# Patient Record
Sex: Male | Born: 1935
Health system: Southern US, Community
[De-identification: ages and names within clinical notes are randomized; demographics above are authoritative.]

## PROBLEM LIST (undated history)

## (undated) DIAGNOSIS — E079 Disorder of thyroid, unspecified: Secondary | ICD-10-CM

## (undated) DIAGNOSIS — K219 Gastro-esophageal reflux disease without esophagitis: Secondary | ICD-10-CM

## (undated) DIAGNOSIS — I4891 Unspecified atrial fibrillation: Secondary | ICD-10-CM

## (undated) DIAGNOSIS — M4800 Spinal stenosis, site unspecified: Secondary | ICD-10-CM

## (undated) DIAGNOSIS — M549 Dorsalgia, unspecified: Secondary | ICD-10-CM

## (undated) DIAGNOSIS — E785 Hyperlipidemia, unspecified: Secondary | ICD-10-CM

## (undated) DIAGNOSIS — I10 Essential (primary) hypertension: Secondary | ICD-10-CM

## (undated) HISTORY — PX: SALIVARY GLAND SURGERY: SHX768

## (undated) HISTORY — DX: Hyperlipidemia, unspecified: E78.5

## (undated) HISTORY — DX: Disorder of thyroid, unspecified: E07.9

## (undated) HISTORY — DX: Unspecified atrial fibrillation: I48.91

## (undated) HISTORY — PX: HERNIA REPAIR: SHX51

## (undated) HISTORY — PX: TONSILLECTOMY: SUR1361

## (undated) HISTORY — PX: COLONOSCOPY: SHX174

## (undated) HISTORY — DX: Dorsalgia, unspecified: M54.9

## (undated) HISTORY — PX: CARPAL TUNNEL RELEASE: SHX101

---

## 1978-10-18 HISTORY — PX: OTHER SURGICAL HISTORY: SHX169

## 2008-08-07 ENCOUNTER — Ambulatory Visit: Payer: Self-pay | Admitting: Family Medicine

## 2008-08-07 DIAGNOSIS — M5430 Sciatica, unspecified side: Secondary | ICD-10-CM | POA: Insufficient documentation

## 2008-08-07 DIAGNOSIS — H9190 Unspecified hearing loss, unspecified ear: Secondary | ICD-10-CM | POA: Insufficient documentation

## 2008-08-07 DIAGNOSIS — N400 Enlarged prostate without lower urinary tract symptoms: Secondary | ICD-10-CM | POA: Insufficient documentation

## 2008-08-07 DIAGNOSIS — I10 Essential (primary) hypertension: Secondary | ICD-10-CM | POA: Insufficient documentation

## 2008-08-08 ENCOUNTER — Encounter: Payer: Self-pay | Admitting: Family Medicine

## 2008-08-09 LAB — CONVERTED CEMR LAB
ALT: 16 units/L (ref 0–53)
Albumin: 4.5 g/dL (ref 3.5–5.2)
BUN: 21 mg/dL (ref 6–23)
Cholesterol, target level: 200 mg/dL
HDL: 58 mg/dL (ref >39)
PSA: 3.66 ng/mL (ref 0.10–4.00)
Potassium: 4.2 meq/L (ref 3.5–5.3)
Sodium: 141 meq/L (ref 135–145)
Total Protein: 7.3 g/dL (ref 6.0–8.3)
Triglycerides: 128 mg/dL (ref <150)
VLDL: 26 mg/dL (ref 0–40)

## 2008-08-26 ENCOUNTER — Encounter: Payer: Self-pay | Admitting: Family Medicine

## 2008-09-16 ENCOUNTER — Ambulatory Visit: Payer: Self-pay | Admitting: Family Medicine

## 2008-10-21 ENCOUNTER — Telehealth: Payer: Self-pay | Admitting: Family Medicine

## 2008-11-13 ENCOUNTER — Ambulatory Visit: Payer: Self-pay | Admitting: Family Medicine

## 2008-12-05 ENCOUNTER — Encounter: Payer: Self-pay | Admitting: Family Medicine

## 2008-12-09 LAB — CONVERTED CEMR LAB
Calcium: 9.3 mg/dL (ref 8.4–10.5)
Chloride: 105 meq/L (ref 96–112)
Creatinine, Ser: 1.36 mg/dL (ref 0.40–1.50)
Glucose, Bld: 81 mg/dL (ref 70–99)

## 2008-12-11 ENCOUNTER — Encounter: Payer: Self-pay | Admitting: Family Medicine

## 2009-02-06 ENCOUNTER — Telehealth (INDEPENDENT_AMBULATORY_CARE_PROVIDER_SITE_OTHER): Payer: Self-pay | Admitting: *Deleted

## 2009-02-20 ENCOUNTER — Telehealth: Payer: Self-pay | Admitting: Family Medicine

## 2009-08-26 ENCOUNTER — Encounter: Payer: Self-pay | Admitting: Family Medicine

## 2009-10-07 ENCOUNTER — Ambulatory Visit: Payer: Self-pay | Admitting: Family Medicine

## 2009-10-07 DIAGNOSIS — L821 Other seborrheic keratosis: Secondary | ICD-10-CM | POA: Insufficient documentation

## 2010-03-12 ENCOUNTER — Encounter: Payer: Self-pay | Admitting: Family Medicine

## 2010-03-17 ENCOUNTER — Telehealth (INDEPENDENT_AMBULATORY_CARE_PROVIDER_SITE_OTHER): Payer: Self-pay | Admitting: *Deleted

## 2010-03-26 ENCOUNTER — Encounter: Payer: Self-pay | Admitting: Family Medicine

## 2010-04-07 DIAGNOSIS — I2789 Other specified pulmonary heart diseases: Secondary | ICD-10-CM | POA: Insufficient documentation

## 2010-04-10 ENCOUNTER — Ambulatory Visit: Payer: Self-pay | Admitting: Family Medicine

## 2010-04-10 DIAGNOSIS — R55 Syncope and collapse: Secondary | ICD-10-CM | POA: Insufficient documentation

## 2010-04-10 DIAGNOSIS — M719 Bursopathy, unspecified: Secondary | ICD-10-CM

## 2010-04-10 DIAGNOSIS — K219 Gastro-esophageal reflux disease without esophagitis: Secondary | ICD-10-CM | POA: Insufficient documentation

## 2010-04-10 DIAGNOSIS — M67919 Unspecified disorder of synovium and tendon, unspecified shoulder: Secondary | ICD-10-CM | POA: Insufficient documentation

## 2010-04-30 ENCOUNTER — Encounter: Payer: Self-pay | Admitting: Family Medicine

## 2010-05-01 LAB — CONVERTED CEMR LAB
AST: 16 units/L (ref 0–37)
Albumin: 4.1 g/dL (ref 3.5–5.2)
CO2: 23 meq/L (ref 19–32)
Calcium: 8.9 mg/dL (ref 8.4–10.5)
Chloride: 106 meq/L (ref 96–112)
Cholesterol: 196 mg/dL (ref 0–200)
Glucose, Bld: 99 mg/dL (ref 70–99)
LDL Cholesterol: 122 mg/dL — ABNORMAL HIGH (ref 0–99)
Potassium: 3.9 meq/L (ref 3.5–5.3)
Total Bilirubin: 0.6 mg/dL (ref 0.3–1.2)
Total CHOL/HDL Ratio: 3.8

## 2010-08-24 ENCOUNTER — Ambulatory Visit: Payer: Self-pay | Admitting: Family Medicine

## 2010-09-24 ENCOUNTER — Telehealth: Payer: Self-pay | Admitting: Family Medicine

## 2010-09-29 ENCOUNTER — Encounter (INDEPENDENT_AMBULATORY_CARE_PROVIDER_SITE_OTHER): Payer: Self-pay | Admitting: *Deleted

## 2010-10-08 ENCOUNTER — Encounter
Admission: RE | Admit: 2010-10-08 | Discharge: 2010-10-08 | Payer: Self-pay | Source: Home / Self Care | Attending: Sports Medicine | Admitting: Sports Medicine

## 2010-10-21 ENCOUNTER — Encounter: Admit: 2010-10-21 | Payer: Self-pay | Admitting: Sports Medicine

## 2010-11-17 NOTE — Assessment & Plan Note (Signed)
Summary: Sciatica   Vital Signs:  Patient profile:   77 year old male Height:      67 inches Weight:      229 pounds Pulse rate:   60 / minute BP sitting:   106 / 69  (right arm) Cuff size:   large  Vitals Entered By: Avon Gully CMA, Duncan Dull) (August 24, 2010 1:06 PM) CC: back pain x 4 days   Primary Care Provider:  Nani Gasser, MD  CC:  back pain x 4 days.  History of Present Illness: Had been lifting boxes, moving and then notices pain  No sudden onset of pain.  Doesn't remember a specific injury but starting while moving boxes. ONly some mild discomfort in his low back.  Says that is not really bothering him.  Says it is the pain in both buttocks that is bothersome. Says it is almost more comfortable to bend forward. Tooks some naprosyn but didn't help. Feels it is getting wrose. Had similar pain years ago. No hx of back surgery or fever.   Current Medications (verified): 1)  Flomax 0.4 Mg Xr24h-Cap (Tamsulosin Hcl) .... Take 1 Tablet By Mouth Once A Day 2)  Lisinopril 2.5 Mg Tabs (Lisinopril) .... Take 1 Tab By Mouth Once Daily 3)  Naprosyn 500 Mg Tabs (Naproxen) .Marland Kitchen.. 1 Tab By Mouth Two Times A Day With Food X 2 Wks  Allergies (verified): No Known Drug Allergies  Comments:  Nurse/Medical Assistant: The patient's medications and allergies were reviewed with the patient and were updated in the Medication and Allergy Lists. Avon Gully CMA, Duncan Dull) (August 24, 2010 1:07 PM)  Past History:  Past Surgical History: Last updated: 08/07/2008 Right Thumb  trigger finger 1973.    Physical Exam  General:  Well-developed,well-nourished,in no acute distress; alert,appropriate and cooperative throughout examination Head:  Normocephalic and atraumatic without obvious abnormalities. No apparent alopecia or balding. Msk:  Normal lumbar flexion, extension, side bending , and rotation right and left.   Nontender lumbar spine. Neg straight leg raise bilat. TEnder  over both buttock bilaterally. hip, knee and ankle stregnth 5/5 bilat.  Extremities:  NO LE edema.  Skin:  no rashes.   Psych:  Cognition and judgment appear intact. Alert and cooperative with normal attention span and concentration. No apparent delusions, illusions, hallucinations   Impression & Recommendations:  Problem # 1:  SCIATICA (ICD-724.3) Assessment New  Likley from his low back, vs piriformis syndrome. Dsicussed exercises.  Given H.O. Offered course of oral steroids but he says he can't get his meds for 3 more days.  Given steroid IM injection for acute pain relief.   Then can restart the naprosyn.   REst as much as can and avoid heavy lifting. If not better in 2-3 weeks then follow up.  The following medications were removed from the medication list:    Naprosyn 500 Mg Tabs (Naproxen) .Marland Kitchen... 1 tab by mouth two times a day with food x 2 wks His updated medication list for this problem includes:    Naprosyn 500 Mg Tabs (Naproxen) .Marland Kitchen... Take 1 tablet by mouth two times a day as needed for back pain  Orders: Depo- Medrol 80mg  (J1040) Ketorolac-Toradol 15mg  (G9562) Admin of Therapeutic Inj  intramuscular or subcutaneous (13086)  Complete Medication List: 1)  Terazosin Hcl 1 Mg Caps (Terazosin hcl) .... Take 1 tablet by mouth once a day 2)  Lisinopril 2.5 Mg Tabs (Lisinopril) .... Take 1 tab by mouth once daily 3)  Naprosyn 500  Mg Tabs (Naproxen) .... Take 1 tablet by mouth two times a day as needed for back pain  Patient Instructions: 1)  Then can restart the naprosyn in about 3 days.   2)  REst as much as can and avoid heavy lifting. 3)   If not better in 2-3 weeks then follow up.  4)  Review the handout and start the stretching exercises only.  Prescriptions: NAPROSYN 500 MG TABS (NAPROXEN) Take 1 tablet by mouth two times a day as needed for back pain  #60 x 0   Entered and Authorized by:   Nani Gasser MD   Signed by:   Nani Gasser MD on 08/24/2010    Method used:   Electronically to        [facility]  [id number] (retail)       [address]       [place], [place]  [address]       Ph: [phone]       Fax: [phone]   RxID:   [id number] TERAZOSIN HCL 1 MG CAPS (TERAZOSIN HCL) Take 1 tablet by mouth once a day  #30 x 0   Entered and Authorized by:   Nani Gasser MD   Signed by:   Nani Gasser MD on 08/24/2010   Method used:   Electronically to        [facility]  [address] [phone]* (retail)       [address]       [place], [place]  [address]       Ph: [phone]       Fax: [phone]   RxID:   [id number]    Medication Administration  Injection # 1:    Medication: Depo- Medrol 80mg     Diagnosis: SCIATICA (ICD-724.3)    Route: IM    Site: RUOQ gluteus    Exp Date: 02/16/2011    Lot #: Dia [id number]    Mfr: Pharmacia    Patient tolerated injection without complications    Given by: Avon Gully CMA, Duncan Dull) (August 24, 2010 2:50 PM)  Injection # 2:    Medication: Ketorolac-Toradol 15mg     Diagnosis: SCIATICA (ICD-724.3)    Route: IM    Site: LUOQ gluteus    Exp Date: 12/17/2011    Lot #: [id number]    Mfr: hospira    Patient tolerated injection without complications    Given by: Avon Gully CMA, Duncan Dull) (August 24, 2010 2:51 PM)  Orders Added: 1)  Est. Patient Level IV [54270] 2)  Depo- Medrol 80mg  [J1040] 3)  Ketorolac-Toradol 15mg  [J1885] 4)  Admin of Therapeutic Inj  intramuscular or subcutaneous [96372]    Flex Sig Next Due:  Not Indicated Hemoccult Next Due:  Not Indicated

## 2010-11-17 NOTE — Letter (Signed)
Summary: [facility]  [facility]   Imported By: Lanelle Bal 03/23/2010 09:10:16  _____________________________________________________________________  External Attachment:    Type:   Image     Comment:   External Document

## 2010-11-17 NOTE — Progress Notes (Signed)
Summary: pt. has a question?  Phone Note Call from Patient   Caller: Patient Summary of Call: Please call patient back.... He has a question? Call [phone] Initial call taken by: Michaelle Copas,  Mar 17, 2010 10:22 AM  Follow-up for Phone Call        Pt wanted to make sure Echo results will be sent here. I advised Pt to give [facility] hosp Dr. Shelah Lewandowsky name and request any records to be sent here. Also advised Pt to scedule OV after stress testing or sooner if feeling worse. Pt agreed. Follow-up by: Payton Spark CMA,  Mar 17, 2010 2:59 PM

## 2010-11-17 NOTE — Progress Notes (Signed)
Summary: Back pain worse  Phone Note Call from Patient Call back at [phone]   Caller: Patient Call For: Nani Gasser MD Summary of Call: His back pain is worse- states he can't hardly walk today. Pain med given to him doesn't help and makes him sick. Please advise Initial call taken by: Kathlene November LPN,  September 24, 2010 2:15 PM  Follow-up for Phone Call        Can refer to ortho.  Follow-up by: Nani Gasser MD,  September 24, 2010 2:43 PM  Additional Follow-up for Phone Call Additional follow up Details #1::        Pt notified. KJ LPN Additional Follow-up by: Kathlene November LPN,  September 24, 2010 3:20 PM

## 2010-11-17 NOTE — Letter (Signed)
Summary: [facility]  [facility]   Imported By: Lanelle Bal 03/24/2010 11:51:59  _____________________________________________________________________  External Attachment:    Type:   Image     Comment:   External Document

## 2010-11-17 NOTE — Assessment & Plan Note (Signed)
Summary: HFU visit   Vital Signs:  Patient profile:   77 year old male Height:      67 inches Weight:      231 pounds BMI:     36.31 O2 Sat:      96 % on Room air Pulse rate:   63 / minute BP sitting:   127 / 63  (left arm) Cuff size:   large  Vitals Entered By: Payton Spark CMA (April 10, 2010 3:00 PM)  O2 Flow:  Room air CC: F/U.    Primary Care Provider:  Nani Gasser, MD  CC:  F/U. Marland Kitchen  History of Present Illness: 77 yo WM presents for HFU visit.    Admitted to [facility] 2 wks ago for a syncopal episode due to vasovagal cause.  He had hiccups and regurgitated which caused his syncope.  He ruled out for AMI and had a normal stress test.  He was changed from Toprol to Lisinopril due to bradycardia into the 40s.  Feeling fine now.  Has reflux but is not taking meds for it.  Has some heartburn but no abd pain, wt loss, nausea, melena or blood in the stool.  Denies use of NSAIDs or ETOH.  Also having L shoulder pain x 2 wks with full flexion, internal rotation, etc.  Denies trauma or hx of problems.    Due for RF of meds and labs.  Current Medications (verified): 1)  Flomax 0.4 Mg Xr24h-Cap (Tamsulosin Hcl) .... Take 1 Tablet By Mouth Once A Day 2)  Lisinopril 2.5 Mg Tabs (Lisinopril) .... Take 1 Tab By Mouth Once Daily  Allergies (verified): No Known Drug Allergies  Past History:  Past Medical History: Reviewed history from 04/07/2010 and no changes required. HTN 03-26-10 normal nuc stress test  Social History: Reviewed history from 08/07/2008 and no changes required. 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  Married to [place] with 2 adult kids.   Never Smoked Alcohol use-yes Drug use-no Regular exercise-no  Review of Systems      See HPI  Physical Exam  General:  obese WM in NAD Head:  normocephalic, atraumatic, and male-pattern balding.   Eyes:  pupils equal, pupils round, and pupils reactive to light.   Mouth:  pharynx pink and moist.     Neck:  thick neck circumf. Lungs:  normal respiratory effort, no intercostal retractions, no accessory muscle use, and normal breath sounds.   Heart:  RRR w/o M Abdomen:  soft and non-tender.   Msk:  slightly limited L shoulder flexion and int rotation.  Neg empty can and Hawkins test. Extremities:  no LE edema Neurologic:  grip + 5/5 bilat Skin:  color normal.   Cervical Nodes:  No lymphadenopathy noted Psych:  good eye contact, not anxious appearing, and not depressed appearing.     Impression & Recommendations:  Problem # 1:  HYPERTENSION, BENIGN (ICD-401.1) BP looks great.  RFd medication and lab order printed. His updated medication list for this problem includes:    Lisinopril 2.5 Mg Tabs (Lisinopril) .Marland Kitchen... Take 1 tab by mouth once daily  Orders: T-Comprehensive Metabolic Panel ([id number])  BP today: 127/63 Prior BP: 144/75 (10/07/2009)  Prior 10 Yr Risk Heart Disease: 22 % (08/09/2008)  Labs Reviewed: K+: 4.3 (12/05/2008) Creat: : 1.36 (12/05/2008)   Chol: 210 (08/08/2008)   HDL: 58 (08/08/2008)   LDL: 126 (08/08/2008)   TG: 128 (08/08/2008)  Problem # 2:  ESOPHAGEAL REFLUX (ICD-530.81) Start Dexilant.  He is  obese and his hx of hiccups while eating is c/w acid reflux.  No red flags (epigatric pain, melena, ETOH or NSAID use, wt loss).  Will see how he does on reflux precautions and Dexilant.  If not resolved, will get him in with GI for an EGD. His updated medication list for this problem includes:    Dexilant 60 Mg Cpdr (Dexlansoprazole) .Marland Kitchen... 1 capsule by mouth daily  Problem # 3:  BURSITIS, LEFT SHOULDER (ICD-726.10) Early frozen shoulder on the L from shoulder bursitis. Treat with Naprosyn with meals.  If not improved after 2 wks of meds, will get an Xray and may need a steroid injection.  Problem # 4:  VASOVAGAL SYNCOPE (ICD-780.2) Assessment: Improved Had a normal stress test.  Reviewed today as well as hosp discharge notes. Felt to be vasovagal from acid  reflux.    Complete Medication List: 1)  Flomax 0.4 Mg Xr24h-cap (Tamsulosin hcl) .... Take 1 tablet by mouth once a day 2)  Lisinopril 2.5 Mg Tabs (Lisinopril) .... Take 1 tab by mouth once daily 3)  Naprosyn 500 Mg Tabs (Naproxen) .Marland Kitchen.. 1 tab by mouth two times a day with food x 2 wks 4)  Dexilant 60 Mg Cpdr (Dexlansoprazole) .Marland Kitchen.. 1 capsule by mouth daily  Other Orders: T-Lipid Profile ([id number]) T-PSA Total (Medicare Screen Only) ([id number])  Patient Instructions: 1)  Stay on current meds. 2)  Add Naprosyn 1 tab with breakfast and dinner x 2 wks for inflammation in shoulder and lower back. 3)  Use samples of Dexilant once a day for acid reflux. 4)  Avoid alcohol and large meals, spicy foods, late night eating. 5)  If this is not improved in 2 wks, will get you in with GI. 6)  Update fasting labs. 7)  F/U L shoulder bursitis in 2 mos. Prescriptions: NAPROSYN 500 MG TABS (NAPROXEN) 1 tab by mouth two times a day with food x 2 wks  #28 x 0   Entered and Authorized by:   Seymour Bars DO   Signed by:   Seymour Bars DO on 04/10/2010   Method used:   Electronically to        [facility]  [id number] (retail)       [address]       [place], [place]  [address]       Ph: [phone]       Fax: [phone]   RxID:   [id number] LISINOPRIL 2.5 MG TABS (LISINOPRIL) Take 1 tab by mouth once daily  #30 x 5   Entered and Authorized by:   Seymour Bars DO   Signed by:   Seymour Bars DO on 04/10/2010   Method used:   Electronically to        [facility]  [address] [phone]* (retail)       [address]       [place], [place]  [address]       Ph: [phone]       Fax: [phone]   RxID:   [id number] FLOMAX 0.4 MG XR24H-CAP (TAMSULOSIN HCL) Take 1 tablet by mouth once a day  #30 x 5   Entered and Authorized by:   Seymour Bars DO   Signed by:   Seymour Bars DO on 04/10/2010   Method used:   Electronically to        [facility]  [id number] (retail)       [address]  [place], [place]  [address]       Ph: [phone]       Fax: [phone]   RxID:   [id number]   Appended Document: HFU visit     Immunization History:  Influenza Immunization History:    Influenza:  historical (07/11/2010) Given at [facility].  Faxed copy.

## 2010-11-19 NOTE — Letter (Signed)
Summary: Primary Care Consult Scheduled Letter  [facility] [place]  [address], [address]   [place], [place] [address]   Phone: [phone]  Fax: [phone]      09/29/2010 MRN: [id number]  ESTILL LLERENA [address] [place], [place]  [address]    Dear Mr. Graciela Husbands,  We have scheduled an appointment for you.  At the recommendation of Dr.Metheney, we have scheduled you a consult with Orthopedic- Dr. Margaretha Sheffield on 10/08/10 at 1:30.  Their located in the same building as [place], but they are in [address] downstairs. The office phone number is [phone].  If this appointment day and time is not convenient for you, please feel free to call the office of the doctor you are being referred to at the number listed above and reschedule the appointment.     It is important for you to keep your scheduled appointments. We are here to make sure you are given good patient care.    Thank you, Michaelle Copas [phone] Patient Care Coordinator [facility] Kathryne Sharper

## 2011-01-29 ENCOUNTER — Other Ambulatory Visit: Payer: Self-pay | Admitting: Family Medicine

## 2011-02-02 NOTE — Telephone Encounter (Signed)
OK to refill

## 2011-02-02 NOTE — Telephone Encounter (Signed)
Pt was changed to Hytrin at 08/2010 visit.  No follow up since.  Is it OK to fill?  Does pt need follow up appt/labs?

## 2011-02-03 ENCOUNTER — Other Ambulatory Visit: Payer: Self-pay | Admitting: Family Medicine

## 2011-02-03 MED ORDER — TERAZOSIN HCL 1 MG PO CAPS: 1.0000 mg | ORAL_CAPSULE | Freq: Every day | ORAL | Status: DC

## 2011-03-13 ENCOUNTER — Other Ambulatory Visit: Payer: Self-pay | Admitting: Family Medicine

## 2011-05-31 ENCOUNTER — Emergency Department (HOSPITAL_COMMUNITY)
Admission: EM | Admit: 2011-05-31 | Discharge: 2011-05-31 | Disposition: A | Discharge status: Home / Self Care | Payer: Medicare PPO | Attending: Emergency Medicine | Admitting: Emergency Medicine

## 2011-05-31 ENCOUNTER — Emergency Department (HOSPITAL_COMMUNITY): Payer: Medicare PPO

## 2011-05-31 DIAGNOSIS — I10 Essential (primary) hypertension: Secondary | ICD-10-CM | POA: Insufficient documentation

## 2011-05-31 DIAGNOSIS — R112 Nausea with vomiting, unspecified: Secondary | ICD-10-CM | POA: Insufficient documentation

## 2011-05-31 DIAGNOSIS — R55 Syncope and collapse: Secondary | ICD-10-CM | POA: Insufficient documentation

## 2011-05-31 DIAGNOSIS — I517 Cardiomegaly: Secondary | ICD-10-CM | POA: Insufficient documentation

## 2011-05-31 DIAGNOSIS — R066 Hiccough: Secondary | ICD-10-CM | POA: Insufficient documentation

## 2011-05-31 DIAGNOSIS — N289 Disorder of kidney and ureter, unspecified: Secondary | ICD-10-CM | POA: Insufficient documentation

## 2011-05-31 LAB — POCT I-STAT, CHEM 8
Calcium, Ion: 1.14 mmol/L (ref 1.12–1.32)
Chloride: 107 mEq/L (ref 96–112)
Glucose, Bld: 103 mg/dL — ABNORMAL HIGH (ref 70–99)
Potassium: 4.1 mEq/L (ref 3.5–5.1)
Sodium: 141 mEq/L (ref 135–145)
TCO2: 25 mmol/L (ref 0–100)

## 2011-05-31 LAB — CBC
HCT: 43.5 % (ref 39.0–52.0)
MCH: 30.3 pg (ref 26.0–34.0)
MCHC: 37 g/dL — ABNORMAL HIGH (ref 30.0–36.0)
MCV: 81.8 fL (ref 78.0–100.0)
Platelets: 180 10*3/uL (ref 150–400)
WBC: 8.3 10*3/uL (ref 4.0–10.5)

## 2011-05-31 LAB — DIFFERENTIAL
Basophils Absolute: 0 10*3/uL (ref 0.0–0.1)
Lymphs Abs: 1.5 10*3/uL (ref 0.7–4.0)
Monocytes Absolute: 0.7 10*3/uL (ref 0.1–1.0)

## 2011-10-19 HISTORY — PX: PROSTATE SURGERY: SHX751

## 2011-10-19 HISTORY — PX: HERNIA REPAIR: SHX51

## 2011-12-13 ENCOUNTER — Other Ambulatory Visit: Payer: Self-pay | Admitting: Urology

## 2011-12-23 ENCOUNTER — Encounter (HOSPITAL_COMMUNITY): Payer: Self-pay | Admitting: Pharmacy Technician

## 2011-12-27 ENCOUNTER — Encounter (HOSPITAL_COMMUNITY)
Admission: RE | Admit: 2011-12-27 | Discharge: 2011-12-27 | Disposition: A | Discharge status: Home / Self Care | Payer: Medicare Other | Source: Ambulatory Visit | Attending: Urology | Admitting: Urology

## 2011-12-27 ENCOUNTER — Encounter (HOSPITAL_COMMUNITY): Payer: Self-pay

## 2011-12-27 HISTORY — DX: Spinal stenosis, site unspecified: M48.00

## 2011-12-27 HISTORY — DX: Gastro-esophageal reflux disease without esophagitis: K21.9

## 2011-12-27 HISTORY — DX: Essential (primary) hypertension: I10

## 2011-12-27 LAB — BASIC METABOLIC PANEL
BUN: 20 mg/dL (ref 6–23)
CO2: 29 mEq/L (ref 19–32)
Glucose, Bld: 94 mg/dL (ref 70–99)
Potassium: 3.8 mEq/L (ref 3.5–5.1)
Sodium: 141 mEq/L (ref 135–145)

## 2011-12-27 LAB — CBC
HCT: 44.5 % (ref 39.0–52.0)
Hemoglobin: 16 g/dL (ref 13.0–17.0)
MCH: 30 pg (ref 26.0–34.0)
RBC: 5.34 MIL/uL (ref 4.22–5.81)

## 2011-12-27 NOTE — Patient Instructions (Addendum)
20 Glenn Ortega  12/27/2011      Your procedure is scheduled on:  12/30/11  Thursday   Surgery [id number]  Report to [facility] at    0600   AM.  Call this number if you have problems the morning of surgery: [phone]     Or PST   [id number]  [facility]   Remember:   Do not eat food:After Midnight. Wednesday night  May have clear liquids: until midnight Wednesday night  Clear liquids include soda, tea, black coffee, apple or grape juice, broth.  Take these medicines the morning of surgery with A SIP OF WATER:none   Do not wear jewelry, make-up or nail polish.  Do not wear lotions, powders, or perfumes. You may wear deodorant.  Do not shave 48 hours prior to surgery.  Do not bring valuables to the hospital.  Contacts, dentures or bridgework may not be worn into surgery.  Leave suitcase in the car. After surgery it may be brought to your room.  For patients admitted to the hospital, checkout time is 11:00 AM the day of discharge.   Patients discharged the day of surgery will not be allowed to drive home.  Name and phone number of your driver:   wife                                                                   Special Instructions: CHG Shower Use Special Wash: 1/2 bottle night before surgery and 1/2 bottle morning of surgery. REGULAR SOAP FACE AND PRIVATES                            MEN-MAY SHAVE FACE MORNING OF SURGERY  Please read over the following fact sheets that you were given: MRSA Information

## 2011-12-27 NOTE — Progress Notes (Signed)
12/27/11 0934  OBSTRUCTIVE SLEEP APNEA  Have you ever been diagnosed with sleep apnea through a sleep study? No  Do you snore loudly (loud enough to be heard through closed doors)?  0  Do you often feel tired, fatigued, or sleepy during the daytime? 0  Has anyone observed you stop breathing during your sleep? 0  Do you have, or are you being treated for high blood pressure? 1  BMI more than 35 kg/m2? 1  Age over 76 years old? 1  Gender: 1  Obstructive Sleep Apnea Score 4   Score 4 or greater  Updated health history;Results sent to PCP

## 2011-12-28 MED ORDER — ACETAMINOPHEN 10 MG/ML IV SOLN
INTRAVENOUS | Status: AC
  Filled 2011-12-28: qty 100

## 2011-12-28 MED ORDER — CEFAZOLIN SODIUM-DEXTROSE 2-3 GM-% IV SOLR
INTRAVENOUS | Status: AC
  Filled 2011-12-28: qty 50

## 2011-12-29 NOTE — Pre-Procedure Instructions (Signed)
Late entry for 12/27/11-  Left voice mail with Lossie Faes at [facility] for verification of pre op Ancef as is allergic to Penicillin.  12/29/11 Received task per [facility]/Pam  That Dr Sherron Monday ordered to cancel ANCEF and adm Natasha Bence only- placed on chart. Ancef cancelled in orders

## 2011-12-30 ENCOUNTER — Encounter (HOSPITAL_COMMUNITY): Payer: Self-pay | Admitting: Anesthesiology

## 2011-12-30 ENCOUNTER — Ambulatory Visit (HOSPITAL_COMMUNITY): Payer: Medicare Other | Admitting: Anesthesiology

## 2011-12-30 ENCOUNTER — Encounter (HOSPITAL_COMMUNITY)
Admission: RE | Disposition: A | Discharge status: Home / Self Care | Payer: Self-pay | Source: Ambulatory Visit | Attending: Urology

## 2011-12-30 ENCOUNTER — Encounter (HOSPITAL_COMMUNITY): Payer: Self-pay | Admitting: *Deleted

## 2011-12-30 ENCOUNTER — Ambulatory Visit (HOSPITAL_COMMUNITY)
Admission: RE | Admit: 2011-12-30 | Discharge: 2011-12-30 | Disposition: A | Discharge status: Home / Self Care | Payer: Medicare Other | Source: Ambulatory Visit | Attending: Urology | Admitting: Urology

## 2011-12-30 DIAGNOSIS — Z01812 Encounter for preprocedural laboratory examination: Secondary | ICD-10-CM | POA: Insufficient documentation

## 2011-12-30 DIAGNOSIS — Z79899 Other long term (current) drug therapy: Secondary | ICD-10-CM | POA: Insufficient documentation

## 2011-12-30 DIAGNOSIS — K219 Gastro-esophageal reflux disease without esophagitis: Secondary | ICD-10-CM | POA: Insufficient documentation

## 2011-12-30 DIAGNOSIS — N35919 Unspecified urethral stricture, male, unspecified site: Secondary | ICD-10-CM | POA: Insufficient documentation

## 2011-12-30 DIAGNOSIS — N401 Enlarged prostate with lower urinary tract symptoms: Secondary | ICD-10-CM | POA: Insufficient documentation

## 2011-12-30 DIAGNOSIS — N3941 Urge incontinence: Secondary | ICD-10-CM | POA: Insufficient documentation

## 2011-12-30 DIAGNOSIS — E7800 Pure hypercholesterolemia, unspecified: Secondary | ICD-10-CM | POA: Insufficient documentation

## 2011-12-30 DIAGNOSIS — N138 Other obstructive and reflux uropathy: Secondary | ICD-10-CM | POA: Insufficient documentation

## 2011-12-30 DIAGNOSIS — I10 Essential (primary) hypertension: Secondary | ICD-10-CM | POA: Insufficient documentation

## 2011-12-30 HISTORY — PX: CYSTOSCOPY: SHX5120

## 2011-12-30 SURGERY — CYSTOSCOPY
Anesthesia: General | Wound class: Clean Contaminated

## 2011-12-30 MED ORDER — BELLADONNA ALKALOIDS-OPIUM 16.2-60 MG RE SUPP
RECTAL | Status: DC | PRN
  Administered 2011-12-30: 1 via RECTAL

## 2011-12-30 MED ORDER — FENTANYL CITRATE 0.05 MG/ML IJ SOLN
INTRAMUSCULAR | Status: DC | PRN
  Administered 2011-12-30 (×2): 50 ug via INTRAVENOUS

## 2011-12-30 MED ORDER — KETOROLAC TROMETHAMINE 30 MG/ML IJ SOLN: 15.0000 mg | Freq: Once | INTRAMUSCULAR | Status: DC | PRN

## 2011-12-30 MED ORDER — PROMETHAZINE HCL 25 MG/ML IJ SOLN: 6.2500 mg | INTRAMUSCULAR | Status: DC | PRN

## 2011-12-30 MED ORDER — PROPOFOL 10 MG/ML IV BOLUS
INTRAVENOUS | Status: DC | PRN
  Administered 2011-12-30: 180 mg via INTRAVENOUS

## 2011-12-30 MED ORDER — ONDANSETRON HCL 4 MG/2ML IJ SOLN
INTRAMUSCULAR | Status: DC | PRN
  Administered 2011-12-30: 4 mg via INTRAVENOUS

## 2011-12-30 MED ORDER — ACETAMINOPHEN 10 MG/ML IV SOLN
INTRAVENOUS | Status: DC | PRN
  Administered 2011-12-30: 1000 mg via INTRAVENOUS

## 2011-12-30 MED ORDER — HYDROCODONE-ACETAMINOPHEN 5-325 MG PO TABS: 1.0000 | ORAL_TABLET | Freq: Four times a day (QID) | ORAL | Status: DC | PRN

## 2011-12-30 MED ORDER — HYDROMORPHONE HCL PF 1 MG/ML IJ SOLN: 0.2500 mg | INTRAMUSCULAR | Status: DC | PRN

## 2011-12-30 MED ORDER — LACTATED RINGERS IV SOLN
INTRAVENOUS | Status: DC | PRN
  Administered 2011-12-30: 08:00:00 via INTRAVENOUS

## 2011-12-30 MED ORDER — IOHEXOL 300 MG/ML  SOLN
INTRAMUSCULAR | Status: AC
  Filled 2011-12-30: qty 1

## 2011-12-30 MED ORDER — GENTAMICIN IN SALINE 1.6-0.9 MG/ML-% IV SOLN
INTRAVENOUS | Status: AC
  Filled 2011-12-30: qty 50

## 2011-12-30 MED ORDER — LIDOCAINE HCL (CARDIAC) 20 MG/ML IV SOLN
INTRAVENOUS | Status: DC | PRN
  Administered 2011-12-30: 100 mg via INTRAVENOUS

## 2011-12-30 MED ORDER — BELLADONNA ALKALOIDS-OPIUM 16.2-60 MG RE SUPP
RECTAL | Status: AC
  Filled 2011-12-30: qty 1

## 2011-12-30 MED ORDER — ACETAMINOPHEN 10 MG/ML IV SOLN
INTRAVENOUS | Status: AC
  Filled 2011-12-30: qty 100

## 2011-12-30 MED ORDER — SODIUM CHLORIDE 0.9 % IR SOLN
Status: DC | PRN
  Administered 2011-12-30: 3000 mL

## 2011-12-30 MED ORDER — LACTATED RINGERS IV SOLN
INTRAVENOUS | Status: DC
  Administered 2011-12-30: 11:00:00 via INTRAVENOUS

## 2011-12-30 MED ORDER — HYDROCODONE-ACETAMINOPHEN 5-500 MG PO TABS: 1.0000 | ORAL_TABLET | Freq: Four times a day (QID) | ORAL | Status: AC | PRN

## 2011-12-30 MED ORDER — CIPROFLOXACIN HCL 250 MG PO TABS: 250.0000 mg | ORAL_TABLET | Freq: Two times a day (BID) | ORAL | Status: AC

## 2011-12-30 MED ORDER — DIATRIZOATE MEGLUMINE 30 % UR SOLN
URETHRAL | Status: DC | PRN
  Administered 2011-12-30: 300 mL

## 2011-12-30 MED ORDER — LIDOCAINE HCL 2 % EX GEL
CUTANEOUS | Status: AC
  Filled 2011-12-30: qty 10

## 2011-12-30 MED ORDER — GENTAMICIN IN SALINE 1.6-0.9 MG/ML-% IV SOLN: 80.0000 mg | Freq: Once | INTRAVENOUS | Status: DC

## 2011-12-30 MED ORDER — EPHEDRINE SULFATE 50 MG/ML IJ SOLN
INTRAMUSCULAR | Status: DC | PRN
  Administered 2011-12-30: 5 mg via INTRAVENOUS

## 2011-12-30 MED ORDER — HYDROCODONE-ACETAMINOPHEN 5-325 MG PO TABS
ORAL_TABLET | ORAL | Status: AC
  Administered 2011-12-30: 1
  Filled 2011-12-30: qty 1

## 2011-12-30 MED ORDER — GENTAMICIN IN SALINE 1.6-0.9 MG/ML-% IV SOLN
INTRAVENOUS | Status: DC | PRN
  Administered 2011-12-30: 80 mg via INTRAVENOUS

## 2011-12-30 SURGICAL SUPPLY — 28 items
BAG URINE DRAINAGE (UROLOGICAL SUPPLIES) ×2 IMPLANT
BAG URO CATCHER STRL LF (DRAPE) ×2 IMPLANT
BLADE SURG 15 STRL LF DISP TIS (BLADE) IMPLANT
BLADE SURG 15 STRL SS (BLADE)
CATH FOLEY 3WAY 30CC 24FR (CATHETERS) ×1
CATH URO 16X24FR 3W FL PS (CATHETERS) ×1 IMPLANT
CLOTH BEACON ORANGE TIMEOUT ST (SAFETY) ×2 IMPLANT
DRAPE CAMERA CLOSED 9X96 (DRAPES) ×2 IMPLANT
ELECT REM PT RETURN 9FT ADLT (ELECTROSURGICAL) ×2
ELECTRODE KNIFE URO 27FR PED (UROLOGICAL SUPPLIES) ×2 IMPLANT
ELECTRODE REM PT RTRN 9FT ADLT (ELECTROSURGICAL) ×1 IMPLANT
GLOVE BIOGEL M STRL SZ7.5 (GLOVE) ×2 IMPLANT
GOWN PREVENTION PLUS XLARGE (GOWN DISPOSABLE) ×2 IMPLANT
GOWN STRL REIN XL XLG (GOWN DISPOSABLE) ×2 IMPLANT
HOLDER FOLEY CATH W/STRAP (MISCELLANEOUS) ×2 IMPLANT
JUMPSUIT BLUE BOOT COVER DISP (PROTECTIVE WEAR) ×2 IMPLANT
KIT ASPIRATION TUBING (SET/KITS/TRAYS/PACK) ×2 IMPLANT
KNIFE COLLINS 24FR (ELECTROSURGICAL) IMPLANT
LOOPS RESECTOSCOPE DISP (ELECTROSURGICAL) ×2 IMPLANT
MANIFOLD NEPTUNE II (INSTRUMENTS) ×2 IMPLANT
NEEDLE INJECT RIGID (NEEDLE) IMPLANT
NS IRRIG 1000ML POUR BTL (IV SOLUTION) ×2 IMPLANT
PACK CYSTO (CUSTOM PROCEDURE TRAY) ×2 IMPLANT
ROLLER BALL 3MM 27FR (ELECTROSURGICAL) IMPLANT
SUT ETHILON 3 0 PS 1 (SUTURE) IMPLANT
SYR 30ML LL (SYRINGE) ×2 IMPLANT
SYRINGE IRR TOOMEY STRL 70CC (SYRINGE) ×2 IMPLANT
TUBING CONNECTING 10 (TUBING) ×2 IMPLANT

## 2011-12-30 NOTE — Op Note (Signed)
Preoperative diagnosis: Lower urinary tract symptoms secondary to benign prostatic hyperplasia Postoperative diagnosis: Lower urinary tract symptoms secondary to benign prostatic hyperplasia; significant intra-vesicle component and meatal stenosis  Surgery: Cystoscopy; urethral dilation; cystogram Surgeon: Dr. Alfredo Martinez  The patient has symptomatic lower he tract symptoms secondary to the above diagnoses. He consented to the above procedure. He was given preoperative antibiotics.  Initially I used a 22 [place] cystoscope with 12 lens and cystoscoped the patient. He had meatal stenosis not allowing the scope to be inserted well-lubricated. The meatus visually was mildly narrow at approximately 14 [place]. It was dilated sequentially and carefully with male sounds to 64 [place] not inserting the male sounds more than approximately 2 inches into the distal urethra. There is minimal meatal bleeding  I then cystoscoped the patient and it penile and bulbar urethra were normal. The verumontanum was identified. His prostatic urethra was approxi-2-1/2 and 2 m in length and he had an intra-vesicle component or middle lobe from the outside it was difficult to fill the bladder and he once again had grade 3/4 bladder trabeculation. I could not see the trigone at this stage so I decided to insert the resectoscope  The resectoscope with the obturator in place was delivered to the bulbar urethra. I then tried to inserted under direct endoscopic view. The scope did not want to passed to the proximal bulbar urethra in spite of gentle pressure visually he did not have a significant narrowing in this area. I could not put the penis on stretch because the scope otherwise without a bit long enough. I tried to gently dilate him with male sounds with the usual technique at this point but this would not easily go into the bladder which did not surprise me because of the presumed smaller bladder capacity an intravesical  component of the middle lobe.  I then re\re cystoscoped the patient with a 30 lens. For the second time he was ashy difficulty into the bladder with the cystoscope. On both occasions I actually passed the catheter and into the bladder thinking that he may been overdistended but he didn't appear to be overdistended clinically or by palpation or by suprapubic pressure. After I went into the bladder I could scoped the patient but the scope could barely reach the bladder. I had to push the scope all the way in and accordion the penis to be safely in the bladder beyond the middle lobe. Once again the bladder would not distend very well so did not feel I had much bladder capacity to work with nor did I think it was safe to attempt to do so. When I pulled the scope back a few centimeters I would see the middle lobe and as I pulled back further the tissue would close with little to no safe visibility.  For this reason I did not try to reinsert the resectoscope. I felt that you've the scope would reach I would not be able to do this procedure safely  I decided to reinsert an 31 French catheter and perform a cystogram. Not surprisingly he had the intravesical component of the middle lobe as an indentation in a trabeculated bladder and a little bit of a Christmas tree shaped bladder and mild reflux on his right side. His capacity was 450 mL which was promising and I took x-rays during and at the end of filling and after inking the bladder. He had minimal hematuria draining from the catheter.  I did a digital rectal examination and once  again the prostate appeared to be approximately 50-60 g and felt benign  Unfortunately for technical reasons relative to his anatomy I could not safely insert a resectoscope and [place] the procedure. A lot of her another opinion since perhaps an open procedure or a minimal invasive procedure will be beneficial. Apparently Marlana Latus is working better.  The patient will be sent home  with a Foley catheter in call tomorrow and followed as per protocol

## 2011-12-30 NOTE — Progress Notes (Signed)
Dr. Okey Dupre aware of HR 45-48. BP stable, color pink, awake and alert. Answers questions appropriately. Resp easy and unlabored. No new orders at this time.

## 2011-12-30 NOTE — Interval H&P Note (Signed)
History and Physical Interval Note:  12/30/2011 6:17 AM  Glenn Ortega  has presented today for surgery, with the diagnosis of retention  The various methods of treatment have been discussed with the patient and family. After consideration of risks, benefits and other options for treatment, the patient has consented to  Procedure(s) (LRB): CYSTOSCOPY (N/A) TRANSURETHRAL RESECTION OF THE PROSTATE (TURP) (N/A) as a surgical intervention .  The patients' history has been reviewed, patient examined, no change in status, stable for surgery.  I have reviewed the patients' chart and labs.  Questions were answered to the patient's satisfaction.     [name] A

## 2011-12-30 NOTE — Progress Notes (Signed)
Dr. Luberta Robertson to come and see patient prior to D/C.

## 2011-12-30 NOTE — Progress Notes (Signed)
Instructed patient and spouse how to empty and change foley. Reviewed when to call doctor and d/c instructions.

## 2011-12-30 NOTE — H&P (Signed)
History of Present Illness   [name] has an urgent bladder and in the past has had urge incontinence. Flomax may have made him worse. A residual was 97 mL initially. On Myrbetriq he had a residual of 157 mL. His urge incontinence went away but he still had a lot of frequency every 2 hours and his flow continued to be slower in the morning. He had grade 3 out of 4 bladder trabeculation on cystoscopy. I want to check another residual off all medicine. He will need a PSA. He had some friable blood vessel in the prostatic urethra on cystoscopy.   Today he was here to discuss his urodynamics. Review of systems: No change in bowel or neurologic status.   Initially he voided 22 mL with a maximum flow of 3 mL per second with a long prolonged pattern and his residual was 350 mL. His maximum capacity was 450 mL. His bladder was unstable with very high pressure instability reaching pressures of 190 cm of water. He had moderate leakage associated with it. There may have been a little bit of artifact from the rectal line. He did not leak with a Valsalva pressure of 113 to 130 cm of water. During voluntary voiding he voided 47 mL with a maximum flow of 4 mL per second. Maximum voiding pressure was 122 to 143 cm of water and very prolonged. His residual was approximately 419 mL. The urodynamic catheter was removed to complete his void. He was doing a little bit of straining and this may have caused his EMG activity to go up intermittently during the voiding phase as noted, but otherwise, the EMG activity looked good. He had elevation of the bladder base fluoroscopically with bladder trabeculation. He was unaware of his low pressure instability during the filling phase. The details of the urodynamics are signed and dictated on the urodynamic sheet.   He has not had an upper tract X-ray.    Past Medical History Problems  1. History of  Esophageal Reflux 530.81 2. History of  Hypercholesterolemia 272.0 3. History of   Hypertension 401.9  Surgical History Problems  1. History of  Hand Surgery  Current Meds 1. Losartan Potassium 25 MG Oral Tablet; Therapy: (Recorded:08Jan2013) to 2. Omeprazole 20 MG Oral Capsule Delayed Release; Therapy: 15Oct2012 to  Allergies Medication  1. Penicillins  Family History Problems  1. Paternal history of  Death In The Family Father father passed @ age 89old age 31. Maternal history of  Death In The Family Mother mother passed @ age 75old age 318. Family history of  Family Health Status Number Of Children 1 son1 daughter  Social History Problems  1. Caffeine Use 2 drinks daily 2. Marital History - Currently Married 3. Never A Smoker 4. Occupation: Insurance risk surveyor  5. History of  Alcohol Use 6. History of  Tobacco Use  Assessment Assessed  1. Incomplete Emptying Of Bladder 788.21 2. Urge Incontinence Of Urine 788.31 3. Hydronephrosis 591 4. Benign Prostatic Hypertrophy 600.00  Plan Benign Prostatic Hypertrophy (600.00)  1. PSA  Done: 22Feb2013 04:29PM 2. VENIPUNCTURE  Done: 22Feb2013 3. Follow-up Schedule Surgery Office  Follow-up  Done: 22Feb2013 Hydronephrosis (591)  4. RENAL U/S COMPLETE  Requested for: 08Mar2013 5. Follow-up Weeks 1-2 Office  Follow-up  Requested for: 08Mar2013  Discussion/Summary   In my opinion Glenn Ortega has been chronically obstructed. He has high pressure instability and high pressure voiding. He has a tendency to have incomplete bladder emptying. In my opinion he should  have a PSA and a renal ultrasound. I think a TURP is a good treatment option for him to consider. We talked about the success and failure rates. We talked about the approximately 85% success rate with improving flow and that his overactive bladder symptoms can settle down in approximately two-thirds of cases. They persist in a one-third and worsen a few percent. He is at high risk of urge incontinence postoperatively because of his high pressure  instability and chronic bladder changes. His overactive bladder symptoms may take several months to resolve. Other risks including stricture, bleeding with sequelae, injury to other structures, pain, retrograde ejaculation and erectile dysfunction and others were discussed.   Glenn Ortega and his wife and I had a very good talk and he wants to proceed with surgery. He is still working and his symptoms are really bothering him. He really wants to proceed with something and I think it is a good choice for him. I think he has very good expectations.   I am going to get a PSA today and have him come back in 1-2 weeks with a renal ultrasound and schedule a TURP in a few weeks.   I told him to stop the Myrbetriq again because he says at night he almost has retention. I am going to send a copy of my note to Dr. Duane Lope to keep him updated on his treatment course.  After a thorough review of the management options for the patient's condition the patient  elected to proceed with surgical therapy as noted above. We have discussed the potential benefits and risks of the procedure, side effects of the proposed treatment, the likelihood of the patient achieving the goals of the procedure, and any potential problems that might occur during the procedure or recuperation. Informed consent has been obtained.

## 2011-12-30 NOTE — Transfer of Care (Signed)
Immediate Anesthesia Transfer of Care Note  Patient: Glenn Ortega  Procedure(s) Performed: Procedure(s) (LRB): CYSTOSCOPY (N/A)  Patient Location: PACU  Anesthesia Type: General  Level of Consciousness: awake, alert  and oriented  Airway & Oxygen Therapy: Patient Spontanous Breathing and Patient connected to face mask oxygen  Post-op Assessment: Report given to PACU RN and Post -op Vital signs reviewed and stable  Post vital signs: Reviewed and stable  Complications: No apparent anesthesia complications

## 2011-12-30 NOTE — Anesthesia Preprocedure Evaluation (Signed)
Anesthesia Evaluation  Patient identified by MRN, date of birth, ID band Patient awake    Reviewed: Allergy & Precautions, H&P , NPO status , Patient's Chart, lab work & pertinent test results  Airway Mallampati: II TM Distance: >3 FB Neck ROM: Full    Dental No notable dental hx.    Pulmonary neg pulmonary ROS, sleep apnea ,  breath sounds clear to auscultation  Pulmonary exam normal       Cardiovascular hypertension, negative cardio ROS  Rhythm:Regular Rate:Normal     Neuro/Psych negative neurological ROS  negative psych ROS   GI/Hepatic negative GI ROS, Neg liver ROS, GERD-  ,  Endo/Other  negative endocrine ROS  Renal/GU negative Renal ROS  negative genitourinary   Musculoskeletal negative musculoskeletal ROS (+)   Abdominal   Peds negative pediatric ROS (+)  Hematology negative hematology ROS (+)   Anesthesia Other Findings   Reproductive/Obstetrics negative OB ROS                           Anesthesia Physical Anesthesia Plan  ASA: III  Anesthesia Plan: General   Post-op Pain Management:    Induction: Intravenous  Airway Management Planned: LMA  Additional Equipment:   Intra-op Plan:   Post-operative Plan:   Informed Consent: I have reviewed the patients History and Physical, chart, labs and discussed the procedure including the risks, benefits and alternatives for the proposed anesthesia with the patient or authorized representative who has indicated his/her understanding and acceptance.   Dental advisory given  Plan Discussed with: CRNA  Anesthesia Plan Comments:         Anesthesia Quick Evaluation

## 2011-12-30 NOTE — Discharge Instructions (Signed)
I have reviewed discharge instructions in detail with the patient. They will follow-up with me or their physician as scheduled. My nurse will also be calling the patients as per protocol.  Start ASA next Wednesday; send home with leg bag, night bag and instructions

## 2011-12-30 NOTE — Anesthesia Postprocedure Evaluation (Signed)
  Anesthesia Post-op Note  Patient: Glenn Ortega  Procedure(s) Performed: Procedure(s) (LRB): CYSTOSCOPY (N/A)  Patient Location: PACU  Anesthesia Type: General  Level of Consciousness: awake and alert   Airway and Oxygen Therapy: Patient Spontanous Breathing  Post-op Pain: mild  Post-op Assessment: Post-op Vital signs reviewed, Patient's Cardiovascular Status Stable, Respiratory Function Stable, Patent Airway and No signs of Nausea or vomiting  Post-op Vital Signs: stable  Complications: No apparent anesthesia complications

## 2012-01-12 ENCOUNTER — Encounter (HOSPITAL_COMMUNITY): Payer: Self-pay | Admitting: Urology

## 2012-07-24 ENCOUNTER — Other Ambulatory Visit: Payer: Self-pay | Admitting: Neurosurgery

## 2012-07-24 DIAGNOSIS — M792 Neuralgia and neuritis, unspecified: Secondary | ICD-10-CM

## 2012-07-27 ENCOUNTER — Ambulatory Visit
Admission: RE | Admit: 2012-07-27 | Discharge: 2012-07-27 | Disposition: A | Discharge status: Home / Self Care | Payer: Medicare Other | Source: Ambulatory Visit | Attending: Neurosurgery | Admitting: Neurosurgery

## 2012-07-27 DIAGNOSIS — M792 Neuralgia and neuritis, unspecified: Secondary | ICD-10-CM

## 2013-09-05 ENCOUNTER — Emergency Department (HOSPITAL_BASED_OUTPATIENT_CLINIC_OR_DEPARTMENT_OTHER)
Admission: EM | Admit: 2013-09-05 | Discharge: 2013-09-05 | Disposition: A | Discharge status: Home / Self Care | Payer: Medicare Other | Attending: Emergency Medicine | Admitting: Emergency Medicine

## 2013-09-05 ENCOUNTER — Encounter (HOSPITAL_BASED_OUTPATIENT_CLINIC_OR_DEPARTMENT_OTHER): Payer: Self-pay | Admitting: Emergency Medicine

## 2013-09-05 ENCOUNTER — Emergency Department (HOSPITAL_BASED_OUTPATIENT_CLINIC_OR_DEPARTMENT_OTHER): Payer: Medicare Other

## 2013-09-05 DIAGNOSIS — Y9389 Activity, other specified: Secondary | ICD-10-CM | POA: Insufficient documentation

## 2013-09-05 DIAGNOSIS — Y990 Civilian activity done for income or pay: Secondary | ICD-10-CM | POA: Insufficient documentation

## 2013-09-05 DIAGNOSIS — K219 Gastro-esophageal reflux disease without esophagitis: Secondary | ICD-10-CM | POA: Insufficient documentation

## 2013-09-05 DIAGNOSIS — Z87891 Personal history of nicotine dependence: Secondary | ICD-10-CM | POA: Insufficient documentation

## 2013-09-05 DIAGNOSIS — Y929 Unspecified place or not applicable: Secondary | ICD-10-CM | POA: Insufficient documentation

## 2013-09-05 DIAGNOSIS — X500XXA Overexertion from strenuous movement or load, initial encounter: Secondary | ICD-10-CM | POA: Insufficient documentation

## 2013-09-05 DIAGNOSIS — Z79899 Other long term (current) drug therapy: Secondary | ICD-10-CM | POA: Insufficient documentation

## 2013-09-05 DIAGNOSIS — S32009A Unspecified fracture of unspecified lumbar vertebra, initial encounter for closed fracture: Secondary | ICD-10-CM | POA: Insufficient documentation

## 2013-09-05 DIAGNOSIS — Z880 Allergy status to penicillin: Secondary | ICD-10-CM | POA: Insufficient documentation

## 2013-09-05 DIAGNOSIS — S32000A Wedge compression fracture of unspecified lumbar vertebra, initial encounter for closed fracture: Secondary | ICD-10-CM

## 2013-09-05 DIAGNOSIS — Z8739 Personal history of other diseases of the musculoskeletal system and connective tissue: Secondary | ICD-10-CM | POA: Insufficient documentation

## 2013-09-05 DIAGNOSIS — I10 Essential (primary) hypertension: Secondary | ICD-10-CM | POA: Insufficient documentation

## 2013-09-05 MED ORDER — KETOROLAC TROMETHAMINE 60 MG/2ML IM SOLN
30.0000 mg | Freq: Once | INTRAMUSCULAR | Status: AC
  Administered 2013-09-05: 30 mg via INTRAMUSCULAR
  Filled 2013-09-05: qty 2

## 2013-09-05 MED ORDER — OXYCODONE-ACETAMINOPHEN 5-325 MG PO TABS: 1.0000 | ORAL_TABLET | Freq: Four times a day (QID) | ORAL | Status: DC | PRN

## 2013-09-05 NOTE — ED Notes (Signed)
Pt c/o back pain x 4 days ago while lofting heavy object

## 2013-09-05 NOTE — ED Provider Notes (Addendum)
CSN: [id number]     Arrival date & time 09/05/13  1751 History  This chart was scribed for Glenn Sprout, MD by Dorothey Baseman, ED Scribe. This patient was seen in room MH06/MH06 and the patient's care was started at 5:59 PM.    Chief Complaint  Patient presents with  . Back Pain   The history is provided by the patient. No language interpreter was used.   HPI Comments: Glenn Ortega is a 79 y.o. male who presents to the Emergency Department complaining of a constant pain to the lower back onset 4 days ago when he states that he was lifting a heavy object at work and heard a "snap." He states that the pain has been progressively worsening and is exacerbated with walking and bearing weight.  Patient reports applying hot and cold to the area and taking ibuprofen and Robaxin (twice a day) at home with mild, temporary relief. He denies any pain radiation down the legs, leg weakness, bowel or bladder problems. Patient reports a history of spinal stenosis that he receives injections for, but denies any surgeries to the area. Patient reports an allergy to prednisone. He denies history of DM or kidney problems.  Past Medical History  Diagnosis Date  . GERD (gastroesophageal reflux disease)   . Spinal stenosis     lumbar  . Hypertension     EKG and chest x ray 8/12 [facility]/states had stress test 2 yrs ago- doesnt remember where- not in [facility]  . Sleep apnea     STOP BANG 4   Past Surgical History  Procedure Laterality Date  . Tonsillectomy    . Colonoscopy    . Trigger thumb  1980    right  . Cystoscopy  12/30/2011    Procedure: CYSTOSCOPY;  Surgeon: Martina Sinner, MD;  Location: [facility];  Service: Urology;  Laterality: N/A;   History reviewed. No pertinent family history. History  Substance Use Topics  . Smoking status: Former Smoker -- 1.00 packs/day for .5 years    Quit date: 12/26/1953  . Smokeless tobacco: Never Used  . Alcohol Use: Yes     Comment: socially- occ beer    Review of  Systems  A complete 10 system review of systems was obtained and all systems are negative except as noted in the HPI and PMH.   Allergies  Penicillins  Home Medications   Current Outpatient Rx  Name  Route  Sig  Dispense  Refill  . losartan (COZAAR) 25 MG tablet   Oral   Take 25 mg by mouth daily.          . Mirabegron ER (MYRBETRIQ) 25 MG TB24   Oral   Take 25 mg by mouth daily.         Marland Kitchen omeprazole (PRILOSEC) 20 MG capsule   Oral   Take 20 mg by mouth daily with supper.           Triage Vitals: BP 174/77  Pulse 64  Temp(Src) 97.7 F (36.5 C) (Oral)  Resp 16  Ht 5\' 8"  (1.727 m)  Wt 228 lb (103.42 kg)  BMI 34.68 kg/m2  SpO2 98%  Physical Exam  Nursing note and vitals reviewed. Constitutional: He is oriented to person, place, and time. He appears well-developed and well-nourished. No distress.  HENT:  Head: Normocephalic and atraumatic.  Eyes: Conjunctivae are normal.  Neck: Normal range of motion. Neck supple.  Cardiovascular: Normal rate, normal heart sounds and intact distal pulses.   No  murmur heard. Pulmonary/Chest: Effort normal and breath sounds normal. No respiratory distress. He has no wheezes. He has no rales.  Abdominal: Soft. He exhibits no distension. There is no tenderness.  Musculoskeletal: Normal range of motion.  Lower lumbar point tenderness and paraspinal lumbar tenderness bilaterally. No edema in his feet.   Neurological: He is alert and oriented to person, place, and time. He has normal strength. No sensory deficit.  Normal strength and sensation throughout.   Skin: Skin is warm and dry.  Psychiatric: He has a normal mood and affect. His behavior is normal.    ED Course  Procedures (including critical care time)  DIAGNOSTIC STUDIES: Oxygen Saturation is 98% on room air, normal by my interpretation.    COORDINATION OF CARE: 6:03 PM- Will order an x-ray of the L spine. Will order an injection Toradol to manage symptoms. Discussed  treatment plan with patient at bedside and patient verbalized agreement.     Labs Review Labs Reviewed - No data to display  Imaging Review Dg Lumbar Spine Complete  09/05/2013   CLINICAL DATA:  Lifting injury 4 days ago, now with severe left-sided back pain, evaluate for compression fracture  EXAM: LUMBAR SPINE - COMPLETE 4+ VIEW  COMPARISON:  10/08/2010; lumbar spine MRI -07/27/2012  FINDINGS: There are 5 non rib-bearing lumbar type vertebral bodies.  There is a very mild scoliotic curvature of the thoracolumbar spine with inferior curvature convex to the left, possibly positional. No definite anterolisthesis or retrolisthesis. No definite pars defects.  Age-indeterminate mild (approximately 25%) compression deformity involving the superior endplate of the L5 vertebral body. Grossly unchanged mild (under 25%) compression deformities involving superior endplates of the T12 and L1 vertebral bodies. Remaining vertebral body heights appear preserved.  There is grossly unchanged multilevel mild to moderate DDD, likely worse at T12-L1 and L4-L5 with disc space height loss, endplate irregularity and sclerosis. Bilateral facet degenerative change within the lower lumbar spine is suspected.  Limited visualization of the bilateral SI joints is normal. Regional bowel gas pattern is normal. Regional soft tissues are normal.  IMPRESSION: 1. Mild (approximately 25%) compression deformity involving the superior endplate of the L5 vertebral body, age-indeterminate though new since the 07/2012 lumbar spine MRI. Correlation for point tenderness at this location is recommended. 2. Grossly unchanged mild (approximately 25%) compression deformities involving the superior endplates of the T12 and L1 vertebral bodies. 3. Grossly unchanged multilevel mild to moderate DDD.   Electronically Signed   By: Simonne Come M.D.   On: 09/05/2013 18:41    EKG Interpretation   None       MDM   1. Lumbar compression fracture,  closed, initial encounter     Patient with lower lumbar pain for the last 4 days after lifting a heavy date. He is attempted to use ibuprofen, heat, ice, muscle drugs without improvement. He denies any radicular symptoms and states he has a history of a slipped disc in this feels nothing like that. He is also attempted Robaxin for 2 days without improvement.  Patient is neurovascularly intact on exam with normal sensation and strength in the legs. He denies any incontinence or urinary retention. Will get plain lumbar films to ensure the patient did not sustain a compression fracture. Will treat pain with Toradol as patient is driving.  6:64 PM Films show new compression fx at L5 which is location of pt's pain.  Will give pain control and have f/u with PCP for possible kyphoplasty prn for pain control.  I personally performed the services described in this documentation, which was scribed in my presence.  The recorded information has been reviewed and considered.     Glenn Sprout, MD 09/05/13 1610  Glenn Sprout, MD 09/05/13 [id number]

## 2013-09-24 ENCOUNTER — Other Ambulatory Visit: Payer: Self-pay | Admitting: Neurosurgery

## 2013-09-24 DIAGNOSIS — M5126 Other intervertebral disc displacement, lumbar region: Secondary | ICD-10-CM

## 2013-09-24 DIAGNOSIS — G959 Disease of spinal cord, unspecified: Secondary | ICD-10-CM

## 2013-10-02 ENCOUNTER — Ambulatory Visit
Admission: RE | Admit: 2013-10-02 | Discharge: 2013-10-02 | Disposition: A | Discharge status: Home / Self Care | Payer: Medicare Other | Source: Ambulatory Visit | Attending: Neurosurgery | Admitting: Neurosurgery

## 2013-10-02 VITALS — BP 162/71 | HR 50

## 2013-10-02 DIAGNOSIS — G959 Disease of spinal cord, unspecified: Secondary | ICD-10-CM

## 2013-10-02 DIAGNOSIS — M5126 Other intervertebral disc displacement, lumbar region: Secondary | ICD-10-CM

## 2013-10-02 MED ORDER — IOHEXOL 300 MG/ML  SOLN
10.0000 mL | Freq: Once | INTRAMUSCULAR | Status: AC | PRN
  Administered 2013-10-02: 10 mL via INTRATHECAL

## 2013-10-02 MED ORDER — ONDANSETRON HCL 4 MG/2ML IJ SOLN
4.0000 mg | Freq: Once | INTRAMUSCULAR | Status: AC
  Administered 2013-10-02: 4 mg via INTRAMUSCULAR

## 2013-10-02 MED ORDER — DIAZEPAM 5 MG PO TABS
5.0000 mg | ORAL_TABLET | Freq: Once | ORAL | Status: AC
  Administered 2013-10-02: 5 mg via ORAL

## 2013-10-02 MED ORDER — MEPERIDINE HCL 100 MG/ML IJ SOLN
75.0000 mg | Freq: Once | INTRAMUSCULAR | Status: AC
  Administered 2013-10-02: 75 mg via INTRAMUSCULAR

## 2013-10-02 MED ORDER — DIAZEPAM 5 MG PO TABS: 5.0000 mg | ORAL_TABLET | Freq: Once | ORAL | Status: AC

## 2013-10-02 MED ORDER — HYDROCODONE-ACETAMINOPHEN 5-325 MG PO TABS
1.0000 | ORAL_TABLET | Freq: Once | ORAL | Status: AC
  Administered 2013-10-02: 1 via ORAL

## 2013-10-19 ENCOUNTER — Other Ambulatory Visit: Payer: Self-pay | Admitting: Neurosurgery

## 2013-10-25 ENCOUNTER — Encounter (HOSPITAL_COMMUNITY): Payer: Self-pay | Admitting: Pharmacy Technician

## 2013-11-02 ENCOUNTER — Encounter (HOSPITAL_COMMUNITY): Payer: Self-pay

## 2013-11-02 ENCOUNTER — Encounter (HOSPITAL_COMMUNITY)
Admission: RE | Admit: 2013-11-02 | Discharge: 2013-11-02 | Disposition: A | Discharge status: Home / Self Care | Payer: Medicare HMO | Source: Ambulatory Visit | Attending: Neurosurgery | Admitting: Neurosurgery

## 2013-11-02 ENCOUNTER — Encounter (HOSPITAL_COMMUNITY)
Admission: RE | Admit: 2013-11-02 | Discharge: 2013-11-02 | Disposition: A | Discharge status: Home / Self Care | Payer: Medicare HMO | Source: Ambulatory Visit | Attending: Anesthesiology | Admitting: Anesthesiology

## 2013-11-02 DIAGNOSIS — Z01810 Encounter for preprocedural cardiovascular examination: Secondary | ICD-10-CM | POA: Insufficient documentation

## 2013-11-02 DIAGNOSIS — Z01812 Encounter for preprocedural laboratory examination: Secondary | ICD-10-CM | POA: Insufficient documentation

## 2013-11-02 DIAGNOSIS — Z01818 Encounter for other preprocedural examination: Secondary | ICD-10-CM | POA: Insufficient documentation

## 2013-11-02 LAB — BASIC METABOLIC PANEL
BUN: 24 mg/dL — AB (ref 6–23)
CO2: 24 mEq/L (ref 19–32)
CREATININE: 1.13 mg/dL (ref 0.50–1.35)
Calcium: 9.1 mg/dL (ref 8.4–10.5)
Chloride: 105 mEq/L (ref 96–112)
GFR calc non Af Amer: 61 mL/min — ABNORMAL LOW (ref >90)
GFR, EST AFRICAN AMERICAN: 70 mL/min — AB (ref >90)
GLUCOSE: 103 mg/dL — AB (ref 70–99)
POTASSIUM: 4.4 meq/L (ref 3.7–5.3)
Sodium: 142 mEq/L (ref 137–147)

## 2013-11-02 LAB — CBC
HCT: 39 % (ref 39.0–52.0)
HEMOGLOBIN: 13.8 g/dL (ref 13.0–17.0)
MCH: 29.2 pg (ref 26.0–34.0)
MCHC: 35.4 g/dL (ref 30.0–36.0)
MCV: 82.6 fL (ref 78.0–100.0)
Platelets: 188 10*3/uL (ref 150–400)
RBC: 4.72 MIL/uL (ref 4.22–5.81)
RDW: 13.9 % (ref 11.5–15.5)
WBC: 6.4 10*3/uL (ref 4.0–10.5)

## 2013-11-02 LAB — SURGICAL PCR SCREEN
MRSA, PCR: NEGATIVE
Staphylococcus aureus: NEGATIVE

## 2013-11-02 NOTE — Pre-Procedure Instructions (Signed)
Glenn Ortega  11/02/2013   Your procedure is scheduled on:  Friday, November 09, 2012 @ 7:30 AM  Report to [facility] Short Stay (use Main Entrance "A'') at 5:30 AM.  Call this number if you have problems the morning of surgery: [phone]   Remember:   Do not eat food or drink liquids after midnight.   Take these medicines the morning of surgery with A SIP OF WATER: if needed:  oxyCODONE-acetaminophen (PERCOCET/ROXICET) 5-325 MG per tablet for severe pain Stop taking Aspirin, vitamins and herbal medications. Do not take any NSAIDs ie: Ibuprofen, Advil, Naproxen or any medication containing Aspirin.  Do not wear jewelry, make-up or nail polish.  Do not wear lotions, powders, or perfumes. You may wear deodorant.  Do not shave 48 hours prior to surgery. Men may shave face and neck.  Do not bring valuables to the hospital.  [facility] is not responsible for any belongings or valuables.               Contacts, dentures or bridgework may not be worn into surgery.  Leave suitcase in the car. After surgery it may be brought to your room.  For patients admitted to the hospital, discharge time is determined by your  treatment team.               Patients discharged the day of surgery will not be allowed to drive home.  Name and phone number of your driver:   Special Instructions: Shower using CHG 2 nights before surgery and the night before surgery.  If you shower the day of surgery use CHG.  Use special wash - you have one bottle of CHG for all showers.  You should use approximately 1/3 of the bottle for each shower.   Please read over the following fact sheets that you were given: Pain Booklet, Coughing and Deep Breathing, MRSA Information and Surgical Site Infection Prevention

## 2013-11-08 MED ORDER — VANCOMYCIN HCL IN DEXTROSE 1-5 GM/200ML-% IV SOLN
1000.0000 mg | INTRAVENOUS | Status: AC
  Administered 2013-11-09: 1000 mg via INTRAVENOUS
  Filled 2013-11-08: qty 200

## 2013-11-08 MED ORDER — DEXAMETHASONE SODIUM PHOSPHATE 10 MG/ML IJ SOLN
10.0000 mg | INTRAMUSCULAR | Status: AC
  Administered 2013-11-09: 10 mg via INTRAVENOUS
  Filled 2013-11-08: qty 1

## 2013-11-09 ENCOUNTER — Encounter (HOSPITAL_COMMUNITY): Payer: Self-pay | Admitting: *Deleted

## 2013-11-09 ENCOUNTER — Encounter (HOSPITAL_COMMUNITY): Payer: Medicare HMO | Admitting: Certified Registered Nurse Anesthetist

## 2013-11-09 ENCOUNTER — Ambulatory Visit (HOSPITAL_COMMUNITY): Payer: Medicare HMO | Admitting: Certified Registered Nurse Anesthetist

## 2013-11-09 ENCOUNTER — Encounter (HOSPITAL_COMMUNITY)
Admission: RE | Disposition: A | Discharge status: Home / Self Care | Payer: Self-pay | Source: Ambulatory Visit | Attending: Neurosurgery

## 2013-11-09 ENCOUNTER — Ambulatory Visit (HOSPITAL_COMMUNITY)
Admission: RE | Admit: 2013-11-09 | Discharge: 2013-11-10 | Disposition: A | Discharge status: Home / Self Care | Payer: Medicare HMO | Source: Ambulatory Visit | Attending: Neurosurgery | Admitting: Neurosurgery

## 2013-11-09 ENCOUNTER — Ambulatory Visit (HOSPITAL_COMMUNITY): Payer: Medicare HMO

## 2013-11-09 DIAGNOSIS — M5126 Other intervertebral disc displacement, lumbar region: Secondary | ICD-10-CM | POA: Insufficient documentation

## 2013-11-09 DIAGNOSIS — I10 Essential (primary) hypertension: Secondary | ICD-10-CM | POA: Insufficient documentation

## 2013-11-09 DIAGNOSIS — K219 Gastro-esophageal reflux disease without esophagitis: Secondary | ICD-10-CM | POA: Insufficient documentation

## 2013-11-09 DIAGNOSIS — M48061 Spinal stenosis, lumbar region without neurogenic claudication: Secondary | ICD-10-CM | POA: Diagnosis present

## 2013-11-09 DIAGNOSIS — Z87891 Personal history of nicotine dependence: Secondary | ICD-10-CM | POA: Insufficient documentation

## 2013-11-09 HISTORY — PX: LUMBAR LAMINECTOMY/DECOMPRESSION MICRODISCECTOMY: SHX5026

## 2013-11-09 SURGERY — LUMBAR LAMINECTOMY/DECOMPRESSION MICRODISCECTOMY 2 LEVELS
Anesthesia: General | Site: Back

## 2013-11-09 MED ORDER — MIDAZOLAM HCL 2 MG/2ML IJ SOLN
INTRAMUSCULAR | Status: AC
  Filled 2013-11-09: qty 2

## 2013-11-09 MED ORDER — LIDOCAINE HCL (CARDIAC) 20 MG/ML IV SOLN
INTRAVENOUS | Status: DC | PRN
  Administered 2013-11-09: 100 mg via INTRAVENOUS

## 2013-11-09 MED ORDER — GLYCOPYRROLATE 0.2 MG/ML IJ SOLN
INTRAMUSCULAR | Status: AC
  Filled 2013-11-09: qty 4

## 2013-11-09 MED ORDER — ONDANSETRON HCL 4 MG/2ML IJ SOLN
INTRAMUSCULAR | Status: AC
  Filled 2013-11-09: qty 2

## 2013-11-09 MED ORDER — THROMBIN 20000 UNITS EX SOLR
CUTANEOUS | Status: DC | PRN
  Administered 2013-11-09: 09:00:00 via TOPICAL

## 2013-11-09 MED ORDER — ARTIFICIAL TEARS OP OINT
TOPICAL_OINTMENT | OPHTHALMIC | Status: AC
  Filled 2013-11-09: qty 3.5

## 2013-11-09 MED ORDER — SODIUM CHLORIDE 0.9 % IV SOLN: 250.0000 mL | INTRAVENOUS | Status: DC

## 2013-11-09 MED ORDER — DEXAMETHASONE SODIUM PHOSPHATE 4 MG/ML IJ SOLN: 4.0000 mg | Freq: Four times a day (QID) | INTRAMUSCULAR | Status: AC

## 2013-11-09 MED ORDER — HYDROMORPHONE HCL PF 1 MG/ML IJ SOLN: 1.0000 mg | INTRAMUSCULAR | Status: DC | PRN

## 2013-11-09 MED ORDER — GLYCOPYRROLATE 0.2 MG/ML IJ SOLN
INTRAMUSCULAR | Status: AC
  Filled 2013-11-09: qty 1

## 2013-11-09 MED ORDER — KETOROLAC TROMETHAMINE 30 MG/ML IJ SOLN
30.0000 mg | Freq: Four times a day (QID) | INTRAMUSCULAR | Status: DC
  Administered 2013-11-09 – 2013-11-10 (×3): 30 mg via INTRAVENOUS
  Filled 2013-11-09 (×5): qty 1

## 2013-11-09 MED ORDER — OXYBUTYNIN CHLORIDE ER 10 MG PO TB24
10.0000 mg | ORAL_TABLET | Freq: Every day | ORAL | Status: DC
Start: 2013-11-09 — End: 2013-11-10
  Administered 2013-11-09: 10 mg via ORAL
  Filled 2013-11-09 (×2): qty 1

## 2013-11-09 MED ORDER — PROPOFOL 10 MG/ML IV BOLUS
INTRAVENOUS | Status: DC | PRN
  Administered 2013-11-09: 130 mg via INTRAVENOUS

## 2013-11-09 MED ORDER — HYDROMORPHONE HCL PF 1 MG/ML IJ SOLN
0.2500 mg | INTRAMUSCULAR | Status: DC | PRN
  Administered 2013-11-09: 0.5 mg via INTRAVENOUS

## 2013-11-09 MED ORDER — HEMOSTATIC AGENTS (NO CHARGE) OPTIME
TOPICAL | Status: DC | PRN
  Administered 2013-11-09: 1 via TOPICAL

## 2013-11-09 MED ORDER — SODIUM CHLORIDE 0.9 % IJ SOLN: 3.0000 mL | INTRAMUSCULAR | Status: DC | PRN

## 2013-11-09 MED ORDER — LOSARTAN POTASSIUM 25 MG PO TABS
25.0000 mg | ORAL_TABLET | Freq: Every day | ORAL | Status: DC
  Administered 2013-11-09: 25 mg via ORAL
  Filled 2013-11-09 (×2): qty 1

## 2013-11-09 MED ORDER — PROPOFOL 10 MG/ML IV BOLUS
INTRAVENOUS | Status: AC
  Filled 2013-11-09: qty 20

## 2013-11-09 MED ORDER — PHENYLEPHRINE 40 MCG/ML (10ML) SYRINGE FOR IV PUSH (FOR BLOOD PRESSURE SUPPORT)
PREFILLED_SYRINGE | INTRAVENOUS | Status: AC
  Filled 2013-11-09: qty 10

## 2013-11-09 MED ORDER — NEOSTIGMINE METHYLSULFATE 1 MG/ML IJ SOLN
INTRAMUSCULAR | Status: DC | PRN
  Administered 2013-11-09: 5 mg via INTRAVENOUS

## 2013-11-09 MED ORDER — NEOSTIGMINE METHYLSULFATE 1 MG/ML IJ SOLN
INTRAMUSCULAR | Status: AC
  Filled 2013-11-09: qty 10

## 2013-11-09 MED ORDER — EPHEDRINE SULFATE 50 MG/ML IJ SOLN
INTRAMUSCULAR | Status: AC
  Filled 2013-11-09: qty 1

## 2013-11-09 MED ORDER — SODIUM CHLORIDE 0.9 % IR SOLN
Status: DC | PRN
  Administered 2013-11-09: 08:00:00

## 2013-11-09 MED ORDER — GLYCOPYRROLATE 0.2 MG/ML IJ SOLN
INTRAMUSCULAR | Status: DC | PRN
  Administered 2013-11-09: .8 mg via INTRAVENOUS
  Administered 2013-11-09: 0.2 mg via INTRAVENOUS

## 2013-11-09 MED ORDER — SUCCINYLCHOLINE CHLORIDE 20 MG/ML IJ SOLN
INTRAMUSCULAR | Status: AC
  Filled 2013-11-09: qty 1

## 2013-11-09 MED ORDER — ONDANSETRON HCL 4 MG/2ML IJ SOLN: 4.0000 mg | Freq: Once | INTRAMUSCULAR | Status: DC | PRN

## 2013-11-09 MED ORDER — SODIUM CHLORIDE 0.9 % IJ SOLN
3.0000 mL | Freq: Two times a day (BID) | INTRAMUSCULAR | Status: DC
  Administered 2013-11-09: 3 mL via INTRAVENOUS

## 2013-11-09 MED ORDER — ACETAMINOPHEN 325 MG PO TABS: 650.0000 mg | ORAL_TABLET | ORAL | Status: DC | PRN

## 2013-11-09 MED ORDER — ONDANSETRON HCL 4 MG/2ML IJ SOLN: 4.0000 mg | INTRAMUSCULAR | Status: DC | PRN

## 2013-11-09 MED ORDER — LIDOCAINE HCL (CARDIAC) 20 MG/ML IV SOLN
INTRAVENOUS | Status: AC
  Filled 2013-11-09: qty 5

## 2013-11-09 MED ORDER — KCL IN DEXTROSE-NACL 20-5-0.45 MEQ/L-%-% IV SOLN
80.0000 mL/h | INTRAVENOUS | Status: DC
  Filled 2013-11-09 (×3): qty 1000

## 2013-11-09 MED ORDER — FENTANYL CITRATE 0.05 MG/ML IJ SOLN
INTRAMUSCULAR | Status: DC | PRN
  Administered 2013-11-09: 100 ug via INTRAVENOUS
  Administered 2013-11-09 (×2): 50 ug via INTRAVENOUS

## 2013-11-09 MED ORDER — EPHEDRINE SULFATE 50 MG/ML IJ SOLN
INTRAMUSCULAR | Status: DC | PRN
  Administered 2013-11-09 (×2): 5 mg via INTRAVENOUS
  Administered 2013-11-09: 10 mg via INTRAVENOUS
  Administered 2013-11-09 (×2): 5 mg via INTRAVENOUS

## 2013-11-09 MED ORDER — ACETAMINOPHEN 650 MG RE SUPP: 650.0000 mg | RECTAL | Status: DC | PRN

## 2013-11-09 MED ORDER — BUPIVACAINE HCL (PF) 0.5 % IJ SOLN
INTRAMUSCULAR | Status: DC | PRN
  Administered 2013-11-09: 20 mL

## 2013-11-09 MED ORDER — FENTANYL CITRATE 0.05 MG/ML IJ SOLN
INTRAMUSCULAR | Status: AC
  Filled 2013-11-09: qty 5

## 2013-11-09 MED ORDER — LACTATED RINGERS IV SOLN
INTRAVENOUS | Status: DC | PRN
  Administered 2013-11-09 (×2): via INTRAVENOUS

## 2013-11-09 MED ORDER — ONDANSETRON HCL 4 MG/2ML IJ SOLN
INTRAMUSCULAR | Status: DC | PRN
  Administered 2013-11-09: 4 mg via INTRAVENOUS

## 2013-11-09 MED ORDER — VANCOMYCIN HCL 10 G IV SOLR
1500.0000 mg | INTRAVENOUS | Status: DC
  Administered 2013-11-09: 1500 mg via INTRAVENOUS
  Filled 2013-11-09: qty 1500

## 2013-11-09 MED ORDER — STERILE WATER FOR INJECTION IJ SOLN
INTRAMUSCULAR | Status: AC
  Filled 2013-11-09: qty 10

## 2013-11-09 MED ORDER — DEXAMETHASONE 4 MG PO TABS
4.0000 mg | ORAL_TABLET | Freq: Four times a day (QID) | ORAL | Status: AC
  Administered 2013-11-09 (×2): 4 mg via ORAL
  Filled 2013-11-09 (×2): qty 1

## 2013-11-09 MED ORDER — PANTOPRAZOLE SODIUM 40 MG IV SOLR
40.0000 mg | Freq: Every day | INTRAVENOUS | Status: DC
  Administered 2013-11-09: 40 mg via INTRAVENOUS
  Filled 2013-11-09 (×2): qty 40

## 2013-11-09 MED ORDER — ROCURONIUM BROMIDE 100 MG/10ML IV SOLN
INTRAVENOUS | Status: DC | PRN
  Administered 2013-11-09: 40 mg via INTRAVENOUS
  Administered 2013-11-09: 10 mg via INTRAVENOUS

## 2013-11-09 MED ORDER — CYCLOBENZAPRINE HCL 10 MG PO TABS: 10.0000 mg | ORAL_TABLET | Freq: Three times a day (TID) | ORAL | Status: DC | PRN

## 2013-11-09 MED ORDER — ROCURONIUM BROMIDE 50 MG/5ML IV SOLN
INTRAVENOUS | Status: AC
  Filled 2013-11-09: qty 1

## 2013-11-09 MED ORDER — THROMBIN 5000 UNITS EX SOLR
CUTANEOUS | Status: DC | PRN
  Administered 2013-11-09 (×2): 5000 [IU] via TOPICAL

## 2013-11-09 MED ORDER — HYDROCODONE-ACETAMINOPHEN 5-325 MG PO TABS: 1.0000 | ORAL_TABLET | ORAL | Status: DC | PRN

## 2013-11-09 MED ORDER — PHENOL 1.4 % MT LIQD: 1.0000 | OROMUCOSAL | Status: DC | PRN

## 2013-11-09 MED ORDER — MENTHOL 3 MG MT LOZG: 1.0000 | LOZENGE | OROMUCOSAL | Status: DC | PRN

## 2013-11-09 MED ORDER — VANCOMYCIN HCL IN DEXTROSE 1-5 GM/200ML-% IV SOLN
INTRAVENOUS | Status: AC
  Filled 2013-11-09: qty 200

## 2013-11-09 MED ORDER — HYDROMORPHONE HCL PF 1 MG/ML IJ SOLN
INTRAMUSCULAR | Status: AC
  Filled 2013-11-09: qty 1

## 2013-11-09 MED ORDER — ARTIFICIAL TEARS OP OINT
TOPICAL_OINTMENT | OPHTHALMIC | Status: DC | PRN
  Administered 2013-11-09: 1 via OPHTHALMIC

## 2013-11-09 SURGICAL SUPPLY — 52 items
BAG DECANTER FOR FLEXI CONT (MISCELLANEOUS) ×2 IMPLANT
BENZOIN TINCTURE PRP APPL 2/3 (GAUZE/BANDAGES/DRESSINGS) ×2 IMPLANT
BLADE SURG ROTATE 9660 (MISCELLANEOUS) ×2 IMPLANT
BRUSH SCRUB EZ PLAIN DRY (MISCELLANEOUS) ×2 IMPLANT
BUR CUTTER 7.0 ROUND (BURR) ×2 IMPLANT
BUR MATCHSTICK NEURO 3.0 LAGG (BURR) ×2 IMPLANT
CANISTER SUCT 3000ML (MISCELLANEOUS) ×2 IMPLANT
CONT SPEC 4OZ CLIKSEAL STRL BL (MISCELLANEOUS) ×2 IMPLANT
DERMABOND ADVANCED (GAUZE/BANDAGES/DRESSINGS) ×1
DERMABOND ADVANCED .7 DNX12 (GAUZE/BANDAGES/DRESSINGS) ×1 IMPLANT
DEVICE COFLEX STABLIZATION 10M (Neuro Prosthesis/Implant) ×2 IMPLANT
DEVICE COFLEX STABLIZATION 8MM (Neuro Prosthesis/Implant) ×2 IMPLANT
DRAPE LAPAROTOMY 100X72X124 (DRAPES) ×2 IMPLANT
DRAPE MICROSCOPE LEICA (MISCELLANEOUS) ×2 IMPLANT
DRAPE MICROSCOPE ZEISS OPMI (DRAPES) IMPLANT
DRAPE SURG 17X23 STRL (DRAPES) ×4 IMPLANT
DRESSING TELFA 8X3 (GAUZE/BANDAGES/DRESSINGS) ×2 IMPLANT
DRSG OPSITE POSTOP 4X6 (GAUZE/BANDAGES/DRESSINGS) ×2 IMPLANT
ELECT REM PT RETURN 9FT ADLT (ELECTROSURGICAL) ×2
ELECTRODE REM PT RTRN 9FT ADLT (ELECTROSURGICAL) ×1 IMPLANT
EVACUATOR 1/8 PVC DRAIN (DRAIN) ×2 IMPLANT
GAUZE SPONGE 4X4 16PLY XRAY LF (GAUZE/BANDAGES/DRESSINGS) ×2 IMPLANT
GLOVE BIOGEL PI IND STRL 7.0 (GLOVE) ×1 IMPLANT
GLOVE BIOGEL PI INDICATOR 7.0 (GLOVE) ×1
GLOVE ECLIPSE 8.0 STRL XLNG CF (GLOVE) ×2 IMPLANT
GLOVE EXAM NITRILE PF MED BLUE (GLOVE) ×2 IMPLANT
GLOVE SS BIOGEL STRL SZ 6.5 (GLOVE) ×2 IMPLANT
GLOVE SUPERSENSE BIOGEL SZ 6.5 (GLOVE) ×2
GOWN BRE IMP SLV AUR LG STRL (GOWN DISPOSABLE) IMPLANT
GOWN BRE IMP SLV AUR XL STRL (GOWN DISPOSABLE) IMPLANT
GOWN STRL REIN 2XL LVL4 (GOWN DISPOSABLE) IMPLANT
GOWN STRL REUS W/ TWL LRG LVL3 (GOWN DISPOSABLE) ×3 IMPLANT
GOWN STRL REUS W/TWL LRG LVL3 (GOWN DISPOSABLE) ×3
KIT BASIN OR (CUSTOM PROCEDURE TRAY) ×2 IMPLANT
KIT ROOM TURNOVER OR (KITS) ×2 IMPLANT
NEEDLE HYPO 22GX1.5 SAFETY (NEEDLE) ×2 IMPLANT
NEEDLE SPNL 22GX3.5 QUINCKE BK (NEEDLE) ×4 IMPLANT
NS IRRIG 1000ML POUR BTL (IV SOLUTION) ×2 IMPLANT
PACK LAMINECTOMY NEURO (CUSTOM PROCEDURE TRAY) ×2 IMPLANT
PAD ARMBOARD 7.5X6 YLW CONV (MISCELLANEOUS) ×6 IMPLANT
PATTIES SURGICAL .75X.75 (GAUZE/BANDAGES/DRESSINGS) ×2 IMPLANT
RUBBERBAND STERILE (MISCELLANEOUS) ×4 IMPLANT
SPONGE GAUZE 4X4 12PLY (GAUZE/BANDAGES/DRESSINGS) ×2 IMPLANT
SPONGE SURGIFOAM ABS GEL 100 (HEMOSTASIS) ×2 IMPLANT
SPONGE SURGIFOAM ABS GEL SZ50 (HEMOSTASIS) ×2 IMPLANT
STRIP CLOSURE SKIN 1/2X4 (GAUZE/BANDAGES/DRESSINGS) ×2 IMPLANT
SUT VIC AB 2-0 OS6 18 (SUTURE) ×8 IMPLANT
SUT VIC AB 3-0 CP2 18 (SUTURE) ×2 IMPLANT
SYR 20ML ECCENTRIC (SYRINGE) ×2 IMPLANT
TOWEL OR 17X24 6PK STRL BLUE (TOWEL DISPOSABLE) ×2 IMPLANT
TOWEL OR 17X26 10 PK STRL BLUE (TOWEL DISPOSABLE) ×2 IMPLANT
WATER STERILE IRR 1000ML POUR (IV SOLUTION) ×2 IMPLANT

## 2013-11-09 NOTE — Op Note (Signed)
Preop diagnosis: Severe spinal stenosis L3-4 L4-5 with complete myelographic block and possible herniated disc Postop diagnosis: Same with herniated disc L3-4 Procedure: Bilateral L3-4 and L4-5 decompressive laminectomy   right L3-4 microdiscectomy  L3-4 L4-5 interlaminar instrumentation with complex Surgeon: Leroy Pettway Assistant: Luiz Ochoa  After being placed the prone position the patient's back was prepped and draped in the usual sterile fashion. Localizing x-ray was taken prior to incision to identify the appropriate level. Midline incision was made above the spinous processes of L3-L4 and L5. Using Bovie cutting current the incision was carried on the spinous processes. Subperiosteal dissection was then carried out bilaterally on the spinous processes lamina facet joint subcutaneous tract was placed for exposure. The showed approach the appropriate levels. Starting the patient's left side generous laminotomies were performed at L3-4 and L4-5. Thorough decompression was carried out with removal of hypertrophic ligamentum flavum. We then did similar decompression on the right. Which with spinal stenosis aggressively. We then explored the interspinous area and removed interspinous ligament and made Coflex device. We then evaluated the disc at both levels and some of the disc L3-4 with herniated. We we coagulated the annulus on the right side and thoroughly cleaned out with pituitary rongeurs and curettes. At this point we inspected once more for any evidence of residual compression at that level and none could be identified. Irrigation was carried out and any bleeding control proper coagulation Gelfoam. We then used the trials and placed a 10 mm complex device at L3-4 and an 8 mm Coflex device L4-5. Followed into good position in by fluoroscopy. We then irrigated once more at that the dural drain the epidural out through separate stab incision. Then closed the wound in multiple layers of Vicryl on the muscle  fascia subcutaneous and subcuticular tissues. Dermabond Steri-Strips were placed on the skin. Shortness was then applied and the patient was extubated and taken to recovery room in stable condition.

## 2013-11-09 NOTE — Preoperative (Signed)
Beta Blockers   Reason not to administer Beta Blockers:Not Applicable 

## 2013-11-09 NOTE — Anesthesia Preprocedure Evaluation (Addendum)
Anesthesia Evaluation  Patient identified by MRN, date of birth, ID band Patient awake    Reviewed: Allergy & Precautions, H&P , NPO status , Patient's Chart, lab work & pertinent test results  Airway Mallampati: II TM Distance: >3 FB Neck ROM: Full    Dental  (+) Dental Advisory Given, Edentulous Upper and Teeth Intact   Pulmonary former smoker,          Cardiovascular hypertension, Pt. on medications     Neuro/Psych    GI/Hepatic GERD-  Medicated,  Endo/Other  Morbid obesity  Renal/GU      Musculoskeletal   Abdominal   Peds  Hematology   Anesthesia Other Findings   Reproductive/Obstetrics                        Anesthesia Physical Anesthesia Plan  ASA: II  Anesthesia Plan: General   Post-op Pain Management:    Induction: Intravenous  Airway Management Planned: Oral ETT  Additional Equipment:   Intra-op Plan:   Post-operative Plan: Extubation in OR  Informed Consent: I have reviewed the patients History and Physical, chart, labs and discussed the procedure including the risks, benefits and alternatives for the proposed anesthesia with the patient or authorized representative who has indicated his/her understanding and acceptance.   Dental advisory given  Plan Discussed with: CRNA, Anesthesiologist and Surgeon  Anesthesia Plan Comments:         Anesthesia Quick Evaluation

## 2013-11-09 NOTE — H&P (Signed)
  Glenn Ortega is an 80 y.o. male.   Chief Complaint: Back and bilateral leg pain HPI: The patient is a 80 year old gentleman was evaluated in the office a [facility] year ago for back and bilateral leg pain. He was noted hasn't epidural shots but it came back in December 2014 with markedly increasing pain. He complains of pain which was done and was legs. For pain relief difficulty walking. He underwent Studies with a myelogram sonographic CT which showed a complete block at L3-4 and L4-5 and was felt that there was probably a disc herniation L3 for contributing to the problem at L4-5 with multifactorial. At this time the options were discussed. He requested surgery now comes for bilateral decompression L3-4 and L4-5 with placement of intralaminar Coflex devices. I had a long discussion with him regarding the risks and benefits of surgical intervention. The risks discussed include but are not limited to bleeding infection weakness numbness paralysis spinal fluid leak told instrumentation coma and death. We have discussed alternative methods of the risks and benefits of nonintervention. He's had the opportunity to rest numerous questions and appears to understand. With this information in hand he has requested we proceed with surgery.  Past Medical History  Diagnosis Date  . GERD (gastroesophageal reflux disease)   . Spinal stenosis     lumbar  . Hypertension     EKG and chest x ray 8/12 [facility]/states had stress test 2 yrs ago- doesnt remember where- not in [facility]    Past Surgical History  Procedure Laterality Date  . Tonsillectomy    . Colonoscopy    . Trigger thumb  1980    right  . Cystoscopy  12/30/2011    Procedure: CYSTOSCOPY;  Surgeon: Reece Packer, MD;  Location: [facility];  Service: Urology;  Laterality: N/A;  . Hernia repair    . Prostate surgery    . Hernia repair Right     age  88    History reviewed. No pertinent family history. Social History:  reports that he quit smoking about  59 years ago. He has never used smokeless tobacco. He reports that he drinks alcohol. He reports that he does not use illicit drugs.  Allergies:  Allergies  Allergen Reactions  . Penicillins Swelling    Medications Prior to Admission  Medication Sig Dispense Refill  . losartan (COZAAR) 25 MG tablet Take 25 mg by mouth daily.       Marland Kitchen omeprazole (PRILOSEC) 20 MG capsule Take 20 mg by mouth daily with supper.       Marland Kitchen oxybutynin (DITROPAN-XL) 10 MG 24 hr tablet Take 10 mg by mouth at bedtime.      Marland Kitchen oxyCODONE-acetaminophen (PERCOCET/ROXICET) 5-325 MG per tablet Take 1-2 tablets by mouth every 6 (six) hours as needed for severe pain.  15 tablet  0    No results found for this or any previous visit (from the past 48 hour(s)). No results found.  Review of systems not obtained due to patient factors.  Blood pressure 208/85, pulse 52, temperature 98.3 F (36.8 C), temperature source Oral, resp. rate 16, SpO2 97.00%.  The patient is awake alert and oriented. His gait is markedly antalgic and slow. His reflexes are decreased but equal. He is normal strength and sensation. Assessment/Plan Impression is that of severe multifactorial stenosis L3-4 and L4-5. The plan is for decompression with Coflex.  Faythe Ghee, MD 11/09/2013, 7:34 AM

## 2013-11-09 NOTE — Anesthesia Postprocedure Evaluation (Signed)
  Anesthesia Post-op Note  Patient: Glenn Ortega  Procedure(s) Performed: Procedure(s) with comments: Lumbar Laminectomy/Decompression, Microdiscectomy Lumbar Three-Four, Four-Five, with Coflex (N/A) - Lumbar Laminectomy/Decompression, Microdiscectomy Lumbar Three-Four, Four-Five, with Coflex  Patient Location: PACU  Anesthesia Type:General  Level of Consciousness: awake, alert , oriented, sedated and patient cooperative  Airway and Oxygen Therapy: Patient Spontanous Breathing  Post-op Pain: mild  Post-op Assessment: Post-op Vital signs reviewed, Patient's Cardiovascular Status Stable, Respiratory Function Stable, Patent Airway, No signs of Nausea or vomiting and Pain level controlled  Post-op Vital Signs: stable  Complications: No apparent anesthesia complications

## 2013-11-09 NOTE — Anesthesia Procedure Notes (Signed)
Procedure Name: Intubation Date/Time: 11/09/2013 7:48 AM Performed by: Jacob Moores Pre-anesthesia Checklist: Patient identified, Emergency Drugs available, Suction available and Patient being monitored Patient Re-evaluated:Patient Re-evaluated prior to inductionOxygen Delivery Method: Circle system utilized Preoxygenation: Pre-oxygenation with 100% oxygen Intubation Type: IV induction Ventilation: Mask ventilation without difficulty Laryngoscope Size: Miller and 2 Grade View: Grade I Tube type: Oral Tube size: 7.5 mm Number of attempts: 1 Airway Equipment and Method: Stylet Placement Confirmation: ETT inserted through vocal cords under direct vision,  positive ETCO2 and breath sounds checked- equal and bilateral Secured at: 22 cm Tube secured with: Tape Dental Injury: Teeth and Oropharynx as per pre-operative assessment

## 2013-11-09 NOTE — Transfer of Care (Signed)
Immediate Anesthesia Transfer of Care Note  Patient: Glenn Ortega  Procedure(s) Performed: Procedure(s) with comments: Lumbar Laminectomy/Decompression, Microdiscectomy Lumbar Three-Four, Four-Five, with Coflex (N/A) - Lumbar Laminectomy/Decompression, Microdiscectomy Lumbar Three-Four, Four-Five, with Coflex  Patient Location: PACU  Anesthesia Type:General  Level of Consciousness: awake and alert   Airway & Oxygen Therapy: Patient Spontanous Breathing and Patient connected to nasal cannula oxygen  Post-op Assessment: Report given to PACU RN, Post -op Vital signs reviewed and stable and Patient moving all extremities X 4  Post vital signs: Reviewed and stable  Complications: No apparent anesthesia complications

## 2013-11-09 NOTE — Progress Notes (Signed)
Notified Dr. Al Corpus of BP readings

## 2013-11-09 NOTE — Progress Notes (Signed)
ANTIBIOTIC CONSULT NOTE - INITIAL  Pharmacy Consult for vancomycin  Indication: surgical prophylaxis  Allergies  Allergen Reactions  . Penicillins Swelling    Patient Measurements:   Adjusted Body Weight:   Vital Signs: Temp: 96.8 F (36 C) (01/23 1237) Temp src: Oral (01/23 0558) BP: 151/69 mmHg (01/23 1237) Pulse Rate: 59 (01/23 1237) Intake/Output from previous day:   Intake/Output from this shift: Total I/O In: 1800 [I.V.:1800] Out: 250 [Drains:50; Blood:200]  Labs: No results found for this basename: WBC, HGB, PLT, LABCREA, CREATININE,  in the last 72 hours The CrCl is unknown because both a height and weight (above a minimum accepted value) are required for this calculation. No results found for this basename: VANCOTROUGH, [name], VANCORANDOM, GENTTROUGH, GENTPEAK, GENTRANDOM, TOBRATROUGH, TOBRAPEAK, TOBRARND, AMIKACINPEAK, AMIKACINTROU, AMIKACIN,  in the last 72 hours   Microbiology: Recent Results (from the past 720 hour(s))  SURGICAL PCR SCREEN     Status: None   Collection Time    11/02/13  9:49 AM      Result Value Range Status   MRSA, PCR NEGATIVE  NEGATIVE Final   Staphylococcus aureus NEGATIVE  NEGATIVE Final   Comment:            The Xpert SA Assay (FDA     approved for NASAL specimens     in patients over 110 years of age),     is one component of     a comprehensive surveillance     program.  Test performance has     been validated by [name] for patients greater     than or equal to 35 year old.     It is not intended     to diagnose infection nor to     guide or monitor treatment.    Medical History: Past Medical History  Diagnosis Date  . GERD (gastroesophageal reflux disease)   . Spinal stenosis     lumbar  . Hypertension     EKG and chest x ray 8/12 EPIC/states had stress test 2 yrs ago- doesnt remember where- not in EPIC    Medications:  Scheduled:  . dexamethasone  4 mg Intravenous Q6H   Or  . dexamethasone  4 mg  Oral Q6H  . HYDROmorphone      . ketorolac  30 mg Intravenous Q6H  . losartan  25 mg Oral Daily  . oxybutynin  10 mg Oral QHS  . pantoprazole (PROTONIX) IV  40 mg Intravenous QHS  . sodium chloride  3 mL Intravenous Q12H  . vancomycin       Infusions:  . sodium chloride    . dextrose 5 % and 0.45 % NaCl with KCl 20 mEq/L     Assessment: 80 yo male s/p neuro surgery will be put on vancomycin for surgical prophylaxis. Patient had one dose of vancomycin 1g iv x1 at 0745 on 11/09/13.  Wt 97.5 kg and HT 173 cm. SCr on 01/16 was 1.13 (CrCl ~53). Per RN, patient does have a drain  Goal of Therapy:  Vancomycin trough level 15-20 mcg/ml  Plan:  1) Vancomycin 1500mg  iv q24h, 1st dose at midnight 2) Follow up on plan of antibiotic before checking vancomycin trough.  Glenn Ortega, Glenn Ortega 11/09/2013,1:36 PM

## 2013-11-10 MED ORDER — CYCLOBENZAPRINE HCL 10 MG PO TABS: 10.0000 mg | ORAL_TABLET | Freq: Three times a day (TID) | ORAL | Status: DC | PRN

## 2013-11-10 MED ORDER — OXYCODONE-ACETAMINOPHEN 5-325 MG/5ML PO SOLN: 5.0000 mL | ORAL | Status: DC | PRN

## 2013-11-10 NOTE — Discharge Summary (Signed)
  Physician Discharge Summary  Patient ID: MAVERIK FOOT MRN: [id number] DOB/AGE: 78/11/1933 78 y.o.  Admit date: 11/09/2013 Discharge date: 11/10/2013  Admission Diagnoses: Spinal stenosis L3-4, L4-5  Discharge Diagnoses: Same Active Problems:   Lumbar spinal stenosis   Discharged Condition: Stable  Hospital Course:  Mrs. Glenn Ortega is a 80 y.o. male electively admitted after undergoing uncomplicated X7-9 and T9-0 laminectomy. Postoperatively, he was at his neurologic baseline with improvement in his preoperative pain symptoms. He is ambulating without difficulty, with pain under control, tolerating diet, voiding without difficulty.  Treatments: Surgery - L3-4, L4-5 laminectomy, L3-4 microdiscectomy  Discharge Exam: Blood pressure 125/67, pulse 61, temperature 98.2 F (36.8 C), temperature source Oral, resp. rate 18, SpO2 93.00%. Awake, alert, oriented Speech fluent, appropriate CN grossly intact 5/5 BUE/BLE Wound c/d/i  Follow-up: Follow-up in Dr. Sande Rives office [facility] [phone]) in 2-3 weeks  Disposition: 01-Home or Self Care  Discharge Orders   Future Orders Complete By Expires   Discharge patient  As directed        Medication List    STOP taking these medications       oxyCODONE-acetaminophen 5-325 MG per tablet  Commonly known as:  PERCOCET/ROXICET  Replaced by:  oxyCODONE-acetaminophen 5-325 MG/5ML solution      TAKE these medications       cyclobenzaprine 10 MG tablet  Commonly known as:  FLEXERIL  Take 1 tablet (10 mg total) by mouth 3 (three) times daily as needed for muscle spasms.     losartan 25 MG tablet  Commonly known as:  COZAAR  Take 25 mg by mouth daily.     omeprazole 20 MG capsule  Commonly known as:  PRILOSEC  Take 20 mg by mouth daily with supper.     oxybutynin 10 MG 24 hr tablet  Commonly known as:  DITROPAN-XL  Take 10 mg by mouth at bedtime.     oxyCODONE-acetaminophen 5-325 MG/5ML solution   Commonly known as:  ROXICET  Take 5 mLs by mouth every 4 (four) hours as needed for severe pain.         SignedConsuella Lose, C 11/10/2013, 11:24 AM

## 2013-11-10 NOTE — Progress Notes (Signed)
Pt and wife given D/C instructions with Rx's, verbal understanding was given. Pt D/C'd home via wheelchair @ 1305 per MD order. Pt was stable @ D/C and had no other needs. Holli Humbles, RN

## 2013-11-13 ENCOUNTER — Encounter (HOSPITAL_COMMUNITY): Payer: Self-pay | Admitting: Neurosurgery

## 2014-03-30 ENCOUNTER — Emergency Department (HOSPITAL_COMMUNITY)
Admission: EM | Admit: 2014-03-30 | Discharge: 2014-03-30 | Disposition: A | Discharge status: Home / Self Care | Payer: Medicare HMO | Attending: Emergency Medicine | Admitting: Emergency Medicine

## 2014-03-30 ENCOUNTER — Emergency Department (HOSPITAL_COMMUNITY): Payer: Medicare HMO

## 2014-03-30 ENCOUNTER — Encounter (HOSPITAL_COMMUNITY): Payer: Self-pay | Admitting: Emergency Medicine

## 2014-03-30 DIAGNOSIS — Z8739 Personal history of other diseases of the musculoskeletal system and connective tissue: Secondary | ICD-10-CM | POA: Insufficient documentation

## 2014-03-30 DIAGNOSIS — T25219A Burn of second degree of unspecified ankle, initial encounter: Secondary | ICD-10-CM | POA: Insufficient documentation

## 2014-03-30 DIAGNOSIS — T25211A Burn of second degree of right ankle, initial encounter: Secondary | ICD-10-CM

## 2014-03-30 DIAGNOSIS — X19XXXA Contact with other heat and hot substances, initial encounter: Secondary | ICD-10-CM | POA: Insufficient documentation

## 2014-03-30 DIAGNOSIS — Z880 Allergy status to penicillin: Secondary | ICD-10-CM | POA: Insufficient documentation

## 2014-03-30 DIAGNOSIS — Y939 Activity, unspecified: Secondary | ICD-10-CM | POA: Insufficient documentation

## 2014-03-30 DIAGNOSIS — Z792 Long term (current) use of antibiotics: Secondary | ICD-10-CM | POA: Insufficient documentation

## 2014-03-30 DIAGNOSIS — K219 Gastro-esophageal reflux disease without esophagitis: Secondary | ICD-10-CM | POA: Insufficient documentation

## 2014-03-30 DIAGNOSIS — Z87891 Personal history of nicotine dependence: Secondary | ICD-10-CM | POA: Insufficient documentation

## 2014-03-30 DIAGNOSIS — Z79899 Other long term (current) drug therapy: Secondary | ICD-10-CM | POA: Insufficient documentation

## 2014-03-30 DIAGNOSIS — Y929 Unspecified place or not applicable: Secondary | ICD-10-CM | POA: Insufficient documentation

## 2014-03-30 DIAGNOSIS — I10 Essential (primary) hypertension: Secondary | ICD-10-CM | POA: Insufficient documentation

## 2014-03-30 MED ORDER — DOXYCYCLINE HYCLATE 100 MG PO CAPS: 100.0000 mg | ORAL_CAPSULE | Freq: Two times a day (BID) | ORAL | Status: DC

## 2014-03-30 MED ORDER — SILVER SULFADIAZINE 1 % EX CREA: 1.0000 "application " | TOPICAL_CREAM | Freq: Every day | CUTANEOUS | Status: DC
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
## 2014-03-30 NOTE — Discharge Instructions (Signed)
Take doxycycline as prescribed. Apply silvadene daily to prevent infection. Change your dressing daily. Follow up with your primary doctor on Monday.  Burn Care Your skin is a natural barrier to infection. It is the largest organ of your body. Burns damage this natural protection. To help prevent infection, it is very important to follow your caregiver's instructions in the care of your burn. Burns are classified as:  First degree. There is only redness of the skin (erythema). No scarring is expected.  Second degree. There is blistering of the skin. Scarring may occur with deeper burns.  Third degree. All layers of the skin are injured, and scarring is expected. HOME CARE INSTRUCTIONS   Wash your hands well before changing your bandage.  Change your bandage as often as directed by your caregiver.  Remove the old bandage. If the bandage sticks, you may soak it off with cool, clean water.  Cleanse the burn thoroughly but gently with mild soap and water.  Pat the area dry with a clean, dry cloth.  Apply a thin layer of antibacterial cream to the burn.  Apply a clean bandage as instructed by your caregiver.  Keep the bandage as clean and dry as possible.  Elevate the affected area for the first 24 hours, then as instructed by your caregiver.  Only take over-the-counter or prescription medicines for pain, discomfort, or fever as directed by your caregiver. SEEK IMMEDIATE MEDICAL CARE IF:   You develop excessive pain.  You develop redness, tenderness, swelling, or red streaks near the burn.  The burned area develops yellowish-white fluid (pus) or a bad smell.  You have a fever. MAKE SURE YOU:   Understand these instructions.  Will watch your condition.  Will get help right away if you are not doing well or get worse. Document Released: 10/04/2005 Document Revised: 12/27/2011 Document Reviewed: 02/24/2011 [facility] Patient Information 2014 [place], [place].
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
## 2014-04-08 ENCOUNTER — Ambulatory Visit (INDEPENDENT_AMBULATORY_CARE_PROVIDER_SITE_OTHER): Payer: Medicare HMO | Admitting: Family Medicine

## 2014-04-08 ENCOUNTER — Encounter: Payer: Self-pay | Admitting: Family Medicine

## 2014-04-08 VITALS — BP 144/67 | HR 61 | Ht 68.0 in | Wt 228.0 lb

## 2014-04-08 DIAGNOSIS — K219 Gastro-esophageal reflux disease without esophagitis: Secondary | ICD-10-CM

## 2014-04-08 DIAGNOSIS — T25219A Burn of second degree of unspecified ankle, initial encounter: Secondary | ICD-10-CM

## 2014-04-08 DIAGNOSIS — T25211D Burn of second degree of right ankle, subsequent encounter: Secondary | ICD-10-CM

## 2014-04-08 DIAGNOSIS — Z23 Encounter for immunization: Secondary | ICD-10-CM

## 2014-04-08 DIAGNOSIS — I10 Essential (primary) hypertension: Secondary | ICD-10-CM

## 2014-04-08 DIAGNOSIS — G609 Hereditary and idiopathic neuropathy, unspecified: Secondary | ICD-10-CM

## 2014-04-08 MED ORDER — GABAPENTIN 100 MG PO CAPS: 100.0000 mg | ORAL_CAPSULE | Freq: Every day | ORAL | Status: DC

## 2014-04-08 MED ORDER — LOSARTAN POTASSIUM 25 MG PO TABS: 25.0000 mg | ORAL_TABLET | Freq: Every day | ORAL | Status: DC

## 2014-04-08 MED ORDER — OMEPRAZOLE 20 MG PO CPDR: 20.0000 mg | DELAYED_RELEASE_CAPSULE | Freq: Every day | ORAL | Status: DC | PRN
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
## 2014-05-29 LAB — COMPLETE METABOLIC PANEL WITH GFR
ALT: 13 U/L (ref 0–53)
AST: 16 U/L (ref 0–37)
Albumin: 4.1 g/dL (ref 3.5–5.2)
Alkaline Phosphatase: 43 U/L (ref 39–117)
BILIRUBIN TOTAL: 0.5 mg/dL (ref 0.2–1.2)
BUN: 21 mg/dL (ref 6–23)
CALCIUM: 9.2 mg/dL (ref 8.4–10.5)
CHLORIDE: 105 meq/L (ref 96–112)
CO2: 26 mEq/L (ref 19–32)
CREATININE: 1.01 mg/dL (ref 0.50–1.35)
GFR, Est African American: 83 mL/min
GFR, Est Non African American: 71 mL/min
Glucose, Bld: 93 mg/dL (ref 70–99)
Potassium: 4.1 mEq/L (ref 3.5–5.3)
Sodium: 139 mEq/L (ref 135–145)
Total Protein: 6.9 g/dL (ref 6.0–8.3)

## 2014-05-29 LAB — LIPID PANEL
CHOLESTEROL: 173 mg/dL (ref 0–200)
HDL: 58 mg/dL (ref >39)
LDL CALC: 90 mg/dL (ref 0–99)
TRIGLYCERIDES: 123 mg/dL (ref <150)
Total CHOL/HDL Ratio: 3 Ratio
VLDL: 25 mg/dL (ref 0–40)

## 2014-05-29 NOTE — Progress Notes (Signed)
Quick Note:  All labs are normal. ______ 

## 2014-06-10 ENCOUNTER — Ambulatory Visit: Payer: Medicare Other | Admitting: Family Medicine

## 2014-06-18 ENCOUNTER — Ambulatory Visit (INDEPENDENT_AMBULATORY_CARE_PROVIDER_SITE_OTHER): Payer: Medicare HMO | Admitting: Family Medicine

## 2014-06-18 ENCOUNTER — Encounter: Payer: Self-pay | Admitting: Family Medicine

## 2014-06-18 VITALS — BP 110/64 | HR 76 | Wt 220.0 lb

## 2014-06-18 DIAGNOSIS — G609 Hereditary and idiopathic neuropathy, unspecified: Secondary | ICD-10-CM

## 2014-06-18 DIAGNOSIS — Z23 Encounter for immunization: Secondary | ICD-10-CM

## 2014-06-18 DIAGNOSIS — L570 Actinic keratosis: Secondary | ICD-10-CM

## 2014-06-18 MED ORDER — PREGABALIN 75 MG PO CAPS: 75.0000 mg | ORAL_CAPSULE | Freq: Two times a day (BID) | ORAL | Status: DC

## 2014-06-18 NOTE — Progress Notes (Signed)
   Subjective:    Patient ID: Glenn Ortega, male    DOB: 01/14/1934, 80 y.o.   MRN: [id number]  HPI Neuropathy - says the gabapentin didn't work very well.  Says it wakes him up at night and then last several minutes.  Says often triggered by position change.   He bumped his head 3 months ago, Says got better but didn't heal completely. Still has a scab there.  No pain or fever or drainage from the wound    Review of Systems     Objective:   Physical Exam  Constitutional: He appears well-developed and well-nourished.  HENT:  Head: Normocephalic and atraumatic.  Eyes: Conjunctivae are normal. Pupils are equal, round, and reactive to light.  Skin: Skin is warm and dry.  On his scalp his head he has a crusty actinic keratosis.  Psychiatric: He has a normal mood and affect. His behavior is normal.          Assessment & Plan:  Peripheral neuropathy - we discussed different options. He actually took a fairly low dose of gabapentin, 200 mg. We discussed this actually very wide range of dosing on this particular medication and he not respond to a higher dose. We also discussed maybe switching him to Lyrica. And that is more costly than gabapentin but certainly we could try if he would like. He says he would like to try the Lyrica instead. Prescription given today so he can take it to the [place].  Actinic keratosis-cryotherapy performed today. Patient tolerated well.  Flu shot given today.    Cryotherapy Procedure Note  Pre-operative Diagnosis: Actinic keratosis  Post-operative Diagnosis: Actinic keratosis  Locations: crown of head   Indications: "not healing"   Anesthesia: not required   and a Procedure Details   Patient informed of risks (permanent scarring, infection, light or dark discoloration, bleeding, infection, weakness, numbness and recurrence of the lesion) and benefits of the procedure and verbal informed consent obtained.  The areas are treated with liquid nitrogen  therapy, frozen until ice ball extended in 2 mm beyond lesion, allowed to thaw, and treated again. The patient tolerated procedure well.  The patient was instructed on post-op care, warned that there may be blister formation, redness and pain. Recommend OTC analgesia as needed for pain.  Condition: Stable  Complications: none.  Plan: 1. Instructed to keep the area dry and covered for 24-48h and clean thereafter. 2. Warning signs of infection were reviewed.   3. Recommended that the patient use OTC acetaminophen as needed for pain.  4. Return PRN.Marland Kitchen

## 2014-06-26 ENCOUNTER — Other Ambulatory Visit: Payer: Self-pay

## 2014-06-26 DIAGNOSIS — K219 Gastro-esophageal reflux disease without esophagitis: Secondary | ICD-10-CM

## 2014-06-26 MED ORDER — OMEPRAZOLE 20 MG PO CPDR: 20.0000 mg | DELAYED_RELEASE_CAPSULE | Freq: Every day | ORAL | Status: DC | PRN

## 2014-07-30 ENCOUNTER — Telehealth: Payer: Self-pay

## 2014-07-30 NOTE — Telephone Encounter (Signed)
An e-mail was sent to [facility] for office visit 04/08/2014 regarding wrong NDC number for the T-dap. The number has been corrected.    Glenn, Ortega 1933/11/04 MRN [id number] has a bill for a T-dap injection. The T-dap was summited with a NDC of [phone] and the correct NDC is [phone]. [employer] refused to pay due to the wrong [place] number. I have up dated the chart to reflect the correct [place] number. Hopefully all that is needed is to refile the claim. Please let me know if you need anymore information from me.   Thank you,   [name]. [name], [name]

## 2014-08-01 NOTE — Telephone Encounter (Signed)
Thank you. I will leave the letter Dr Suzi Roots received from [place] in your office. He did have a head injury that was documented on the office note for the same day visit.

## 2014-08-01 NOTE — Telephone Encounter (Signed)
Levada Dy,  I responded to your email...  Tdap is normally not covered under Medicare or Medicare HMO plan unless its related into injury. This is why I think the charge did not get reimbursed. Do you have an explanation of benefit from [facility] stating charge was not paid because of invalid NDC?   Rodman Key

## 2014-11-01 ENCOUNTER — Telehealth: Payer: Self-pay | Admitting: Family Medicine

## 2014-11-01 DIAGNOSIS — K219 Gastro-esophageal reflux disease without esophagitis: Secondary | ICD-10-CM

## 2014-11-01 MED ORDER — LOSARTAN POTASSIUM 25 MG PO TABS: 25.0000 mg | ORAL_TABLET | Freq: Every day | ORAL | Status: DC

## 2014-11-01 MED ORDER — OMEPRAZOLE 20 MG PO CPDR: 20.0000 mg | DELAYED_RELEASE_CAPSULE | Freq: Every day | ORAL | Status: DC | PRN

## 2014-11-01 NOTE — Telephone Encounter (Signed)
Glenn Ortega called. He needs a refill on Losartan and Omeprazole(he has been getting refills through the [facility]) New Pharmacy information: [facility]/Ph # [id number] #[phone]/Account # [phone]. He has a Dr's appt on 1/20.  Thank you.

## 2014-11-01 NOTE — Telephone Encounter (Signed)
Refills sent.Medea Deines Lynetta  

## 2014-11-06 ENCOUNTER — Encounter: Payer: Self-pay | Admitting: Family Medicine

## 2014-11-06 ENCOUNTER — Ambulatory Visit (INDEPENDENT_AMBULATORY_CARE_PROVIDER_SITE_OTHER): Payer: Medicare Other | Admitting: Family Medicine

## 2014-11-06 VITALS — BP 131/62 | HR 62 | Wt 228.0 lb

## 2014-11-06 DIAGNOSIS — G629 Polyneuropathy, unspecified: Secondary | ICD-10-CM

## 2014-11-06 MED ORDER — GABAPENTIN 100 MG PO CAPS: 100.0000 mg | ORAL_CAPSULE | Freq: Every day | ORAL | Status: DC

## 2014-11-06 MED ORDER — AMBULATORY NON FORMULARY MEDICATION: Status: DC

## 2014-11-06 NOTE — Progress Notes (Signed)
   Subjective:    Patient ID: Glenn Ortega, male    DOB: 08-20-1934, 81 y.o.   MRN: [id number]  HPI Burning in both feet.  About 2 yr ago had prostate surgery at [facility] and feels like started around that time. He then had back surgery on L3-L4. He has a plate in place. And he feels like it got worse after that. Says it seems positional. Says can lay in a certain positional esp at night. Says can turn a certain way and it will last about 10-15 miutes. Occ will happen in the recliner and then can happen. Doesn't happen when he is exercising.     Review of Systems     Objective:   Physical Exam  Constitutional: He is oriented to person, place, and time. He appears well-developed and well-nourished.  Musculoskeletal:  Foot with very dry scaly skin especially over the toes. He has very dystrophic toenails. No sign of wound or ulceration. Dorsal pedal pulses and posterior tibial pulses 2+ bilaterally. Ankles with normal range of motion. No excess laxity of the ankles. Gen. sensation is intact. Nontender over the shins. Some trace ankle edema bilaterally.  Neurological: He is alert and oriented to person, place, and time.  Skin: Skin is warm and dry.  Psychiatric: He has a normal mood and affect. His behavior is normal.          Assessment & Plan:  Neuropathy, bilateral feet-in the distribution of S1. Suspect secondary to herniated disc at S1. He's not having significant back pain or discomfort at this time and is not interested in any interventions or surgery. Since he is not experiencing any weakness of the lower extremities then recommend treatment of the neuropathy with gabapentin. We'll start with 100 mg about 2 hours before bedtime and it can increase slowly up to 4 tabs as needed. We had tried previously prescribing Lyrica but it was not covered at the [place].

## 2014-11-14 ENCOUNTER — Encounter: Payer: Medicare Other | Admitting: Sports Medicine

## 2014-11-25 ENCOUNTER — Ambulatory Visit (INDEPENDENT_AMBULATORY_CARE_PROVIDER_SITE_OTHER): Payer: Medicare Other | Admitting: Sports Medicine

## 2014-11-25 ENCOUNTER — Encounter: Payer: Self-pay | Admitting: Sports Medicine

## 2014-11-25 VITALS — BP 129/71 | HR 72 | Ht 68.0 in | Wt 225.0 lb

## 2014-11-25 DIAGNOSIS — M4806 Spinal stenosis, lumbar region: Secondary | ICD-10-CM | POA: Diagnosis not present

## 2014-11-25 DIAGNOSIS — M48061 Spinal stenosis, lumbar region without neurogenic claudication: Secondary | ICD-10-CM

## 2014-11-25 NOTE — Assessment & Plan Note (Signed)
Status post L3-L4 and L4-L5 discectomies with fusion for lumbar spinal stenosis, continues to have residual bilateral L5 radiculopathy, is currently doing the gabapentin up taper appropriately. We will increase 100 mg every week until symptom relief. Custom orthotics as above. I would like to see him back in about a month to see how things are going.

## 2014-11-25 NOTE — Progress Notes (Signed)

## 2014-12-23 ENCOUNTER — Ambulatory Visit (INDEPENDENT_AMBULATORY_CARE_PROVIDER_SITE_OTHER): Payer: Medicare Other | Admitting: Sports Medicine

## 2014-12-23 ENCOUNTER — Encounter: Payer: Self-pay | Admitting: Sports Medicine

## 2014-12-23 VITALS — BP 154/76 | HR 80 | Wt 228.0 lb

## 2014-12-23 DIAGNOSIS — M4806 Spinal stenosis, lumbar region: Secondary | ICD-10-CM | POA: Diagnosis not present

## 2014-12-23 DIAGNOSIS — M48061 Spinal stenosis, lumbar region without neurogenic claudication: Secondary | ICD-10-CM

## 2014-12-23 NOTE — Progress Notes (Signed)
  Subjective:    CC: Follow-up  HPI: This is a pleasant 81 year old male with severe lumbar spinal stenosis post L3-L4 right L4-L5 fusion, he has been well managed by spine surgeon, he did come in desiring orthotics, and is doing very well, we made the orthotics last month. He currently does about 3-400 mg of gabapentin at bedtime, and does not have daytime symptoms.  Past medical history, Surgical history, Family history not pertinant except as noted below, Social history, Allergies, and medications have been entered into the medical record, reviewed, and no changes needed.   Review of Systems: No fevers, chills, night sweats, weight loss, chest pain, or shortness of breath.   Objective:    General: Well Developed, well nourished, and in no acute distress.  Neuro: Alert and oriented x3, extra-ocular muscles intact, sensation grossly intact.  HEENT: Normocephalic, atraumatic, pupils equal round reactive to light, neck supple, no masses, no lymphadenopathy, thyroid nonpalpable.  Skin: Warm and dry, no rashes. Cardiac: Regular rate and rhythm, no murmurs rubs or gallops, no lower extremity edema.  Respiratory: Clear to auscultation bilaterally. Not using accessory muscles, speaking in full sentences.  Impression and Recommendations:

## 2014-12-23 NOTE — Assessment & Plan Note (Signed)
Doing very well with orthotics, doesn't have pain during the day, uses 3-400 mg of gabapentin at night, and continues to do well. Return as needed. Continue close follow-up with spine surgery.

## 2015-01-24 ENCOUNTER — Other Ambulatory Visit: Payer: Self-pay | Admitting: Family Medicine

## 2015-01-28 ENCOUNTER — Ambulatory Visit (INDEPENDENT_AMBULATORY_CARE_PROVIDER_SITE_OTHER): Payer: Medicare Other | Admitting: Family Medicine

## 2015-01-28 ENCOUNTER — Encounter: Payer: Self-pay | Admitting: Family Medicine

## 2015-01-28 VITALS — BP 101/57 | HR 54 | Ht 68.0 in | Wt 230.0 lb

## 2015-01-28 DIAGNOSIS — G5601 Carpal tunnel syndrome, right upper limb: Secondary | ICD-10-CM

## 2015-01-28 DIAGNOSIS — G5602 Carpal tunnel syndrome, left upper limb: Secondary | ICD-10-CM

## 2015-01-28 DIAGNOSIS — L570 Actinic keratosis: Secondary | ICD-10-CM

## 2015-01-28 DIAGNOSIS — G5603 Carpal tunnel syndrome, bilateral upper limbs: Secondary | ICD-10-CM

## 2015-01-28 NOTE — Progress Notes (Signed)
   Subjective:    Patient ID: Glenn Ortega, male    DOB: Nov 29, 1933, 81 y.o.   MRN: [id number]  HPI Tingling in the right hand and thigh. Right hand tingling in in the thumb, first and middle finger.  Says feels numb like has burned himself.  Says sometimes the hand feels like it falls asleep.  No wrist pain.  History of lumbar spinal stenosis status post L3-4 and L4-5 fusion. He does take gabapentin at bedtime. He denies any neck pain or recent injury. He doesn't have numbness into healing at night.  It bothersome most well he's driving and has his hands on the steering well. He does not take any medication for this.  He also has a lesion on his scalp he would like me to look at today. He has significant hair loss. We did cryotherapy to an actinic keratosis previously. He feels like it's starting to come back. No pain, redness.    Review of Systems     Objective:   Physical Exam  Constitutional: He appears well-nourished.  HENT:  Head: Normocephalic and atraumatic.  Neck: Neck supple. No thyromegaly present.  Musculoskeletal:  Neck with normal flexion, dec extension and dec rotation right and left but symmetric.  Neg spurlings bilat.  Shoulders with NROM.  Non-tender hands, wrists and fingers. Pos tinnels on the right, neg on the left.  Neg phalens bilaterally.    Skin:  3 small AKS on the scalp.            Assessment & Plan:  Bilat carpal tunnel most likely - will tx conservatively with bilat nightime wrist splints and hand out with additional information. Oc for oc NSAID.  Follow-up in one month. If not improving consider cervical pathology.  AK - crytherapy performed.   Cryotherapy Procedure Note  Pre-operative Diagnosis: Actinic keratosis x 3   Post-operative Diagnosis: Actinic keratosis  Locations: scalp on right      Indications: precancerous  Anesthesia: not required    Procedure Details  Patient informed of risks (permanent scarring, infection, light or dark  discoloration, bleeding, infection, weakness, numbness and recurrence of the lesion) and benefits of the procedure and verbal informed consent obtained.  The areas are treated with liquid nitrogen therapy, frozen until ice ball extended 2 mm beyond lesion, allowed to thaw, and treated again. The patient tolerated procedure well.  The patient was instructed on post-op care, warned that there may be blister formation, redness and pain. Recommend OTC analgesia as needed for pain.  Condition: Stable  Complications: none.  Plan: 1. Instructed to keep the area dry and covered for 24-48h and clean thereafter. 2. Warning signs of infection were reviewed.   3. Recommended that the patient use OTC acetaminophen as needed for pain.  4. Return in PRN.

## 2015-02-25 ENCOUNTER — Ambulatory Visit (INDEPENDENT_AMBULATORY_CARE_PROVIDER_SITE_OTHER): Payer: Medicare Other | Admitting: Family Medicine

## 2015-02-25 ENCOUNTER — Encounter: Payer: Self-pay | Admitting: Family Medicine

## 2015-02-25 VITALS — BP 136/74 | HR 60 | Wt 228.0 lb

## 2015-02-25 DIAGNOSIS — G5603 Carpal tunnel syndrome, bilateral upper limbs: Secondary | ICD-10-CM

## 2015-02-25 DIAGNOSIS — G5601 Carpal tunnel syndrome, right upper limb: Secondary | ICD-10-CM

## 2015-02-25 DIAGNOSIS — G5602 Carpal tunnel syndrome, left upper limb: Secondary | ICD-10-CM

## 2015-02-25 DIAGNOSIS — R252 Cramp and spasm: Secondary | ICD-10-CM

## 2015-02-25 LAB — COMPLETE METABOLIC PANEL WITH GFR
ALT: 17 U/L (ref 0–53)
AST: 17 U/L (ref 0–37)
Albumin: 3.9 g/dL (ref 3.5–5.2)
Alkaline Phosphatase: 43 U/L (ref 39–117)
BILIRUBIN TOTAL: 0.6 mg/dL (ref 0.2–1.2)
BUN: 21 mg/dL (ref 6–23)
CALCIUM: 9 mg/dL (ref 8.4–10.5)
CO2: 26 mEq/L (ref 19–32)
Chloride: 104 mEq/L (ref 96–112)
Creat: 0.96 mg/dL (ref 0.50–1.35)
GFR, EST AFRICAN AMERICAN: 87 mL/min
GFR, Est Non African American: 75 mL/min
Glucose, Bld: 91 mg/dL (ref 70–99)
POTASSIUM: 4.5 meq/L (ref 3.5–5.3)
SODIUM: 138 meq/L (ref 135–145)
TOTAL PROTEIN: 6.9 g/dL (ref 6.0–8.3)

## 2015-02-25 LAB — TSH: TSH: 3.165 u[IU]/mL (ref 0.350–4.500)

## 2015-02-25 LAB — MAGNESIUM: Magnesium: 1.9 mg/dL (ref 1.5–2.5)

## 2015-02-25 NOTE — Progress Notes (Signed)
   Subjective:    Patient ID: Glenn Ortega, male    DOB: 1934/07/18, 81 y.o.   MRN: [id number]  HPI Bilat carpal tunnel - has been wearing the ngiht splints. Reports about 30-40% improvement. Worse with repetitive movements.  Has been doing his exercises as well.   Leg cramp have been happening on night. Says worse when takes his gabapentin so really hasn't been taking it.  Says started cramping a month ago.  Not in upper extremities. He denies any new exercise or activities.  Review of Systems     Objective:   Physical Exam  Constitutional: He is oriented to person, place, and time. He appears well-developed and well-nourished.  HENT:  Head: Normocephalic and atraumatic.  Musculoskeletal: He exhibits no edema.  Wrists with normal range of motion. Slight decreased sensation of the thumb first and second fingers at the tips.  Neurological: He is alert and oriented to person, place, and time.  Skin: Skin is warm and dry.  Psychiatric: He has a normal mood and affect. His behavior is normal.          Assessment & Plan:  Bilat carpal tunnel.  I'm happy that he's had a 30-40% improvement in his symptoms. We'll continue with the wrist splint since it does seem to be helping, as well as the exercises. Encouraged him to continue therapy for at least 12 weeks. If at that point he still having persistent numbness in the tips of the thumb first and second fingers then please let me know and I will schedule him with Dr. Aundria Mems for carpal tunnel injection.  Leg cramps-check for abnormality of potassium, magnesium, thyroid level. He denies any new change in activity level or hydration status.

## 2015-02-26 NOTE — Progress Notes (Signed)
Quick Note:  All labs are normal. ______ 

## 2015-05-30 ENCOUNTER — Other Ambulatory Visit: Payer: Self-pay | Admitting: Family Medicine

## 2015-08-13 ENCOUNTER — Ambulatory Visit: Payer: Medicare Other | Admitting: Family Medicine

## 2015-08-16 ENCOUNTER — Other Ambulatory Visit: Payer: Self-pay | Admitting: Family Medicine

## 2015-08-18 ENCOUNTER — Ambulatory Visit (INDEPENDENT_AMBULATORY_CARE_PROVIDER_SITE_OTHER): Payer: Medicare Other | Admitting: Family Medicine

## 2015-08-18 ENCOUNTER — Encounter: Payer: Self-pay | Admitting: Family Medicine

## 2015-08-18 VITALS — BP 119/68 | HR 59 | Ht 68.0 in | Wt 230.0 lb

## 2015-08-18 VITALS — BP 119/68 | HR 59 | Temp 97.4°F | Ht 68.0 in | Wt 230.0 lb

## 2015-08-18 DIAGNOSIS — G5601 Carpal tunnel syndrome, right upper limb: Secondary | ICD-10-CM | POA: Diagnosis not present

## 2015-08-18 DIAGNOSIS — G5603 Carpal tunnel syndrome, bilateral upper limbs: Secondary | ICD-10-CM | POA: Diagnosis not present

## 2015-08-18 DIAGNOSIS — Z23 Encounter for immunization: Secondary | ICD-10-CM

## 2015-08-18 DIAGNOSIS — I10 Essential (primary) hypertension: Secondary | ICD-10-CM | POA: Diagnosis not present

## 2015-08-18 NOTE — Progress Notes (Signed)
   Subjective:    Patient ID: Glenn Ortega, male    DOB: October 06, 1934, 81 y.o.   MRN: [id number]  HPI  patient comes in today to follow back up on bilateral carpal tunnel which is worse on his right wrist. He feels like his fingers are now staying numb most of the time and it's getting a little bit worse. The he denies any weakness in his hands. I saw him about 6 months ago and we have put him in splints and gave him some exercises to work on. At that point in time he actually had about a 30-40% improvement but says since then it has gradually gotten worse.  Hypertension- Pt denies chest pain, SOB, dizziness, or heart palpitations.  Taking meds as directed w/o problems.  Denies medication side effects.      Review of Systems     Objective:   Physical Exam  Constitutional: He is oriented to person, place, and time. He appears well-developed and well-nourished.  HENT:  Head: Normocephalic and atraumatic.  Cardiovascular: Normal rate, regular rhythm and normal heart sounds.   Pulmonary/Chest: Effort normal and breath sounds normal.  Musculoskeletal: He exhibits no edema.  Right wrist with decreased flexion and extension compared to his left. Has a lot of stiffness. Radial pulses 2+. I do feel like he has a little bit of atrophy of the theme are eminence on the right compared to the left. Strength in the thumb, first, second and third fingers is 5 out of 5. Negative controls. Negative Phalen's.  Neurological: He is alert and oriented to person, place, and time.  Skin: Skin is warm and dry.  Psychiatric: He has a normal mood and affect. His behavior is normal.          Assessment & Plan:  Bilateral carpal tunnel syndrome, worse on the right-we discussed treatment options including injection versus moving forward with EMG study.. He opts for injection.  HTN -  Well-controlled. Continue current regimen. Labs do. Lab slip provided today. Follow up in 6 months.

## 2015-08-18 NOTE — Progress Notes (Signed)
   Subjective:    I'm seeing this patient as a consultation for:  Dr Madilyn Fireman  CC: Carpal tunnel syndrome  HPI: Patient has ongoing symptoms thought to be related to carpal tunnel syndrome. He notes numbness in the first 3 digits of his right hand associated with weakness. This is been present for months and has persisted despite conservative treatment including bracing. He denies any injury or neck pain fevers chills nausea vomiting or diarrhea.  Past medical history, Surgical history, Family history not pertinant except as noted below, Social history, Allergies, and medications have been entered into the medical record, reviewed, and no changes needed.   Review of Systems: No headache, visual changes, nausea, vomiting, diarrhea, constipation, dizziness, abdominal pain, skin rash, fevers, chills, night sweats, weight loss, swollen lymph nodes, body aches, joint swelling, muscle aches, chest pain, shortness of breath, mood changes, visual or auditory hallucinations.   Objective:    Filed Vitals:   08/18/15 1116  BP: 119/68  Pulse: 59   General: Well Developed, well nourished, and in no acute distress.  Neuro/Psych: Alert and oriented x3, extra-ocular muscles intact, able to move all 4 extremities, sensation grossly intact. Skin: Warm and dry, no rashes noted.  Respiratory: Not using accessory muscles, speaking in full sentences, trachea midline.  Cardiovascular: Pulses palpable, no extremity edema. Abdomen: Does not appear distended. MSK: Right hand is well-appearing. Normal motion and pulses. Capillary refill is intact.  Procedure: Real-time Ultrasound Guided hydrodissection of right median nerve at the carpal tunnel  Device: GE Logiq E  Images permanently stored and available for review in the ultrasound unit. Verbal informed consent obtained. Discussed risks and benefits of procedure. Warned about infection bleeding damage to structures skin hypopigmentation and fat atrophy among  others. Patient expresses understanding and agreement Time-out conducted.  Noted no overlying erythema, induration, or other signs of local infection.  Ultrasound probe placed overlying the carpal tunnel. The median nerve is well visualized with a cross-sectional area of 20 mm. Skin prepped in a sterile fashion.  Local anesthesia: Topical Ethyl chloride.  With sterile technique and under real time ultrasound guidance: 40 mg of Kenalog and 3 mL of lidocaine injected surrounding the median nerve using hydrodissection technique easily.   Completed without difficulty  Pain immediately resolved suggesting accurate placement of the medication.  Advised to call if fevers/chills, erythema, induration, drainage, or persistent bleeding.  Images permanently stored and available for review in the ultrasound unit.  Impression: Technically successful ultrasound guided injection.     No results found for this or any previous visit (from the past 24 hour(s)). No results found.  Impression and Recommendations:   This case required medical decision making of moderate complexity.

## 2015-08-18 NOTE — Patient Instructions (Signed)
Thank you for coming in today. Return in 4 weeks for recheck.  Call or go to the ER if you develop a large red swollen joint with extreme pain or oozing puss.   Use the wrist brace daily for a few days.

## 2015-08-18 NOTE — Assessment & Plan Note (Signed)
Symptoms and ultrasound appearance are consistent with carpal tunnel syndrome. Median nerve cross-sectional area greater than 15 mm is highly correlated with moderate to severe disease on nerve conduction study. Injection successfully placed today. Patient was placed into a wrist brace to reduce risk of tendon rupture. Recheck in a few weeks.

## 2015-09-15 ENCOUNTER — Encounter: Payer: Self-pay | Admitting: Family Medicine

## 2015-09-15 ENCOUNTER — Ambulatory Visit (INDEPENDENT_AMBULATORY_CARE_PROVIDER_SITE_OTHER): Payer: Medicare Other | Admitting: Family Medicine

## 2015-09-15 VITALS — BP 148/64 | HR 62 | Wt 230.0 lb

## 2015-09-15 DIAGNOSIS — G5601 Carpal tunnel syndrome, right upper limb: Secondary | ICD-10-CM

## 2015-09-15 NOTE — Progress Notes (Signed)
Glenn Ortega is a 81 y.o. male who presents to [facility]  today for follow-up right carpal tunnel syndrome injection. Patient was seen on October 31 for right-sided carpal tunnel median nerve hydrodissection. He feels quite well with minimal symptoms. He denies any weakness or pain. He is pleased with the outcome of his injection.   Past Medical History  Diagnosis Date  . GERD (gastroesophageal reflux disease)   . Spinal stenosis     lumbar  . Hypertension     EKG and chest x ray 8/12 [facility]/states had stress test 2 yrs ago- doesnt remember where- not in [facility]   Past Surgical History  Procedure Laterality Date  . Tonsillectomy    . Colonoscopy    . Trigger thumb  1980    right  . Cystoscopy  12/30/2011    Procedure: CYSTOSCOPY;  Surgeon: Reece Packer, MD;  Location: [facility];  Service: Urology;  Laterality: N/A;  . Hernia repair    . Prostate surgery    . Hernia repair Right     age  46  . Lumbar laminectomy/decompression microdiscectomy N/A 11/09/2013    Procedure: Lumbar Laminectomy/Decompression, Microdiscectomy Lumbar Three-Four, Four-Five, with Coflex;  Surgeon: Faythe Ghee, MD;  Location: [facility];  Service: Neurosurgery;  Laterality: N/A;  Lumbar Laminectomy/Decompression, Microdiscectomy Lumbar Three-Four, Four-Five, with Coflex   Social History  Substance Use Topics  . Smoking status: Former Smoker -- 1.00 packs/day for .5 years    Quit date: 12/26/1953  . Smokeless tobacco: Never Used     Comment: per patient he never smoke. 11/25/2014 /rc  . Alcohol Use: 1.2 oz/week    2 Standard drinks or equivalent per week     Comment: socially- occ beer   family history includes Hypertension in an other family member.  ROS as above Medications: Current Outpatient Prescriptions  Medication Sig Dispense Refill  . losartan (COZAAR) 25 MG tablet Take 1 tablet by mouth  daily 90 tablet 0  . omeprazole (PRILOSEC) 20 MG capsule Take 1  capsule (20 mg total) by mouth daily as needed (for acid reflux). 90 capsule 3  . [DISCONTINUED] terazosin (HYTRIN) 1 MG capsule take 1 capsule by mouth once daily 30 capsule 0   No current facility-administered medications for this visit.   Allergies  Allergen Reactions  . Penicillins Swelling     Exam:  BP 148/64 mmHg  Pulse 62  Wt 230 lb (104.327 kg) Gen: Well NAD Right hand normal-appearing nontender no thenar atrophy normal grip strength pulses capillary refill and sensation.  No results found for this or any previous visit (from the past 24 hour(s)). No results found.   Please see individual assessment and plan sections.

## 2015-09-15 NOTE — Patient Instructions (Signed)
Thank you for coming in today. Return as needed.  We will repeat the injection if symptoms return in months or years.  If the symptoms come right back I would recommend surgery.

## 2015-09-15 NOTE — Assessment & Plan Note (Signed)
Much improved status post injection. Return as needed. If symptoms returned quickly would recommend surgery however if symptoms return in months to years would recommend retrial of injection.

## 2015-10-04 ENCOUNTER — Other Ambulatory Visit: Payer: Self-pay | Admitting: Family Medicine

## 2015-10-06 ENCOUNTER — Other Ambulatory Visit: Payer: Self-pay | Admitting: Family Medicine

## 2015-10-27 ENCOUNTER — Ambulatory Visit: Payer: Medicare Other | Admitting: Family Medicine

## 2015-10-28 ENCOUNTER — Encounter: Payer: Self-pay | Admitting: Family Medicine

## 2015-10-28 ENCOUNTER — Ambulatory Visit (INDEPENDENT_AMBULATORY_CARE_PROVIDER_SITE_OTHER): Payer: PPO | Admitting: Family Medicine

## 2015-10-28 ENCOUNTER — Ambulatory Visit (INDEPENDENT_AMBULATORY_CARE_PROVIDER_SITE_OTHER): Payer: PPO

## 2015-10-28 VITALS — BP 141/73 | HR 82 | Temp 97.4°F | Wt 229.0 lb

## 2015-10-28 DIAGNOSIS — R221 Localized swelling, mass and lump, neck: Secondary | ICD-10-CM | POA: Diagnosis not present

## 2015-10-28 DIAGNOSIS — R220 Localized swelling, mass and lump, head: Secondary | ICD-10-CM | POA: Diagnosis not present

## 2015-10-28 DIAGNOSIS — Z23 Encounter for immunization: Secondary | ICD-10-CM

## 2015-10-28 DIAGNOSIS — E041 Nontoxic single thyroid nodule: Secondary | ICD-10-CM | POA: Diagnosis not present

## 2015-10-28 MED ORDER — LEVOFLOXACIN 500 MG PO TABS: 500.0000 mg | ORAL_TABLET | Freq: Every day | ORAL | Status: DC

## 2015-10-28 NOTE — Progress Notes (Signed)
   Subjective:    Patient ID: Glenn Ortega, male    DOB: 07-25-1934, 82 y.o.   MRN: [id number]  HPI Has a lump on his left jaw.  Says it gets bigger adn smaller depending on the day.  Noticed 3 weeks ago when shaving. No trauma. No fever or chills. No ear pain.  He denies any other palpable knots or lymph nodes elsewhere on the body. Visit sore if he touches it while he shaving but otherwise it doesn't really bother him. It is not affecting his chewing or causing any jaw pain.   Review of Systems     Objective:   Physical Exam  Constitutional: He is oriented to person, place, and time. He appears well-developed and well-nourished.  HENT:  Head: Normocephalic and atraumatic.  Right Ear: External ear normal.  Left Ear: External ear normal.  Nose: Nose normal.  Mouth/Throat: Oropharynx is clear and moist.  TMs and canals are clear. On the left side of his jaw just at the base of the earlobe he has a 3 x 5 cm oval-shaped smooth but somewhat mobile lesion. No surrounding erythema or rash over the skin.  Eyes: Conjunctivae and EOM are normal. Pupils are equal, round, and reactive to light.  Neck: Neck supple. No thyromegaly present.  Cardiovascular: Normal rate and normal heart sounds.   Pulmonary/Chest: Effort normal and breath sounds normal.  Lymphadenopathy:    He has no cervical adenopathy.  Neurological: He is alert and oriented to person, place, and time.  Skin: Skin is warm and dry.  Psychiatric: He has a normal mood and affect.      Assessment & Plan:  Lump/mass of left neck-will refer with ultrasound to see if it seems like it consistent with a swollen lymph node or possible cyst or abscess. He has not had any fevers chills or upper respiratory infections recently. He is a former smoker. Previously smoked 1 pack per day but not currently. Will contact with results once available.  Sound confirmed possible infected lymph node versus a mass. I did go ahead and place him on  antibiotics for 10 days and will move forward with CT scan for further evaluation. Will need an updated BUN/creatinine ordered.

## 2015-10-29 ENCOUNTER — Other Ambulatory Visit: Payer: Self-pay | Admitting: Family Medicine

## 2015-10-29 ENCOUNTER — Telehealth: Payer: Self-pay

## 2015-10-29 ENCOUNTER — Encounter: Payer: Self-pay | Admitting: Family Medicine

## 2015-10-29 DIAGNOSIS — R9389 Abnormal findings on diagnostic imaging of other specified body structures: Secondary | ICD-10-CM

## 2015-10-29 DIAGNOSIS — R938 Abnormal findings on diagnostic imaging of other specified body structures: Secondary | ICD-10-CM | POA: Diagnosis not present

## 2015-10-29 LAB — CBC WITH DIFFERENTIAL/PLATELET
BASOS ABS: 0 10*3/uL (ref 0.0–0.1)
Basophils Relative: 0 % (ref 0–1)
Eosinophils Absolute: 0.2 10*3/uL (ref 0.0–0.7)
Eosinophils Relative: 2 % (ref 0–5)
HCT: 43.2 % (ref 39.0–52.0)
HEMOGLOBIN: 15.1 g/dL (ref 13.0–17.0)
LYMPHS ABS: 2 10*3/uL (ref 0.7–4.0)
Lymphocytes Relative: 21 % (ref 12–46)
MCH: 29.5 pg (ref 26.0–34.0)
MCHC: 35 g/dL (ref 30.0–36.0)
MCV: 84.5 fL (ref 78.0–100.0)
MONOS PCT: 8 % (ref 3–12)
MPV: 9.5 fL (ref 8.6–12.4)
Monocytes Absolute: 0.8 10*3/uL (ref 0.1–1.0)
NEUTROS ABS: 6.7 10*3/uL (ref 1.7–7.7)
Neutrophils Relative %: 69 % (ref 43–77)
Platelets: 239 10*3/uL (ref 150–400)
RBC: 5.11 MIL/uL (ref 4.22–5.81)
RDW: 15 % (ref 11.5–15.5)
WBC: 9.7 10*3/uL (ref 4.0–10.5)

## 2015-10-29 LAB — BASIC METABOLIC PANEL WITH GFR
BUN: 23 mg/dL (ref 7–25)
CALCIUM: 9.2 mg/dL (ref 8.6–10.3)
CO2: 27 mmol/L (ref 20–31)
Chloride: 102 mmol/L (ref 98–110)
Creat: 1.12 mg/dL (ref 0.70–1.18)
GFR, EST NON AFRICAN AMERICAN: 62 mL/min (ref >=60)
GFR, Est African American: 72 mL/min (ref >=60)
GLUCOSE: 118 mg/dL — AB (ref 65–99)
Potassium: 4.3 mmol/L (ref 3.5–5.3)
Sodium: 139 mmol/L (ref 135–146)

## 2015-10-29 NOTE — Progress Notes (Signed)
Quick Note:  All labs are normal. ______ 

## 2015-11-03 ENCOUNTER — Other Ambulatory Visit: Payer: Self-pay | Admitting: Family Medicine

## 2015-11-03 ENCOUNTER — Ambulatory Visit (HOSPITAL_BASED_OUTPATIENT_CLINIC_OR_DEPARTMENT_OTHER)
Admission: RE | Admit: 2015-11-03 | Discharge: 2015-11-03 | Disposition: A | Discharge status: Home / Self Care | Payer: PPO | Source: Ambulatory Visit | Attending: Family Medicine | Admitting: Family Medicine

## 2015-11-03 ENCOUNTER — Encounter (HOSPITAL_BASED_OUTPATIENT_CLINIC_OR_DEPARTMENT_OTHER): Payer: Self-pay

## 2015-11-03 DIAGNOSIS — K118 Other diseases of salivary glands: Secondary | ICD-10-CM

## 2015-11-03 DIAGNOSIS — R221 Localized swelling, mass and lump, neck: Secondary | ICD-10-CM | POA: Diagnosis not present

## 2015-11-03 DIAGNOSIS — R220 Localized swelling, mass and lump, head: Secondary | ICD-10-CM | POA: Insufficient documentation

## 2015-11-03 DIAGNOSIS — Z87891 Personal history of nicotine dependence: Secondary | ICD-10-CM | POA: Diagnosis not present

## 2015-11-03 DIAGNOSIS — R9389 Abnormal findings on diagnostic imaging of other specified body structures: Secondary | ICD-10-CM

## 2015-11-03 MED ORDER — IOHEXOL 300 MG/ML  SOLN
75.0000 mL | Freq: Once | INTRAMUSCULAR | Status: AC | PRN
  Administered 2015-11-03: 75 mL via INTRAVENOUS

## 2015-11-07 ENCOUNTER — Other Ambulatory Visit: Payer: Self-pay | Admitting: Radiology

## 2015-11-07 DIAGNOSIS — R220 Localized swelling, mass and lump, head: Secondary | ICD-10-CM | POA: Diagnosis not present

## 2015-11-07 DIAGNOSIS — R1310 Dysphagia, unspecified: Secondary | ICD-10-CM | POA: Diagnosis not present

## 2015-11-10 ENCOUNTER — Ambulatory Visit (HOSPITAL_COMMUNITY): Payer: PPO

## 2015-11-13 ENCOUNTER — Encounter (HOSPITAL_BASED_OUTPATIENT_CLINIC_OR_DEPARTMENT_OTHER): Payer: Self-pay | Admitting: *Deleted

## 2015-11-13 ENCOUNTER — Encounter (HOSPITAL_BASED_OUTPATIENT_CLINIC_OR_DEPARTMENT_OTHER)
Admission: RE | Admit: 2015-11-13 | Discharge: 2015-11-13 | Disposition: A | Discharge status: Home / Self Care | Payer: PPO | Source: Ambulatory Visit | Attending: Otolaryngology | Admitting: Otolaryngology

## 2015-11-13 DIAGNOSIS — R220 Localized swelling, mass and lump, head: Secondary | ICD-10-CM | POA: Diagnosis not present

## 2015-11-13 DIAGNOSIS — D110 Benign neoplasm of parotid gland: Secondary | ICD-10-CM | POA: Diagnosis not present

## 2015-11-13 DIAGNOSIS — E669 Obesity, unspecified: Secondary | ICD-10-CM | POA: Diagnosis not present

## 2015-11-13 DIAGNOSIS — Z87891 Personal history of nicotine dependence: Secondary | ICD-10-CM | POA: Diagnosis not present

## 2015-11-13 DIAGNOSIS — Z6834 Body mass index (BMI) 34.0-34.9, adult: Secondary | ICD-10-CM | POA: Diagnosis not present

## 2015-11-13 DIAGNOSIS — I10 Essential (primary) hypertension: Secondary | ICD-10-CM | POA: Diagnosis not present

## 2015-11-13 DIAGNOSIS — K219 Gastro-esophageal reflux disease without esophagitis: Secondary | ICD-10-CM | POA: Diagnosis not present

## 2015-11-14 ENCOUNTER — Ambulatory Visit: Payer: Self-pay | Admitting: Otolaryngology

## 2015-11-14 NOTE — H&P (Signed)
Assessment  Parotid mass (784.2) (R22.0). Dysphagia (787.20) (R13.10). Discussed  This is a 82 year old male with left parotid mass. Personally reviewed his recent CT scan of the neck. We discussed the options of fine needle aspiration versus going ahead with parotidectomy. Patient would like the gland removed regardless and thus the needle aspiration is not necessary. We agreed to cancel this appointment. Risks and benefits of parotidectomy along with post-op recovery discussed. All questions and concerns addressed. Reassured him that endoscopic exam of the larynx is normal. We will schedule him accordingly for surgery. Patient agrees with the plan. Plan  I have interviewed and examined the patient and developed the proposed treatment plan.  Glenn Ortega H. Glenn Ortega, M.D. Reason For Visit  Glenn Ortega is here today at the kind request of Glenn Ortega for consultation and opinion for thickening in throat. HPI  Patient is a 82 year old male who presents for dysphagia and parotid mass. He states that about once per month, he becomes choked on food, typically bulky food like steak. He has not experienced aspiration pneumonia. No previous barium swallow study. He denies any chronic cough, hoarseness, odynophagia, mucous feeling in the throat, globus sensation, laryngospasm or ear pain. He has taken Omeprazole off and on for the past two years. Does not take it regularly.  Three weeks ago he noticed a lump on the left side of his jaw near the ear. It did not fluctuate with meals but has progressively gotten larger. Patient had a CT of neck with contrast on 11/03/15 which showed a homogeneously enhancing 2.1cm left parotid mass at the posterior inferior margin of the superficial lobe. No cervical lymphadenopathy. Non-specific foci of asymmetric mucosal thickening in the right oropharynx and the left pyriform sinus (per report).  Patient is scheduled to have an FNA of the left parotid mass early next week  radiology.  No PMH of heart  or lung disease. No diabetes or immunocompromised state.  Never a smoker. Drinks about 3-4 cups of coffee and soda daily. Allergies  Penicillins. Current Meds  Cozaar TABS (Losartan Potassium);; RPT Omeprazole CPDR;; RPT. Active Problems  Acid reflux   (530.81) (K21.9). PSH  Back Surgery Hernia Repair Oral Surgery Tooth Extraction Prostate Surgery Tonsillectomy. Family Hx  Family history of migraine headaches: Father (V17.2) (Z82.0) Family history of myasthenia gravis: Father (V17.89) (Z82.0). Personal Hx  Caffeine use (V49.89) (F15.90); 1 cup daily Never smoker Non-smoker (V49.89) (Z78.9) Social alcohol use (Z78.9). ROS  12 system ROS was obtained and reviewed on the Health Maintenance form dated today.  Positive responses are shown above.  If the symptom is not checked, the patient has denied it. Vital Signs   Recorded by Glenn Ortega on 07 Nov 2015 01:06 PM BP:120/70,  BSA Calculated: 2.16 ,  BMI Calculated: 34.82 ,  Weight: 229 lb , BMI: 34.8 kg/m2,  Height: 5 ft 8 in. Physical Exam  APPEARANCE: Well developed, obese, in no acute distress.  Normal affect, in a pleasant mood.  Oriented to time, place and person. COMMUNICATION: Normal voice   HEAD & FACE:  No scars, lesions or masses of head and face.  Sinuses nontender to palpation.  Left parotid gland is has a 2x3cm round, rubbery mass. Facial strength symmetric.  EYES: EOMI with normal primary gaze alignment. Visual acuity grossly intact.  PERRLA EXTERNAL EAR & NOSE: No scars, lesions or masses  EAC & TYMPANIC MEMBRANE:  EAC shows excessive cerumen in both ear canals, unable to fully visualize TMs. GROSS HEARING:  Normal   TMJ:  Nontender  INTRANASAL EXAM: No polyps or purulence. Rightward septal deviation. NASOPHARYNX: Normal, without lesions. LIPS, TEETH & GUMS: No lip lesions and normal gums. Dentures upper. Lower teeth in poor repair. ORAL CAVITY/OROPHARYNX:  Oral mucosa moist  without lesion or asymmetry of the palate, tongue or posterior pharynx. Tonsils absent. NECK:  Supple without adenopathy or mass. THYROID:  Normal with no masses palpable.  NEUROLOGIC:  No gross CN deficits. No nystagmus noted.   LYMPHATIC:  No enlarged nodes palpable. Procedure  Fiberoptic Laryngoscopy Name: Glenn Ortega     Age: 47 year     The risks and benefits of this procedure have been thoroughly discussed with the patient/parent.  The most commons risks outlined included but were not limited to: injury  to the nasal mucosa or throat irritation.  The patient/parent was further informed that there are other less common risks.  The patient/parent was given the opportunity to ask questions and all such questions were answered to the patient/parent's satisfaction.  Patient/parent acknowledged the risks and has agreed to proceed.   Performing Provider: Jolene Ortega. Dr. Constance Ortega present for exam.  The risks of the procedure are minimal and were discussed with the patient today. Pre-op Diagnosis: dysphagia  Post-op Diagnosis: same Allergy:  reviewed allergies as listed Nasal Prep: Lidocaine/Afrin   Procedure: With the patient seated in the exam chair, the L nasal cavity was intubated with the flexible laryngoscope.  The nasal cavity mucosa, nasopharynx, hypopharynx and larynx were all examined with findings as noted below.  The scope was then removed.  The patient tolerated the procedure well without complication or blood loss (unless indicated in findings).   FINDINGS: Nasal mucosa: normal Nasopharynx: normal Hypopharynx: normal. Lingual tonsils normal and symmetric in size. Larynx: vocal cords with good mobility; no mass, nodule or polyp. Pyriform sinuses normal. When secretions cleared, no abnormal finding without pyriform sinuses.  Research scientist (medical)  Electronically signed by : Glenn Provost  PA-C; 11/07/2015 3:04 PM EST. Electronically signed by : Glenn Ortega  M.D.; 11/07/2015 3:35  PM EST.

## 2015-11-17 ENCOUNTER — Ambulatory Visit (HOSPITAL_BASED_OUTPATIENT_CLINIC_OR_DEPARTMENT_OTHER): Payer: PPO | Admitting: Anesthesiology

## 2015-11-17 ENCOUNTER — Ambulatory Visit (HOSPITAL_BASED_OUTPATIENT_CLINIC_OR_DEPARTMENT_OTHER)
Admission: RE | Admit: 2015-11-17 | Discharge: 2015-11-18 | Disposition: A | Discharge status: Home / Self Care | Payer: PPO | Source: Ambulatory Visit | Attending: Otolaryngology | Admitting: Otolaryngology

## 2015-11-17 ENCOUNTER — Encounter (HOSPITAL_BASED_OUTPATIENT_CLINIC_OR_DEPARTMENT_OTHER)
Admission: RE | Disposition: A | Discharge status: Home / Self Care | Payer: Self-pay | Source: Ambulatory Visit | Attending: Otolaryngology

## 2015-11-17 ENCOUNTER — Encounter (HOSPITAL_BASED_OUTPATIENT_CLINIC_OR_DEPARTMENT_OTHER): Payer: Self-pay | Admitting: Anesthesiology

## 2015-11-17 DIAGNOSIS — K219 Gastro-esophageal reflux disease without esophagitis: Secondary | ICD-10-CM | POA: Diagnosis not present

## 2015-11-17 DIAGNOSIS — K118 Other diseases of salivary glands: Secondary | ICD-10-CM | POA: Diagnosis not present

## 2015-11-17 DIAGNOSIS — Z6834 Body mass index (BMI) 34.0-34.9, adult: Secondary | ICD-10-CM | POA: Insufficient documentation

## 2015-11-17 DIAGNOSIS — E669 Obesity, unspecified: Secondary | ICD-10-CM | POA: Insufficient documentation

## 2015-11-17 DIAGNOSIS — D110 Benign neoplasm of parotid gland: Secondary | ICD-10-CM | POA: Diagnosis not present

## 2015-11-17 DIAGNOSIS — R220 Localized swelling, mass and lump, head: Secondary | ICD-10-CM | POA: Diagnosis not present

## 2015-11-17 DIAGNOSIS — I10 Essential (primary) hypertension: Secondary | ICD-10-CM | POA: Insufficient documentation

## 2015-11-17 DIAGNOSIS — D119 Benign neoplasm of major salivary gland, unspecified: Secondary | ICD-10-CM | POA: Diagnosis not present

## 2015-11-17 DIAGNOSIS — Z87891 Personal history of nicotine dependence: Secondary | ICD-10-CM | POA: Insufficient documentation

## 2015-11-17 HISTORY — PX: PAROTIDECTOMY: SHX2163

## 2015-11-17 SURGERY — EXCISION, PAROTID GLAND
Anesthesia: General | Site: Face | Laterality: Left

## 2015-11-17 MED ORDER — OXYCODONE HCL 5 MG/5ML PO SOLN: 5.0000 mg | Freq: Once | ORAL | Status: DC | PRN

## 2015-11-17 MED ORDER — HYDROCODONE-ACETAMINOPHEN 7.5-325 MG PO TABS: 1.0000 | ORAL_TABLET | Freq: Four times a day (QID) | ORAL | Status: DC | PRN

## 2015-11-17 MED ORDER — PROMETHAZINE HCL 25 MG RE SUPP: 25.0000 mg | Freq: Four times a day (QID) | RECTAL | Status: DC | PRN

## 2015-11-17 MED ORDER — SUCCINYLCHOLINE CHLORIDE 20 MG/ML IJ SOLN
INTRAMUSCULAR | Status: DC | PRN
  Administered 2015-11-17: 100 mg via INTRAVENOUS

## 2015-11-17 MED ORDER — SODIUM CHLORIDE 0.9 % IV SOLN: INTRAVENOUS | Status: DC

## 2015-11-17 MED ORDER — DEXTROSE-NACL 5-0.9 % IV SOLN
INTRAVENOUS | Status: DC
  Administered 2015-11-17: 12:00:00 via INTRAVENOUS

## 2015-11-17 MED ORDER — DEXAMETHASONE SODIUM PHOSPHATE 10 MG/ML IJ SOLN
INTRAMUSCULAR | Status: AC
  Filled 2015-11-17: qty 1

## 2015-11-17 MED ORDER — CLINDAMYCIN PHOSPHATE 900 MG/50ML IV SOLN
INTRAVENOUS | Status: AC
  Filled 2015-11-17: qty 50

## 2015-11-17 MED ORDER — ONDANSETRON HCL 4 MG/2ML IJ SOLN
INTRAMUSCULAR | Status: DC | PRN
  Administered 2015-11-17: 4 mg via INTRAVENOUS

## 2015-11-17 MED ORDER — FENTANYL CITRATE (PF) 100 MCG/2ML IJ SOLN
50.0000 ug | INTRAMUSCULAR | Status: AC | PRN
  Administered 2015-11-17: 25 ug via INTRAVENOUS
  Administered 2015-11-17: 50 ug via INTRAVENOUS
  Administered 2015-11-17: 25 ug via INTRAVENOUS
  Administered 2015-11-17: 100 ug via INTRAVENOUS

## 2015-11-17 MED ORDER — LIDOCAINE HCL (CARDIAC) 20 MG/ML IV SOLN
INTRAVENOUS | Status: DC | PRN
  Administered 2015-11-17: 50 mg via INTRAVENOUS

## 2015-11-17 MED ORDER — HYDROMORPHONE HCL 1 MG/ML IJ SOLN: 0.2500 mg | INTRAMUSCULAR | Status: DC | PRN

## 2015-11-17 MED ORDER — LIDOCAINE HCL (CARDIAC) 20 MG/ML IV SOLN: INTRAVENOUS | Status: DC | PRN

## 2015-11-17 MED ORDER — EPHEDRINE SULFATE 50 MG/ML IJ SOLN
INTRAMUSCULAR | Status: DC | PRN
  Administered 2015-11-17: 10 mg via INTRAVENOUS
  Administered 2015-11-17: 5 mg via INTRAVENOUS
  Administered 2015-11-17: 10 mg via INTRAVENOUS
  Administered 2015-11-17: 5 mg via INTRAVENOUS
  Administered 2015-11-17: 10 mg via INTRAVENOUS

## 2015-11-17 MED ORDER — PANTOPRAZOLE SODIUM 40 MG PO TBEC: 40.0000 mg | DELAYED_RELEASE_TABLET | Freq: Every day | ORAL | Status: DC

## 2015-11-17 MED ORDER — LIDOCAINE HCL 4 % EX SOLN
CUTANEOUS | Status: DC | PRN
  Administered 2015-11-17: 3 mL via TOPICAL

## 2015-11-17 MED ORDER — FENTANYL CITRATE (PF) 100 MCG/2ML IJ SOLN
INTRAMUSCULAR | Status: AC
  Filled 2015-11-17: qty 2

## 2015-11-17 MED ORDER — PROPOFOL 10 MG/ML IV BOLUS
INTRAVENOUS | Status: AC
  Filled 2015-11-17: qty 20

## 2015-11-17 MED ORDER — CLINDAMYCIN PHOSPHATE 900 MG/50ML IV SOLN
900.0000 mg | INTRAVENOUS | Status: AC
  Administered 2015-11-17: 600 mg via INTRAVENOUS

## 2015-11-17 MED ORDER — HYDROCODONE-ACETAMINOPHEN 5-325 MG PO TABS
1.0000 | ORAL_TABLET | ORAL | Status: DC | PRN
  Administered 2015-11-17: 1 via ORAL
  Filled 2015-11-17: qty 1

## 2015-11-17 MED ORDER — MIDAZOLAM HCL 2 MG/2ML IJ SOLN: 1.0000 mg | INTRAMUSCULAR | Status: DC | PRN

## 2015-11-17 MED ORDER — PROMETHAZINE HCL 25 MG PO TABS: 25.0000 mg | ORAL_TABLET | Freq: Four times a day (QID) | ORAL | Status: DC | PRN

## 2015-11-17 MED ORDER — PROPOFOL 10 MG/ML IV BOLUS
INTRAVENOUS | Status: DC | PRN
  Administered 2015-11-17: 200 mg via INTRAVENOUS
  Administered 2015-11-17: 30 mg via INTRAVENOUS

## 2015-11-17 MED ORDER — LOSARTAN POTASSIUM 25 MG PO TABS: 25.0000 mg | ORAL_TABLET | Freq: Every day | ORAL | Status: DC

## 2015-11-17 MED ORDER — SCOPOLAMINE 1 MG/3DAYS TD PT72: 1.0000 | MEDICATED_PATCH | Freq: Once | TRANSDERMAL | Status: DC | PRN

## 2015-11-17 MED ORDER — DEXAMETHASONE SODIUM PHOSPHATE 4 MG/ML IJ SOLN
INTRAMUSCULAR | Status: DC | PRN
  Administered 2015-11-17: 10 mg via INTRAVENOUS

## 2015-11-17 MED ORDER — BACITRACIN ZINC 500 UNIT/GM EX OINT
TOPICAL_OINTMENT | CUTANEOUS | Status: AC
  Filled 2015-11-17: qty 28.35

## 2015-11-17 MED ORDER — LACTATED RINGERS IV SOLN
INTRAVENOUS | Status: DC
  Administered 2015-11-17: 10 mL/h via INTRAVENOUS
  Administered 2015-11-17: 10:00:00 via INTRAVENOUS

## 2015-11-17 MED ORDER — 0.9 % SODIUM CHLORIDE (POUR BTL) OPTIME
TOPICAL | Status: DC | PRN
  Administered 2015-11-17: 120 mL

## 2015-11-17 MED ORDER — OXYCODONE HCL 5 MG PO TABS: 5.0000 mg | ORAL_TABLET | Freq: Once | ORAL | Status: DC | PRN

## 2015-11-17 MED ORDER — IBUPROFEN 100 MG/5ML PO SUSP: 400.0000 mg | Freq: Four times a day (QID) | ORAL | Status: DC | PRN

## 2015-11-17 MED ORDER — ONDANSETRON HCL 4 MG/2ML IJ SOLN: 4.0000 mg | Freq: Four times a day (QID) | INTRAMUSCULAR | Status: DC | PRN

## 2015-11-17 MED ORDER — GLYCOPYRROLATE 0.2 MG/ML IJ SOLN
0.2000 mg | Freq: Once | INTRAMUSCULAR | Status: AC | PRN
  Administered 2015-11-17: 0.2 mg via INTRAVENOUS

## 2015-11-17 SURGICAL SUPPLY — 67 items
APPLICATOR COTTON TIP 6IN STRL (MISCELLANEOUS) ×2 IMPLANT
ATTRACTOMAT 16X20 MAGNETIC DRP (DRAPES) ×2 IMPLANT
BENZOIN TINCTURE PRP APPL 2/3 (GAUZE/BANDAGES/DRESSINGS) IMPLANT
BLADE SURG 15 STRL LF DISP TIS (BLADE) ×1 IMPLANT
BLADE SURG 15 STRL SS (BLADE) ×1
CANISTER SUCT 1200ML W/VALVE (MISCELLANEOUS) ×2 IMPLANT
CLEANER CAUTERY TIP 5X5 PAD (MISCELLANEOUS) ×1 IMPLANT
CORDS BIPOLAR (ELECTRODE) ×2 IMPLANT
COVER BACK TABLE 60X90IN (DRAPES) ×2 IMPLANT
COVER MAYO STAND STRL (DRAPES) ×2 IMPLANT
DERMABOND ADVANCED (GAUZE/BANDAGES/DRESSINGS) ×1
DERMABOND ADVANCED .7 DNX12 (GAUZE/BANDAGES/DRESSINGS) ×1 IMPLANT
DRAIN JACKSON RD 7FR 3/32 (WOUND CARE) ×2 IMPLANT
DRAIN TLS ROUND 10FR (DRAIN) IMPLANT
DRAPE INCISE 23X17 IOBAN STRL (DRAPES) ×1
DRAPE INCISE IOBAN 23X17 STRL (DRAPES) ×1 IMPLANT
DRAPE U-SHAPE 76X120 STRL (DRAPES) ×2 IMPLANT
ELECT COATED BLADE 2.86 ST (ELECTRODE) ×2 IMPLANT
ELECT REM PT RETURN 9FT ADLT (ELECTROSURGICAL) ×2
ELECTRODE REM PT RTRN 9FT ADLT (ELECTROSURGICAL) ×1 IMPLANT
EVACUATOR SILICONE 100CC (DRAIN) ×2 IMPLANT
FORCEPS BIPOLAR SPETZLER 8 1.0 (NEUROSURGERY SUPPLIES) ×2 IMPLANT
GAUZE SPONGE 4X4 16PLY XRAY LF (GAUZE/BANDAGES/DRESSINGS) ×2 IMPLANT
GLOVE BIO SURGEON STRL SZ 6.5 (GLOVE) ×2 IMPLANT
GLOVE BIO SURGEON STRL SZ7 (GLOVE) IMPLANT
GLOVE BIO SURGEON STRL SZ7.5 (GLOVE) IMPLANT
GLOVE BIOGEL M STRL SZ7.5 (GLOVE) ×2 IMPLANT
GLOVE BIOGEL PI IND STRL 7.0 (GLOVE) ×1 IMPLANT
GLOVE BIOGEL PI IND STRL 8 (GLOVE) ×1 IMPLANT
GLOVE BIOGEL PI INDICATOR 7.0 (GLOVE) ×1
GLOVE BIOGEL PI INDICATOR 8 (GLOVE) ×1
GLOVE ECLIPSE 7.5 STRL STRAW (GLOVE) ×2 IMPLANT
GLOVE ECLIPSE 8.0 STRL XLNG CF (GLOVE) IMPLANT
GLOVE SS BIOGEL STRL SZ 7.5 (GLOVE) IMPLANT
GLOVE SUPERSENSE BIOGEL SZ 7.5 (GLOVE)
GOWN STRL REUS W/ TWL LRG LVL3 (GOWN DISPOSABLE) ×1 IMPLANT
GOWN STRL REUS W/ TWL XL LVL3 (GOWN DISPOSABLE) ×2 IMPLANT
GOWN STRL REUS W/TWL LRG LVL3 (GOWN DISPOSABLE) ×1
GOWN STRL REUS W/TWL XL LVL3 (GOWN DISPOSABLE) ×2
LOCATOR NERVE 3 VOLT (DISPOSABLE) IMPLANT
NEEDLE HYPO 25X1 1.5 SAFETY (NEEDLE) IMPLANT
NEEDLE PRECISIONGLIDE 27X1.5 (NEEDLE) IMPLANT
NS IRRIG 1000ML POUR BTL (IV SOLUTION) ×2 IMPLANT
PACK BASIN DAY SURGERY FS (CUSTOM PROCEDURE TRAY) ×2 IMPLANT
PAD CLEANER CAUTERY TIP 5X5 (MISCELLANEOUS) ×1
PENCIL FOOT CONTROL (ELECTRODE) ×2 IMPLANT
PIN SAFETY STERILE (MISCELLANEOUS) ×2 IMPLANT
SHEARS HARMONIC 9CM CVD (BLADE) ×2 IMPLANT
SHEET MEDIUM DRAPE 40X70 STRL (DRAPES) ×2 IMPLANT
SLEEVE SCD COMPRESS KNEE MED (MISCELLANEOUS) ×2 IMPLANT
SPONGE GAUZE 4X4 12PLY STER LF (GAUZE/BANDAGES/DRESSINGS) IMPLANT
STAPLER VISISTAT 35W (STAPLE) IMPLANT
STRIP CLOSURE SKIN 1/2X4 (GAUZE/BANDAGES/DRESSINGS) IMPLANT
SUCTION FRAZIER HANDLE 10FR (MISCELLANEOUS)
SUCTION TUBE FRAZIER 10FR DISP (MISCELLANEOUS) IMPLANT
SUT CHROMIC 3 0 PS 2 (SUTURE) ×2 IMPLANT
SUT ETHILON 3 0 PS 1 (SUTURE) ×2 IMPLANT
SUT ETHILON 5 0 P 3 18 (SUTURE)
SUT NYLON ETHILON 5-0 P-3 1X18 (SUTURE) IMPLANT
SUT PLAIN 5 0 P 3 18 (SUTURE) IMPLANT
SUT SILK 3 0 PS 1 (SUTURE) ×2 IMPLANT
SUT SILK 3 0 SH CR/8 (SUTURE) ×2 IMPLANT
SUT SILK 4 0 TIES 17X18 (SUTURE) ×2 IMPLANT
SYR BULB 3OZ (MISCELLANEOUS) ×2 IMPLANT
SYR CONTROL 10ML LL (SYRINGE) IMPLANT
TOWEL OR 17X24 6PK STRL BLUE (TOWEL DISPOSABLE) ×4 IMPLANT
TUBE CONNECTING 20X1/4 (TUBING) ×2 IMPLANT

## 2015-11-17 NOTE — Op Note (Signed)
OPERATIVE REPORT  DATE OF SURGERY: 11/17/2015  PATIENT:  Glenn Ortega,  82 y.o. male  PRE-OPERATIVE DIAGNOSIS:  LEFT PAROTID MASS  POST-OPERATIVE DIAGNOSIS:  LEFT PAROTID MASS  PROCEDURE:  Procedure(s): LEFT PAROTIDECTOMY  SURGEON:  Beckie Salts, MD  ASSISTANTS: Jolene Provost PA  ANESTHESIA:   General   EBL:  30 ml  DRAINS: 7 French round JP  LOCAL MEDICATIONS USED:  None  SPECIMEN:  Left  Parotid with deep lobe tumor  COUNTS:  Correct  PROCEDURE DETAILS: The patient was taken to the operating room and placed on the operating table in the supine position. Following induction of general endotracheal anesthesia, the left side of the face was prepped and draped in a standard fashion. A preauricular incision was outlined with a marking pen with extension down around the mastoid process and 2 fingerbreadths below the angle of the mandible.  Electrocautery was used to incise the skin and subcutaneous tissue. The lateral branch of the greater auricular nerve was not preserved. The parotid gland was dissected forward off of the upper sternocleidomastoid muscle and the digastric muscle was exposed posteriorly. The gland was also dissected off of the external auditory canal. Careful dissection superior to the digastric muscle and medial to the tympanomastoid suture line revealed the main trunk of the facial nerve. Using a McCabe dissector the main trunk was dissected out towards the pes anserinus. The upper and lower divisions were then dissected identifying all branches.  The harmonic scalpel was used to divide the parotid tissue. Bipolar cautery was used as needed for completion of hemostasis. The tumor was identified in the deep lobe part of the gland, adjacent to the distal section of the pes anserinus. The glandular tissue with the accompanying tumor was dissected free and removed, sent for pathologic evaluation. One small branch of the facial nerve that may have been a  communicating branch went directly towards the tumor and was not preservable. The wound was irrigated with saline and hemostasis was completed as needed.  The drain was exited through a separate stab incision and secured in place with nylon suture. A running subcuticular chromic closure was accomplished and Dermabond was used on the skin. The drain was charged. She was awakened, extubated and transferred to recovery in stable condition.    PATIENT DISPOSITION:  To PACU, stable

## 2015-11-17 NOTE — Anesthesia Postprocedure Evaluation (Signed)
Anesthesia Post Note  Patient: Glenn Ortega  Procedure(s) Performed: Procedure(s) (LRB): LEFT PAROTIDECTOMY (Left)  Patient location during evaluation: PACU Anesthesia Type: General Level of consciousness: awake and alert and patient cooperative Pain management: pain level controlled Vital Signs Assessment: post-procedure vital signs reviewed and stable Respiratory status: spontaneous breathing and respiratory function stable Cardiovascular status: stable Anesthetic complications: no    Last Vitals:  Filed Vitals:   11/17/15 1045 11/17/15 1100  BP: 161/82 156/79  Pulse: 78 71  Temp:    Resp: 19 19    Last Pain:  Filed Vitals:   11/17/15 1111  PainSc: [name]

## 2015-11-17 NOTE — Discharge Instructions (Signed)
You may shower and use soap and water. Do not use any creams, oils or ointment. ° °

## 2015-11-17 NOTE — Progress Notes (Signed)
Patient ID: Glenn Ortega, male   DOB: 1934-07-08, 82 y.o.   MRN: [id number]  Doing well, no complaints.  Awake and alert. Facial nerve fully functioning except mild weakness of the lower lip. Incision intact and dry, JP functioning.  Stable post op.   Over night obs.

## 2015-11-17 NOTE — Anesthesia Procedure Notes (Signed)
Procedure Name: Intubation Date/Time: 11/17/2015 8:45 AM Performed by: Maryella Shivers Pre-anesthesia Checklist: Patient identified, Emergency Drugs available, Suction available and Patient being monitored Patient Re-evaluated:Patient Re-evaluated prior to inductionOxygen Delivery Method: Circle System Utilized Preoxygenation: Pre-oxygenation with 100% oxygen Intubation Type: IV induction Ventilation: Mask ventilation without difficulty Laryngoscope Size: Mac and 3 Grade View: Grade I Tube type: Oral Tube size: 8.0 mm Number of attempts: 1 Airway Equipment and Method: Stylet,  Oral airway and LTA kit utilized Placement Confirmation: ETT inserted through vocal cords under direct vision,  positive ETCO2 and breath sounds checked- equal and bilateral Secured at: 21 cm Tube secured with: Tape Dental Injury: Teeth and Oropharynx as per pre-operative assessment

## 2015-11-17 NOTE — Transfer of Care (Signed)
Immediate Anesthesia Transfer of Care Note  Patient: Glenn Ortega  Procedure(s) Performed: Procedure(s): LEFT PAROTIDECTOMY (Left)  Patient Location: PACU  Anesthesia Type:General  Level of Consciousness: sedated  Airway & Oxygen Therapy: Patient Spontanous Breathing and Patient connected to face mask oxygen  Post-op Assessment: Report given to RN and Post -op Vital signs reviewed and stable  Post vital signs: Reviewed and stable  Last Vitals:  Filed Vitals:   11/17/15 0725  BP: 156/86  Pulse: 66  Temp: 36.4 C  Resp: 18    Complications: No apparent anesthesia complications

## 2015-11-17 NOTE — H&P (View-Only) (Signed)
Assessment  Parotid mass (784.2) (R22.0). Dysphagia (787.20) (R13.10). Discussed  This is a 82 year old male with left parotid mass. Personally reviewed his recent CT scan of the neck. We discussed the options of fine needle aspiration versus going ahead with parotidectomy. Patient would like the gland removed regardless and thus the needle aspiration is not necessary. We agreed to cancel this appointment. Risks and benefits of parotidectomy along with post-op recovery discussed. All questions and concerns addressed. Reassured him that endoscopic exam of the larynx is normal. We will schedule him accordingly for surgery. Patient agrees with the plan. Plan  I have interviewed and examined the patient and developed the proposed treatment plan.  Hema Lanza H. [name], M.D. Reason For Visit  Glenn Ortega is here today at the kind request of [name] for consultation and opinion for thickening in throat. HPI  Patient is a 82 year old male who presents for dysphagia and parotid mass. He states that about once per month, he becomes choked on food, typically bulky food like steak. He has not experienced aspiration pneumonia. No previous barium swallow study. He denies any chronic cough, hoarseness, odynophagia, mucous feeling in the throat, globus sensation, laryngospasm or ear pain. He has taken Omeprazole off and on for the past two years. Does not take it regularly.  Three weeks ago he noticed a lump on the left side of his jaw near the ear. It did not fluctuate with meals but has progressively gotten larger. Patient had a CT of neck with contrast on 11/03/15 which showed a homogeneously enhancing 2.1cm left parotid mass at the posterior inferior margin of the superficial lobe. No cervical lymphadenopathy. Non-specific foci of asymmetric mucosal thickening in the right oropharynx and the left pyriform sinus (per report).  Patient is scheduled to have an FNA of the left parotid mass early next week  radiology.  No PMH of heart  or lung disease. No diabetes or immunocompromised state.  Never a smoker. Drinks about 3-4 cups of coffee and soda daily. Allergies  Penicillins. Current Meds  Cozaar TABS (Losartan Potassium);; RPT Omeprazole CPDR;; RPT. Active Problems  Acid reflux   (530.81) (K21.9). PSH  Back Surgery Hernia Repair Oral Surgery Tooth Extraction Prostate Surgery Tonsillectomy. Family Hx  Family history of migraine headaches: Father (V17.2) (Z82.0) Family history of myasthenia gravis: Father (V17.89) (Z82.0). Personal Hx  Caffeine use (V49.89) (F15.90); 1 cup daily Never smoker Non-smoker (V49.89) (Z78.9) Social alcohol use (Z78.9). ROS  12 system ROS was obtained and reviewed on the Health Maintenance form dated today.  Positive responses are shown above.  If the symptom is not checked, the patient has denied it. Vital Signs   Recorded by Iran Sizer on 07 Nov 2015 01:06 PM BP:120/70,  BSA Calculated: 2.16 ,  BMI Calculated: 34.82 ,  Weight: 229 lb , BMI: 34.8 kg/m2,  Height: 5 ft 8 in. Physical Exam  APPEARANCE: Well developed, obese, in no acute distress.  Normal affect, in a pleasant mood.  Oriented to time, place and person. COMMUNICATION: Normal voice   HEAD & FACE:  No scars, lesions or masses of head and face.  Sinuses nontender to palpation.  Left parotid gland is has a 2x3cm round, rubbery mass. Facial strength symmetric.  EYES: EOMI with normal primary gaze alignment. Visual acuity grossly intact.  PERRLA EXTERNAL EAR & NOSE: No scars, lesions or masses  EAC & TYMPANIC MEMBRANE:  EAC shows excessive cerumen in both ear canals, unable to fully visualize TMs. GROSS HEARING:  Normal   TMJ:  Nontender  INTRANASAL EXAM: No polyps or purulence. Rightward septal deviation. NASOPHARYNX: Normal, without lesions. LIPS, TEETH & GUMS: No lip lesions and normal gums. Dentures upper. Lower teeth in poor repair. ORAL CAVITY/OROPHARYNX:  Oral mucosa moist  without lesion or asymmetry of the palate, tongue or posterior pharynx. Tonsils absent. NECK:  Supple without adenopathy or mass. THYROID:  Normal with no masses palpable.  NEUROLOGIC:  No gross CN deficits. No nystagmus noted.   LYMPHATIC:  No enlarged nodes palpable. Procedure  Fiberoptic Laryngoscopy Name: Maron Roshto     Age: 20 year     The risks and benefits of this procedure have been thoroughly discussed with the patient/parent.  The most commons risks outlined included but were not limited to: injury  to the nasal mucosa or throat irritation.  The patient/parent was further informed that there are other less common risks.  The patient/parent was given the opportunity to ask questions and all such questions were answered to the patient/parent's satisfaction.  Patient/parent acknowledged the risks and has agreed to proceed.   Performing Provider: Jolene Provost. Dr. [name] present for exam.  The risks of the procedure are minimal and were discussed with the patient today. Pre-op Diagnosis: dysphagia  Post-op Diagnosis: same Allergy:  reviewed allergies as listed Nasal Prep: Lidocaine/Afrin   Procedure: With the patient seated in the exam chair, the L nasal cavity was intubated with the flexible laryngoscope.  The nasal cavity mucosa, nasopharynx, hypopharynx and larynx were all examined with findings as noted below.  The scope was then removed.  The patient tolerated the procedure well without complication or blood loss (unless indicated in findings).   FINDINGS: Nasal mucosa: normal Nasopharynx: normal Hypopharynx: normal. Lingual tonsils normal and symmetric in size. Larynx: vocal cords with good mobility; no mass, nodule or polyp. Pyriform sinuses normal. When secretions cleared, no abnormal finding without pyriform sinuses.  Research scientist (medical)  Electronically signed by : Jolene Provost  PA-C; 11/07/2015 3:04 PM EST. Electronically signed by : Izora Gala  M.D.; 11/07/2015 3:35  PM EST.

## 2015-11-17 NOTE — Interval H&P Note (Signed)
History and Physical Interval Note:  11/17/2015 7:59 AM  Glenn Ortega  has presented today for surgery, with the diagnosis of LEFT PAROTID MASS  The various methods of treatment have been discussed with the patient and family. After consideration of risks, benefits and other options for treatment, the patient has consented to  Procedure(s): LEFT PAROTIDECTOMY (Left) as a surgical intervention .  The patient's history has been reviewed, patient examined, no change in status, stable for surgery.  I have reviewed the patient's chart and labs.  Questions were answered to the patient's satisfaction.     Jodye Scali

## 2015-11-17 NOTE — Anesthesia Preprocedure Evaluation (Signed)
Anesthesia Evaluation  Patient identified by MRN, date of birth, ID band Patient awake    Reviewed: Allergy & Precautions, H&P , NPO status , Patient's Chart, lab work & pertinent test results  Airway Mallampati: II   Neck ROM: full    Dental   Pulmonary former smoker,    breath sounds clear to auscultation       Cardiovascular hypertension,  Rhythm:regular Rate:Normal     Neuro/Psych  Neuromuscular disease    GI/Hepatic GERD  ,  Endo/Other  obese  Renal/GU      Musculoskeletal   Abdominal   Peds  Hematology   Anesthesia Other Findings   Reproductive/Obstetrics                             Anesthesia Physical Anesthesia Plan  ASA: II  Anesthesia Plan: General   Post-op Pain Management:    Induction: Intravenous  Airway Management Planned: Oral ETT  Additional Equipment:   Intra-op Plan:   Post-operative Plan: Extubation in OR  Informed Consent: I have reviewed the patients History and Physical, chart, labs and discussed the procedure including the risks, benefits and alternatives for the proposed anesthesia with the patient or authorized representative who has indicated his/her understanding and acceptance.     Plan Discussed with: CRNA, Anesthesiologist and Surgeon  Anesthesia Plan Comments:         Anesthesia Quick Evaluation

## 2015-11-18 ENCOUNTER — Encounter (HOSPITAL_BASED_OUTPATIENT_CLINIC_OR_DEPARTMENT_OTHER): Payer: Self-pay | Admitting: Otolaryngology

## 2015-11-18 NOTE — Discharge Summary (Signed)
  Physician Discharge Summary  Patient ID: Glenn Ortega MRN: [id number] DOB/AGE: [birth year] 82 y.o.  Admit date: 11/17/2015 Discharge date: 11/18/2015  Admission Diagnoses:parotid mass  Discharge Diagnoses:  Active Problems:   Parotid mass   Discharged Condition: good  Hospital Course: no complications  Consults: none  Significant Diagnostic Studies: none  Treatments: surgery: parotidectomy  Discharge Exam: Blood pressure 146/68, pulse 56, temperature 96.8 F (36 C), temperature source Oral, resp. rate 18, height 5\' 8"  (1.727 m), weight 103.239 kg (227 lb 9.6 oz), SpO2 95 %. PHYSICAL EXAM: Facial weakness more than yesterday. JP removed. Incision clean dry and intact.  Disposition: 01-Home or Self Care  Discharge Instructions    Diet - low sodium heart healthy    Complete by:  As directed      Increase activity slowly    Complete by:  As directed             Medication List    TAKE these medications        HYDROcodone-acetaminophen 7.5-325 MG tablet  Commonly known as:  NORCO  Take 1 tablet by mouth every 6 (six) hours as needed for moderate pain.     losartan 25 MG tablet  Commonly known as:  COZAAR  Take 1 tablet by mouth  daily     omeprazole 20 MG capsule  Commonly known as:  PRILOSEC  TAKE 1 CAPSULE BY MOUTH  DAILY AS NEEDED FOR ACID  REFLUX     promethazine 25 MG suppository  Commonly known as:  PHENERGAN  Place 1 suppository (25 mg total) rectally every 6 (six) hours as needed for nausea or vomiting.           Follow-up Information    Follow up with Izora Gala, MD. Schedule an appointment as soon as possible for a visit in 1 week.   Specialty:  Otolaryngology   Contact information:   [address] [place] [address] [phone]       Signed: Izora Gala 11/18/2015, 8:35 AM

## 2015-11-20 DIAGNOSIS — D119 Benign neoplasm of major salivary gland, unspecified: Secondary | ICD-10-CM | POA: Diagnosis not present

## 2016-01-12 ENCOUNTER — Other Ambulatory Visit: Payer: Self-pay | Admitting: *Deleted

## 2016-01-12 MED ORDER — LOSARTAN POTASSIUM 25 MG PO TABS: ORAL_TABLET | ORAL | Status: DC

## 2016-02-05 ENCOUNTER — Other Ambulatory Visit: Payer: Self-pay | Admitting: *Deleted

## 2016-02-05 MED ORDER — LOSARTAN POTASSIUM 25 MG PO TABS: ORAL_TABLET | ORAL | Status: DC

## 2016-03-30 ENCOUNTER — Telehealth: Payer: Self-pay | Admitting: Family Medicine

## 2016-03-30 DIAGNOSIS — S0452XA Injury of facial nerve, left side, initial encounter: Secondary | ICD-10-CM

## 2016-03-30 NOTE — Telephone Encounter (Signed)
Patient came in with his wife today for her office visit. He specifically requested a neurology referral. Ever since he had his left parotid gland removed in January, almost 5 months ago he has not been able to move the left side of his face. He drools on that side and has a hard time keeping food in his mouth.  Referral. [facility].

## 2016-05-03 ENCOUNTER — Ambulatory Visit (INDEPENDENT_AMBULATORY_CARE_PROVIDER_SITE_OTHER): Payer: PPO | Admitting: Family Medicine

## 2016-05-03 ENCOUNTER — Ambulatory Visit (INDEPENDENT_AMBULATORY_CARE_PROVIDER_SITE_OTHER): Payer: PPO

## 2016-05-03 ENCOUNTER — Encounter: Payer: Self-pay | Admitting: Family Medicine

## 2016-05-03 VITALS — BP 135/77 | HR 56 | Wt 230.0 lb

## 2016-05-03 DIAGNOSIS — M21242 Flexion deformity, left finger joints: Secondary | ICD-10-CM

## 2016-05-03 DIAGNOSIS — IMO0001 Reserved for inherently not codable concepts without codable children: Secondary | ICD-10-CM

## 2016-05-03 DIAGNOSIS — M25511 Pain in right shoulder: Secondary | ICD-10-CM | POA: Diagnosis not present

## 2016-05-03 DIAGNOSIS — S6992XA Unspecified injury of left wrist, hand and finger(s), initial encounter: Secondary | ICD-10-CM | POA: Diagnosis not present

## 2016-05-03 DIAGNOSIS — M20012 Mallet finger of left finger(s): Secondary | ICD-10-CM | POA: Diagnosis not present

## 2016-05-03 DIAGNOSIS — S4991XA Unspecified injury of right shoulder and upper arm, initial encounter: Secondary | ICD-10-CM | POA: Diagnosis not present

## 2016-05-03 DIAGNOSIS — M7989 Other specified soft tissue disorders: Secondary | ICD-10-CM | POA: Diagnosis not present

## 2016-05-03 NOTE — Patient Instructions (Signed)
Thank you for coming in today. Use the splint.  Do the exercises.  Return in 2-4 weeks.   Shoulder Range of Motion Exercises Shoulder range of motion (ROM) exercises are designed to keep the shoulder moving freely. They are often recommended for people who have shoulder pain. MOVEMENT EXERCISE When you are able, do this exercise 5-6 days per week, or as told by your health care provider. Work toward doing 2 sets of 10 swings. Pendulum Exercise How To Do This Exercise Lying Down  Lie face-down on a bed with your abdomen close to the side of the bed.  Let your arm hang over the side of the bed.  Relax your shoulder, arm, and hand.  Slowly and gently swing your arm forward and back. Do not use your neck muscles to swing your arm. They should be relaxed. If you are struggling to swing your arm, have someone gently swing it for you. When you do this exercise for the first time, swing your arm at a 15 degree angle for 15 seconds, or swing your arm 10 times. As pain lessens over time, increase the angle of the swing to 30-45 degrees.  Repeat steps 1-4 with the other arm. How To Do This Exercise While Standing  Stand next to a sturdy chair or table and hold on to it with your hand.  Bend forward at the waist.  Bend your knees slightly.  Relax your other arm and let it hang limp.  Relax the shoulder blade of the arm that is hanging and let it drop.  While keeping your shoulder relaxed, use body motion to swing your arm in small circles. The first time you do this exercise, swing your arm for about 30 seconds or 10 times. When you do it next time, swing your arm for a little longer.  Stand up tall and relax.  Repeat steps 1-7, this time changing the direction of the circles.  Repeat steps 1-8 with the other arm. STRETCHING EXERCISES Do these exercises 3-4 times per day on 5-6 days per week or as told by your health care provider. Work toward holding the stretch for 20  seconds. Stretching Exercise 1  Lift your arm straight out in front of you.  Bend your arm 90 degrees at the elbow (right angle) so your forearm goes across your body and looks like the letter "L."  Use your other arm to gently pull the elbow forward and across your body.  Repeat steps 1-3 with the other arm. Stretching Exercise 2 You will need a towel or rope for this exercise.  Bend one arm behind your back with the palm facing outward.  Hold a towel with your other hand.  Reach the arm that holds the towel above your head, and bend that arm at the elbow. Your wrist should be behind your neck.  Use your free hand to grab the free end of the towel.  With the higher hand, gently pull the towel up behind you.  With the lower hand, pull the towel down behind you.  Repeat steps 1-6 with the other arm. STRENGTHENING EXERCISES Do each of these exercises at four different times of day (sessions) every day or as told by your health care provider. To begin with, repeat each exercise 5 times (repetitions). Work toward doing 3 sets of 12 repetitions or as told by your health care provider. Strengthening Exercise 1 You will need a light weight for this activity. As you grow stronger, you may  use a heavier weight.  Standing with a weight in your hand, lift your arm straight out to the side until it is at the same height as your shoulder.  Bend your arm at 90 degrees so that your fingers are pointing to the ceiling.  Slowly raise your hand until your arm is straight up in the air.  Repeat steps 1-3 with the other arm. Strengthening Exercise 2 You will need a light weight for this activity. As you grow stronger, you may use a heavier weight. 1. Standing with a weight in your hand, gradually move your straight arm in an arc, starting at your side, then out in front of you, then straight up over your head. 2. Gradually move your other arm in an arc, starting at your side, then out in front  of you, then straight up over your head. 3. Repeat steps 1-2 with the other arm. Strengthening Exercise 3 You will need an elastic band for this activity. As you grow stronger, gradually increase the size of the bands or increase the number of bands that you use at one time. 1. While standing, hold an elastic band in one hand and raise that arm up in the air. 2. With your other hand, pull down the band until that hand is by your side. 3. Repeat steps 1-2 with the other arm.   This information is not intended to replace advice given to you by your health care provider. Make sure you discuss any questions you have with your health care provider.   Document Released: 07/03/2003 Document Revised: 02/18/2015 Document Reviewed: 09/30/2014 Elsevier Interactive Patient Education 2016 [place] Finger Mallet finger is an injury that occurs from a blow to the tip of your straightened finger or thumb. It is also known as baseball finger. The blow to your fingertip causes it to bend farther than normal, which tears the cord that attaches to the tip of your finger (extensor tendon). Your extensor tendon is what straightens the end of your finger. If this tendon is damaged, you will not be able to straighten your fingertip. Sometimes, a piece of bone may be pulled away with the tendon (avulsion injury), or the tendon may tear completely. In some cases, surgery may be required to repair the damage. CAUSES Mallet finger is caused by a hard, direct hit to the tip of your finger or thumb. This injury often happens from getting hit in the finger with a hard ball, such as a baseball. RISK FACTORS This injury is more likely to happen if you play sports that use a hard ball. SYMPTOMS  The main symptom of this injury is not being able to straighten the tip of your finger. You can manually straighten your fingertip with your other hand, but the finger cannot straighten on its own. Other symptoms may  include:  Pain.  Swelling.  Bruising.  Blood under the fingernail. DIAGNOSIS  Your health care provider may suspect mallet finger if you are not able to extend your fingertip, especially if you recently injured your hand. Your health care provider will do a physical exam. This may include X-rays to see if a piece of bone has been pulled away or if the finger joint has separated (dislocated). TREATMENT  Mallet finger may be treated with:  Wearing a splint on your fingertip to keep it straight (extended) while the tendon heals.  Surgery to repair the tendon, in severe cases. This may involve:  The use of a  pin or screw to keep your finger extended and your tendon attached.  Taking a piece of tendon from another part of your body (graft) to replace a torn tendon. HOME CARE INSTRUCTIONS   Take medicines only as directed by your health care provider.  Wear the splint as directed by your health care provider. Remove it only as directed by your health care provider.  If you take your splint off to dry it or change it, gently press your finger on a flat surface to keep it straight.  If directed, apply ice to the injured area:   Put ice in a plastic bag.   Place a towel between your skin and the bag.   Leave the ice on for 20 minutes, 2-3 times a day.  Raise the injured area above the level of your heart while you are sitting or lying down. SEEK MEDICAL CARE IF:   You have pain or swelling that is getting worse.   Your finger feels cold.   You cannot extend your finger after treatment. SEEK IMMEDIATE MEDICAL CARE IF:   Even after loosening your splint, your finger is:  Very red and swollen.  White or blue.  Numb or tingling.   This information is not intended to replace advice given to you by your health care provider. Make sure you discuss any questions you have with your health care provider.   Document Released: 10/01/2000 Document Revised: 02/18/2015 Document  Reviewed: 08/07/2014 Elsevier Interactive Patient Education [employer].

## 2016-05-03 NOTE — Progress Notes (Signed)
Glenn Ortega is a 82 y.o. male who presents to [facility] today for fall with right shoulder pain and left finger injury.  Patient tripped and fell 5 days ago landing on his right shoulder and injuring his left hand. He did not hit his head and denies any neck pain confusion or lightheadedness or dizziness. Overall is feeling pretty well. He's tried some over-the-counter medicines for pain which have helped a bit.  Right shoulder: Patient has right lateral shoulder pain. Pain is present with arm motion. He has difficulty abducting the arm and doing normal right arm activities such as reaching over his shoulder or getting to his hair. He denies any radiating pain in his right arm.  Left hand: Patient notes that he injured his left third digit. He notes he has a persistent deformity at the DIP where the fingers flexed downwards. He is unable to extend the finger. He notes moderate pain. He denies any other injury to his hand. No radiating pain weakness or numbness fevers or chills.   Past Medical History  Diagnosis Date  . GERD (gastroesophageal reflux disease)   . Spinal stenosis     lumbar  . Hypertension     EKG and chest x ray 8/12 [facility]/states had stress test 2 yrs ago- doesnt remember where- not in [facility]   Past Surgical History  Procedure Laterality Date  . Tonsillectomy    . Colonoscopy    . Trigger thumb  1980    right  . Cystoscopy  12/30/2011    Procedure: CYSTOSCOPY;  Surgeon: Reece Packer, MD;  Location: [facility];  Service: Urology;  Laterality: N/A;  . Hernia repair    . Prostate surgery    . Hernia repair Right     age  89  . Lumbar laminectomy/decompression microdiscectomy N/A 11/09/2013    Procedure: Lumbar Laminectomy/Decompression, Microdiscectomy Lumbar Three-Four, Four-Five, with Coflex;  Surgeon: Faythe Ghee, MD;  Location: [facility];   Service: Neurosurgery;  Laterality: N/A;  Lumbar Laminectomy/Decompression, Microdiscectomy Lumbar Three-Four, Four-Five, with Coflex  . Parotidectomy Left 11/17/2015    Procedure: LEFT PAROTIDECTOMY;  Surgeon: Izora Gala, MD;  Location: [facility];  Service: ENT;  Laterality: Left;   Social History  Substance Use Topics  . Smoking status: Former Smoker -- 1.00 packs/day for .5 years    Quit date: 12/26/1953  . Smokeless tobacco: Never Used     Comment: per patient he never smoke. 11/25/2014 /rc  . Alcohol Use: 1.2 oz/week    2 Standard drinks or equivalent per week     Comment: socially- occ beer   family history is not on file.  ROS as above:  Medications: Current Outpatient Prescriptions  Medication Sig Dispense Refill  . HYDROcodone-acetaminophen (NORCO) 7.5-325 MG tablet Take 1 tablet by mouth every 6 (six) hours as needed for moderate pain. 30 tablet 0  . losartan (COZAAR) 25 MG tablet Take 1 tablet by mouth  daily 90 tablet 1  . omeprazole (PRILOSEC) 20 MG capsule TAKE 1 CAPSULE BY MOUTH  DAILY AS NEEDED FOR ACID  REFLUX 90 capsule 3  . promethazine (PHENERGAN) 25 MG suppository Place 1 suppository (25 mg total) rectally every 6 (six) hours as needed for nausea or vomiting. 12 suppository 1  . [DISCONTINUED] terazosin (HYTRIN) 1 MG capsule take 1 capsule by mouth once daily 30 capsule 0   No current facility-administered medications for this visit.  Allergies  Allergen Reactions  . Penicillins Swelling     Exam:  BP 135/77 mmHg  Pulse 56  Wt 230 lb (104.327 kg) Gen: Well NAD HEENT: EOMI,  MMM Lungs: Normal work of breathing. CTABL Heart: RRR no MRG Abd: NABS, Soft. Nondistended, Nontender Exts: Brisk capillary refill, warm and well perfused.  Right shoulder: Normal-appearing. Tender palpation at the bicipital groove. Motion significantly limited in abduction actively to about 70. External rotation to about 30 beyond neutral position and internal  rotation to the L-spine. Strength: Diminished external and internal rotation 4+/5 right. Impaired abduction at 4/5 right. Left strength is normal. Pulses capillary refill and sensation are intact distally Left hand persistent mallet finger deformity at the third digit. Erythematous and tender at the dorsal DIP of the third digit. Intact flexion strength. Diminished extension strength and motion at the DIP.   A stack splint was fitted for the left third digit DIP.  No results found for this or any previous visit (from the past 24 hour(s)). Dg Shoulder Right  05/03/2016  CLINICAL DATA:  Pain following fall 5 days prior EXAM: RIGHT SHOULDER - 2+ VIEW COMPARISON:  None. FINDINGS: Frontal, Y scapular, and axillary images were obtained. There is no acute fracture or dislocation. There is mild generalized osteoarthritic change. No erosive change or intra-articular calcification. IMPRESSION: Mild generalized osteoarthritic change.  No fracture or dislocation. Electronically Signed   By: Lowella Grip III M.D.   On: 05/03/2016 09:37   Dg Finger Middle Left  05/03/2016  CLINICAL DATA:  Fall 5 days prior EXAM: LEFT THIRD FINGER 2+V COMPARISON:  None. FINDINGS: Frontal, oblique, and lateral views were obtained. There is persistent flexion at the third DIP joint. There is no acute fracture or dislocation. There is mild narrowing of the third DIP joint. There is generalized soft tissue swelling of the third digit. IMPRESSION: Persistent flexion at the third DIP joint. No acute fracture or dislocation. Mild narrowing of the third DIP joint is noted. There is generalized soft tissue swelling of the third digit. Electronically Signed   By: Lowella Grip III M.D.   On: 05/03/2016 09:37      Assessment and Plan: 82 y.o. male with  Right shoulder injury. Suspect rotator cuff tear of the supraspinatus tendon. Discussed options. Patient does not wish to have surgery for this issue. Plan for home range of  motion exercises. Recheck and 2-4 weeks.   Left mallet finger deformity: Discussed options. Plan for stack splint. Warned against flexion. Recheck in a few weeks.  Discussed warning signs or symptoms. Please see discharge instructions. Patient expresses understanding.  CC: METHENEY,CATHERINE, MD

## 2016-05-04 NOTE — Progress Notes (Signed)
Quick Note:  Shoulder xray shows arthritis ______

## 2016-05-10 ENCOUNTER — Ambulatory Visit (INDEPENDENT_AMBULATORY_CARE_PROVIDER_SITE_OTHER): Payer: PPO | Admitting: Neurology

## 2016-05-10 ENCOUNTER — Encounter: Payer: Self-pay | Admitting: Neurology

## 2016-05-10 VITALS — BP 120/78 | HR 62 | Ht 68.0 in | Wt 232.5 lb

## 2016-05-10 DIAGNOSIS — S0452XA Injury of facial nerve, left side, initial encounter: Secondary | ICD-10-CM | POA: Diagnosis not present

## 2016-05-10 DIAGNOSIS — R4781 Slurred speech: Secondary | ICD-10-CM | POA: Diagnosis not present

## 2016-05-10 DIAGNOSIS — R200 Anesthesia of skin: Secondary | ICD-10-CM

## 2016-05-10 DIAGNOSIS — R208 Other disturbances of skin sensation: Secondary | ICD-10-CM | POA: Diagnosis not present

## 2016-05-10 NOTE — Progress Notes (Signed)
[facility] Clinic Note - Initial Visit   Date: 05/10/16  Glenn Ortega MRN: [id number] DOB: [birth year]   Dear Dr. Madilyn Fireman:  Thank you for your kind referral of Glenn Ortega for consultation of left facial droop. Although his history is well known to you, please allow [place] to reiterate it for the purpose of our medical record. The patient was accompanied to the clinic by wife who also provides collateral information.     History of Present Illness: Glenn Ortega is a 82 y.o. right-handed Caucasian male with GERD, hypertension, and lumbar spinal stenosis s/p back surgery presenting for evaluation of left facial weakness.    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  His operative note was reviewed and did mention that a communicating branch of the facial nerve was not preserved due to deep location.  Additionally, the lateral greater auricular nerve was not preserved.  Post-op, he developed droopiness of the left lip, which he mostly noticed when he would drink things and it would drool down his face.  Over the following few months, he noticed improvement of drooling and is able to drink out of a cup now.  His primary complaint is slurred speech, especially after prolonged talking and in the evening. He also reports constant numbness over the left jaw and ear.  He denies any double vision, changes in taste, or droopiness of the eye.  No issues with increased salivation or sweating, such as when he sees food or thinks of food.    Out-side paper records, electronic medical record, and images have been reviewed where available and summarized as:  Lab Results  Component Value Date   TSH 3.165 02/25/2015   CT soft tissue neck w contrast 11/03/2015: 1. Homogeneously enhancing 2.1 cm left parotid mass at the posterior inferior margin of the superficial lobe of the left parotid gland. No  cervical lymphadenopathy. Favor a primary left parotid neoplasm, less likely metastatic disease. Ultrasound-guided tissue sampling is advised. 2. Nonspecific foci of asymmetric mucosal thickening in the right oropharynx and left piriform sinus, for which correlation with direct visualization is advised to exclude a mucosal neoplasm.   Past Medical History:  Diagnosis Date  . GERD (gastroesophageal reflux disease)   . Hypertension    EKG and chest x ray 8/12 [facility]/states had stress test 2 yrs ago- doesnt remember where- not in [facility]  . Spinal stenosis    lumbar    Past Surgical History:  Procedure Laterality Date  . COLONOSCOPY    . CYSTOSCOPY  12/30/2011   Procedure: CYSTOSCOPY;  Surgeon: Reece Packer, MD;  Location: [facility];  Service: Urology;  Laterality: N/A;  . HERNIA REPAIR    . HERNIA REPAIR Right    age  82  . LUMBAR LAMINECTOMY/DECOMPRESSION MICRODISCECTOMY N/A 11/09/2013   Procedure: Lumbar Laminectomy/Decompression, Microdiscectomy Lumbar Three-Four, Four-Five, with Coflex;  Surgeon: Faythe Ghee, MD;  Location: [facility];  Service: Neurosurgery;  Laterality: N/A;  Lumbar Laminectomy/Decompression, Microdiscectomy Lumbar Three-Four, Four-Five, with Coflex  . PAROTIDECTOMY Left 11/17/2015   Procedure: LEFT PAROTIDECTOMY;  Surgeon: Izora Gala, MD;  Location: [facility];  Service: ENT;  Laterality: Left;  . PROSTATE SURGERY    . TONSILLECTOMY    . trigger thumb  1980   right     Medications:  Outpatient Encounter Prescriptions as of 80/24/2017  Medication Sig  . losartan (COZAAR) 25 MG tablet Take 1  tablet by mouth  daily  . Multiple Vitamins-Minerals (PRESERVISION AREDS 2 PO) Take by mouth.  Marland Kitchen omeprazole (PRILOSEC) 20 MG capsule TAKE 1 CAPSULE BY MOUTH  DAILY AS NEEDED FOR ACID  REFLUX  . [DISCONTINUED] HYDROcodone-acetaminophen (NORCO) 7.5-325 MG tablet Take 1 tablet by mouth every 6 (six) hours as needed for moderate pain.  . [DISCONTINUED]  promethazine (PHENERGAN) 25 MG suppository Place 1 suppository (25 mg total) rectally every 6 (six) hours as needed for nausea or vomiting.   No facility-administered encounter medications on file as of 05/10/2016.      Allergies:  Allergies  Allergen Reactions  . Penicillins Swelling    Family History: Family History  Problem Relation Age of Onset  . Hypertension    . Cancer Mother   . Myasthenia gravis Father   . Heart disease Brother     Social History: Social History  Substance Use Topics  . Smoking status: Never Smoker  . Smokeless tobacco: Never Used     Comment: per patient he never smoke. 11/25/2014 /rc  . Alcohol use 1.2 oz/week    2 Standard drinks or equivalent per week     Comment: socially- occ beer   Social History   Social History Narrative   Works for [employer].   Married to [place]. 2 caffeinated drinks per day. Does not exercise he says secondary to back problems.  Lives in a one story home.  Has 2 children.  Education: some college.    Review of Systems:  CONSTITUTIONAL: No fevers, chills, night sweats, or weight loss.   EYES: No visual changes or eye pain ENT: No hearing changes.  No history of nose bleeds.  +slurred speech RESPIRATORY: No cough, wheezing and shortness of breath.   CARDIOVASCULAR: Negative for chest pain, and palpitations.   GI: Negative for abdominal discomfort, blood in stools or black stools.  No recent change in bowel habits.   GU:  No history of incontinence.   MUSCLOSKELETAL: No history of joint pain or swelling.  No myalgias.   SKIN: Negative for lesions, rash, and itching.   HEMATOLOGY/ONCOLOGY: Negative for prolonged bleeding, bruising easily, and swollen nodes.  No history of cancer.   ENDOCRINE: Negative for cold or heat intolerance, polydipsia or goiter.   PSYCH:  No depression or anxiety symptoms.   NEURO: As Above.   Vital Signs:  BP 120/78   Pulse 62   Ht 5\' 8"  (1.727 m)   Wt 232 lb 8 oz (105.5 kg)   SpO2 94%   BMI  35.35 kg/m  Pain Scale: 0 on a scale of 0-10   General Medical Exam:   General:  Well appearing, comfortable.   Eyes/ENT: see cranial nerve examination.  S/p left parotid mass resection with residual mild deformity inferior and anterior to ear Neck: No masses appreciated.  Full range of motion without tenderness.  No carotid bruits. Respiratory:  Clear to auscultation, good air entry bilaterally.   Cardiac:  Regular rate and rhythm, no murmur.   Extremities:  No deformities, edema, or skin discoloration.  Skin:  No rashes or lesions.  Neurological Exam: MENTAL STATUS including orientation to time, place, person, recent and remote memory, attention span and concentration, language, and fund of knowledge is normal.  Speech shows mild dysarthria with labial sounds ("p", "m"), lingual and gutteral sounds are normal.  CRANIAL NERVES: II:  No visual field defects.  Unremarkable fundi.   III-IV-VI: Pupils equal round and reactive to light.  Normal conjugate, extra-ocular eye  movements in all directions of gaze.  No nystagmus.  No ptosis.   V:  Reduced sensation to temperature and pin prick over the left greater auricle nerve distribution. Sensation intact bilaterally V1-V3.   VII:  Frontalis, orbicularis oculi is 5/5.  There is mild weakness of the left buccinator and orbicularis oris 4/5. Smile looks symmetric.   VIII:  Reduced finger rub on the left.    IX-X:  Normal palatal movement.   XI:  Normal shoulder shrug and head rotation.   XII:  Normal tongue strength and range of motion, no deviation or fasciculation.  MOTOR:  Motor strength is 5/5 throughout.  No atrophy, fasciculations or abnormal movements.  No pronator drift.  Tone is normal.    MSRs:  Reflexes are brisk and symmetric 2+/4 throughout.  Plantars are downgoing.  SENSORY:  Normal and symmetric perception of light touch, pinprick, and vibration.  COORDINATION/GAIT: Normal finger-to- nose-finger.  Intact rapid alternating  movements bilaterally.  Gait narrow based, slow, and stable. Tandem gait intact.   IMPRESSION: Mr. Knetter is a 82 year-old gentleman referred for left facial weakness and paresthesias.  He underwent left parotid mass resection in January 2017 and due to its deep location, a small communication branch of the facial nerve and greater auricular nerve was sacrificed.  Facial nerve injury is more common complication of deep tumors, which patient was aware of.  Clinically, his neurological deficits correlate well with his nerve injury.  No evidence of Frey's syndrome.  He should continue to see neurological motor recovery for the next few months, however, I explained that sensory loss may be more of a lasting deficit.  For his labial dysarthria, he will be referred to speech therapy.    Return to clinic as needed  The duration of this appointment visit was 40 minutes of face-to-face time with the patient.  Greater than 50% of this time was spent in counseling, explanation of diagnosis, planning of further management, and coordination of care.   Thank you for allowing me to participate in patient's care.  If I can answer any additional questions, I would be pleased to do so.    Sincerely,    Angeleena Dueitt K. Posey Pronto, DO

## 2016-05-10 NOTE — Patient Instructions (Signed)
1.  Referral to speech therapy will be made 2.  If your symptoms worsen, please come back and see me

## 2016-05-12 ENCOUNTER — Ambulatory Visit: Payer: PPO | Admitting: Neurology

## 2016-05-17 ENCOUNTER — Encounter: Payer: Self-pay | Admitting: Family Medicine

## 2016-05-17 ENCOUNTER — Ambulatory Visit (INDEPENDENT_AMBULATORY_CARE_PROVIDER_SITE_OTHER): Payer: PPO | Admitting: Family Medicine

## 2016-05-17 VITALS — BP 156/74 | HR 56 | Wt 231.0 lb

## 2016-05-17 DIAGNOSIS — S4991XA Unspecified injury of right shoulder and upper arm, initial encounter: Secondary | ICD-10-CM

## 2016-05-17 DIAGNOSIS — G5601 Carpal tunnel syndrome, right upper limb: Secondary | ICD-10-CM

## 2016-05-17 DIAGNOSIS — IMO0001 Reserved for inherently not codable concepts without codable children: Secondary | ICD-10-CM

## 2016-05-17 DIAGNOSIS — M20012 Mallet finger of left finger(s): Secondary | ICD-10-CM

## 2016-05-17 NOTE — Progress Notes (Signed)
Glenn Ortega is a 82 y.o. male who presents to [facility] today with multiple orthopedic complaints.   He  Injured his left long finger and right shoulder after a fall several weeks ago.  He presented several days later and was found to have a mallet finger deformity and possible supraspinatus tendon tear.  He was treated with a stack splint and physical therapy exercises.  Patient reports improvement in both injuries and says he only notices occasional pain in his lateral right shoulder at night.  He reports improved strength and range of motion in his shoulder with the physical therapy exercises.  Bilateral hand numbness in radial 3 digits, R>L: He has a history of carpal tunnel syndrome requiring a steroid injection in his right carpal tunnel 9 months ago.  His symptoms improved after the injection but recurred after 4 or 5 months.  He denies hand weakness, neck pain, and radicular symptoms.  Patient claims his hands feel numb throughout the day and states they get worse driving or holding his phone.   Past Medical History:  Diagnosis Date  . GERD (gastroesophageal reflux disease)   . Hypertension    EKG and chest x ray 8/12 [facility]/states had stress test 2 yrs ago- doesnt remember where- not in [facility]  . Spinal stenosis    lumbar   Past Surgical History:  Procedure Laterality Date  . COLONOSCOPY    . CYSTOSCOPY  12/30/2011   Procedure: CYSTOSCOPY;  Surgeon: Reece Packer, MD;  Location: [facility];  Service: Urology;  Laterality: N/A;  . HERNIA REPAIR    . HERNIA REPAIR Right    age  56  . LUMBAR LAMINECTOMY/DECOMPRESSION MICRODISCECTOMY N/A 11/09/2013   Procedure: Lumbar Laminectomy/Decompression, Microdiscectomy Lumbar Three-Four, Four-Five, with Coflex;  Surgeon: Faythe Ghee, MD;  Location: [facility];  Service: Neurosurgery;  Laterality: N/A;  Lumbar  Laminectomy/Decompression, Microdiscectomy Lumbar Three-Four, Four-Five, with Coflex  . PAROTIDECTOMY Left 11/17/2015   Procedure: LEFT PAROTIDECTOMY;  Surgeon: Izora Gala, MD;  Location: [facility];  Service: ENT;  Laterality: Left;  . PROSTATE SURGERY    . TONSILLECTOMY    . trigger thumb  1980   right   Social History  Substance Use Topics  . Smoking status: Never Smoker  . Smokeless tobacco: Never Used     Comment: per patient he never smoke. 11/25/2014 /rc  . Alcohol use 1.2 oz/week    2 Standard drinks or equivalent per week     Comment: socially- occ beer   family history includes Cancer in his mother; Heart disease in his brother; Myasthenia gravis in his father.  ROS as above   Medications: Current Outpatient Prescriptions  Medication Sig Dispense Refill  . losartan (COZAAR) 25 MG tablet Take 1 tablet by mouth  daily 90 tablet 1  . Multiple Vitamins-Minerals (PRESERVISION AREDS 2 PO) Take by mouth.    Marland Kitchen omeprazole (PRILOSEC) 20 MG capsule TAKE 1 CAPSULE BY MOUTH  DAILY AS NEEDED FOR ACID  REFLUX 90 capsule 3   No current facility-administered medications for this visit.    Allergies  Allergen Reactions  . Penicillins Swelling     Exam:  BP (!) 156/74   Pulse (!) 56   Wt 231 lb (104.8 kg)   BMI 35.12 kg/m  Gen: Well NAD Exts: Brisk capillary refill, warm and well perfused.  Neck: full range of motion  Right shoulder: no obvious deformities.  Posterior reach with right arm limited to lumbar spine.  Full range of motion otherwise Strength, sensation, and pulses intact Empty can test negative  Hands: normal appearing without muscular atrophy. Stack splint on left long finger Strength and sensation intact Negative Tinel and manual compression tests bilaterally  Procedure: Real-time Ultrasound Guided Injection of Right wrist median nerve hydrodissection and carpal tunnel  Device: GE Logiq E  Images permanently stored and available for  review in the ultrasound unit. Verbal informed consent obtained. Discussed risks and benefits of procedure. Warned about infection bleeding damage to structures skin hypopigmentation and fat atrophy among others. Patient expresses understanding and agreement Time-out conducted.  Noted no overlying erythema, induration, or other signs of local infection.  Skin prepped in a sterile fashion.  Local anesthesia: Topical Ethyl chloride.  With sterile technique and under real time ultrasound guidance: A 25-gauge needle was advanced under ultrasound guidance to the area deep to the median nerve. A small amount of fluid was injected. The needle again was removed and placed superficially to the median nerve again's fluid was injected. Using this approach the median nerve was hydrodissected and the carpal tunnel. A total of 3 mL of lidocaine and 40 mg of Kenalog were injected.  Completed without difficulty  Vision tolerated the procedure well.  Lot number for lidocaine [id number] Lot number for Kenalog AAP [id number] Advised to call if fevers/chills, erythema, induration, drainage, or persistent bleeding.  Images permanently stored and available for review in the ultrasound unit.  Impression: Technically successful ultrasound guided injection.     No results found for this or any previous visit (from the past 24 hour(s)). No results found.    Assessment and Plan: 82 y.o. male with   1. Bilateral carpal tunnel syndrome, R>L   - Right carpal tunnel Kenalog median nerve hydrodissection Return in a few weeks  2. Right shoulder injury, improving: possible supraspinatus tear   - continue shoulder physical therapy exercises Patient is doing well  3. Left long finger mallet deformity   - continue wearing stack splint for 4 more weeks. Patient is doing well   No orders of the defined types were placed in this encounter.   Discussed warning signs or symptoms. Please see discharge  instructions. Patient expresses understanding.

## 2016-05-17 NOTE — Patient Instructions (Signed)
Thank you for coming in today. Return soon for the left side injection.  Take it easy for a few weeks.     Call or go to the ER if you develop a large red swollen joint with extreme pain or oozing puss.

## 2016-05-24 ENCOUNTER — Ambulatory Visit: Payer: PPO | Admitting: Family Medicine

## 2016-05-31 ENCOUNTER — Encounter: Payer: Self-pay | Admitting: Family Medicine

## 2016-05-31 ENCOUNTER — Ambulatory Visit (INDEPENDENT_AMBULATORY_CARE_PROVIDER_SITE_OTHER): Payer: PPO | Admitting: Family Medicine

## 2016-05-31 DIAGNOSIS — G5602 Carpal tunnel syndrome, left upper limb: Secondary | ICD-10-CM

## 2016-05-31 NOTE — Progress Notes (Signed)
Glenn Ortega is a 82 y.o. male who presents to [name]: [facility] today for follow-up left carpal tunnel syndrome. Patient has bilateral carpal tunnel syndrome. She did very well with a right carpal tunnel injection a few weeks ago and would like a left injection today. He's had a trial of rest and splinting which have helped only a little. He notes numbness and tingling to the radial third digits of his left hand.   Past Medical History:  Diagnosis Date  . GERD (gastroesophageal reflux disease)   . Hypertension    EKG and chest x ray 8/12 [facility]/states had stress test 2 yrs ago- doesnt remember where- not in [facility]  . Spinal stenosis    lumbar   Past Surgical History:  Procedure Laterality Date  . COLONOSCOPY    . CYSTOSCOPY  12/30/2011   Procedure: CYSTOSCOPY;  Surgeon: Reece Packer, MD;  Location: [facility];  Service: Urology;  Laterality: N/A;  . HERNIA REPAIR    . HERNIA REPAIR Right    age  58  . LUMBAR LAMINECTOMY/DECOMPRESSION MICRODISCECTOMY N/A 11/09/2013   Procedure: Lumbar Laminectomy/Decompression, Microdiscectomy Lumbar Three-Four, Four-Five, with Coflex;  Surgeon: Faythe Ghee, MD;  Location: [facility];  Service: Neurosurgery;  Laterality: N/A;  Lumbar Laminectomy/Decompression, Microdiscectomy Lumbar Three-Four, Four-Five, with Coflex  . PAROTIDECTOMY Left 11/17/2015   Procedure: LEFT PAROTIDECTOMY;  Surgeon: Izora Gala, MD;  Location: [facility];  Service: ENT;  Laterality: Left;  . PROSTATE SURGERY    . TONSILLECTOMY    . trigger thumb  1980   right   Social History  Substance Use Topics  . Smoking status: Never Smoker  . Smokeless tobacco: Never Used     Comment: per patient he never smoke. 11/25/2014 /rc  . Alcohol use 1.2 oz/week    2 Standard drinks or equivalent per week     Comment: socially- occ beer   family history  includes Cancer in his mother; Heart disease in his brother; Myasthenia gravis in his father.  ROS as above:  Medications: Current Outpatient Prescriptions  Medication Sig Dispense Refill  . losartan (COZAAR) 25 MG tablet Take 1 tablet by mouth  daily 90 tablet 1  . Multiple Vitamins-Minerals (PRESERVISION AREDS 2 PO) Take by mouth.    Marland Kitchen omeprazole (PRILOSEC) 20 MG capsule TAKE 1 CAPSULE BY MOUTH  DAILY AS NEEDED FOR ACID  REFLUX 90 capsule 3   No current facility-administered medications for this visit.    Allergies  Allergen Reactions  . Penicillins Swelling     Exam:  BP 138/74   Pulse 80   Wt 226 lb (102.5 kg)   BMI 34.36 kg/m  Gen: Well NAD Left wrist normal-appearing nontender positive Tinel's. Normal grip strength. Capillary refill and sensation intact distally. Pulses intact at the radial and ulnar arteries.  Procedure: Real-time Ultrasound Guided Injection of left wrist median nerve hydrodissection and carpal tunnel  Device: GE Logiq E  Images permanently stored and available for review in the ultrasound unit. Verbal informed consent obtained. Discussed risks and benefits of procedure. Warned about infection bleeding damage to structures skin hypopigmentation and fat atrophy among others. Patient expresses understanding and agreement Time-out conducted.  Noted no overlying erythema, induration, or other signs of local infection.  Skin prepped in a sterile fashion.  Local anesthesia: Topical Ethyl chloride.  With sterile technique and under real time ultrasound guidance: A 25-gauge needle was advanced under ultrasound  guidance to the area deep to the median nerve. A small amount of fluid was injected. The needle again was removed and placed superficially to the median nerve again's fluid was injected. Using this approach the median nerve was hydrodissected and the carpal tunnel. A total of 3 mL of lidocaine and 40 mg of Kenalog were injected.  Completed without  difficulty  Vision tolerated the procedure well.  Lot number for lidocaine [id number] Lot number for Kenalog AAP [id number] Advised to call if fevers/chills, erythema, induration, drainage, or persistent bleeding.  Images permanently stored and available for review in the ultrasound unit.  Impression: Technically successful ultrasound guided injection.   No results found for this or any previous visit (from the past 24 hour(s)). No results found.    Assessment and Plan: 82 y.o. male with left carpal tunnel syndrome. Injection today. Return in a few weeks for recheck and reevaluation.   No orders of the defined types were placed in this encounter.   Discussed warning signs or symptoms. Please see discharge instructions. Patient expresses understanding.

## 2016-05-31 NOTE — Patient Instructions (Signed)
Thank you for coming in today. Call or go to the ER if you develop a large red swollen joint with extreme pain or oozing puss.  Return in a month or so.  Take it easy.   Use a brace as needed.

## 2016-09-14 ENCOUNTER — Encounter: Payer: Self-pay | Admitting: Podiatry

## 2016-09-14 ENCOUNTER — Ambulatory Visit (INDEPENDENT_AMBULATORY_CARE_PROVIDER_SITE_OTHER): Payer: PPO | Admitting: Podiatry

## 2016-09-14 DIAGNOSIS — B351 Tinea unguium: Secondary | ICD-10-CM

## 2016-09-14 DIAGNOSIS — M79675 Pain in left toe(s): Secondary | ICD-10-CM | POA: Diagnosis not present

## 2016-09-14 DIAGNOSIS — L600 Ingrowing nail: Secondary | ICD-10-CM

## 2016-09-14 DIAGNOSIS — M79674 Pain in right toe(s): Secondary | ICD-10-CM

## 2016-09-14 NOTE — Patient Instructions (Signed)
Ingrown Toenail An ingrown toenail occurs when the corner or sides of your toenail grow into the surrounding skin. The big toe is most commonly affected, but it can happen to any of your toes. If your ingrown toenail is not treated, you will be at risk for infection. What are the causes? This condition may be caused by:  Wearing shoes that are too small or tight.  Injury or trauma, such as stubbing your toe or having your toe stepped on.  Improper cutting or care of your toenails.  Being born with (congenital) nail or foot abnormalities, such as having a nail that is too big for your toe. What increases the risk? Risk factors for an ingrown toenail include:  Age. Your nails tend to thicken as you get older, so ingrown nails are more common in older people.  Diabetes.  Cutting your toenails incorrectly.  Blood circulation problems. What are the signs or symptoms? Symptoms may include:  Pain, soreness, or tenderness.  Redness.  Swelling.  Hardening of the skin surrounding the toe. Your ingrown toenail may be infected if there is fluid, pus, or drainage. How is this diagnosed? An ingrown toenail may be diagnosed by medical history and physical exam. If your toenail is infected, your health care provider may test a sample of the drainage. How is this treated? Treatment depends on the severity of your ingrown toenail. Some ingrown toenails may be treated at home. More severe or infected ingrown toenails may require surgery to remove all or part of the nail. Infected ingrown toenails may also be treated with antibiotic medicines. Follow these instructions at home:  If you were prescribed an antibiotic medicine, finish all of it even if you start to feel better.  Soak your foot in warm soapy water for 20 minutes, 3 times per day or as directed by your health care provider.  Carefully lift the edge of the nail away from the sore skin by wedging a small piece of cotton under the  corner of the nail. This may help with the pain. Be careful not to cause more injury to the area.  Wear shoes that fit well. If your ingrown toenail is causing you pain, try wearing sandals, if possible.  Trim your toenails regularly and carefully. Do not cut them in a curved shape. Cut your toenails straight across. This prevents injury to the skin at the corners of the toenail.  Keep your feet clean and dry.  If you are having trouble walking and are given crutches by your health care provider, use them as directed.  Do not pick at your toenail or try to remove it yourself.  Take medicines only as directed by your health care provider.  Keep all follow-up visits as directed by your health care provider. This is important. Contact a health care provider if:  Your symptoms do not improve with treatment. Get help right away if:  You have red streaks that start at your foot and go up your leg.  You have a fever.  You have increased redness, swelling, or pain.  You have fluid, blood, or pus coming from your toenail. This information is not intended to replace advice given to you by your health care provider. Make sure you discuss any questions you have with your health care provider. Document Released: 10/01/2000 Document Revised: 03/05/2016 Document Reviewed: 08/28/2014 Elsevier Interactive Patient Education  2017 [employer] Inc[employer]  

## 2016-09-14 NOTE — Progress Notes (Signed)
   Subjective:    Patient ID: Glenn Ortega, male    DOB: 09/02/1934, 82 y.o.   MRN: [id number]  HPI  82 year old male presents the office today for concerns of ingrown tenderness with his big toes as well as for thick, painful, elongated to that he cannot trim himself. They're painful to pressure in shoe gear. Denies any redness or drainage or any swelling. No recent treatment. No other complaints.  Review of Systems  All other systems reviewed and are negative.      Objective:   Physical Exam General: AAO x3, NAD  Dermatological: Nails are hypertrophic, dystrophic, brittle, discolored, elongated 10. There is incurvation of both the medial and lateral nail borders of the bilateral hallux. No surrounding redness or drainage. Tenderness nails 1-5 bilaterally. No open lesions or pre-ulcerative lesions are identified today.  Vascular: Dorsalis Pedis artery and Posterior Tibial artery pedal pulses are 2/4 bilateral with immedate capillary fill time. There is no pain with calf compression, swelling, warmth, erythema.   Neruologic: Grossly intact via light touch bilateral. Vibratory intact via tuning fork bilateral. Protective threshold with Semmes Wienstein monofilament intact to all pedal sites bilateral.   Musculoskeletal: No gross boney pedal deformities bilateral. No pain, crepitus, or limitation noted with foot and ankle range of motion bilateral. Muscular strength 5/5 in all groups tested bilateral.  Gait: Unassisted, Nonantalgic.         Assessment & Plan:  -Treatment options discussed including all alternatives, risks, and complications -Etiology of symptoms were discussed -Nails debrided 10 without complications or bleeding. Slantback procedure performed of bilateral hallux. Discussed with him that if symptoms continue partial nail avulsion and wishes to hold off on the procedure today.  -Daily foot inspection -Follow-up in 3 months or sooner if any problems arise. In the  meantime, encouraged to call the office with any questions, concerns, change in symptoms.   Celesta Gentile, DPM

## 2016-12-06 ENCOUNTER — Telehealth: Payer: Self-pay | Admitting: Family Medicine

## 2016-12-14 DIAGNOSIS — R291 Meningismus: Secondary | ICD-10-CM | POA: Diagnosis not present

## 2016-12-14 DIAGNOSIS — Z87438 Personal history of other diseases of male genital organs: Secondary | ICD-10-CM | POA: Diagnosis not present

## 2016-12-14 DIAGNOSIS — B369 Superficial mycosis, unspecified: Secondary | ICD-10-CM | POA: Diagnosis not present

## 2016-12-15 NOTE — Telephone Encounter (Signed)
none

## 2016-12-28 ENCOUNTER — Encounter: Payer: Self-pay | Admitting: Podiatry

## 2016-12-28 ENCOUNTER — Ambulatory Visit (INDEPENDENT_AMBULATORY_CARE_PROVIDER_SITE_OTHER): Payer: PPO | Admitting: Podiatry

## 2016-12-28 DIAGNOSIS — M79674 Pain in right toe(s): Secondary | ICD-10-CM | POA: Diagnosis not present

## 2016-12-28 DIAGNOSIS — B351 Tinea unguium: Secondary | ICD-10-CM | POA: Diagnosis not present

## 2016-12-28 DIAGNOSIS — M79675 Pain in left toe(s): Secondary | ICD-10-CM | POA: Diagnosis not present

## 2016-12-28 NOTE — Progress Notes (Signed)
Subjective: 82 y.o. returns the office today for painful, elongated, thickened toenails which he cannot trim himself. Denies any redness or drainage around the nails. Denies any acute changes since last appointment and no new complaints today. Denies any systemic complaints such as fevers, chills, nausea, vomiting.   Objective: AAO 3, NAD DP/PT pulses palpable, CRT less than 3 seconds Nails hypertrophic, dystrophic, elongated, brittle, discolored 10. There is tenderness overlying the nails 1-5 bilaterally. There is no surrounding erythema or drainage along the nail sites. No open lesions or pre-ulcerative lesions are identified. No other areas of tenderness bilateral lower extremities. No overlying edema, erythema, increased warmth. No pain with calf compression, swelling, warmth, erythema.  Assessment: Patient presents with symptomatic onychomycosis  Plan: -Treatment options including alternatives, risks, complications were discussed -Nails sharply debrided 10 without complication/bleeding. -Discussed daily foot inspection. If there are any changes, to call the office immediately.  -Follow-up in 3 months or sooner if any problems are to arise. In the meantime, encouraged to call the office with any questions, concerns, changes symptoms.  Athalene Kolle, DPM  

## 2017-01-07 ENCOUNTER — Encounter: Payer: Self-pay | Admitting: Family Medicine

## 2017-01-07 ENCOUNTER — Telehealth: Payer: Self-pay

## 2017-01-07 ENCOUNTER — Other Ambulatory Visit: Payer: Self-pay

## 2017-01-07 ENCOUNTER — Ambulatory Visit (INDEPENDENT_AMBULATORY_CARE_PROVIDER_SITE_OTHER): Payer: PPO | Admitting: Family Medicine

## 2017-01-07 VITALS — BP 119/71 | HR 48 | Ht 68.0 in | Wt 233.0 lb

## 2017-01-07 DIAGNOSIS — G5602 Carpal tunnel syndrome, left upper limb: Secondary | ICD-10-CM

## 2017-01-07 DIAGNOSIS — I270 Primary pulmonary hypertension: Secondary | ICD-10-CM

## 2017-01-07 MED ORDER — LOSARTAN POTASSIUM 25 MG PO TABS: ORAL_TABLET | ORAL | 1 refills | Status: DC

## 2017-01-07 MED ORDER — LOSARTAN POTASSIUM 25 MG PO TABS: ORAL_TABLET | ORAL | 0 refills | Status: DC

## 2017-01-07 NOTE — Telephone Encounter (Signed)
He is here now seeing Dr. Georgina Snell, can we use the BP today and send him down for labs?

## 2017-01-07 NOTE — Progress Notes (Signed)
Glenn Ortega is a 83 y.o. male who presents to [facility] today for follow up bilateral carpel tunnel syndrome.  Patient was seen previously for bilateral carpal tunnel syndrome and last had an injection in his left carpal tunnel August 2017. He notes it's worked well until recently. He notes returning of bilateral carpal tunnel syndrome left worse than right over the last few weeks. He notes numbness and tingling into the palmar radial hand. Symptoms are consistent with previous episodes of carpal tunnel syndrome. He has resumed night splinting which has helped only a little. He denies significant weakness.   Past Medical History:  Diagnosis Date  . GERD (gastroesophageal reflux disease)   . Hypertension    EKG and chest x ray 8/12 [facility]/states had stress test 2 yrs ago- doesnt remember where- not in EPIC  . Spinal stenosis    lumbar   Past Surgical History:  Procedure Laterality Date  . COLONOSCOPY    . CYSTOSCOPY  12/30/2011   Procedure: CYSTOSCOPY;  Surgeon: Reece Packer, MD;  Location: [facility];  Service: Urology;  Laterality: N/A;  . HERNIA REPAIR    . HERNIA REPAIR Right    age  73  . LUMBAR LAMINECTOMY/DECOMPRESSION MICRODISCECTOMY N/A 11/09/2013   Procedure: Lumbar Laminectomy/Decompression, Microdiscectomy Lumbar Three-Four, Four-Five, with Coflex;  Surgeon: Faythe Ghee, MD;  Location: [facility];  Service: Neurosurgery;  Laterality: N/A;  Lumbar Laminectomy/Decompression, Microdiscectomy Lumbar Three-Four, Four-Five, with Coflex  . PAROTIDECTOMY Left 11/17/2015   Procedure: LEFT PAROTIDECTOMY;  Surgeon: Izora Gala, MD;  Location: [facility];  Service: ENT;  Laterality: Left;  . PROSTATE SURGERY    . TONSILLECTOMY    . trigger thumb  1980   right   Social History  Substance Use Topics  . Smoking status: Never Smoker  . Smokeless tobacco: Never Used     Comment: per patient he never smoke. 11/25/2014  /rc  . Alcohol use 1.2 oz/week    2 Standard drinks or equivalent per week     Comment: socially- occ beer     ROS:  As above   Medications: Current Outpatient Prescriptions  Medication Sig Dispense Refill  . Multiple Vitamins-Minerals (PRESERVISION AREDS 2 PO) Take by mouth.    Marland Kitchen omeprazole (PRILOSEC) 20 MG capsule TAKE 1 CAPSULE BY MOUTH  DAILY AS NEEDED FOR ACID  REFLUX 90 capsule 3  . losartan (COZAAR) 25 MG tablet Take 1 tablet by mouth  daily 14 tablet 0   No current facility-administered medications for this visit.    Allergies  Allergen Reactions  . Penicillins Swelling     Exam:  BP 119/71   Pulse (!) 48   Ht 5\' 8"  (1.727 m)   Wt 233 lb (105.7 kg)   BMI 35.43 kg/m  General: Well Developed, well nourished, and in no acute distress.  Neuro/Psych: Alert and oriented x3, extra-ocular muscles intact, able to move all 4 extremities, sensation grossly intact. Skin: Warm and dry, no rashes noted.  Respiratory: Not using accessory muscles, speaking in full sentences, trachea midline.  Cardiovascular: Pulses palpable, no extremity edema. Abdomen: Does not appear distended. MSK: Left hand: No thenar atrophy. Decreased sensation radial palmar hand. Intact grip strength.   Procedure: Real-time Ultrasound Guided Injection of left wrist median nerve hydrodissection and carpal tunnel Device: GE Logiq E  Images permanently stored and available for review in the ultrasound unit. Verbal informed consent obtained. Discussed risks and benefits of procedure. Warned  about infection bleeding damage to structures skin hypopigmentation and fat atrophy among others. Patient expresses understanding and agreement Time-out conducted.  Noted no overlying erythema, induration, or other signs of local infection.  Skin prepped in a sterile fashion.  Local anesthesia: Topical Ethyl chloride.  With sterile technique and under real time ultrasound guidance: A 25-gauge needle was  advanced under ultrasound guidance to the area superficial to the median nerve. . A total of 3 mL of lidocaine and 40 mg of Kenalog were injected around the median nerve. Completed without difficulty  Patient tolerated the procedure well.  Advised to call if fevers/chills, erythema, induration, drainage, or persistent bleeding.  Images permanently stored and available for review in the ultrasound unit.  Impression: Technically successful ultrasound guided injection.   No results found for this or any previous visit (from the past 48 hour(s)). No results found.    Assessment and Plan: 83 y.o. male with bilateral left worse than right carpal tunnel syndrome. Injected today. Return as needed in the near future for right-sided injection. Recommend night splints. Return as needed    No orders of the defined types were placed in this encounter.   Discussed warning signs or symptoms. Please see discharge instructions. Patient expresses understanding.

## 2017-01-07 NOTE — Telephone Encounter (Signed)
Sent script

## 2017-01-07 NOTE — Telephone Encounter (Signed)
Pt is requesting a refill of losartan sent to [facility] in chart.  Please advise.

## 2017-01-07 NOTE — Telephone Encounter (Signed)
Ok for 2 week supply and the call him and tell hm needs to schedule appt for his BP and labs.

## 2017-01-07 NOTE — Telephone Encounter (Signed)
Ok to go to lab for CMP and lipid but still needs an appt within a month.  [name], MD

## 2017-01-07 NOTE — Patient Instructions (Signed)
Thank you for coming in today. Return in at least 1 week for right injection as needed.  You can schedule for orthotics any time.  Call or go to the ER if you develop a large red swollen joint with extreme pain or oozing puss.  Use the wrist brace for a week or so.

## 2017-01-07 NOTE — Telephone Encounter (Signed)
Labs ordered, notified patient

## 2017-03-29 ENCOUNTER — Ambulatory Visit: Payer: PPO | Admitting: Podiatry

## 2017-04-06 IMAGING — US US SOFT TISSUE HEAD/NECK
1 series · 13 of 25 positions shown · non-contrast
Comparison: None

CLINICAL DATA: 81-year-old male with palpable knot at the left of
the jaw which is slowly enlarging and tender to the touch.

EXAM:
ULTRASOUND OF HEAD/NECK SOFT TISSUES
TECHNIQUE: Ultrasound examination of the head and neck soft tissues was
performed in the area of clinical concern.

[Series 1: us soft tissue head/neck · 0.07mm/px · 13 of 36 slices shown]
[im 1/36]
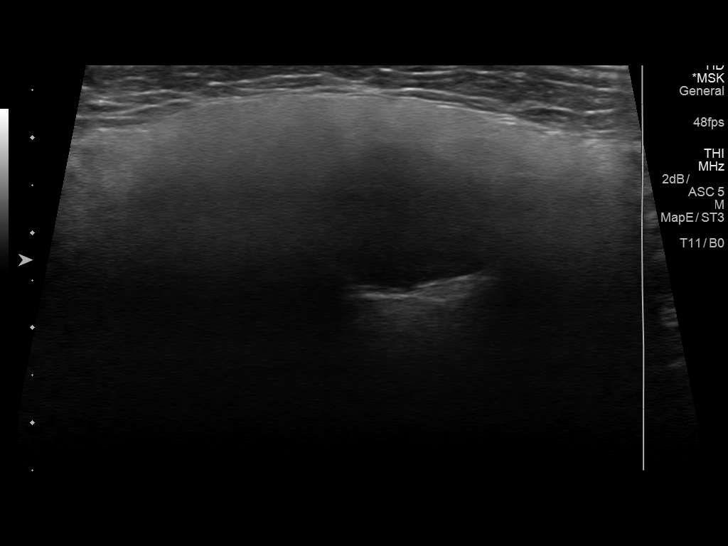
[im 3/36]
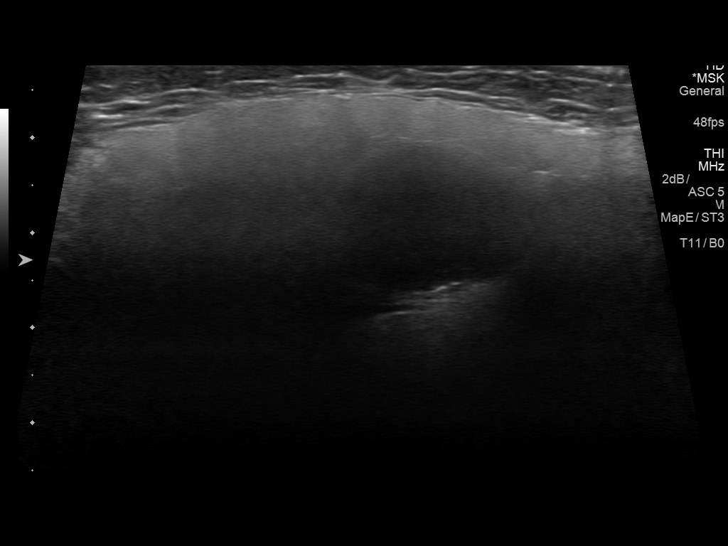
[im 6/36]
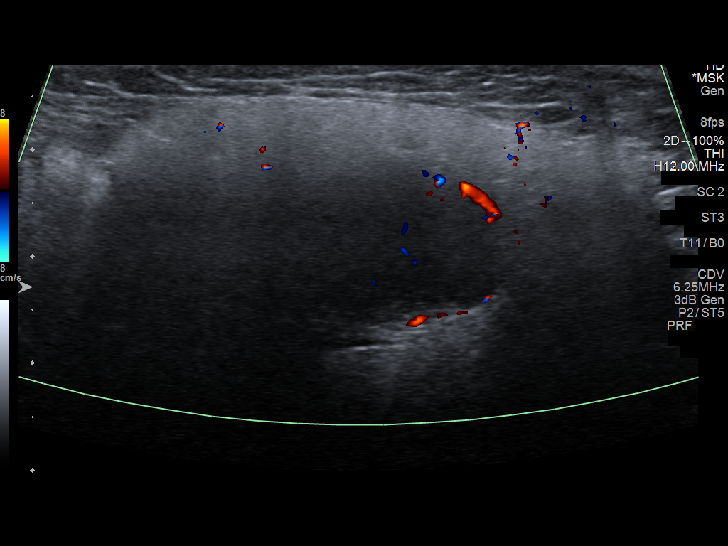
[im 9/36]
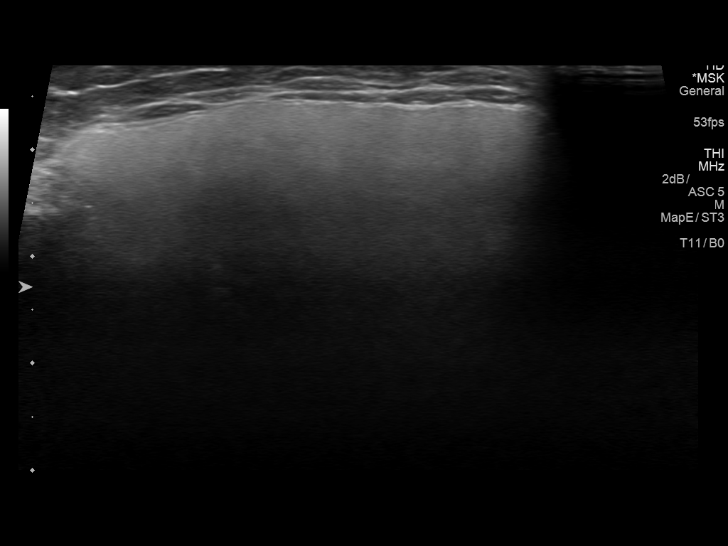
[im 12/36]
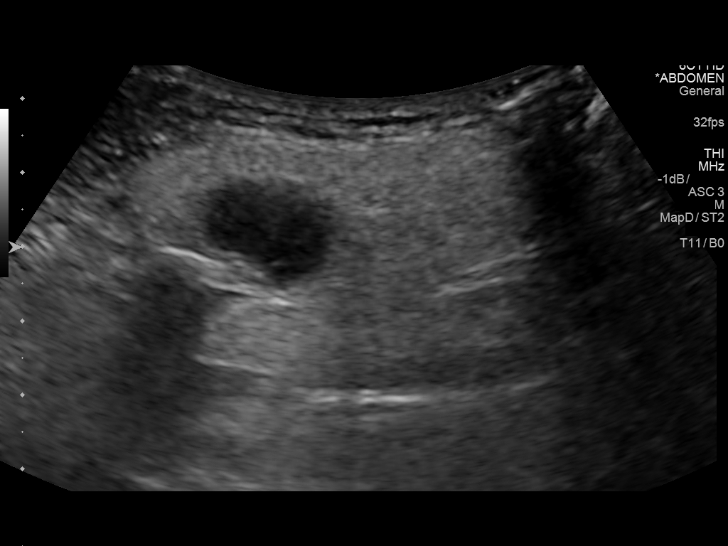
[im 15/36]
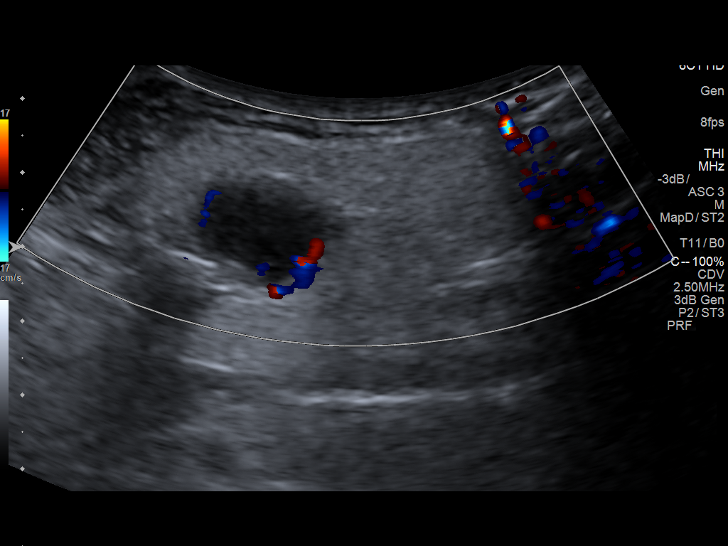
[im 18/36]
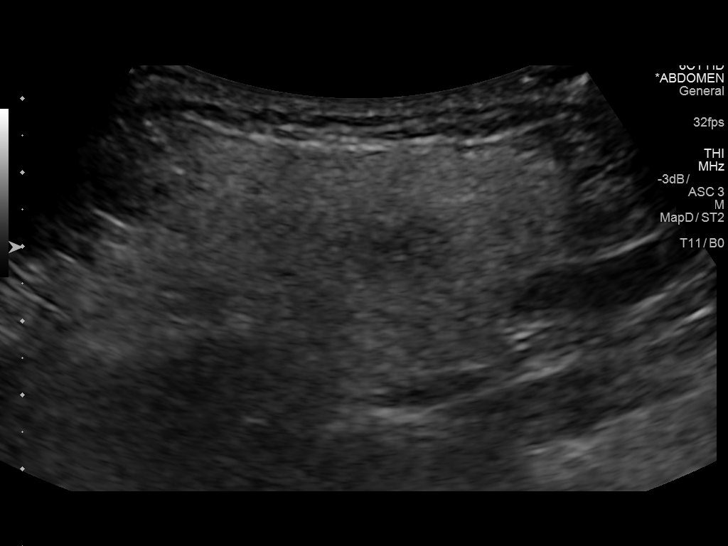
[im 21/36]
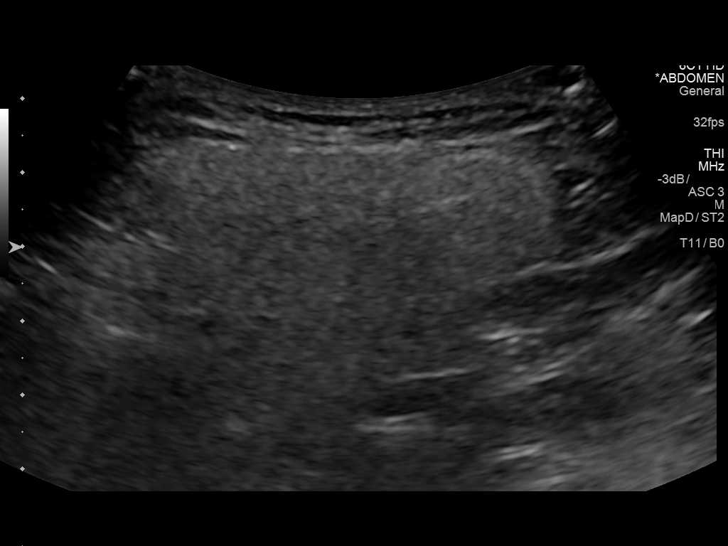
[im 24/36]
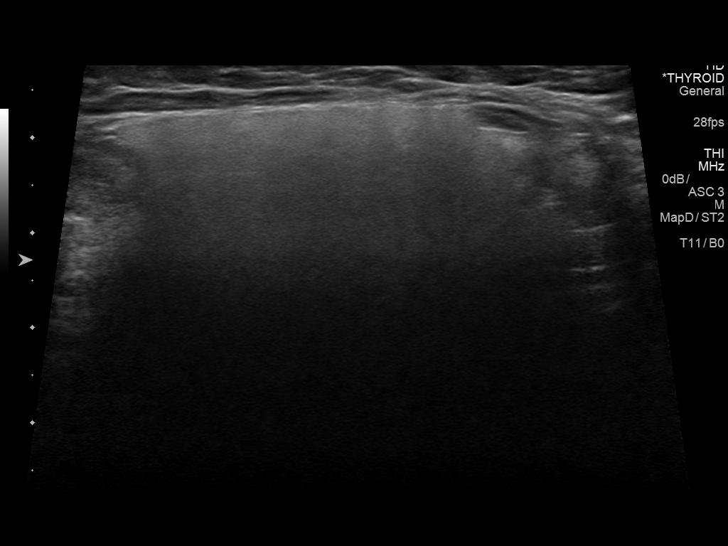
[im 27/36]
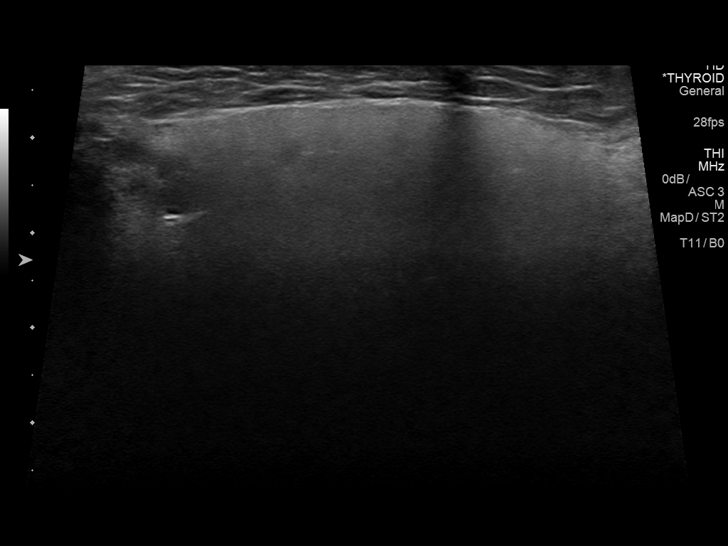
[im 30/36]
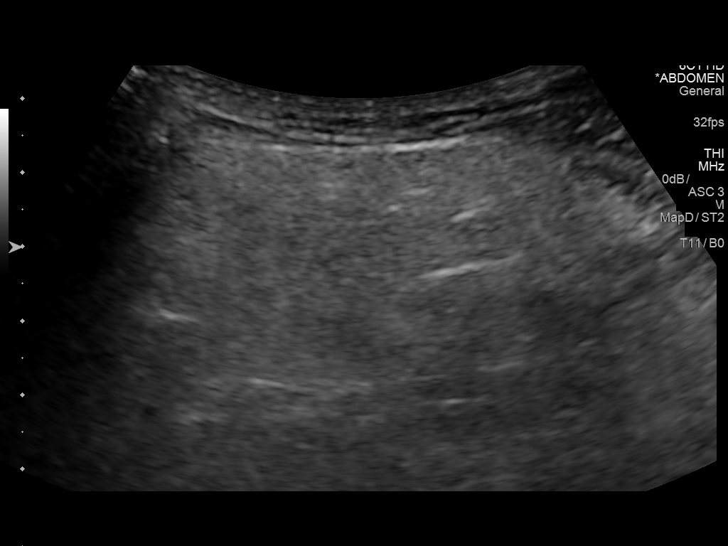
[im 33/36]
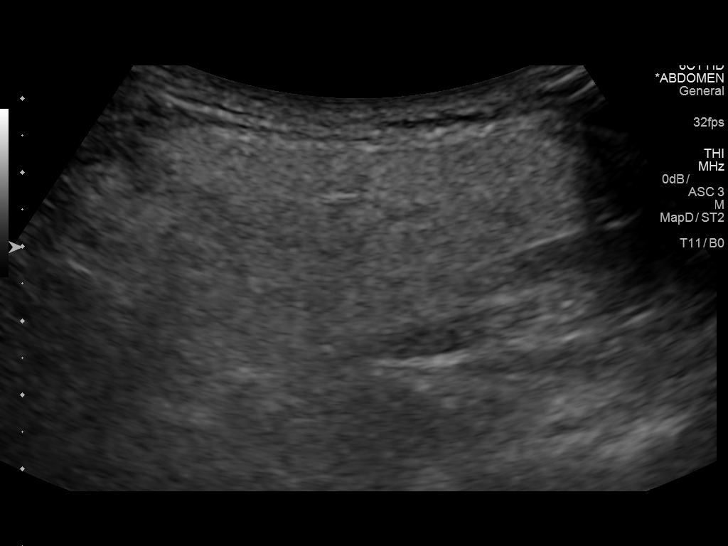
[im 36/36]
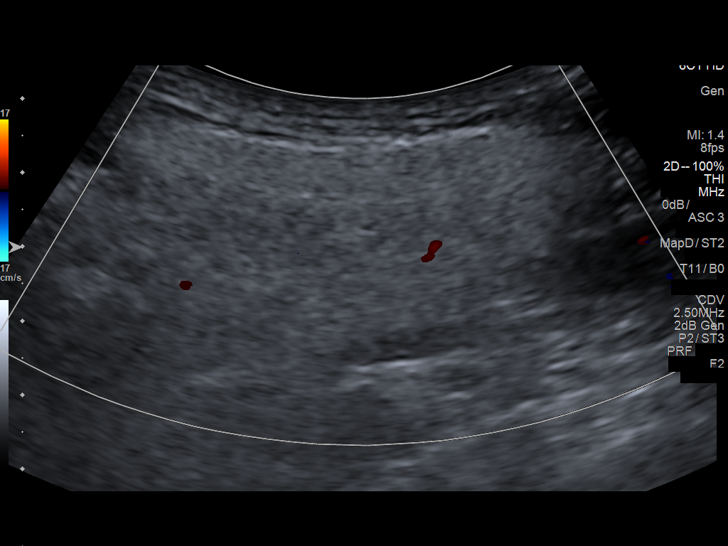

[13 of 25 positions shown; findings below may reference images not displayed]

FINDINGS: Approximately 17 x 16 x 19 mm hypoechoic nodule within the parotid
gland. Color Doppler imaging demonstrates a vascular pedicle and
internal color flow. There is some slight posterior acoustic
enhancement. This region corresponds with the palpable abnormality.
IMPRESSION: The palpable abnormality corresponds with a 1.9 cm complex cystic
nodule which appears to be located within the parotid gland tissue.
There is evidence of vascularity within the structure. Differential
considerations include suppurative lymphadenitis, primary salivary
gland malignancy, metastatic adenopathy, and lymphoma (given the
homogeneous low level internal echoes).

Recommend initial further evaluation with CT scan of the neck with
intravenous contrast material to fully identify the anatomy,
evaluate for additional lymphadenopathy, and for a primary head and
neck lesion. Subsequently, ultrasound-guided biopsy may then be
warranted for tissue diagnosis.

These results will be called to the ordering clinician or
representative by the Radiologist Assistant, and communication
documented in the PACS or zVision Dashboard.

## 2017-06-14 ENCOUNTER — Ambulatory Visit (HOSPITAL_BASED_OUTPATIENT_CLINIC_OR_DEPARTMENT_OTHER)
Admission: RE | Admit: 2017-06-14 | Discharge: 2017-06-14 | Disposition: A | Discharge status: Home / Self Care | Payer: PPO | Source: Ambulatory Visit | Attending: Family | Admitting: Family

## 2017-06-14 ENCOUNTER — Ambulatory Visit (INDEPENDENT_AMBULATORY_CARE_PROVIDER_SITE_OTHER): Payer: PPO | Admitting: Family

## 2017-06-14 ENCOUNTER — Encounter: Payer: Self-pay | Admitting: Family

## 2017-06-14 VITALS — BP 140/84 | HR 67 | Temp 97.8°F | Resp 18 | Ht 66.5 in | Wt 234.6 lb

## 2017-06-14 DIAGNOSIS — R531 Weakness: Secondary | ICD-10-CM

## 2017-06-14 DIAGNOSIS — R2689 Other abnormalities of gait and mobility: Secondary | ICD-10-CM | POA: Diagnosis not present

## 2017-06-14 DIAGNOSIS — I6521 Occlusion and stenosis of right carotid artery: Secondary | ICD-10-CM | POA: Diagnosis not present

## 2017-06-14 DIAGNOSIS — I10 Essential (primary) hypertension: Secondary | ICD-10-CM

## 2017-06-14 DIAGNOSIS — L989 Disorder of the skin and subcutaneous tissue, unspecified: Secondary | ICD-10-CM

## 2017-06-14 DIAGNOSIS — I6523 Occlusion and stenosis of bilateral carotid arteries: Secondary | ICD-10-CM | POA: Diagnosis not present

## 2017-06-14 LAB — COMPREHENSIVE METABOLIC PANEL
ALT: 14 U/L (ref 0–53)
AST: 15 U/L (ref 0–37)
Albumin: 4.1 g/dL (ref 3.5–5.2)
Alkaline Phosphatase: 38 U/L — ABNORMAL LOW (ref 39–117)
BILIRUBIN TOTAL: 0.4 mg/dL (ref 0.2–1.2)
BUN: 22 mg/dL (ref 6–23)
CALCIUM: 9.4 mg/dL (ref 8.4–10.5)
CO2: 32 mEq/L (ref 19–32)
CREATININE: 1.28 mg/dL (ref 0.40–1.50)
Chloride: 104 mEq/L (ref 96–112)
GFR: 57.33 mL/min — AB (ref >60.00)
Glucose, Bld: 91 mg/dL (ref 70–99)
Potassium: 4.4 mEq/L (ref 3.5–5.1)
Sodium: 140 mEq/L (ref 135–145)
TOTAL PROTEIN: 7.3 g/dL (ref 6.0–8.3)

## 2017-06-14 LAB — LIPID PANEL
CHOLESTEROL: 201 mg/dL — AB (ref 0–200)
HDL: 54.3 mg/dL (ref >39.00)
LDL Cholesterol: 126 mg/dL — ABNORMAL HIGH (ref 0–99)
NonHDL: 146.24
TRIGLYCERIDES: 103 mg/dL (ref 0.0–149.0)
Total CHOL/HDL Ratio: 4
VLDL: 20.6 mg/dL (ref 0.0–40.0)

## 2017-06-14 NOTE — Addendum Note (Signed)
Addended by: Debbrah Alar on: 06/14/2017 09:53 AM   Modules accepted: Orders

## 2017-06-14 NOTE — Progress Notes (Addendum)
Subjective:    Patient ID: Glenn Ortega, male    DOB: [birth year], 83 y.o.   MRN: [id number]  HPI  Glenn Ortega Is an 83 year old male who presents today to establish care. Reports that he had parotid gland removal 3 years ago. Had some numbness.  Reports that numbness came back.  Several weeks ago he had an unsteady gait.  Still does not feel that his balance is 100 percent.  But better than it was.  Reports that numbness is returning to baseline on his face.   HTN- He is maintained on losartan,  Ran out 1 week ago.  BP Readings from Last 3 Encounters:  06/14/17 140/84  01/07/17 119/71  05/31/16 138/74   GERD- uses omeprazole prn, reports that this is well controlled.    Chronic low back pain- has hx of spinal stenosis and has had spinal surgery.  Reports that this limits his ability to bend.    Has lesion on right temple that he is concerned about.    Review of Systems  Constitutional: Negative for unexpected weight change.  HENT: Positive for hearing loss. Negative for rhinorrhea.        Has a hearing aid  Eyes: Negative for visual disturbance.  Respiratory: Negative for cough.   Cardiovascular: Negative for leg swelling.  Gastrointestinal: Negative for constipation and diarrhea.  Genitourinary: Negative for dysuria, frequency and hematuria.  Musculoskeletal: Positive for back pain.  Skin: Negative for rash.  Neurological: Negative for headaches.  Hematological: Negative for adenopathy.  Psychiatric/Behavioral:       Denies depression/anxiety   Past Medical History:  Diagnosis Date  . GERD (gastroesophageal reflux disease)   . Hypertension    EKG and chest x ray 8/12 [facility]/states had stress test 2 yrs ago- doesnt remember where- not in [facility]  . Spinal stenosis    lumbar     Social History   Social History  . Marital status: Married    Spouse name: Glenn Ortega  . Number of children: N/A  . Years of education: N/A   Occupational History  . SALESMAN  [employer]    Social History Main Topics  . Smoking status: Never Smoker  . Smokeless tobacco: Never Used     Comment: per patient he never smoke. 11/25/2014 /rc  . Alcohol use 1.2 oz/week    2 Standard drinks or equivalent per week     Comment: socially- occ beer  . Drug use: No  . Sexual activity: Not Currently   Other Topics Concern  . Not on file   Social History Narrative   Works 3 nights a week at [employer].    Previously worked as a Freight forwarder for [employer].   Married to [place]. 2 caffeinated drinks per day. Does not exercise he says secondary to back problems.  Lives in a one story home.     Has 2 children.  Education: some college. He is an is in [facility] from [place]       Past Surgical History:  Procedure Laterality Date  . COLONOSCOPY    . CYSTOSCOPY  12/30/2011   Procedure: CYSTOSCOPY;  Surgeon: Reece Packer, MD;  Location: [facility];  Service: Urology;  Laterality: N/A;  . HERNIA REPAIR  2013  . HERNIA REPAIR Right    age  24  . LUMBAR LAMINECTOMY/DECOMPRESSION MICRODISCECTOMY N/A 11/09/2013   Procedure: Lumbar Laminectomy/Decompression, Microdiscectomy Lumbar Three-Four, Four-Five, with Coflex;  Surgeon: Faythe Ghee, MD;  Location: [facility]  NEURO ORS;  Service: Neurosurgery;  Laterality: N/A;  Lumbar Laminectomy/Decompression, Microdiscectomy Lumbar Three-Four, Four-Five, with Coflex  . PAROTIDECTOMY Left 11/17/2015   Procedure: LEFT PAROTIDECTOMY;  Surgeon: Izora Gala, MD;  Location: [facility];  Service: ENT;  Laterality: Left;  . PROSTATE SURGERY  2013  . TONSILLECTOMY    . trigger thumb  1980   right    Family History  Problem Relation Age of Onset  . Hypertension Unknown   . Cancer Mother        bone?  . Myasthenia gravis Father   . Heart disease Brother     Allergies  Allergen Reactions  . Penicillins Swelling    Current Outpatient Prescriptions on File Prior to Visit  Medication Sig Dispense Refill  . losartan (COZAAR) 25  MG tablet Take 1 tablet by mouth  daily 14 tablet 0  . Multiple Vitamins-Minerals (PRESERVISION AREDS 2 PO) Take by mouth.    Marland Kitchen omeprazole (PRILOSEC) 20 MG capsule TAKE 1 CAPSULE BY MOUTH  DAILY AS NEEDED FOR ACID  REFLUX 90 capsule 3  . [DISCONTINUED] terazosin (HYTRIN) 1 MG capsule take 1 capsule by mouth once daily 30 capsule 0   No current facility-administered medications on file prior to visit.     BP 140/84 (BP Location: Right Arm, Cuff Size: Large)   Pulse 67   Temp 97.8 F (36.6 C) (Oral)   Resp 18   Ht 5' 6.5" (1.689 m)   Wt 234 lb 9.6 oz (106.4 kg)   SpO2 97%   BMI 37.30 kg/m        Objective:   Physical Exam  Constitutional: He is oriented to person, place, and time. He appears well-developed and well-nourished. No distress.  HENT:  Head: Normocephalic and atraumatic.  Neck: Neck supple. No thyromegaly present.  Cardiovascular: Normal rate and regular rhythm.   No murmur heard. Pulmonary/Chest: Effort normal and breath sounds normal. No respiratory distress. He has no wheezes. He has no rales.  Musculoskeletal: He exhibits no edema.  Lymphadenopathy:    He has no cervical adenopathy.  Neurological: He is alert and oriented to person, place, and time. No cranial nerve deficit. He exhibits normal muscle tone. Coordination and gait normal.  Reflex Scores:      Patellar reflexes are 2+ on the right side and 2+ on the left side. Decreased sensation left cheek and left lateral jaw area  Skin: Skin is warm and dry.  Lesion right temple, raised  Psychiatric: He has a normal mood and affect. His behavior is normal. Thought content normal.          Assessment & Plan:  Facial Numbness/balance problem- This is concerning for possible TIA/CVA. 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 This may explain his left-sided facial numbness however it does not explain his recent gait and balance issues and complaints of weakness. Will  obtain CT of the head, carotid duplex, and 2-D echo. Obtain lipid panel to assess lipid status. EKG is performed today.  Notes sinus brady with first degree AV block.  This is unchanged compared to prior EKG.   Hypertension-BP is acceptable today, he admits to using some of his wife's losartan. As he ran out one week ago of his own. Refill sent today. Obtain complete metabolic panel.

## 2017-06-14 NOTE — Patient Instructions (Addendum)
Please complete lab work prior to leaving. Please go to the ER if you develop recurrent numbness/weakness/balance problems.

## 2017-06-15 ENCOUNTER — Telehealth: Payer: Self-pay

## 2017-06-15 ENCOUNTER — Encounter: Payer: Self-pay | Admitting: Family

## 2017-06-15 ENCOUNTER — Other Ambulatory Visit: Payer: Self-pay | Admitting: Family

## 2017-06-15 DIAGNOSIS — G459 Transient cerebral ischemic attack, unspecified: Secondary | ICD-10-CM | POA: Insufficient documentation

## 2017-06-15 DIAGNOSIS — I10 Essential (primary) hypertension: Secondary | ICD-10-CM

## 2017-06-15 MED ORDER — ATORVASTATIN CALCIUM 10 MG PO TABS: 10.0000 mg | ORAL_TABLET | Freq: Every day | ORAL | 1 refills | Status: DC

## 2017-06-15 MED ORDER — ASPIRIN 81 MG PO TABS: 81.0000 mg | ORAL_TABLET | Freq: Every day | ORAL | Status: DC

## 2017-06-15 NOTE — Telephone Encounter (Signed)
Please contact patient and let him know that his CT scan is negative for stroke. This is kidney use. I suspect that his left-sided facial numbness is related to his previous facial nerve injury. Kidney function, liver function, look good. Cholesterol is very mildly elevated. Please work on low-fat low-cholesterol diet.

## 2017-06-15 NOTE — Telephone Encounter (Signed)
Left message for pt to return my call.

## 2017-06-15 NOTE — Telephone Encounter (Signed)
Called Pt to relay the following information   CT scan is negative for stroke. This is kidney use. I suspect that his left-sided facial numbness is related to his previous facial nerve injury. Kidney function, liver function, look good. Cholesterol is very mildly elevated. Please work on low-fat low-cholesterol diet  Pt stated he'd comply and fully understood instructions

## 2017-06-15 NOTE — Telephone Encounter (Signed)
Also, I reviewed his carotid ultrasound. It does not show a blockage but it does show some plaque in his carotid arteries. I would recommend that we add a statin for his cholesterol as well as a low-dose aspirin 81 mg once daily.  It is possible that his balance problems could have been due to a TIA (rather that a larger stroke).  The addition of cholesterol medication and a baby aspirin can help to reduce his risk of future stroke.

## 2017-06-16 ENCOUNTER — Ambulatory Visit (HOSPITAL_BASED_OUTPATIENT_CLINIC_OR_DEPARTMENT_OTHER): Payer: PPO

## 2017-06-18 HISTORY — PX: EYE SURGERY: SHX253

## 2017-06-21 MED ORDER — ATORVASTATIN CALCIUM 10 MG PO TABS: 10.0000 mg | ORAL_TABLET | Freq: Every day | ORAL | 1 refills | Status: DC

## 2017-06-21 NOTE — Telephone Encounter (Addendum)
Notified pt and he is agreeable to medication. States he no longer uses mail order. Only using [facility]. Rx cancelled per Manson Passey. At [facility] and resent to [facility].

## 2017-06-21 NOTE — Addendum Note (Signed)
Addended by: Kelle Darting A on: 06/21/2017 12:09 PM   Modules accepted: Orders

## 2017-06-23 ENCOUNTER — Telehealth: Payer: Self-pay | Admitting: Family

## 2017-06-23 ENCOUNTER — Other Ambulatory Visit (HOSPITAL_BASED_OUTPATIENT_CLINIC_OR_DEPARTMENT_OTHER): Payer: PPO

## 2017-06-23 NOTE — Telephone Encounter (Signed)
Pt called states he is having cateract sx at [facility] 07/11/17. Pt request we fax EKG, ultrasound results and any additional testing he had done here to the [place] prior to sx. Fax [phone] Attn: Shaune Spittle PAC. Pt ph# [phone].

## 2017-06-24 NOTE — Telephone Encounter (Signed)
Recent labs, EKG, u/s and CT faxed to below #. Notified pt.

## 2017-08-03 ENCOUNTER — Telehealth: Payer: Self-pay | Admitting: Family

## 2017-08-03 DIAGNOSIS — L989 Disorder of the skin and subcutaneous tissue, unspecified: Secondary | ICD-10-CM

## 2017-08-03 NOTE — Telephone Encounter (Signed)
Went to [facility] appt today but he did not have $20.00 he is waiting for his social security check to come. He wants referred ASAP to new dermatologist. He does not want to wait another month for an ppt. Pt states his wife had seen that same dermatologist before and did not like them either. please call pt [phone].

## 2017-08-03 NOTE — Telephone Encounter (Signed)
Spoke with pt and advised him that PCP may give referral to different group but we cannot guarantee any earlier of an appt. Pt voices understanding. States he asked current dermatologist if they would see him today and bill the copay but states they would not work with him.  Please advise?

## 2017-08-03 NOTE — Telephone Encounter (Signed)
Referral made to [facility] dermatology.

## 2017-08-04 ENCOUNTER — Encounter: Payer: Self-pay | Admitting: Family

## 2017-08-05 NOTE — Telephone Encounter (Signed)
Notified pt and he voices understanding. 

## 2017-08-22 ENCOUNTER — Telehealth: Payer: Self-pay | Admitting: *Deleted

## 2017-08-22 NOTE — Telephone Encounter (Signed)
Received request for last labs, printed and forwarded to Sender/SLS 11/05

## 2017-09-05 ENCOUNTER — Ambulatory Visit (INDEPENDENT_AMBULATORY_CARE_PROVIDER_SITE_OTHER): Payer: PPO | Admitting: Family Medicine

## 2017-09-05 ENCOUNTER — Encounter: Payer: Self-pay | Admitting: Family Medicine

## 2017-09-05 VITALS — BP 121/65 | HR 68 | Temp 97.8°F | Resp 16 | Ht 66.5 in | Wt 233.1 lb

## 2017-09-05 DIAGNOSIS — G5601 Carpal tunnel syndrome, right upper limb: Secondary | ICD-10-CM | POA: Diagnosis not present

## 2017-09-05 NOTE — Patient Instructions (Signed)
Thank you for coming in today. Call or go to the ER if you develop a large red swollen joint with extreme pain or oozing puss.  Return sooner if needed.  Take it easy for a few days.

## 2017-09-05 NOTE — Progress Notes (Signed)
Glenn Ortega is a 83 y.o. male who presents to [facility] today for carpal tunnel syndrome.  Glenn Ortega has a long history of carpal tunnel syndrome primarily in his right hand.  I saw him last for this in March 2018 where he had a ultrasound-guided median nerve hydrodissection at the carpal tunnel.  He was asymptomatic until recently.  He notes tingling and pain and sometimes numbness into his thumb index and middle finger of his right hand.  Symptoms are consistent with prior episodes of carpal tunnel.  He has started trying to use his brace again which has not helped.  Denies any new injury.   Past Medical History:  Diagnosis Date  . GERD (gastroesophageal reflux disease)   . Hypertension    EKG and chest x ray 8/12 [facility]/states had stress test 2 yrs ago- doesnt remember where- not in [facility]  . Spinal stenosis    lumbar   Past Surgical History:  Procedure Laterality Date  . COLONOSCOPY    . CYSTOSCOPY N/A 12/30/2011   Performed by Reece Packer, MD at [facility] ORS  . HERNIA REPAIR  2013  . HERNIA REPAIR Right    age  24  . LEFT PAROTIDECTOMY Left 11/17/2015   Performed by Izora Gala, MD at [facility]  . Lumbar Laminectomy/Decompression, Microdiscectomy Lumbar Three-Four, Four-Five, with Coflex N/A 11/09/2013   Performed by Hal Neer Olga Coaster, MD at [facility] NEURO ORS  . PROSTATE SURGERY  2013  . TONSILLECTOMY    . trigger thumb  1980   right   Social History   Tobacco Use  . Smoking status: Never Smoker  . Smokeless tobacco: Never Used  . Tobacco comment: per patient he never smoke. 11/25/2014 /rc  Substance Use Topics  . Alcohol use: Yes    Alcohol/week: 1.2 oz    Types: 2 Standard drinks or equivalent per week    Comment: socially- occ beer     ROS:  As above   Medications: Current Outpatient Medications  Medication Sig Dispense Refill  . aspirin 81 MG tablet Take 1 tablet (81 mg total) by mouth daily. 30 tablet     . atorvastatin (LIPITOR) 10 MG tablet Take 1 tablet (10 mg total) by mouth daily. 90 tablet 1  . losartan (COZAAR) 25 MG tablet Take 1 tablet by mouth  daily 14 tablet 0  . Multiple Vitamins-Minerals (PRESERVISION AREDS 2 PO) Take by mouth.     No current facility-administered medications for this visit.    Allergies  Allergen Reactions  . Penicillins Swelling     Exam:  BP 121/65   Pulse 68   Temp 97.8 F (36.6 C) (Oral)   Resp 16   Ht 5' 6.5" (1.689 m)   Wt 233 lb 1.9 oz (105.7 kg)   SpO2 95%   BMI 37.06 kg/m  General: Well Developed, well nourished, and in no acute distress.  Neuro/Psych: Alert and oriented x3, extra-ocular muscles intact, able to move all 4 extremities, sensation grossly intact. Skin: Warm and dry, no rashes noted.  Respiratory: Not using accessory muscles, speaking in full sentences, trachea midline.  Cardiovascular: Pulses palpable, no extremity edema. Abdomen: Does not appear distended. MSK:  Right hand normal-appearing with no thenar atrophy.  Positive Tinel's. Right shoulder normal motion. C-spine normal motion negative Spurling's test no radicular symptoms with neck range of motion   Procedure: Real-time Ultrasound Guided Injection of right wrist median nerve hydrodissection and carpal tunnel Device:  GE Logiq E  Images permanently stored and available for review in the ultrasound unit. Verbal informed consent obtained. Discussed risks and benefits of procedure. Warned about infection bleeding damage to structures skin hypopigmentation and fat atrophy among others. Patient expresses understanding and agreement Time-out conducted.  Noted no overlying erythema, induration, or other signs of local infection.  Skin prepped in a sterile fashion.  Local anesthesia: Topical Ethyl chloride.  With sterile technique and under real time ultrasound guidance: A 25-gauge needle was advanced under ultrasound guidance to the area superficial to the  median nerve. . A total of 2 mL of lidocaine and 40 mg of Kenalog were injected around the median nerve. Completed without difficulty  Patient tolerated the procedure well.  Advised to call if fevers/chills, erythema, induration, drainage, or persistent bleeding.  Images permanently stored and available for review in the ultrasound unit.  Impression: Technically successful ultrasound guided injection.    No results found for this or any previous visit (from the past 48 hour(s)). No results found.    Assessment and Plan: 83 y.o. male with right carpal tunnel syndrome status post injection/Hydro dissection.  Recommend relative rest and recheck as needed.  Resume use of night splint.    No orders of the defined types were placed in this encounter.  No orders of the defined types were placed in this encounter.   Discussed warning signs or symptoms. Please see discharge instructions. Patient expresses understanding.

## 2017-09-07 ENCOUNTER — Emergency Department (HOSPITAL_COMMUNITY)
Admission: EM | Admit: 2017-09-07 | Discharge: 2017-09-07 | Disposition: A | Discharge status: Home / Self Care | Payer: PPO | Attending: Emergency Medicine | Admitting: Emergency Medicine

## 2017-09-07 ENCOUNTER — Encounter (HOSPITAL_COMMUNITY): Payer: Self-pay | Admitting: Emergency Medicine

## 2017-09-07 DIAGNOSIS — Z8673 Personal history of transient ischemic attack (TIA), and cerebral infarction without residual deficits: Secondary | ICD-10-CM | POA: Insufficient documentation

## 2017-09-07 DIAGNOSIS — M25511 Pain in right shoulder: Secondary | ICD-10-CM | POA: Diagnosis present

## 2017-09-07 DIAGNOSIS — Z79899 Other long term (current) drug therapy: Secondary | ICD-10-CM | POA: Insufficient documentation

## 2017-09-07 DIAGNOSIS — I10 Essential (primary) hypertension: Secondary | ICD-10-CM | POA: Diagnosis not present

## 2017-09-07 DIAGNOSIS — M7551 Bursitis of right shoulder: Secondary | ICD-10-CM | POA: Insufficient documentation

## 2017-09-07 DIAGNOSIS — Z7982 Long term (current) use of aspirin: Secondary | ICD-10-CM | POA: Diagnosis not present

## 2017-09-07 MED ORDER — TRAMADOL HCL 50 MG PO TABS: 50.0000 mg | ORAL_TABLET | Freq: Four times a day (QID) | ORAL | 0 refills | Status: DC | PRN

## 2017-09-07 MED ORDER — NAPROXEN 375 MG PO TABS: 375.0000 mg | ORAL_TABLET | Freq: Two times a day (BID) | ORAL | 0 refills | Status: DC

## 2017-09-07 NOTE — ED Notes (Signed)
ED Provider at bedside. 

## 2017-09-07 NOTE — ED Triage Notes (Addendum)
Patient c/o intermittent right shoulder pain that shoots down right arm for several days esp at night when patient lying down trying to sleep. Patient takes tramadol at night to help with sleep and pain is relieved.  Patient reports lifting things but "not over my head".

## 2017-09-07 NOTE — ED Provider Notes (Signed)
[facility] DEPT Provider Note   CSN: [id number] Arrival date & time: 09/07/17  1040     History   Chief Complaint Chief Complaint  Patient presents with  . Shoulder Pain    HPI Glenn Ortega is a 83 y.o. male.  HPI Patient reports he has had pain on the top of his right shoulder for over a week.  He was seen by his sports medicine doctor 3 days ago and had injection for carpal tunnel.  He reports the carpal tunnel is been a chronic intermittent problem.  He reports that the shoulder pain is different.  It preceded his visit to the sports medicine doctor.  Worst at night is aching and throbbing on the top of the shoulder and radiates a little bit down his arm sometimes. Past Medical History:  Diagnosis Date  . GERD (gastroesophageal reflux disease)   . Hypertension    EKG and chest x ray 8/12 [facility]/states had stress test 2 yrs ago- doesnt remember where- not in [facility]  . Spinal stenosis    lumbar    Patient Active Problem List   Diagnosis Date Noted  . TIA (transient ischemic attack)   . Carpal tunnel syndrome on left 05/31/2016  . Right shoulder injury 05/03/2016  . 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 Parotid mass 11/17/2015  . Carpal tunnel syndrome, right 08/18/2015  . Unspecified hereditary and idiopathic peripheral neuropathy 06/18/2014  . Lumbar spinal stenosis 11/09/2013  . ESOPHAGEAL REFLUX 04/10/2010  . BURSITIS, LEFT SHOULDER 04/10/2010  . VASOVAGAL SYNCOPE 04/10/2010  . HYPERTENSION, PULMONARY 04/07/2010  . SEBORRHEIC KERATOSIS 10/07/2009  . UNSPECIFIED HEARING LOSS 08/07/2008  . HYPERTENSION, BENIGN 08/07/2008  . HYPERTROPHY PROSTATE W/O UR OBST & OTH LUTS 08/07/2008  . SCIATICA 08/07/2008    Past Surgical History:  Procedure Laterality Date  . COLONOSCOPY    . CYSTOSCOPY  12/30/2011   Procedure: CYSTOSCOPY;  Surgeon: Reece Packer, MD;  Location: [facility];  Service:  Urology;  Laterality: N/A;  . HERNIA REPAIR  2013  . HERNIA REPAIR Right    age  6  . LUMBAR LAMINECTOMY/DECOMPRESSION MICRODISCECTOMY N/A 11/09/2013   Procedure: Lumbar Laminectomy/Decompression, Microdiscectomy Lumbar Three-Four, Four-Five, with Coflex;  Surgeon: Faythe Ghee, MD;  Location: [facility] NEURO ORS;  Service: Neurosurgery;  Laterality: N/A;  Lumbar Laminectomy/Decompression, Microdiscectomy Lumbar Three-Four, Four-Five, with Coflex  . PAROTIDECTOMY Left 11/17/2015   Procedure: LEFT PAROTIDECTOMY;  Surgeon: Izora Gala, MD;  Location: [facility];  Service: ENT;  Laterality: Left;  . PROSTATE SURGERY  2013  . TONSILLECTOMY    . trigger thumb  1980   right       Home Medications    Prior to Admission medications   Medication Sig Start Date End Date Taking? Authorizing Provider  aspirin 325 MG tablet Take 650 mg by mouth daily as needed.   Yes [provider]  aspirin 81 MG tablet Take 1 tablet (81 mg total) by mouth daily. 06/15/17  Yes Debbrah Alar, NP  atorvastatin (LIPITOR) 10 MG tablet Take 1 tablet (10 mg total) by mouth daily. 06/21/17  Yes Debbrah Alar, NP  losartan (COZAAR) 25 MG tablet Take 1 tablet by mouth  daily 01/07/17  Yes Hali Marry, MD  Multiple Vitamins-Minerals (PRESERVISION AREDS 2 PO) Take by mouth.   Yes [provider]  naproxen (NAPROSYN) 375 MG tablet Take 1 tablet (375 mg total) by mouth 2 (  two) times daily. 09/07/17   Charlesetta Shanks, MD  traMADol (ULTRAM) 50 MG tablet Take 1 tablet (50 mg total) by mouth every 6 (six) hours as needed. 09/07/17   Charlesetta Shanks, MD    Family History Family History  Problem Relation Age of Onset  . Hypertension Unknown   . Cancer Mother        bone?  . Myasthenia gravis Father   . Heart disease Brother     Social History Social History   Tobacco Use  . Smoking status: Never Smoker  . Smokeless tobacco: Never Used  . Tobacco comment: per patient he  never smoke. 11/25/2014 /rc  Substance Use Topics  . Alcohol use: Yes    Alcohol/week: 1.2 oz    Types: 2 Standard drinks or equivalent per week    Comment: socially- occ beer  . Drug use: No     Allergies   Penicillins   Review of Systems Review of Systems 10 Systems reviewed and are negative for acute change except as noted in the HPI.   Physical Exam Updated Vital Signs BP (!) 148/81 (BP Location: Right Arm)   Pulse 68   Temp (!) 97.5 F (36.4 C) (Oral)   Resp 16   Ht 5\' 8"  (1.727 m)   Wt 105.7 kg (233 lb)   SpO2 98%   BMI 35.43 kg/m   Physical Exam  Constitutional: He is oriented to person, place, and time. He appears well-developed and well-nourished. No distress.  HENT:  Head: Normocephalic and atraumatic.  Eyes: EOM are normal.  Cardiovascular: Normal rate and regular rhythm.  Pulmonary/Chest: Effort normal and breath sounds normal.  Musculoskeletal:  Reproducible local pain over the point of the right shoulder.  Patient has intact range of motion without severe pain.  He has intact strength testing for deltoid biceps and triceps.  Normal neurovascular exam.  Neurological: He is alert and oriented to person, place, and time. He exhibits normal muscle tone. Coordination normal.  Skin: Skin is warm and dry.  Psychiatric: He has a normal mood and affect.     ED Treatments / Results  Labs (all labs ordered are listed, but only abnormal results are displayed) Labs Reviewed - No data to display  EKG  EKG Interpretation None       Radiology No results found.  Procedures Procedures (including critical care time)  Medications Ordered in ED Medications - No data to display   Initial Impression / Assessment and Plan / ED Course  I have reviewed the triage vital signs and the nursing notes.  Pertinent labs & imaging results that were available during my care of the patient were reviewed by me and considered in my medical decision making (see chart  for details).     Final Clinical Impressions(s) / ED Diagnoses   Final diagnoses:  Bursitis of right shoulder   Patient has shoulder pain which is muscular skeletal in nature.  It is localizing and reproducible to palpation.  Most suggestive of bursitis given reasonable range of motion without severe pain.  Anti-inflammatory and tramadol recommended and follow-up with sports medicine. ED Discharge Orders        Ordered    naproxen (NAPROSYN) 375 MG tablet  2 times daily     09/07/17 1215    traMADol (ULTRAM) 50 MG tablet  Every 6 hours PRN     09/07/17 1215       Charlesetta Shanks, MD 09/13/17 [phone]

## 2017-09-14 ENCOUNTER — Encounter: Payer: Self-pay | Admitting: Family

## 2017-09-14 ENCOUNTER — Ambulatory Visit (INDEPENDENT_AMBULATORY_CARE_PROVIDER_SITE_OTHER): Payer: PPO | Admitting: Family

## 2017-09-14 VITALS — BP 130/62 | HR 57 | Temp 97.6°F | Resp 16 | Ht 68.0 in | Wt 230.0 lb

## 2017-09-14 DIAGNOSIS — E785 Hyperlipidemia, unspecified: Secondary | ICD-10-CM

## 2017-09-14 DIAGNOSIS — M25511 Pain in right shoulder: Secondary | ICD-10-CM

## 2017-09-14 DIAGNOSIS — I10 Essential (primary) hypertension: Secondary | ICD-10-CM

## 2017-09-14 DIAGNOSIS — R490 Dysphonia: Secondary | ICD-10-CM

## 2017-09-14 LAB — LIPID PANEL
CHOLESTEROL: 167 mg/dL (ref 0–200)
HDL: 65.2 mg/dL (ref >39.00)
LDL Cholesterol: 79 mg/dL (ref 0–99)
NONHDL: 101.7
TRIGLYCERIDES: 116 mg/dL (ref 0.0–149.0)
Total CHOL/HDL Ratio: 3
VLDL: 23.2 mg/dL (ref 0.0–40.0)

## 2017-09-14 LAB — BASIC METABOLIC PANEL
BUN: 30 mg/dL — ABNORMAL HIGH (ref 6–23)
CALCIUM: 9.3 mg/dL (ref 8.4–10.5)
CO2: 26 mEq/L (ref 19–32)
Chloride: 104 mEq/L (ref 96–112)
Creatinine, Ser: 1.16 mg/dL (ref 0.40–1.50)
GFR: 64.19 mL/min (ref >60.00)
GLUCOSE: 90 mg/dL (ref 70–99)
POTASSIUM: 4.3 meq/L (ref 3.5–5.1)
SODIUM: 138 meq/L (ref 135–145)

## 2017-09-14 MED ORDER — OMEPRAZOLE 20 MG PO CPDR: 20.0000 mg | DELAYED_RELEASE_CAPSULE | Freq: Every day | ORAL | 3 refills | Status: DC

## 2017-09-14 NOTE — Patient Instructions (Signed)
Please complete lab work prior to leaving. Restart omeprazole for your voice hoarseness.  Please let me know if hoarseness is not improved in 3-4 weeks. Schedule follow up with Dr. Georgina Snell for your shoulder pain. Complete lab work prior to leaving.

## 2017-09-14 NOTE — Progress Notes (Signed)
Subjective:    Patient ID: Glenn Ortega, male    DOB: [birth year], 83 y.o.   MRN: [id number]  HPI  Mr. Limes is an 83 yr old male who presents today for follow up.   Hypertension- maintained on losartan 25mg .  Denies CP/SOB or swelling.    BP Readings from Last 3 Encounters:  09/14/17 130/62  09/07/17 (!) 148/81  09/05/17 121/65   Hyperlipidemia- maintained on lipitor 10mg .  Lab Results  Component Value Date   CHOL 201 (H) 06/14/2017   HDL 54.30 06/14/2017   LDLCALC 126 (H) 06/14/2017   TRIG 103.0 06/14/2017   CHOLHDL 4 06/14/2017   Report some voice hoarsness.  Stopped omeprazole "a while back." Denies overt gerd symptoms.   Right shoulder pain- was seen in the ED. Diagnosed with bursitis, given rx for naproxen. Repors that pain is some better but still hurts to reach for things with the right shoulder.    Review of Systems See HPI  Past Medical History:  Diagnosis Date  . GERD (gastroesophageal reflux disease)   . Hypertension    EKG and chest x ray 8/12 EPIC/states had stress test 2 yrs ago- doesnt remember where- not in EPIC  . Spinal stenosis    lumbar     Social History   Socioeconomic History  . Marital status: Married    Spouse name: [name]  . Number of children: Not on file  . Years of education: Not on file  . Highest education level: Not on file  Social Needs  . Financial resource strain: Not on file  . Food insecurity - worry: Not on file  . Food insecurity - inability: Not on file  . Transportation needs - medical: Not on file  . Transportation needs - non-medical: Not on file  Occupational History  . Occupation: Estate agent: [employer]  Tobacco Use  . Smoking status: Never Smoker  . Smokeless tobacco: Never Used  . Tobacco comment: per patient he never smoke. 11/25/2014 /rc  Substance and Sexual Activity  . Alcohol use: Yes    Alcohol/week: 1.2 oz    Types: 2 Standard drinks or equivalent per week    Comment: socially- occ  beer  . Drug use: No  . Sexual activity: Not Currently  Other Topics Concern  . Not on file  Social History Narrative   Works 3 nights a week at [employer].    Previously worked as a Freight forwarder for [employer].   Married to [place]. 2 caffeinated drinks per day. Does not exercise he says secondary to back problems.  Lives in a one story home.     Has 2 children.  Education: some college. He is an is in [facility] from [place]    Past Surgical History:  Procedure Laterality Date  . COLONOSCOPY    . CYSTOSCOPY  12/30/2011   Procedure: CYSTOSCOPY;  Surgeon: Reece Packer, MD;  Location: [facility];  Service: Urology;  Laterality: N/A;  . HERNIA REPAIR  2013  . HERNIA REPAIR Right    age  20  . LUMBAR LAMINECTOMY/DECOMPRESSION MICRODISCECTOMY N/A 11/09/2013   Procedure: Lumbar Laminectomy/Decompression, Microdiscectomy Lumbar Three-Four, Four-Five, with Coflex;  Surgeon: Faythe Ghee, MD;  Location: [facility];  Service: Neurosurgery;  Laterality: N/A;  Lumbar Laminectomy/Decompression, Microdiscectomy Lumbar Three-Four, Four-Five, with Coflex  . PAROTIDECTOMY Left 11/17/2015   Procedure: LEFT PAROTIDECTOMY;  Surgeon: Izora Gala, MD;  Location: [facility];  Service: ENT;  Laterality: Left;  . PROSTATE SURGERY  2013  . TONSILLECTOMY    . trigger thumb  1980   right    Family History  Problem Relation Age of Onset  . Hypertension Unknown   . Cancer Mother        bone?  . Myasthenia gravis Father   . Heart disease Brother     Allergies  Allergen Reactions  . Penicillins Swelling    CHILDHOOD ALLERGY Has patient had a PCN reaction causing immediate rash, facial/tongue/throat swelling, SOB or lightheadedness with hypotension: Yes Has patient had a PCN reaction causing severe rash involving mucus membranes or skin necrosis: No Has patient had a PCN reaction that required hospitalization: No Has patient had a PCN reaction occurring within the last 10  years: No If all of the above answers are "NO", then may proceed with Cephalosporin use.     Current Outpatient Medications on File Prior to Visit  Medication Sig Dispense Refill  . aspirin 325 MG tablet Take 650 mg by mouth daily as needed.    Marland Kitchen aspirin 81 MG tablet Take 1 tablet (81 mg total) by mouth daily. 30 tablet   . atorvastatin (LIPITOR) 10 MG tablet Take 1 tablet (10 mg total) by mouth daily. 90 tablet 1  . losartan (COZAAR) 25 MG tablet Take 1 tablet by mouth  daily 14 tablet 0  . Multiple Vitamins-Minerals (PRESERVISION AREDS 2 PO) Take by mouth.    . naproxen (NAPROSYN) 375 MG tablet Take 1 tablet (375 mg total) by mouth 2 (two) times daily. 20 tablet 0  . [DISCONTINUED] terazosin (HYTRIN) 1 MG capsule take 1 capsule by mouth once daily 30 capsule 0   No current facility-administered medications on file prior to visit.     BP 130/62 (BP Location: Right Arm, Patient Position: Sitting, Cuff Size: Small)   Pulse (!) 57   Temp 97.6 F (36.4 C) (Oral)   Resp 16   Ht 5\' 8"  (1.727 m)   Wt 230 lb (104.3 kg)   SpO2 95%   BMI 34.97 kg/m        Objective:   Physical Exam  Constitutional: He is oriented to person, place, and time. He appears well-developed and well-nourished. No distress.  HENT:  Head: Normocephalic and atraumatic.  Cardiovascular: Normal rate and regular rhythm.  No murmur heard. Pulmonary/Chest: Effort normal and breath sounds normal. No respiratory distress. He has no wheezes. He has no rales.  Musculoskeletal: He exhibits no edema.  Right shoulder without tenderness or swelling, no pain with passive ROM  Neurological: He is alert and oriented to person, place, and time.  Skin: Skin is warm and dry.  Psychiatric: He has a normal mood and affect. His behavior is normal. Thought content normal.          Assessment & Plan:  HTN- BP is stable, continue current meds. Obtain follow up bmet  R shoulder pain- advised pt to schedule follow up with his  sports medicine doctor.  Hyperlipidemia- obtain follow up lipid panel.  Continue statin.   Voice hoarseness- suspect gerd related. Restart PPI. He is advised to let me know if symptoms fail to improve in 3-4 weeks. Would consider referral to ENT at that time.  Reports flu shot up to date. Needs Wellness visit.

## 2017-09-15 ENCOUNTER — Encounter: Payer: Self-pay | Admitting: Family

## 2017-10-13 ENCOUNTER — Encounter: Payer: Self-pay | Admitting: *Deleted

## 2017-10-13 ENCOUNTER — Ambulatory Visit: Payer: PPO | Admitting: *Deleted

## 2017-10-13 ENCOUNTER — Ambulatory Visit (INDEPENDENT_AMBULATORY_CARE_PROVIDER_SITE_OTHER): Payer: PPO | Admitting: *Deleted

## 2017-10-13 VITALS — BP 136/70 | HR 66 | Ht 68.0 in | Wt 231.2 lb

## 2017-10-13 DIAGNOSIS — Z0000 Encounter for general adult medical examination without abnormal findings: Secondary | ICD-10-CM

## 2017-10-13 NOTE — Progress Notes (Signed)
I have reviewed MWE note by Ms. Britt and agree with her documentation- J Hollin Crewe MD  

## 2017-10-13 NOTE — Progress Notes (Signed)
Subjective:   Glenn Ortega is a 83 y.o. male who presents for an Initial Medicare Annual Wellness Visit.  Review of Systems No ROS.  Medicare Wellness Visit. Additional risk factors are reflected in the social history.  Cardiac Risk Factors include: advanced age (>36men, >30 women);hypertension;male gender;obesity (BMI >30kg/m2) Sleep patterns: Sleeps 7-8 hrs. Feels rested.  Home Safety/Smoke Alarms: Feels safe in home. Smoke alarms in place. Lives with wife Glenn Ortega in 1 story home.    Male:   CCS- No longer doing routine screening due to age.     PSA-  Lab Results  Component Value Date   PSA 3.80 04/30/2010   PSA 3.66 08/08/2008      Objective:    Today's Vitals   10/13/17 0947  BP: 136/70  Pulse: 66  SpO2: 96%  Weight: 231 lb 3.2 oz (104.9 kg)  Height: 5\' 8"  (1.727 m)   Body mass index is 35.15 kg/m.  Advanced Directives 10/13/2017 09/07/2017 11/17/2015 11/13/2015 11/06/2014 04/08/2014 11/02/2013  Does Patient Have a Medical Advance Directive? No No No No No Patient does not have advance directive;Patient would not like information Patient does not have advance directive;Patient would not like information  Would patient like information on creating a medical advance directive? Yes (MAU/Ambulatory/Procedural Areas - Information given) - No - patient declined information - No - patient declined information - -  Pre-existing out of facility DNR order (yellow form or pink MOST form) - - - - - - No    Current Medications (verified) Outpatient Encounter Medications as of 10/13/2017  Medication Sig  . aspirin 81 MG tablet Take 1 tablet (81 mg total) by mouth daily.  Marland Kitchen atorvastatin (LIPITOR) 10 MG tablet Take 1 tablet (10 mg total) by mouth daily.  Marland Kitchen losartan (COZAAR) 25 MG tablet Take 1 tablet by mouth  daily  . Multiple Vitamins-Minerals (PRESERVISION AREDS 2 PO) Take by mouth.  Marland Kitchen omeprazole (PRILOSEC) 20 MG capsule Take 1 capsule (20 mg total) by mouth daily.  . naproxen  (NAPROSYN) 375 MG tablet Take 1 tablet (375 mg total) by mouth 2 (two) times daily. (Patient not taking: Reported on 10/13/2017)  . [DISCONTINUED] aspirin 325 MG tablet Take 650 mg by mouth daily as needed.   No facility-administered encounter medications on file as of 10/13/2017.     Allergies (verified) Penicillins   History: Past Medical History:  Diagnosis Date  . GERD (gastroesophageal reflux disease)   . Hypertension    EKG and chest x ray 8/12 EPIC/states had stress test 2 yrs ago- doesnt remember where- not in EPIC  . Spinal stenosis    lumbar   Past Surgical History:  Procedure Laterality Date  . COLONOSCOPY    . CYSTOSCOPY  12/30/2011   Procedure: CYSTOSCOPY;  Surgeon: Reece Packer, MD;  [facility];  Service: Urology;  Laterality: N/A;  . EYE SURGERY Bilateral 06/18/2017   cataract sx with lens implant  . HERNIA REPAIR  2013  . HERNIA REPAIR Right    age  58  . LUMBAR LAMINECTOMY/DECOMPRESSION MICRODISCECTOMY N/A 11/09/2013   Procedure: Lumbar Laminectomy/Decompression, Microdiscectomy Lumbar Three-Four, Four-Five, with Coflex;  Surgeon: Faythe Ghee, MD;  Location[facility];  Service: Neurosurgery;  Laterality: N/A;  Lumbar Laminectomy/Decompression, Microdiscectomy Lumbar Three-Four, Four-Five, with Coflex  . PAROTIDECTOMY Left 11/17/2015   Procedure: LEFT PAROTIDECTOMY;  Surgeon: Izora Gala, MD;  Location: [facility];  Service: ENT;  Laterality: Left;  . PROSTATE SURGERY  2013  .  TONSILLECTOMY    . trigger thumb  1980   right   Family History  Problem Relation Age of Onset  . Hypertension Unknown   . Cancer Mother        bone?  . Myasthenia gravis Father   . Heart disease Brother    Social History   Socioeconomic History  . Marital status: Married    Spouse name: Glenn Ortega  . Number of children: None  . Years of education: None  . Highest education level: None  Social Needs  . Financial resource strain: None  . Food  insecurity - worry: None  . Food insecurity - inability: None  . Transportation needs - medical: None  . Transportation needs - non-medical: None  Occupational History  . Occupation: Estate agent: [employer]  Tobacco Use  . Smoking status: Never Smoker  . Smokeless tobacco: Never Used  . Tobacco comment: per patient he never smoke. 11/25/2014 /rc  Substance and Sexual Activity  . Alcohol use: Yes    Alcohol/week: 1.2 oz    Types: 2 Standard drinks or equivalent per week    Comment: socially- occ beer  . Drug use: No  . Sexual activity: Not Currently  Other Topics Concern  . None  Social History Narrative   Works 3 nights a week at [employer].    Previously worked as a Freight forwarder for [employer].   Married to [place]. 2 caffeinated drinks per day. Does not exercise he says secondary to back problems.  Lives in a one story home.     Has 2 children.  Education: some college. He is an is in [facility] from [place] given: Not Answered Comment: per patient he never smoke. 11/25/2014 /rc   Clinical Intake: Pain : No/denies pain  Activities of Daily Living In your present state of health, do you have any difficulty performing the following activities: 10/13/2017  Hearing? Y  Comment wearing hearing aids  Vision? N  Comment hx of cataract sx 2018  Difficulty concentrating or making decisions? N  Walking or climbing stairs? N  Dressing or bathing? N  Doing errands, shopping? N  Preparing Food and eating ? N  Using the Toilet? N  In the past six months, have you accidently leaked urine? N  Do you have problems with loss of bowel control? N  Managing your Medications? N  Managing your Finances? N  Housekeeping or managing your Housekeeping? N  Some recent data might be hidden     Immunizations and Health Maintenance Immunization History  Administered Date(s) Administered  . Influenza Whole 08/07/2008, 08/26/2009,  07/11/2010  . Influenza, High Dose Seasonal PF 08/27/2017  . Influenza,inj,Quad PF,6+ Mos 06/18/2014, 08/18/2015  . Influenza-Unspecified 09/17/2016  . Pneumococcal Conjugate-13 04/08/2014  . Pneumococcal Polysaccharide-23 10/28/2015  . Tdap 04/08/2014   There are no preventive care reminders to display for this patient.  Patient Care Team: Debbrah Alar, NP as PCP - General (Internal Medicine)  Indicate any recent Medical Services you may have received from other than [facility] providers in the past year (date may be approximate).    Assessment:   This is a routine wellness examination for Osei. Physical assessment deferred to PCP.  Hearing/Vision screen Hearing Screening Comments: Wears hearing aids. [facility]  V[facility] Screening Comments: [facility] eye exams at [facility]. Had cataract sx this year.  Dietary issues and exercise activities discussed: Current Exercise Habits: The patient does not  participate in regular exercise at present Diet (meal preparation, eat out, water intake, caffeinated beverages, dairy products, fruits and vegetables): well balanced   Goals    . Begin exercising at [facility] 1-3 x/ week (pt-stated)      Depression Screen PHQ 2/9 Scores 10/13/2017 06/14/2017 04/08/2014  PHQ - 2 Score 0 0 0    Fall Risk Fall Risk  10/13/2017 06/14/2017 05/10/2016 04/08/2014  Falls in the past year? Yes Yes Yes No  Comment Fell over dog tripped over dog leash - -  Number falls in past yr: 1 1 1  -  Injury with Fall? Yes No Yes -  Risk for fall due to : - - Impaired balance/gait -  Follow up - - Falls evaluation completed;Education provided;Falls prevention discussed -    Cognitive Function: MMSE - Mini Mental State Exam 10/13/2017  Orientation to time 5  Orientation to Place 5  Registration 3  Attention/ Calculation 4  Recall 2  Language- name 2 objects 2  Language- repeat 1  Language- follow 3 step command 3  Language- read & follow direction 1  Write a  sentence 1  Copy design 1  Total score 28        Screening Tests Health Maintenance  Topic Date Due  . TETANUS/TDAP  04/08/2024  . INFLUENZA VACCINE  Completed  . PNA vac Low Risk Adult  Completed       Plan:   Follow up with PCP as directed.  Continue to eat heart healthy diet (full of fruits, vegetables, whole grains, lean protein, water--limit salt, fat, and sugar intake) and increase physical activity as tolerated.  Continue doing brain stimulating activities (puzzles, reading, adult coloring books, staying active) to keep memory sharp.   Bring a copy of your living will and/or healthcare power of attorney to your next office visit.   I have personally reviewed and noted the following in the patient's chart:   . Medical and social history . Use of alcohol, tobacco or illicit drugs  . Current medications and supplements . Functional ability and status . Nutritional status . Physical activity . Advanced directives . List of other physicians . Hospitalizations, surgeries, and ER visits in previous 12 months . Vitals . Screenings to include cognitive, depression, and falls . Referrals and appointments  In addition, I have reviewed and discussed with patient certain preventive protocols, quality metrics, and best practice recommendations. A written personalized care plan for preventive services as well as general preventive health recommendations were provided to patient.     Shela Nevin, [place]   10/13/2017

## 2017-10-13 NOTE — Patient Instructions (Signed)
Continue to eat heart healthy diet (full of fruits, vegetables, whole grains, lean protein, water--limit salt, fat, and sugar intake) and increase physical activity as tolerated.  Continue doing brain stimulating activities (puzzles, reading, adult coloring books, staying active) to keep memory sharp.   Bring a copy of your living will and/or healthcare power of attorney to your next office visit.   [name] , Thank you for taking time to come for your Medicare Wellness Visit. I appreciate your ongoing commitment to your health goals. Please review the following plan we discussed and let me know if I can assist you in the future.   These are the goals we discussed: Goals    . Begin exercising at [facility] 1-3 x/ week (pt-stated)       This is a list of the screening recommended for you and due dates:  Health Maintenance  Topic Date Due  . Tetanus Vaccine  04/08/2024  . Flu Shot  Completed  . Pneumonia vaccines  Completed    Health Maintenance, Male A healthy lifestyle and preventive care is important for your health and wellness. Ask your health care provider about what schedule of regular examinations is right for you. What should I know about weight and diet? Eat a Healthy Diet  Eat plenty of vegetables, fruits, whole grains, low-fat dairy products, and lean protein.  Do not eat a lot of foods high in solid fats, added sugars, or salt.  Maintain a Healthy Weight Regular exercise can help you achieve or maintain a healthy weight. You should:  Do at least 150 minutes of exercise each week. The exercise should increase your heart rate and make you sweat (moderate-intensity exercise).  Do strength-training exercises at least twice a week.  Watch Your Levels of Cholesterol and Blood Lipids  Have your blood tested for lipids and cholesterol every 5 years starting at 81 years of age. If you are at high risk for heart disease, you should start having your blood tested when you  are 81 years old. You may need to have your cholesterol levels checked more often if: ? Your lipid or cholesterol levels are high. ? You are older than 81 years of age. ? You are at high risk for heart disease.  What should I know about cancer screening? Many types of cancers can be detected early and may often be prevented. Lung Cancer  You should be screened every year for lung cancer if: ? You are a current smoker who has smoked for at least 30 years. ? You are a former smoker who has quit within the past 15 years.  Talk to your health care provider about your screening options, when you should start screening, and how often you should be screened.  Colorectal Cancer  Routine colorectal cancer screening usually begins at 81 years of age and should be repeated every 5-10 years until you are 81 years old. You may need to be screened more often if early forms of precancerous polyps or small growths are found. Your health care provider may recommend screening at an earlier age if you have risk factors for colon cancer.  Your health care provider may recommend using home test kits to check for hidden blood in the stool.  A small camera at the end of a tube can be used to examine your colon (sigmoidoscopy or colonoscopy). This checks for the earliest forms of colorectal cancer.  Prostate and Testicular Cancer  Depending on your age and overall health, your  health care provider may do certain tests to screen for prostate and testicular cancer.  Talk to your health care provider about any symptoms or concerns you have about testicular or prostate cancer.  Skin Cancer  Check your skin from head to toe regularly.  Tell your health care provider about any new moles or changes in moles, especially if: ? There is a change in a mole's size, shape, or color. ? You have a mole that is larger than a pencil eraser.  Always use sunscreen. Apply sunscreen liberally and repeat throughout the  day.  Protect yourself by wearing long sleeves, pants, a wide-brimmed hat, and sunglasses when outside.  What should I know about heart disease, diabetes, and high blood pressure?  If you are 1-52 years of age, have your blood pressure checked every 3-5 years. If you are 35 years of age or older, have your blood pressure checked every year. You should have your blood pressure measured twice-once when you are at a hospital or clinic, and once when you are not at a hospital or clinic. Record the average of the two measurements. To check your blood pressure when you are not at a hospital or clinic, you can use: ? An automated blood pressure machine at a pharmacy. ? A home blood pressure monitor.  Talk to your health care provider about your target blood pressure.  If you are between 6-47 years old, ask your health care provider if you should take aspirin to prevent heart disease.  Have regular diabetes screenings by checking your fasting blood sugar level. ? If you are at a normal weight and have a low risk for diabetes, have this test once every three years after the age of 4. ? If you are overweight and have a high risk for diabetes, consider being tested at a younger age or more often.  A one-time screening for abdominal aortic aneurysm (AAA) by ultrasound is recommended for men aged 43-75 years who are current or former smokers. What should I know about preventing infection? Hepatitis B If you have a higher risk for hepatitis B, you should be screened for this virus. Talk with your health care provider to find out if you are at risk for hepatitis B infection. Hepatitis C Blood testing is recommended for:  Everyone born from [birth year] through 1963.  Anyone with known risk factors for hepatitis C.  Sexually Transmitted Diseases (STDs)  You should be screened each year for STDs including gonorrhea and chlamydia if: ? You are sexually active and are younger than 81 years of age. ? You are  older than 81 years of age and your health care provider tells you that you are at risk for this type of infection. ? Your sexual activity has changed since you were last screened and you are at an increased risk for chlamydia or gonorrhea. Ask your health care provider if you are at risk.  Talk with your health care provider about whether you are at high risk of being infected with HIV. Your health care provider may recommend a prescription medicine to help prevent HIV infection.  What else can I do?  Schedule regular health, dental, and eye exams.  Stay current with your vaccines (immunizations).  Do not use any tobacco products, such as cigarettes, chewing tobacco, and e-cigarettes. If you need help quitting, ask your health care provider.  Limit alcohol intake to no more than 2 drinks per day. One drink equals 12 ounces of beer, 5 ounces of  wine, or 1 ounces of hard liquor.  Do not use street drugs.  Do not share needles.  Ask your health care provider for help if you need support or information about quitting drugs.  Tell your health care provider if you often feel depressed.  Tell your health care provider if you have ever been abused or do not feel safe at home. This information is not intended to replace advice given to you by your health care provider. Make sure you discuss any questions you have with your health care provider. Document Released: 04/01/2008 Document Revised: 06/02/2016 Document Reviewed: 07/08/2015 Elsevier Interactive Patient Education  [employer].

## 2017-10-20 DIAGNOSIS — D0439 Carcinoma in situ of skin of other parts of face: Secondary | ICD-10-CM | POA: Diagnosis not present

## 2017-10-20 DIAGNOSIS — L814 Other melanin hyperpigmentation: Secondary | ICD-10-CM | POA: Diagnosis not present

## 2017-10-20 DIAGNOSIS — D044 Carcinoma in situ of skin of scalp and neck: Secondary | ICD-10-CM | POA: Diagnosis not present

## 2017-10-20 DIAGNOSIS — D692 Other nonthrombocytopenic purpura: Secondary | ICD-10-CM | POA: Diagnosis not present

## 2017-10-20 DIAGNOSIS — D2262 Melanocytic nevi of left upper limb, including shoulder: Secondary | ICD-10-CM | POA: Diagnosis not present

## 2017-10-20 DIAGNOSIS — L821 Other seborrheic keratosis: Secondary | ICD-10-CM | POA: Diagnosis not present

## 2017-10-20 DIAGNOSIS — D1801 Hemangioma of skin and subcutaneous tissue: Secondary | ICD-10-CM | POA: Diagnosis not present

## 2017-10-20 DIAGNOSIS — D225 Melanocytic nevi of trunk: Secondary | ICD-10-CM | POA: Diagnosis not present

## 2017-10-20 DIAGNOSIS — D2261 Melanocytic nevi of right upper limb, including shoulder: Secondary | ICD-10-CM | POA: Diagnosis not present

## 2017-10-20 DIAGNOSIS — D2272 Melanocytic nevi of left lower limb, including hip: Secondary | ICD-10-CM | POA: Diagnosis not present

## 2017-10-28 ENCOUNTER — Telehealth: Payer: Self-pay | Admitting: *Deleted

## 2017-10-28 MED ORDER — LOSARTAN POTASSIUM 25 MG PO TABS: ORAL_TABLET | ORAL | 1 refills | Status: DC

## 2017-10-28 NOTE — Telephone Encounter (Signed)
Received fax from [facility] requesting refill of losartan. Refills sent.

## 2017-11-01 DIAGNOSIS — D0439 Carcinoma in situ of skin of other parts of face: Secondary | ICD-10-CM | POA: Diagnosis not present

## 2017-11-01 DIAGNOSIS — D044 Carcinoma in situ of skin of scalp and neck: Secondary | ICD-10-CM | POA: Diagnosis not present

## 2017-11-18 ENCOUNTER — Telehealth: Payer: Self-pay

## 2017-11-18 NOTE — Telephone Encounter (Signed)
OK with me.

## 2017-11-18 NOTE — Telephone Encounter (Signed)
Copied from [place]. Topic: Quick Communication - See Telephone Encounter >> Nov 18, 2017 11:18 AM Antonieta Iba C wrote: CRM for notification. See Telephone encounter for: pt's spouse Diane called in to transition care to [employer] from [place]'. Is this switch okay?   Please assist further. CB: [phone]  11/18/17.

## 2017-11-18 NOTE — Telephone Encounter (Signed)
Pt requesting to transfer from [place] to [place].

## 2017-11-18 NOTE — Telephone Encounter (Signed)
[place] with me. Please have him make an establish care visit

## 2017-12-07 NOTE — Telephone Encounter (Signed)
Glenn Ortega is going to call patient and schedule this.

## 2017-12-07 NOTE — Telephone Encounter (Signed)
Appt scheduled 12/29/2017

## 2017-12-29 ENCOUNTER — Ambulatory Visit: Payer: PPO | Admitting: Adult Health

## 2017-12-30 ENCOUNTER — Ambulatory Visit: Payer: PPO | Admitting: Adult Health

## 2018-01-05 ENCOUNTER — Encounter: Payer: Self-pay | Admitting: Adult Health

## 2018-01-05 ENCOUNTER — Ambulatory Visit (INDEPENDENT_AMBULATORY_CARE_PROVIDER_SITE_OTHER): Payer: Medicare HMO | Admitting: Adult Health

## 2018-01-05 ENCOUNTER — Telehealth: Payer: Self-pay

## 2018-01-05 VITALS — BP 154/86 | Temp 97.4°F | Wt 228.0 lb

## 2018-01-05 DIAGNOSIS — E785 Hyperlipidemia, unspecified: Secondary | ICD-10-CM

## 2018-01-05 DIAGNOSIS — M545 Low back pain, unspecified: Secondary | ICD-10-CM

## 2018-01-05 DIAGNOSIS — I10 Essential (primary) hypertension: Secondary | ICD-10-CM

## 2018-01-05 DIAGNOSIS — G8929 Other chronic pain: Secondary | ICD-10-CM | POA: Diagnosis not present

## 2018-01-05 DIAGNOSIS — Z7689 Persons encountering health services in other specified circumstances: Secondary | ICD-10-CM

## 2018-01-05 DIAGNOSIS — H109 Unspecified conjunctivitis: Secondary | ICD-10-CM | POA: Diagnosis not present

## 2018-01-05 MED ORDER — ATORVASTATIN CALCIUM 10 MG PO TABS: 10.0000 mg | ORAL_TABLET | Freq: Every day | ORAL | 2 refills | Status: DC

## 2018-01-05 MED ORDER — ERYTHROMYCIN 5 MG/GM OP OINT: TOPICAL_OINTMENT | Freq: Three times a day (TID) | OPHTHALMIC | 0 refills | Status: AC

## 2018-01-05 NOTE — Telephone Encounter (Signed)
Spoke to [place] at the pharmacy and advised the correct directions are to apply thin ribbon to affected eye(s) once daily for 7 days. No further action required.  Will close note.

## 2018-01-05 NOTE — Progress Notes (Signed)
Patient presents to clinic today to establish care. He is a pleasant 84 year old male who  has a past medical history of GERD (gastroesophageal reflux disease), Hypertension, and Spinal stenosis. 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 Was seen by [facility] and told to put warm compresses on it. Reports waking up in the morning with eye lids stuck shut. Has purulent discharge and itchiness. No changes in vision.    Chronic Issues: HTN - Cozaar 25 mg - stable. Did not take his medications this morning  BP Readings from Last 3 Encounters:  01/05/18 (!) 154/86  10/13/17 136/70  09/14/17 130/62   Hyperlipidemia - Lipitor 10 mg & ASA 81 mg  Lab Results  Component Value Date   CHOL 167 09/14/2017   HDL 65.20 09/14/2017   LDLCALC 79 09/14/2017   TRIG 116.0 09/14/2017   CHOLHDL 3 09/14/2017   History of Spinal Stenosis -  Had lumbar laminectomy/decompression in 2015 by Dr. Hal Neer at [facility]. He is starting to have pain in the same area which prevents him from walking distances. He would like to go back and see him.   Health Maintenance: Dental -- Does not do routine care.  Vision -- Routine Basis  Immunizations -- UTD Colonoscopy -- No longer needed   Past Medical History:  Diagnosis Date  . GERD (gastroesophageal reflux disease)   . Hypertension    EKG and chest x ray 8/12 [facility]/states had stress test 2 yrs ago- doesnt remember where- not in EPIC  . Spinal stenosis    lumbar    Past Surgical History:  Procedure Laterality Date  . COLONOSCOPY    . CYSTOSCOPY  12/30/2011   Procedure: CYSTOSCOPY;  Surgeon: Reece Packer, MD;  Location: [facility];  Service: Urology;  Laterality: N/A;  . EYE SURGERY Bilateral 06/18/2017   cataract sx with lens implant  . HERNIA REPAIR  2013  . HERNIA REPAIR Right     age  37  . LUMBAR LAMINECTOMY/DECOMPRESSION MICRODISCECTOMY N/A 11/09/2013   Procedure: Lumbar Laminectomy/Decompression, Microdiscectomy Lumbar Three-Four, Four-Five, with Coflex;  Surgeon: Faythe Ghee, MD;  Location: [facility];  Service: Neurosurgery;  Laterality: N/A;  Lumbar Laminectomy/Decompression, Microdiscectomy Lumbar Three-Four, Four-Five, with Coflex  . PAROTIDECTOMY Left 11/17/2015   Procedure: LEFT PAROTIDECTOMY;  Surgeon: Izora Gala, MD;  Location: [place];  Service: ENT;  Laterality: Left;  . PROSTATE SURGERY  2013  . TONSILLECTOMY    . trigger thumb  1980   right    Current Outpatient Medications on File Prior to Visit  Medication Sig Dispense Refill  . atorvastatin (LIPITOR) 10 MG tablet Take 1 tablet (10 mg total) by mouth daily. 90 tablet 1  . losartan (COZAAR) 25 MG tablet Take 1 tablet by mouth  daily 90 tablet 1  . Multiple Vitamins-Minerals (PRESERVISION AREDS 2 PO) Take by mouth.    Marland Kitchen omeprazole (PRILOSEC) 20 MG capsule Take 1 capsule (20 mg total) by mouth daily. 30 capsule 3  . [DISCONTINUED] terazosin (HYTRIN) 1 MG capsule take 1 capsule by mouth once daily 30 capsule 0   No current facility-administered medications on file prior to visit.     Allergies  Allergen Reactions  . Penicillins Swelling    CHILDHOOD ALLERGY Has patient had a PCN reaction causing  immediate rash, facial/tongue/throat swelling, SOB or lightheadedness with hypotension: Yes Has patient had a PCN reaction causing severe rash involving mucus membranes or skin necrosis: No Has patient had a PCN reaction that required hospitalization: No Has patient had a PCN reaction occurring within the last 10 years: No If all of the above answers are "NO", then may proceed with Cephalosporin use.     Family History  Problem Relation Age of Onset  . Hypertension Unknown   . Cancer Mother        bone?  . Myasthenia gravis Father   . Heart disease Brother     Social  History   Socioeconomic History  . Marital status: Married    Spouse name: [name]  . Number of children: Not on file  . Years of education: Not on file  . Highest education level: Not on file  Occupational History  . Occupation: Estate agent: [employer]  . Financial resource strain: Not on file  . Food insecurity:    Worry: Not on file    Inability: Not on file  . Transportation needs:    Medical: Not on file    Non-medical: Not on file  Tobacco Use  . Smoking status: Never Smoker  . Smokeless tobacco: Never Used  . Tobacco comment: per patient he never smoke. 11/25/2014 /rc  Substance and Sexual Activity  . Alcohol use: Yes    Alcohol/week: 1.2 oz    Types: 2 Standard drinks or equivalent per week    Comment: socially- occ beer  . Drug use: No  . Sexual activity: Not Currently  Lifestyle  . Physical activity:    Days per week: Not on file    Minutes per session: Not on file  . Stress: Not on file  Relationships  . Social connections:    Talks on phone: Not on file    Gets together: Not on file    Attends religious service: Not on file    Active member of club or organization: Not on file    Attends meetings of clubs or organizations: Not on file    Relationship status: Not on file  . Intimate partner violence:    Fear of current or ex partner: Not on file    Emotionally abused: Not on file    Physically abused: Not on file    Forced sexual activity: Not on file  Other Topics Concern  . Not on file  Social History Narrative   Works 3 nights a week at [employer].    Previously worked as a Freight forwarder for [employer].   Married to [place]. 2 caffeinated drinks per day. Does not exercise he says secondary to back problems.  Lives in a one story home.     Has 2 children.  Education: some college. He is an is in [facility] from [place]  Constitutional: Negative.   HENT: Positive for hearing loss.   Eyes:  Positive for discharge and redness.  Respiratory: Negative.   Cardiovascular: Negative.   Gastrointestinal: Negative.   Genitourinary: Negative.   Musculoskeletal: Positive for back pain. Negative for joint pain.  Skin: Negative.   Neurological: Negative.   Endo/Heme/Allergies: Negative.   All other systems reviewed and are negative.   BP (!) 154/86 (BP Location: Left Arm)   Temp (!) 97.4 F (36.3 C) (Oral)   Wt 228 lb (103.4 kg)   BMI 34.67  kg/m   Physical Exam  Constitutional: He is oriented to person, place, and time and well-developed, well-nourished, and in no distress. No distress.  HENT:  Head: Normocephalic and atraumatic.  Right Ear: External ear normal.  Left Ear: External ear normal.  Nose: Nose normal.  Mouth/Throat: Oropharynx is clear and moist. No oropharyngeal exudate.  Eyes: Pupils are equal, round, and reactive to light. EOM are normal. Lids are everted and swept, no foreign bodies found. Right eye exhibits discharge. Left eye exhibits no discharge. Right conjunctiva is injected. No scleral icterus.  Neck: Normal range of motion. Neck supple. No JVD present. No tracheal deviation present. No thyromegaly present.  Cardiovascular: Normal rate, regular rhythm, normal heart sounds and intact distal pulses. Exam reveals no gallop and no friction rub.  No murmur heard. Pulmonary/Chest: Effort normal and breath sounds normal. No stridor. No respiratory distress. He has no wheezes. He has no rales. He exhibits no tenderness.  Abdominal: Soft. Bowel sounds are normal. He exhibits no distension and no mass. There is no tenderness. There is no rebound and no guarding.  Musculoskeletal: Normal range of motion. He exhibits no edema, tenderness or deformity.  Midline surgical scar on low back  Lymphadenopathy:    He has no cervical adenopathy.  Neurological: He is alert and oriented to person, place, and time. He displays normal reflexes. No cranial nerve deficit. He exhibits  normal muscle tone. Coordination normal. GCS score is 15.  Skin: Skin is warm and dry. No rash noted. He is not diaphoretic. No erythema. No pallor.  Psychiatric: Mood, memory, affect and judgment normal.  Nursing note and vitals reviewed.   Assessment/Plan:  1. Encounter to establish care - Follow up in November for CPE  - Follow up sooner if needed  2. HYPERTENSION, BENIGN - Well controlled, no change in medication  3. Bacterial conjunctivitis of right eye  - erythromycin (ROMYCIN) ophthalmic ointment; Place into the right eye 3 (three) times daily for 7 days. Apply thin ribbon to affected eye(s) once daily for 7 days.  Dispense: 1 g; Refill: 0  4. Chronic midline low back pain without sciatica  - Ambulatory referral to Neurosurgery  5. Hyperlipidemia, unspecified hyperlipidemia type  - atorvastatin (LIPITOR) 10 MG tablet; Take 1 tablet (10 mg total) by mouth daily.  Dispense: 90 tablet; Refill: 2  [name], NP

## 2018-01-05 NOTE — Telephone Encounter (Signed)
Copied from [place] [id number]. Topic: Quick Communication - See Telephone Encounter >> Jan 05, 2018  9:27 AM Antonieta Iba C wrote: CRM for notification. See Telephone encounter for: 01/05/18.   Pharmacy called in to get clarity on Rx directions for erythromycin [facility]) ophthalmic ointment. Please advise at: [email or web address]

## 2018-02-21 ENCOUNTER — Encounter: Payer: Self-pay | Admitting: Family Medicine

## 2018-02-21 ENCOUNTER — Ambulatory Visit (INDEPENDENT_AMBULATORY_CARE_PROVIDER_SITE_OTHER): Payer: Medicare HMO | Admitting: Family Medicine

## 2018-02-21 VITALS — BP 119/75 | HR 78 | Wt 231.0 lb

## 2018-02-21 DIAGNOSIS — G5602 Carpal tunnel syndrome, left upper limb: Secondary | ICD-10-CM

## 2018-02-21 DIAGNOSIS — G5601 Carpal tunnel syndrome, right upper limb: Secondary | ICD-10-CM

## 2018-02-21 NOTE — Patient Instructions (Signed)
Thank you for coming in today. Call or go to the ER if you develop a large red swollen joint with extreme pain or oozing puss.  Take it easy for a few weeks.  Return as needed.

## 2018-02-21 NOTE — Progress Notes (Signed)
Glenn Ortega is a 84 y.o. male who presents to [place] today for Bilateral carpal tunnel syndrome.  Glenn Ortega has a history of bilateral carpal tunnel syndrome with injections most recently of the right November 2018 and left March 2018.  He notes numbness and tingling and discomfort into his first 3 digits of hands bilaterally.  He denies any weakness feels pretty well otherwise.   Past Medical History:  Diagnosis Date  . GERD (gastroesophageal reflux disease)   . Hyperlipidemia   . Hypertension    EKG and chest x ray 8/12 [facility]/states had stress test 2 yrs ago- doesnt remember where- not in EPIC  . Spinal stenosis    lumbar   Past Surgical History:  Procedure Laterality Date  . COLONOSCOPY    . CYSTOSCOPY  12/30/2011   Procedure: CYSTOSCOPY;  Surgeon: Reece Packer, MD;  Location: [facility];  Service: Urology;  Laterality: N/A;  . EYE SURGERY Bilateral 06/18/2017   cataract sx with lens implant  . HERNIA REPAIR  2013  . HERNIA REPAIR Right    age  26  . LUMBAR LAMINECTOMY/DECOMPRESSION MICRODISCECTOMY N/A 11/09/2013   Procedure: Lumbar Laminectomy/Decompression, Microdiscectomy Lumbar Three-Four, Four-Five, with Coflex;  Surgeon: Faythe Ghee, MD;  Location: [facility];  Service: Neurosurgery;  Laterality: N/A;  Lumbar Laminectomy/Decompression, Microdiscectomy Lumbar Three-Four, Four-Five, with Coflex  . PAROTIDECTOMY Left 11/17/2015   Procedure: LEFT PAROTIDECTOMY;  Surgeon: Izora Gala, MD;  Location: [place];  Service: ENT;  Laterality: Left;  . PROSTATE SURGERY  2013  . TONSILLECTOMY    . trigger thumb  1980   right   Social History   Tobacco Use  . Smoking status: Never Smoker  . Smokeless tobacco: Never Used  . Tobacco comment: per patient he never smoke. 11/25/2014 /rc  Substance Use Topics  . Alcohol use: Yes    Alcohol/week: 1.2 oz    Types: 2 Standard drinks or equivalent per week    Comment:  socially- occ beer     ROS:  As above   Medications: Current Outpatient Medications  Medication Sig Dispense Refill  . atorvastatin (LIPITOR) 10 MG tablet Take 1 tablet (10 mg total) by mouth daily. 90 tablet 2  . losartan (COZAAR) 25 MG tablet Take 1 tablet by mouth  daily 90 tablet 1  . Multiple Vitamins-Minerals (PRESERVISION AREDS 2 PO) Take by mouth.    Marland Kitchen omeprazole (PRILOSEC) 20 MG capsule Take 1 capsule (20 mg total) by mouth daily. 30 capsule 3   No current facility-administered medications for this visit.    Allergies  Allergen Reactions  . Penicillins Swelling    CHILDHOOD ALLERGY Has patient had a PCN reaction causing immediate rash, facial/tongue/throat swelling, SOB or lightheadedness with hypotension: Yes Has patient had a PCN reaction causing severe rash involving mucus membranes or skin necrosis: No Has patient had a PCN reaction that required hospitalization: No Has patient had a PCN reaction occurring within the last 10 years: No If all of the above answers are "NO", then may proceed with Cephalosporin use.      Exam:  BP 119/75   Pulse 78   Wt 231 lb (104.8 kg)   BMI 35.12 kg/m  General: Well Developed, well nourished, and in no acute distress.  Neuro/Psych: Alert and oriented x3, extra-ocular muscles intact, able to move all 4 extremities, sensation grossly intact. Skin: Warm and dry, no rashes noted.  Respiratory: Not using accessory muscles, speaking in  full sentences, trachea midline.  Cardiovascular: Pulses palpable, no extremity edema. Abdomen: Does not appear distended. MSK:  Hands bilaterally show no thenar atrophy.  Normal sensation strength pulses capillary refill   Procedure: Real-time Ultrasound Guided Injection of right wrist median nerve hydrodissection at carpal tunnel Device: GE Logiq E  Images permanently stored and available for review in the ultrasound unit. Verbal informed consent obtained. Discussed risks and benefits of  procedure. Warned about infection bleeding damage to structures skin hypopigmentation and fat atrophy among others. Patient expresses understanding and agreement Time-out conducted.  Noted no overlying erythema, induration, or other signs of local infection.  Skin prepped in a sterile fashion.  Local anesthesia: Topical Ethyl chloride.  With sterile technique and under real time ultrasound guidance: A 25-gauge needle was advanced under ultrasound guidance to the area superficialto the median nerve. . A total of 1.5  mL of lidocaine and 40 mg of Kenalog were injected around the median nerve. Completed without difficulty  Patienttolerated the procedure well.  Advisedto call if fevers/chills, erythema, induration, drainage, or persistent bleeding.  Images permanently stored and available for review in the ultrasound unit.  Impression: Technically successful ultrasound guided injection.  Procedure: Real-time Ultrasound Guided Injection of left wrist median nerve hydrodissection at carpal tunnel Device: GE Logiq E  Images permanently stored and available for review in the ultrasound unit. Verbal informed consent obtained. Discussed risks and benefits of procedure. Warned about infection bleeding damage to structures skin hypopigmentation and fat atrophy among others. Patient expresses understanding and agreement Time-out conducted.  Noted no overlying erythema, induration, or other signs of local infection.  Skin prepped in a sterile fashion.  Local anesthesia: Topical Ethyl chloride.  With sterile technique and under real time ultrasound guidance: A 25-gauge needle was advanced under ultrasound guidance to the area superficial to the median nerve. . A total of 1.5 mL of lidocaine and 40 mg of Kenalog were injected around the median nerve. Completed without difficulty  Patient tolerated the procedure well.  Advised to call if fevers/chills, erythema, induration, drainage, or  persistent bleeding.  Images permanently stored and available for review in the ultrasound unit.  Impression: Technically successful ultrasound guided injection.     Assessment and Plan: 84 y.o. male with  Bilateral carpal tunnel syndrome status post bilateral injections today.  Plan for relative rest with night splint.  Recheck as needed for this issue in the future.   Discussed warning signs or symptoms. Please see discharge instructions. Patient expresses understanding.

## 2018-03-15 ENCOUNTER — Ambulatory Visit: Payer: PPO | Admitting: Family

## 2018-03-15 DIAGNOSIS — D044 Carcinoma in situ of skin of scalp and neck: Secondary | ICD-10-CM | POA: Diagnosis not present

## 2018-03-15 DIAGNOSIS — L821 Other seborrheic keratosis: Secondary | ICD-10-CM | POA: Diagnosis not present

## 2018-03-15 DIAGNOSIS — Z85828 Personal history of other malignant neoplasm of skin: Secondary | ICD-10-CM | POA: Diagnosis not present

## 2018-03-15 DIAGNOSIS — Z0289 Encounter for other administrative examinations: Secondary | ICD-10-CM

## 2018-03-21 DIAGNOSIS — I10 Essential (primary) hypertension: Secondary | ICD-10-CM | POA: Diagnosis not present

## 2018-03-21 DIAGNOSIS — Z8249 Family history of ischemic heart disease and other diseases of the circulatory system: Secondary | ICD-10-CM | POA: Diagnosis not present

## 2018-03-21 DIAGNOSIS — Z85828 Personal history of other malignant neoplasm of skin: Secondary | ICD-10-CM | POA: Diagnosis not present

## 2018-03-21 DIAGNOSIS — Z880 Allergy status to penicillin: Secondary | ICD-10-CM | POA: Diagnosis not present

## 2018-03-21 DIAGNOSIS — Z87892 Personal history of anaphylaxis: Secondary | ICD-10-CM | POA: Diagnosis not present

## 2018-03-21 DIAGNOSIS — E669 Obesity, unspecified: Secondary | ICD-10-CM | POA: Diagnosis not present

## 2018-03-21 DIAGNOSIS — E785 Hyperlipidemia, unspecified: Secondary | ICD-10-CM | POA: Diagnosis not present

## 2018-03-21 DIAGNOSIS — Z6835 Body mass index (BMI) 35.0-35.9, adult: Secondary | ICD-10-CM | POA: Diagnosis not present

## 2018-04-14 DIAGNOSIS — D485 Neoplasm of uncertain behavior of skin: Secondary | ICD-10-CM | POA: Diagnosis not present

## 2018-04-14 DIAGNOSIS — L570 Actinic keratosis: Secondary | ICD-10-CM | POA: Diagnosis not present

## 2018-05-08 ENCOUNTER — Ambulatory Visit: Payer: Self-pay | Admitting: Adult Health

## 2018-05-08 NOTE — Telephone Encounter (Signed)
Pt c/o feeling "out of balance" when he walks. Denied any weakness or numbness to the right or left side of the body. Pt stated symptoms began today. He initally thought it was his glasses but the symptoms remain after removing the glasses. Pt stated he is not dizzy or lightheaded. He stated the out of balance feeling is mild and does not have difficulty walking while trying to maintain his balance.  Pt denies fever, chest pain vomiting, diarrhea and bleeding.  Pt took BP and HR measurement during the call: 125/61 HR 52.  Care advice given and pt verbalized understanding. Pt given a appt for Wednesday with PCP.  Reason for Disposition . [1] MILD dizziness (e.g., walking normally) AND [2] has NOT been evaluated by physician for this  (Exception: dizziness caused by heat exposure, sudden standing, or poor fluid intake)  Answer Assessment - Initial Assessment Questions 1. DESCRIPTION: "Describe your dizziness."     Feels like not walking right thinks balance is off 2. LIGHTHEADED: "Do you feel lightheaded?" (e.g., somewhat faint, woozy, weak upon standing)     no 3. VERTIGO: "Do you feel like either you or the room is spinning or tilting?" (i.e. vertigo)     no 4. SEVERITY: "How bad is it?"  "Do you feel like you are going to faint?" "Can you stand and walk?"   - MILD - walking normally   - MODERATE - interferes with normal activities (e.g., work, school)    - SEVERE - unable to stand, requires support to walk, feels like passing out now.      mild 5. ONSET:  "When did the dizziness begin?"     today 6. AGGRAVATING FACTORS: "Does anything make it worse?" (e.g., standing, change in head position)     walking 7. HEART RATE: "Can you tell me your heart rate?" "How many beats in 15 seconds?"  (Note: not all patients can do this)       BP  125/61       HR   52 8. CAUSE: "What do you think is causing the dizziness?"  no idea thought was his glasses sx remain after the glasses were removed 9.  RECURRENT SYMPTOM: "Have you had dizziness before?" If so, ask: "When was the last time?" "What happened that time?"     no 10. OTHER SYMPTOMS: "Do you have any other symptoms?" (e.g., fever, chest pain, vomiting, diarrhea, bleeding)       no 11. PREGNANCY: "Is there any chance you are pregnant?" "When was your last menstrual period?"       n/a  Protocols used: DIZZINESS [facility]

## 2018-05-10 ENCOUNTER — Ambulatory Visit (INDEPENDENT_AMBULATORY_CARE_PROVIDER_SITE_OTHER): Payer: Medicare HMO | Admitting: Adult Health

## 2018-05-10 ENCOUNTER — Encounter: Payer: Self-pay | Admitting: Adult Health

## 2018-05-10 VITALS — BP 140/80 | Temp 97.5°F | Wt 229.0 lb

## 2018-05-10 DIAGNOSIS — R200 Anesthesia of skin: Secondary | ICD-10-CM | POA: Diagnosis not present

## 2018-05-10 DIAGNOSIS — R42 Dizziness and giddiness: Secondary | ICD-10-CM | POA: Diagnosis not present

## 2018-05-10 LAB — BASIC METABOLIC PANEL
BUN: 21 mg/dL (ref 6–23)
CALCIUM: 8.7 mg/dL (ref 8.4–10.5)
CO2: 28 meq/L (ref 19–32)
Chloride: 106 mEq/L (ref 96–112)
Creatinine, Ser: 1.09 mg/dL (ref 0.40–1.50)
GFR: 68.86 mL/min (ref >60.00)
Glucose, Bld: 99 mg/dL (ref 70–99)
Potassium: 4 mEq/L (ref 3.5–5.1)
SODIUM: 142 meq/L (ref 135–145)

## 2018-05-10 MED ORDER — PREDNISONE 10 MG PO TABS: 10.0000 mg | ORAL_TABLET | Freq: Every day | ORAL | 0 refills | Status: DC

## 2018-05-10 NOTE — Progress Notes (Signed)
Subjective:    Patient ID: Glenn Ortega, male    DOB: 1933-12-30, 84 y.o.   MRN: [id number]  HPI 84 year old male who  has a past medical history of GERD (gastroesophageal reflux disease), Hyperlipidemia, Hypertension, and Spinal stenosis.  He presents to the office today for an acute issue of lightheadedness and feeling off balance. His symptoms presented 3 days ago. Each time he has had an episode he monitored his BP and it was slightly low at 113/70-80's ( low for him). This sensation lasts most of the day. He has been eating well but does not believe he is drinking enough fluids, and it has been hot at work. Denies syncope or pre syncope. Has not had any chest pain or SOB.   He also reports that over the last month he has started to feel a constant numbness on the left side of his face from his ear to mouth. He had a salivary gland removed on the left side about 5 years ago and had the same symptoms s/p surgery. Denies blurred vision or headaches.   BP Readings from Last 3 Encounters:  05/10/18 140/80  02/21/18 119/75  01/05/18 (!) 154/86    Review of Systems See HPI   Past Medical History:  Diagnosis Date  . GERD (gastroesophageal reflux disease)   . Hyperlipidemia   . Hypertension    EKG and chest x ray 8/12 [facility]/states had stress test 2 yrs ago- doesnt remember where- not in [facility]  . Spinal stenosis    lumbar    Social History   Socioeconomic History  . Marital status: Married    Spouse name: [name]  . Number of children: Not on file  . Years of education: Not on file  . Highest education level: Not on file  Occupational History  . Occupation: Estate agent: [employer]  . Financial resource strain: Not on file  . Food insecurity:    Worry: Not on file    Inability: Not on file  . Transportation needs:    Medical: Not on file    Non-medical: Not on file  Tobacco Use  . Smoking status: Never Smoker  . Smokeless tobacco: Never Used  .  Tobacco comment: per patient he never smoke. 11/25/2014 /rc  Substance and Sexual Activity  . Alcohol use: Yes    Alcohol/week: 1.2 oz    Types: 2 Standard drinks or equivalent per week    Comment: socially- occ beer  . Drug use: No  . Sexual activity: Not Currently  Lifestyle  . Physical activity:    Days per week: Not on file    Minutes per session: Not on file  . Stress: Not on file  Relationships  . Social connections:    Talks on phone: Not on file    Gets together: Not on file    Attends religious service: Not on file    Active member of club or organization: Not on file    Attends meetings of clubs or organizations: Not on file    Relationship status: Not on file  . Intimate partner violence:    Fear of current or ex partner: Not on file    Emotionally abused: Not on file    Physically abused: Not on file    Forced sexual activity: Not on file  Other Topics Concern  . 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    Previously worked as a Freight forwarder for [employer].   Married to [place]. 2 caffeinated drinks per day. Does not exercise he says secondary to back problems.  Lives in a one story home.     Has 2 children.  Education: some college. He is an is in [facility] from [place]    Past Surgical History:  Procedure Laterality Date  . COLONOSCOPY    . CYSTOSCOPY  12/30/2011   Procedure: CYSTOSCOPY;  Surgeon: Reece Packer, MD;  Location: [facility];  Service: Urology;  Laterality: N/A;  . EYE SURGERY Bilateral 06/18/2017   cataract sx with lens implant  . HERNIA REPAIR  2013  . HERNIA REPAIR Right    age  27  . LUMBAR LAMINECTOMY/DECOMPRESSION MICRODISCECTOMY N/A 11/09/2013   Procedure: Lumbar Laminectomy/Decompression, Microdiscectomy Lumbar Three-Four, Four-Five, with Coflex;  Surgeon: Faythe Ghee, MD;  Location: [facility];  Service: Neurosurgery;  Laterality: N/A;  Lumbar Laminectomy/Decompression, Microdiscectomy  Lumbar Three-Four, Four-Five, with Coflex  . PAROTIDECTOMY Left 11/17/2015   Procedure: LEFT PAROTIDECTOMY;  Surgeon: Izora Gala, MD;  Location: [facility];  Service: ENT;  Laterality: Left;  . PROSTATE SURGERY  2013  . TONSILLECTOMY    . trigger thumb  1980   right    Family History  Problem Relation Age of Onset  . Hypertension Unknown   . Cancer Mother        bone?  . Myasthenia gravis Father   . Heart disease Brother     Allergies  Allergen Reactions  . Penicillins Swelling    CHILDHOOD ALLERGY Has patient had a PCN reaction causing immediate rash, facial/tongue/throat swelling, SOB or lightheadedness with hypotension: Yes Has patient had a PCN reaction causing severe rash involving mucus membranes or skin necrosis: No Has patient had a PCN reaction that required hospitalization: No Has patient had a PCN reaction occurring within the last 10 years: No If all of the above answers are "NO", then may proceed with Cephalosporin use.     Current Outpatient Medications on File Prior to Visit  Medication Sig Dispense Refill  . atorvastatin (LIPITOR) 10 MG tablet Take 1 tablet (10 mg total) by mouth daily. 90 tablet 2  . losartan (COZAAR) 25 MG tablet Take 1 tablet by mouth  daily 90 tablet 1  . Multiple Vitamins-Minerals (PRESERVISION AREDS 2 PO) Take by mouth.    . [DISCONTINUED] terazosin (HYTRIN) 1 MG capsule take 1 capsule by mouth once daily 30 capsule 0   No current facility-administered medications on file prior to visit.     BP 140/80   Temp (!) 97.5 F (36.4 C) (Oral)   Wt 229 lb (103.9 kg)   BMI 34.82 kg/m       Objective:   Physical Exam  Constitutional: He is oriented to person, place, and time. He appears well-developed and well-nourished. No distress.  Cardiovascular: Normal rate, regular rhythm, normal heart sounds and intact distal pulses.  Pulmonary/Chest: Effort normal and breath sounds normal.  Musculoskeletal:  Old surgical scar  on left side of face   Neurological: He is alert and oriented to person, place, and time. He has normal strength. He displays no atrophy and no tremor. No cranial nerve deficit. He exhibits normal muscle tone. GCS eye subscore is 4. GCS verbal subscore is 5. GCS motor subscore is 6.  Negative Neuro Exam    Skin: Skin is warm. Capillary refill takes less than 2 seconds. He is  not diaphoretic.  Psychiatric: He has a normal mood and affect. His behavior is normal. Judgment and thought content normal.  Nursing note and vitals reviewed.     Assessment & Plan:  1. Lightheadedness - possibly related to dehydration? Will check labs. Advised to increase fluids.  - CBC with Differential/Platelet - Basic Metabolic Panel  2. Facial numbness *- No signs of stroke. Likely nerve issue. Will trial prednisone to see if it helps with inflammation.  - predniSONE (DELTASONE) 10 MG tablet; Take 1 tablet (10 mg total) by mouth daily with breakfast.  Dispense: 7 tablet; Refill: 0 - Consider imaging if no improvement   [name], NP

## 2018-05-11 ENCOUNTER — Other Ambulatory Visit: Payer: Self-pay | Admitting: Family Medicine

## 2018-05-11 NOTE — Telephone Encounter (Signed)
Cory, you have not prescribed this medication in the past.  Please advise. 

## 2018-05-14 MED ORDER — LOSARTAN POTASSIUM 25 MG PO TABS: ORAL_TABLET | ORAL | 1 refills | Status: DC

## 2018-07-11 ENCOUNTER — Ambulatory Visit: Payer: Medicare HMO | Admitting: Adult Health

## 2018-07-11 DIAGNOSIS — Z0289 Encounter for other administrative examinations: Secondary | ICD-10-CM

## 2018-07-13 ENCOUNTER — Encounter: Payer: Self-pay | Admitting: Adult Health

## 2018-07-13 ENCOUNTER — Ambulatory Visit (INDEPENDENT_AMBULATORY_CARE_PROVIDER_SITE_OTHER): Payer: Medicare HMO | Admitting: Adult Health

## 2018-07-13 VITALS — BP 130/60 | HR 43 | Temp 97.6°F | Wt 231.8 lb

## 2018-07-13 DIAGNOSIS — R059 Cough, unspecified: Secondary | ICD-10-CM

## 2018-07-13 DIAGNOSIS — R05 Cough: Secondary | ICD-10-CM

## 2018-07-13 MED ORDER — PREDNISONE 10 MG PO TABS: ORAL_TABLET | ORAL | 0 refills | Status: DC

## 2018-07-13 MED ORDER — FLUTICASONE PROPIONATE 50 MCG/ACT NA SUSP: 2.0000 | Freq: Every day | NASAL | 6 refills | Status: DC

## 2018-07-13 NOTE — Progress Notes (Signed)
Subjective:    Patient ID: Glenn Ortega, male    DOB: 1933/11/02, 84 y.o.   MRN: [id number]  Cough  This is a new problem. The current episode started 1 to 4 weeks ago (2 weeks). The cough is non-productive. Associated symptoms include postnasal drip, rhinorrhea and wheezing. The symptoms are aggravated by other (eating, drinking, and talking). He has tried nothing for the symptoms. The treatment provided mild relief. His past medical history is significant for environmental allergies.    Review of Systems  Constitutional: Negative.   HENT: Positive for postnasal drip and rhinorrhea.   Respiratory: Positive for cough and wheezing.   Gastrointestinal: Negative.   Musculoskeletal: Negative.   Allergic/Immunologic: Positive for environmental allergies.   Past Medical History:  Diagnosis Date  . GERD (gastroesophageal reflux disease)   . Hyperlipidemia   . Hypertension    EKG and chest x ray 8/12 [facility]/states had stress test 2 yrs ago- doesnt remember where- not in [facility]  . Spinal stenosis    lumbar    Social History   Socioeconomic History  . Marital status: Married    Spouse name: [name]  . Number of children: Not on file  . Years of education: Not on file  . Highest education level: Not on file  Occupational History  . Occupation: Estate agent: [employer]  . Financial resource strain: Not on file  . Food insecurity:    Worry: Not on file    Inability: Not on file  . Transportation needs:    Medical: Not on file    Non-medical: Not on file  Tobacco Use  . Smoking status: Never Smoker  . Smokeless tobacco: Never Used  . Tobacco comment: per patient he never smoke. 11/25/2014 /rc  Substance and Sexual Activity  . Alcohol use: Yes    Alcohol/week: 2.0 standard drinks    Types: 2 Standard drinks or equivalent per week    Comment: socially- occ beer  . Drug use: No  . Sexual activity: Not Currently  Lifestyle  . Physical activity:    Days  per week: Not on file    Minutes per session: Not on file  . Stress: Not on file  Relationships  . Social connections:    Talks on phone: Not on file    Gets together: Not on file    Attends religious service: Not on file    Active member of club or organization: Not on file    Attends meetings of clubs or organizations: Not on file    Relationship status: Not on file  . Intimate partner violence:    Fear of current or ex partner: Not on file    Emotionally abused: Not on file    Physically abused: Not on file    Forced sexual activity: Not on file  Other Topics Concern  . Not on file  Social History Narrative   Works 3 nights a week at [employer].    Previously worked as a Freight forwarder for [employer].   Married to [place]. 2 caffeinated drinks per day. Does not exercise he says secondary to back problems.  Lives in a one story home.     Has 2 children.  Education: some college. He is an is in [employer] from [place]    Past Surgical History:  Procedure Laterality Date  . COLONOSCOPY    . CYSTOSCOPY  12/30/2011   Procedure: CYSTOSCOPY;  Surgeon:  Reece Packer, MD;  [facility];  Service: Urology;  Laterality: N/A;  . EYE SURGERY Bilateral 06/18/2017   cataract sx with lens implant  . HERNIA REPAIR  2013  . HERNIA REPAIR Right    age  23  . LUMBAR LAMINECTOMY/DECOMPRESSION MICRODISCECTOMY N/A 11/09/2013   Procedure: Lumbar Laminectomy/Decompression, Microdiscectomy Lumbar Three-Four, Four-Five, with Coflex;  Surgeon: Faythe Ghee, MD;  [facility];  Service: Neurosurgery;  Laterality: N/A;  Lumbar Laminectomy/Decompression, Microdiscectomy Lumbar Three-Four, Four-Five, with Coflex  . PAROTIDECTOMY Left 11/17/2015   Procedure: LEFT PAROTIDECTOMY;  Surgeon: Izora Gala, MD;  Location: [facility];  Service: ENT;  Laterality: Left;  . PROSTATE SURGERY  2013  . TONSILLECTOMY    . trigger thumb  1980   right    Family History  Problem  Relation Age of Onset  . Hypertension Unknown   . Cancer Mother        bone?  . Myasthenia gravis Father   . Heart disease Brother     Allergies  Allergen Reactions  . Penicillins Swelling    CHILDHOOD ALLERGY Has patient had a PCN reaction causing immediate rash, facial/tongue/throat swelling, SOB or lightheadedness with hypotension: Yes Has patient had a PCN reaction causing severe rash involving mucus membranes or skin necrosis: No Has patient had a PCN reaction that required hospitalization: No Has patient had a PCN reaction occurring within the last 10 years: No If all of the above answers are "NO", then may proceed with Cephalosporin use.     Current Outpatient Medications on File Prior to Visit  Medication Sig Dispense Refill  . atorvastatin (LIPITOR) 10 MG tablet Take 1 tablet (10 mg total) by mouth daily. 90 tablet 2  . losartan (COZAAR) 25 MG tablet Take 1 tablet by mouth  daily 90 tablet 1  . Multiple Vitamins-Minerals (PRESERVISION AREDS 2 PO) Take by mouth.    . [DISCONTINUED] terazosin (HYTRIN) 1 MG capsule take 1 capsule by mouth once daily 30 capsule 0   No current facility-administered medications on file prior to visit.     BP 130/60 (BP Location: Left Arm, Patient Position: Sitting, Cuff Size: Normal)   Pulse (!) 43   Temp 97.6 F (36.4 C) (Oral)   Wt 231 lb 12.8 oz (105.1 kg)   SpO2 96%   BMI 35.25 kg/m       Objective:   Physical Exam  Constitutional: He is oriented to person, place, and time. He appears well-developed and well-nourished. No distress.  HENT:  Nose: Nose normal. No mucosal edema. Right sinus exhibits no maxillary sinus tenderness and no frontal sinus tenderness. Left sinus exhibits no maxillary sinus tenderness and no frontal sinus tenderness.  + PND    Eyes: Pupils are equal, round, and reactive to light. EOM are normal.  Cardiovascular: Normal rate, regular rhythm, normal heart sounds and intact distal pulses.  Pulmonary/Chest:  Effort normal. He has wheezes (trace throughout ).  Neurological: He is alert and oriented to person, place, and time.  Skin: He is not diaphoretic.  Psychiatric: He has a normal mood and affect. His behavior is normal. Judgment and thought content normal.  Nursing note and vitals reviewed.     Assessment & Plan:  1. Cough - predniSONE (DELTASONE) 10 MG tablet; 40 mg x 3 days, 20 mg x 3 days, 10 mg x 3 days  Dispense: 21 tablet; Refill: 0 - fluticasone (FLONASE) 50 MCG/ACT nasal spray; Place 2 sprays into both nostrils daily.  Dispense: 16 g; Refill: 6 - Follow up if no improvement in 2-3 days   [name], NP

## 2018-08-03 DIAGNOSIS — R69 Illness, unspecified: Secondary | ICD-10-CM | POA: Diagnosis not present

## 2018-08-14 ENCOUNTER — Ambulatory Visit (INDEPENDENT_AMBULATORY_CARE_PROVIDER_SITE_OTHER): Payer: Medicare HMO | Admitting: Family Medicine

## 2018-08-14 ENCOUNTER — Encounter: Payer: Self-pay | Admitting: Family Medicine

## 2018-08-14 VITALS — BP 120/80 | HR 49 | Temp 98.0°F | Ht 68.0 in | Wt 235.5 lb

## 2018-08-14 DIAGNOSIS — M62838 Other muscle spasm: Secondary | ICD-10-CM | POA: Diagnosis not present

## 2018-08-14 DIAGNOSIS — M79602 Pain in left arm: Secondary | ICD-10-CM | POA: Diagnosis not present

## 2018-08-14 DIAGNOSIS — M542 Cervicalgia: Secondary | ICD-10-CM | POA: Diagnosis not present

## 2018-08-14 MED ORDER — TIZANIDINE HCL 2 MG PO TABS: 2.0000 mg | ORAL_TABLET | Freq: Two times a day (BID) | ORAL | 0 refills | Status: DC | PRN

## 2018-08-14 NOTE — Patient Instructions (Signed)
BEFORE YOU LEAVE: -follow up: 2 weeks  Do the exercises we discussed daily.   Do the postural neck strengthening exercises daily: -stand with heals, buttocks, shoulder blades and head against wall -press back against wall gently with your head for 5 seconds, then against counter pressure with finger tips: to the right for 5 seconds, forward for 5 seconds, then to the left for 5 second (keep head straight and against wall the entire time) -repeat 10 times  Tizanidine (muscle relaxer) at night or as needed per instructions  Heat or Ice, Tiger balm, Tylenol (500-1000mg  up to 3 times per day) as needed for pain  I hope you are feeling better soon! Seek care sooner if your symptoms worsen, new concerns arise or you are not improving with treatment.

## 2018-08-14 NOTE — Progress Notes (Signed)
HPI:  Using dictation device. Unfortunately this device frequently misinterprets words/phrases.  Acute visit for L shoulder/neck pain -started about 1-1.5 weeks ago after a flu shot -pain is in L neck and radiates to L shoulder and L Lat upper arm -certain movements seem to trigger - feels like a "nerve", pain, mild to mod -better with tylenol, take once at night at times -no ha, weakness, numbness, fevers, malaise, swelling, redness, trauma -denies hx OA, DD or prior issues -has hx CTS   ROS: See pertinent positives and negatives per HPI.  Past Medical History:  Diagnosis Date  . GERD (gastroesophageal reflux disease)   . Hyperlipidemia   . Hypertension    EKG and chest x ray 8/12 [facility]/states had stress test 2 yrs ago- doesnt remember where- not in [facility]  . Spinal stenosis    lumbar    Past Surgical History:  Procedure Laterality Date  . COLONOSCOPY    . CYSTOSCOPY  12/30/2011   Procedure: CYSTOSCOPY;  Surgeon: Reece Packer, MD;  [facility];  Service: Urology;  Laterality: N/A;  . EYE SURGERY Bilateral 06/18/2017   cataract sx with lens implant  . HERNIA REPAIR  2013  . HERNIA REPAIR Right    age  84  . LUMBAR LAMINECTOMY/DECOMPRESSION MICRODISCECTOMY N/A 11/09/2013   Procedure: Lumbar Laminectomy/Decompression, Microdiscectomy Lumbar Three-Four, Four-Five, with Coflex;  Surgeon: Faythe Ghee, MD;  Location: [facility];  Service: Neurosurgery;  Laterality: N/A;  Lumbar Laminectomy/Decompression, Microdiscectomy Lumbar Three-Four, Four-Five, with Coflex  . PAROTIDECTOMY Left 11/17/2015   Procedure: LEFT PAROTIDECTOMY;  Surgeon: Izora Gala, MD;  Location: [facility];  Service: ENT;  Laterality: Left;  . PROSTATE SURGERY  2013  . TONSILLECTOMY    . trigger thumb  1980   right    Family History  Problem Relation Age of Onset  . Hypertension Unknown   . Cancer Mother        bone?  . Myasthenia gravis Father   . Heart disease Brother      SOCIAL HX: see hpi   Current Outpatient Medications:  .  atorvastatin (LIPITOR) 10 MG tablet, Take 1 tablet (10 mg total) by mouth daily., Disp: 90 tablet, Rfl: 2 .  fluticasone (FLONASE) 50 MCG/ACT nasal spray, Place 2 sprays into both nostrils daily., Disp: 16 g, Rfl: 6 .  losartan (COZAAR) 25 MG tablet, Take 1 tablet by mouth  daily, Disp: 90 tablet, Rfl: 1 .  Multiple Vitamins-Minerals (PRESERVISION AREDS 2 PO), Take by mouth., Disp: , Rfl:  .  predniSONE (DELTASONE) 10 MG tablet, 40 mg x 3 days, 20 mg x 3 days, 10 mg x 3 days, Disp: 21 tablet, Rfl: 0 .  tiZANidine (ZANAFLEX) 2 MG tablet, Take 1 tablet (2 mg total) by mouth every 12 (twelve) hours as needed for muscle spasms., Disp: 30 tablet, Rfl: 0  EXAM:  Vitals:   08/14/18 0800  BP: 120/80  Pulse: (!) 49  Temp: 98 F (36.7 C)    Body mass index is 35.81 kg/m.  GENERAL: vitals reviewed and listed above, alert, oriented, appears well hydrated and in no acute distress  HEENT: atraumatic, conjunttiva clear, no obvious abnormalities on inspection of external nose and ears  NECK: no obvious masses on inspection  LUNGS: clear to auscultation bilaterally, no wheezes, rales or rhonchi, good air movement  CV: HRRR, no peripheral edema  MS: moves all extremities without noticeable abnormality, poor posture with head forward 2 cm, TTP post pedicle/ paraspinal cervical  region C5-6 L post, normal ROM head and neck, no other TTP, normal strength/sensitivity/DTRs throughout in bilat upper ext, neg impingement testing L shoulder, neg spurling  PSYCH: pleasant and cooperative, no obvious depression or anxiety  ASSESSMENT AND PLAN:  Discussed the following assessment and plan:  Neck pain  Pain of left upper extremity  Muscle spasm  -we discussed possible serious and likely etiologies, workup and treatment, treatment risks and return precautions; suspect mild cervical radicular symptoms -after this discussion, Glenn Ortega opted  for HEP - demonstrated, postural exercises - demonstrated, muscle relaxer/symptmatic care per instructions, consideration imaging, PT, prednisone if not improving -follow up advised 2-3 weeks -of course, we advised Glenn Ortega  to return or notify a doctor immediately if symptoms worsen or persist or new concerns arise.   Patient Instructions  BEFORE YOU LEAVE: -follow up: 2 weeks  Do the exercises we discussed daily.   Do the postural neck strengthening exercises daily: -stand with heals, buttocks, shoulder blades and head against wall -press back against wall gently with your head for 5 seconds, then against counter pressure with finger tips: to the right for 5 seconds, forward for 5 seconds, then to the left for 5 second (keep head straight and against wall the entire time) -repeat 10 times  Tizanidine (muscle relaxer) at night or as needed per instructions  Heat or Ice, Tiger balm, Tylenol (500-1000mg  up to 3 times per day) as needed for pain  I hope you are feeling better soon! Seek care sooner if your symptoms worsen, new concerns arise or you are not improving with treatment.       Glenn Kern, DO

## 2018-08-23 ENCOUNTER — Encounter: Payer: Self-pay | Admitting: Family Medicine

## 2018-08-23 ENCOUNTER — Ambulatory Visit (INDEPENDENT_AMBULATORY_CARE_PROVIDER_SITE_OTHER): Payer: Medicare HMO

## 2018-08-23 ENCOUNTER — Ambulatory Visit (INDEPENDENT_AMBULATORY_CARE_PROVIDER_SITE_OTHER): Payer: Medicare HMO | Admitting: Family Medicine

## 2018-08-23 ENCOUNTER — Telehealth: Payer: Self-pay | Admitting: Family Medicine

## 2018-08-23 VITALS — BP 119/62 | HR 65 | Wt 236.0 lb

## 2018-08-23 DIAGNOSIS — M4682 Other specified inflammatory spondylopathies, cervical region: Secondary | ICD-10-CM

## 2018-08-23 DIAGNOSIS — M5412 Radiculopathy, cervical region: Secondary | ICD-10-CM

## 2018-08-23 MED ORDER — GABAPENTIN 300 MG PO CAPS
ORAL_CAPSULE | ORAL | 3 refills | Status: DC
Start: 2018-08-23 — End: 2018-09-27

## 2018-08-23 MED ORDER — PREDNISONE 5 MG (48) PO TBPK: ORAL_TABLET | ORAL | 0 refills | Status: DC

## 2018-08-23 NOTE — Progress Notes (Signed)
Glenn Ortega is a 84 y.o. male who presents to [facility] today for radiating arm pain.  Glenn Ortega was in his normal state of health about 3 days ago at work.  He lifted a heavy bale and felt immediate pain radiating down his arms bilaterally.  He notes his arm pain on his right is worse than his left.  He notes pain radiating from his posterior neck down to the second and third digits into his hand.  The pain is worse with activity and better with rest.  The pain is quite severe last night and woke up from sleep.  He took a leftover oxycodone which helped temporarily.  He denies any weakness or numbness.  He notes is a little bit better this morning than it was last night.  He rates his pain is severe at its worst.  He denies any history of cervical radiculopathy in the past.  He has a history of lumbar fusion.  He has a CT myelogram of his cervical lumbar spine from 2014 that showed some degenerative changes in the cervical spine.     ROS:  As above  Exam:  BP 119/62   Pulse 65   Wt 236 lb (107 kg)   BMI 35.88 kg/m  General: Well Developed, well nourished, and in no acute distress.  Neuro/Psych: Alert and oriented x3, extra-ocular muscles intact, able to move all 4 extremities, sensation grossly intact. Skin: Warm and dry, no rashes noted.  Respiratory: Not using accessory muscles, speaking in full sentences, trachea midline.  Cardiovascular: Pulses palpable, no extremity edema. Abdomen: Does not appear distended. MSK:  C-spine: Nontender to spinal midline.  Decreased cervical motion without significant pain.  Negative Spurling's test. Upper extremity reflexes are equal normal bilaterally. Upper extremity strength is equal and normal and intact bilaterally with the exception of left shoulder abduction which is 4/5. Sensation is intact throughout. Pulses are intact bilateral upper extremities.  Right shoulder nontender normal motion  negative impingement testing.    Lab and Radiology Results  X-ray C-spine images personally independently reviewed Mild degenerative changes.  No acute fractures.  Stable appearance from CT scan C-spine dated 2014. Await formal radiology review    TECHNIQUE: Contiguous axial images were obtained through the Cervical and Lumbar spine after the intrathecal infusion of infusion. Coronal and sagittal reconstructions were obtained of the axial image sets.  FLUOROSCOPY TIME:  1 min 29 seconds  PROCEDURE: LUMBAR PUNCTURE FOR CERVICAL AND LUMBAR MYELOGRAM  CERVICAL AND LUMBAR MYELOGRAM  CT CERVICAL MYELOGRAM  CT LUMBAR MYELOGRAM  After thorough discussion of risks and benefits of the procedure including bleeding, infection, injury to nerves, blood vessels, adjacent structures as well as headache and CSF leak, written and oral informed consent was obtained. Consent was obtained by Dr. Lawrence Ortega.  Patient was positioned prone on the fluoroscopy table. Local anesthesia was provided with 1% lidocaine without epinephrine after prepped and draped in the usual sterile fashion. Puncture was performed at L1-2 using a 3 1/2 inch 22-gauge spinal needle via left paramedian approach. Using a single pass through the dura, the needle was placed within the thecal sac, with return of clear CSF. 10 mL of Omnipaque-300 was injected into the thecal sac, with normal opacification of the nerve roots and cauda equina consistent with free flow within the subarachnoid space. The patient was then moved to the trendelenburg position and contrast flowed into the Cervical spine region.  I personally performed the lumbar puncture  and administered the intrathecal contrast. I also personally supervised acquisition of the myelogram images.  FINDINGS: CERVICAL AND LUMBAR MYELOGRAM FINDINGS:  Imaging of the lumbar spine demonstrates a near total block of contrast at the L3-4 level suggesting  a large disc herniation. Despite standing the patient up, flexion, and extension imaging, very little contrast is seen below the L3-4 level.  A broad-based disc herniation at L1-2 leads to left greater than right lateral recess narrowing.  The upright images demonstrate exaggeration of the T12-L1 and L1-2 disc herniations. Alignment is maintained through flexion and extension. The contrast block at L3-4 is again noted.  Cervical spine imaging demonstrates some blunting of right-sided nerve roots at C4-5 and C5-6. There is some ventral impression at both levels with a disc osteophyte complex. There is some but obscured by the shoulders.  CT CERVICAL MYELOGRAM FINDINGS:  The cervical spine is imaged from the skull base through T1-2. There is soft tissue prominence of the ligament surrounding the dens partially effacing the ventral CSF at the C1 level. The craniocervical junction is otherwise within normal limits.  Vertebra the heights and alignment are maintained. The soft tissues of the neck are unremarkable.  C2-3: A central disc protrusion effaces the ventral CSF. The foramina are patent bilaterally.  C3-4: A rightward disc osteophyte complex effaces the ventral CSF and likely contacts the ventral surface of the cord. The canal is narrowed to 8 mm. Mild facet and uncovertebral spurring contribute to slight right-sided foraminal narrowing.  C4-5: A broad-based disc osteophyte complex is present. There is some flattening of the right side of the cord. The canal measures 9 mm in the midline. Uncovertebral spurring contributes to moderate right and mild left foraminal stenosis.  C5-6: The central canal is patent. Uncovertebral spurring contributes to mild foraminal narrowing bilaterally, worse on the right.  C6-7:  No significant herniation or stenosis is present.  C7-T1: Mild facet hypertrophy is present. There is no significant stenosis.  CT LUMBAR  MYELOGRAM FINDINGS:  The conus medullaris terminates at L1, within normal limits. Short pedicles are present throughout the lumbar spine. A superior endplate fracture at L5 is new since the prior exam. The vertebral body height is narrowed to 16 mm compared with the 24 mm on the prior exam. The remaining vertebral body heights are maintained. Slight retrolisthesis at L3-4 is stable.  Limited imaging of the abdomen demonstrates minimal atherosclerotic calcifications without aneurysm.  T12-L1: A left paracentral disc protrusion is again noted. There is no significant stenosis.  L1-2: A broad-based leftward disc herniation is present. Moderate left lateral recess narrowing is similar to the prior exam. Mild foraminal narrowing is evident bilaterally.  L2-3: A mild broad-based disc herniation is present. Facet hypertrophy, ligamentum flavum thickening, and short pedicles contribute to minimal central and foraminal narrowing.  L3-4: A large central disc protrusion essentially obliterates the central canal. Mild foraminal narrowing is evident bilaterally.  L4-5: There is some contrast posterior to the L4 vertebral body. A broad-based disc herniation and concert with advanced facet hypertrophy and ligamentum flavum thickening results in severe central canal stenosis. Mild to moderate foraminal stenosis is present bilaterally. The central canal stenosis is exacerbated by slight retropulsion of bone from the L5 superior endplate fracture.  L5-S1: A focal right paramedian disc protrusion is again seen with mild to moderate right lateral recess narrowing. Minimal foraminal stenosis is present bilaterally.  IMPRESSION: 1. Effacement of the ventral CSF at C1 and C2-3 as described. 2. Moderate right central canal stenosis  at C3-4 and to a greater extent at C4-5. 3. Moderate right and mild left foraminal narrowing at C4-5. 4. Mild foraminal narrowing bilaterally at C5-6 is worse  on the right. 5. Severe central canal stenosis at L3-4 with a large central disc protrusion blocking the central canal. There is minimal contrast below this level. 6. Severe central canal stenosis at L4-5 secondary to a broad-based disc herniation, facet hypertrophy, and ligamentum flavum thickening. 7. Moderate left lateral recess stenosis at L1-2 is stable. 8. Minimal central and foraminal narrowing bilaterally at L2-3. 9. Mild foraminal narrowing bilaterally at L1-2 and L3-4. 10. Mild to moderate foraminal stenosis bilaterally at L4-5. 11. Right paramedian disc protrusion at L5-S1 with mild to moderate right lateral recess narrowing.   Electronically Signed   By: Glenn Ortega M.D.   On: 10/02/2013 13:36  I personally (independently) visualized and performed the interpretation of the images attached in this note.   Assessment and Plan: 84 y.o. male with cervical radiculopathy at C7 right much worse than left.  Patient has intact strength.  Plan for x-ray as above, trial of prednisone taper, and gabapentin.  Recheck if not improving.  Would consider advanced imaging if worsening or not improving in preparation for epidural steroid injection.    Orders Placed This Encounter  Procedures  . DG Cervical Spine Complete    Standing Status:   Future    Number of Occurrences:   1    Standing Expiration Date:   10/24/2019    Order Specific Question:   Reason for Exam (SYMPTOM  OR DIAGNOSIS REQUIRED)    Answer:   C7 radicopathy BL L>R 3 days no injury    Order Specific Question:   Preferred imaging location?    Answer:   [facility]    Order Specific Question:   Radiology Contrast Protocol - do NOT remove file path    Answer:   [email or web address]   Meds ordered this encounter  Medications  . predniSONE (STERAPRED UNI-PAK 48 TAB) 5 MG (48) TBPK tablet    Sig: 12 day dosepack po    Dispense:  48 tablet    Refill:  0  .  gabapentin (NEURONTIN) 300 MG capsule    Sig: One tab PO qHS for a week, then BID for a week, then TID. May double weekly to a max of 3,600mg /day    Dispense:  180 capsule    Refill:  3    Historical information moved to improve visibility of documentation.  Past Medical History:  Diagnosis Date  . GERD (gastroesophageal reflux disease)   . Hyperlipidemia   . Hypertension    EKG and chest x ray 8/12 [facility]/states had stress test 2 yrs ago- doesnt remember where- not in [facility]  . Spinal stenosis    lumbar   Past Surgical History:  Procedure Laterality Date  . COLONOSCOPY    . CYSTOSCOPY  12/30/2011   Procedure: CYSTOSCOPY;  Surgeon: Reece Packer, MD;  Location: [facility];  Service: Urology;  Laterality: N/A;  . EYE SURGERY Bilateral 06/18/2017   cataract sx with lens implant  . HERNIA REPAIR  2013  . HERNIA REPAIR Right    age  26  . LUMBAR LAMINECTOMY/DECOMPRESSION MICRODISCECTOMY N/A 11/09/2013   Procedure: Lumbar Laminectomy/Decompression, Microdiscectomy Lumbar Three-Four, Four-Five, with Coflex;  Surgeon: Faythe Ghee, MD;  Location: [facility];  Service: Neurosurgery;  Laterality: N/A;  Lumbar Laminectomy/Decompression, Microdiscectomy Lumbar Three-Four, Four-Five, with Coflex  . PAROTIDECTOMY Left 11/17/2015   Procedure:  LEFT PAROTIDECTOMY;  Surgeon: Izora Gala, MD;  Location: [facility];  Service: ENT;  Laterality: Left;  . PROSTATE SURGERY  2013  . TONSILLECTOMY    . trigger thumb  1980   right   Social History   Tobacco Use  . Smoking status: Never Smoker  . Smokeless tobacco: Never Used  . Tobacco comment: per patient he never smoke. 11/25/2014 /rc  Substance Use Topics  . Alcohol use: Yes    Alcohol/week: 2.0 standard drinks    Types: 2 Standard drinks or equivalent per week    Comment: socially- occ beer   family history includes Cancer in his mother; Heart disease in his brother; Hypertension in his unknown relative; Myasthenia gravis in his  father.  Medications: Current Outpatient Medications  Medication Sig Dispense Refill  . atorvastatin (LIPITOR) 10 MG tablet Take 1 tablet (10 mg total) by mouth daily. 90 tablet 2  . fluticasone (FLONASE) 50 MCG/ACT nasal spray Place 2 sprays into both nostrils daily. 16 g 6  . losartan (COZAAR) 25 MG tablet Take 1 tablet by mouth  daily 90 tablet 1  . Multiple Vitamins-Minerals (PRESERVISION AREDS 2 PO) Take by mouth.    Marland Kitchen tiZANidine (ZANAFLEX) 2 MG tablet Take 1 tablet (2 mg total) by mouth every 12 (twelve) hours as needed for muscle spasms. 30 tablet 0  . gabapentin (NEURONTIN) 300 MG capsule One tab PO qHS for a week, then BID for a week, then TID. May double weekly to a max of 3,600mg /day 180 capsule 3  . predniSONE (STERAPRED UNI-PAK 48 TAB) 5 MG (48) TBPK tablet 12 day dosepack po 48 tablet 0   No current facility-administered medications for this visit.    Allergies  Allergen Reactions  . Penicillins Swelling    CHILDHOOD ALLERGY Has patient had a PCN reaction causing immediate rash, facial/tongue/throat swelling, SOB or lightheadedness with hypotension: Yes Has patient had a PCN reaction causing severe rash involving mucus membranes or skin necrosis: No Has patient had a PCN reaction that required hospitalization: No Has patient had a PCN reaction occurring within the last 10 years: No If all of the above answers are "NO", then may proceed with Cephalosporin use.       Discussed warning signs or symptoms. Please see discharge instructions. Patient expresses understanding.

## 2018-08-23 NOTE — Patient Instructions (Addendum)
Thank you for coming in today.  Take the prednisone.  Use gabapentin as needed for pain.  If not better let me know.  Come back or go to the emergency room if you notice new weakness new numbness problems walking or bowel or bladder problems.  Recheck if not improving.    Cervical Radiculopathy Cervical radiculopathy happens when a nerve in the neck (cervical nerve) is pinched or bruised. This condition can develop because of an injury or as part of the normal aging process. Pressure on the cervical nerves can cause pain or numbness that runs from the neck all the way down into the arm and fingers. Usually, this condition gets better with rest. Treatment may be needed if the condition does not improve. What are the causes? This condition may be caused by:  Injury.  Slipped (herniated) disk.  Muscle tightness in the neck because of overuse.  Arthritis.  Breakdown or degeneration in the bones and joints of the spine (spondylosis) due to aging.  Bone spurs that may develop near the cervical nerves.  What are the signs or symptoms? Symptoms of this condition include:  Pain that runs from the neck to the arm and hand. The pain can be severe or irritating. It may be worse when the neck is moved.  Numbness or weakness in the affected arm and hand.  How is this diagnosed? This condition may be diagnosed based on symptoms, medical history, and a physical exam. You may also have tests, including:  X-rays.  CT scan.  MRI.  Electromyogram (EMG).  Nerve conduction tests.  How is this treated? In many cases, treatment is not needed for this condition. With rest, the condition usually gets better over time. If treatment is needed, options may include:  Wearing a soft neck collar for short periods of time.  Physical therapy to strengthen your neck muscles.  Medicines, such as NSAIDs, oral corticosteroids, or spinal injections.  Surgery. This may be needed if other treatments  do not help. Various types of surgery may be done depending on the cause of your problems.  Follow these instructions at home: Managing pain  Take over-the-counter and prescription medicines only as told by your health care provider.  If directed, apply ice to the affected area. ? Put ice in a plastic bag. ? Place a towel between your skin and the bag. ? Leave the ice on for 20 minutes, 2-3 times per day.  If ice does not help, you can try using heat. Take a warm shower or warm bath, or use a heat pack as told by your health care provider.  Try a gentle neck and shoulder massage to help relieve symptoms. Activity  Rest as needed. Follow instructions from your health care provider about any restrictions on activities.  Do stretching and strengthening exercises as told by your health care provider or physical therapist. General instructions  If you were given a soft collar, wear it as told by your health care provider.  Use a flat pillow when you sleep.  Keep all follow-up visits as told by your health care provider. This is important. Contact a health care provider if:  Your condition does not improve with treatment. Get help right away if:  Your pain gets much worse and cannot be controlled with medicines.  You have weakness or numbness in your hand, arm, face, or leg.  You have a high fever.  You have a stiff, rigid neck.  You lose control of your bowels or  your bladder (have incontinence).  You have trouble with walking, balance, or speaking. This information is not intended to replace advice given to you by your health care provider. Make sure you discuss any questions you have with your health care provider. Document Released: 06/29/2001 Document Revised: 03/11/2016 Document Reviewed: 11/28/2014 Elsevier Interactive Patient Education  [employer].

## 2018-08-23 NOTE — Telephone Encounter (Signed)
Received fax from [employer] that Gabapentin requires a PA. Information has been sent to the insurance company. Awaiting determination.   

## 2018-08-24 NOTE — Telephone Encounter (Signed)
Received fax from [place] that Gabapentin has been approved through 10/17/2018.

## 2018-08-25 ENCOUNTER — Ambulatory Visit: Payer: Medicare HMO | Admitting: Adult Health

## 2018-08-29 ENCOUNTER — Ambulatory Visit: Payer: Medicare HMO | Admitting: Adult Health

## 2018-09-07 ENCOUNTER — Encounter: Payer: Medicare HMO | Admitting: Adult Health

## 2018-09-19 IMAGING — CT CT HEAD W/O CM
3 series · 16 of 47 positions shown, 19 images · non-contrast
Comparison: None.

CLINICAL DATA: Weakness, facial numbness.

EXAM:
CT HEAD WITHOUT CONTRAST
TECHNIQUE: Contiguous axial images were obtained from the base of the skull
through the vertex without intravenous contrast.

[Series 2: head wo · axial · 0.43mm/px · z∈[+884,+1014]mm · 10 of 32 slices shown, 13 images]
[im 3/32  brain]
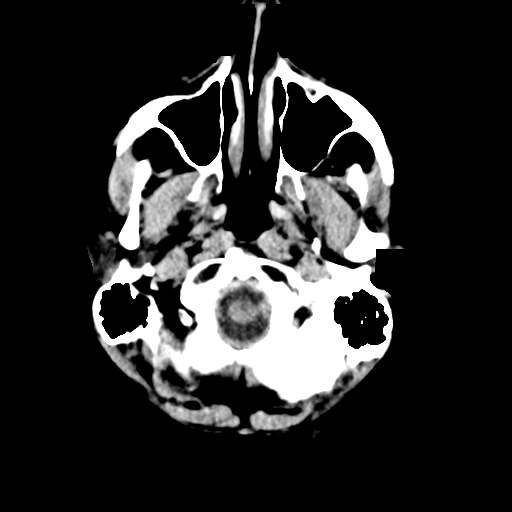
[im 3/32  bone]
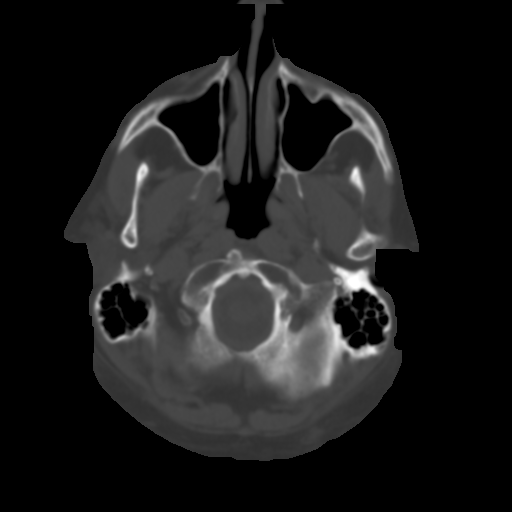
[im 6/32  brain]
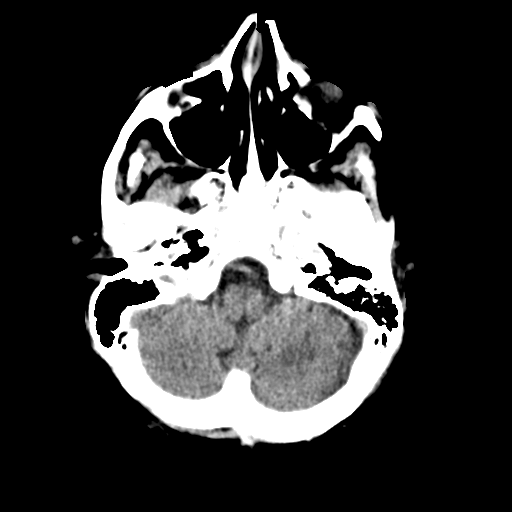
[im 9/32  brain]
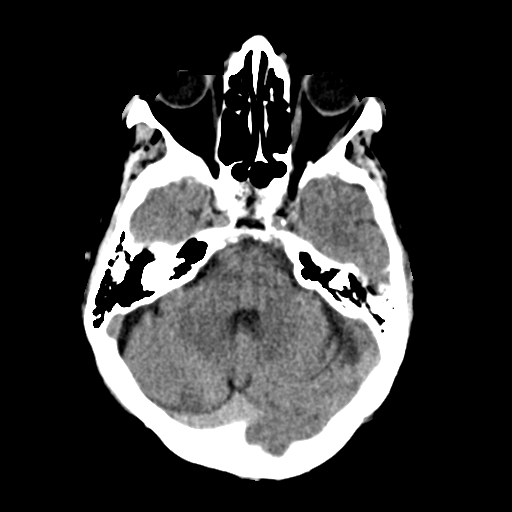
[im 11/32  brain]
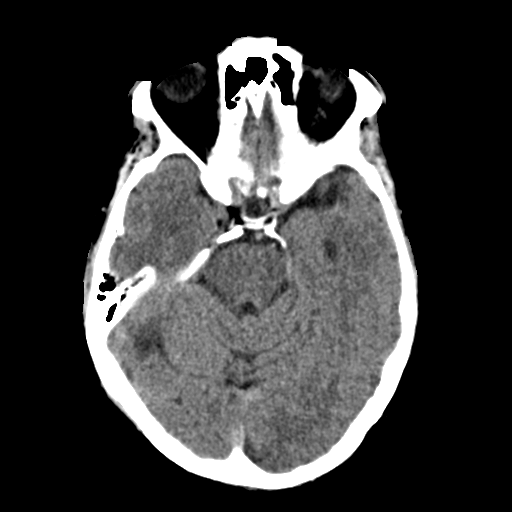
[im 14/32  brain]
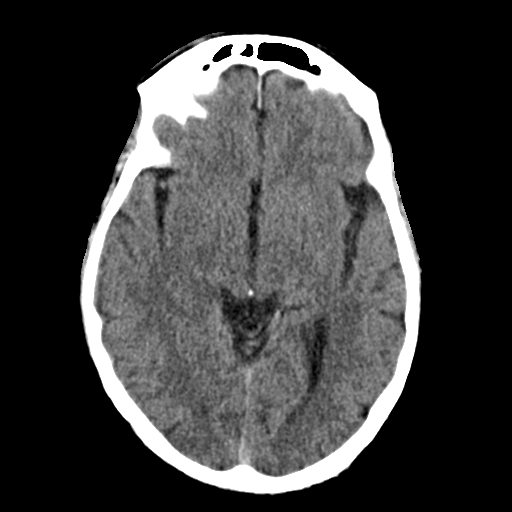
[im 14/32  bone]
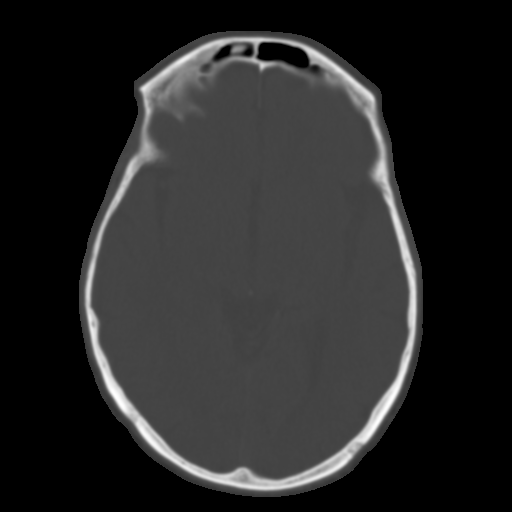
[im 18/32  brain]
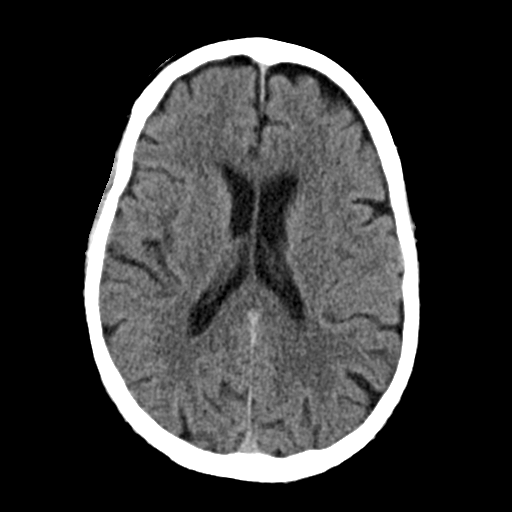
[im 21/32  brain]
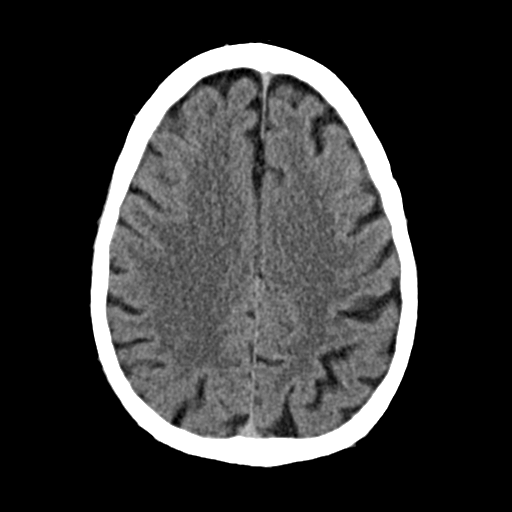
[im 24/32  brain]
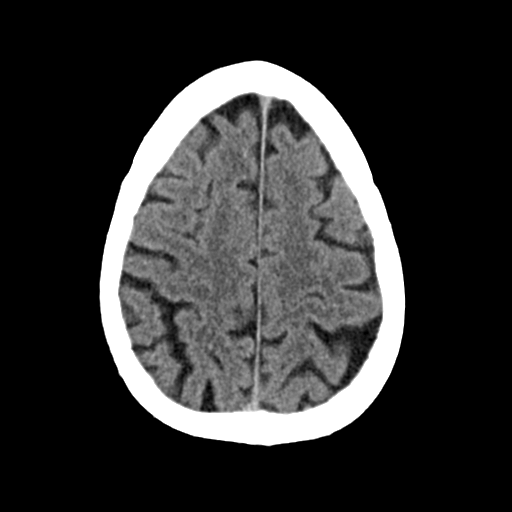
[im 26/32  brain]
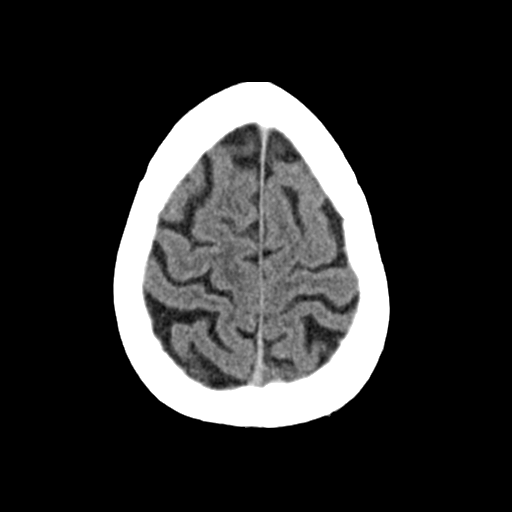
[im 26/32  bone]
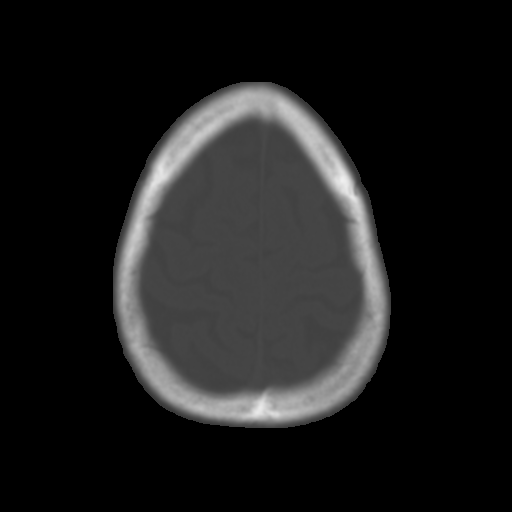
[im 29/32  brain]
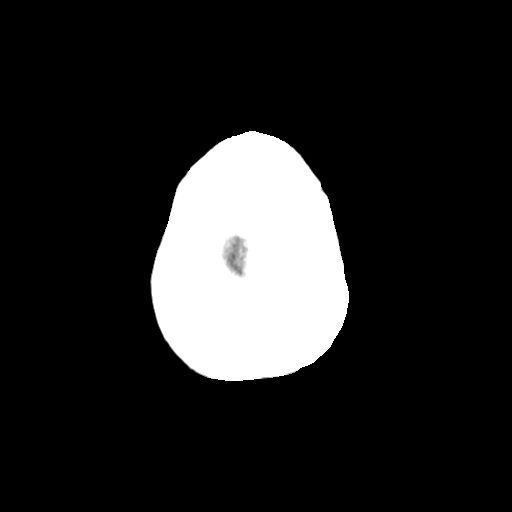

[Series 4: coronal soft · coronal · 0.31mm/px · 3 of 67 slices shown]
[im 23/67  brain]
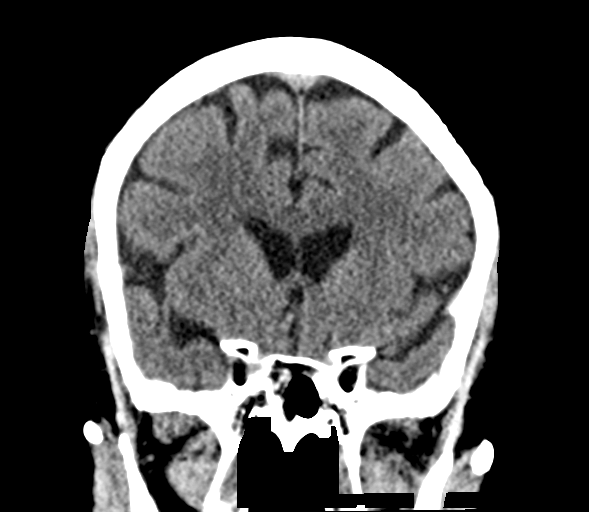
[im 30/67  brain]
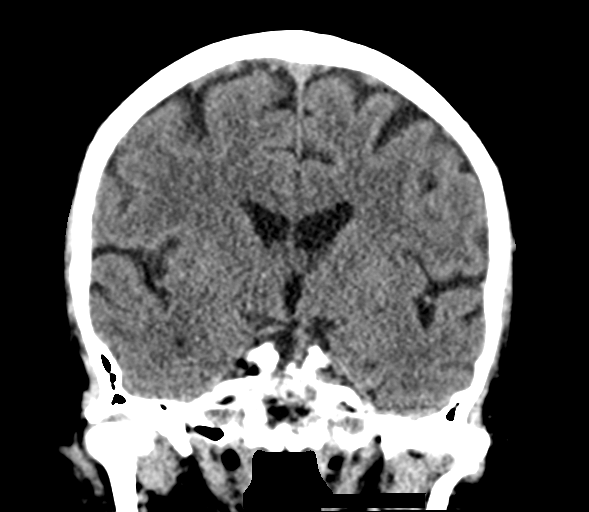
[im 37/67  brain]
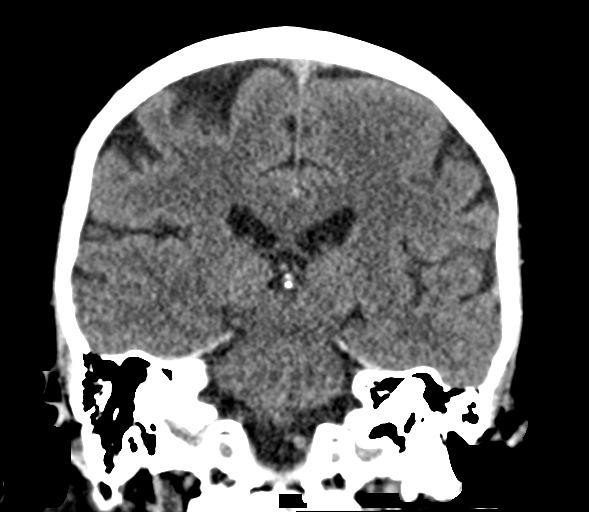

[Series 5: sag soft · sagittal · 0.31mm/px · 3 of 62 slices shown]
[im 21/62  brain]
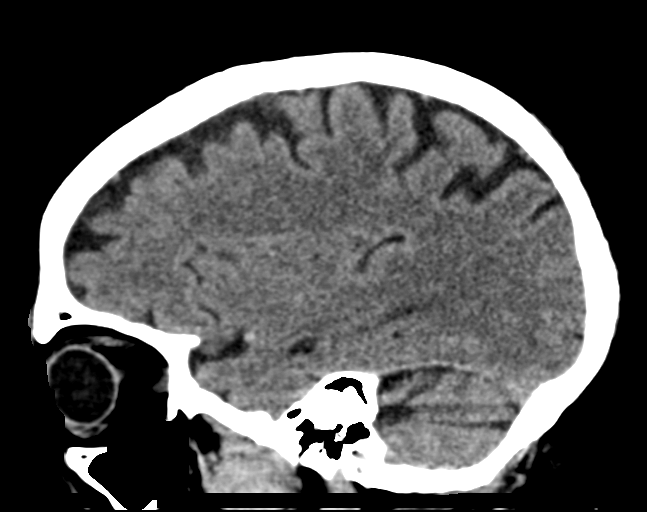
[im 31/62  brain]
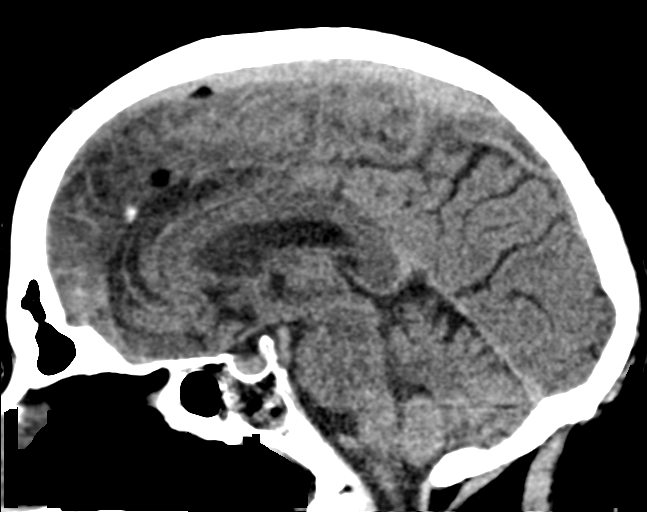
[im 41/62  brain]
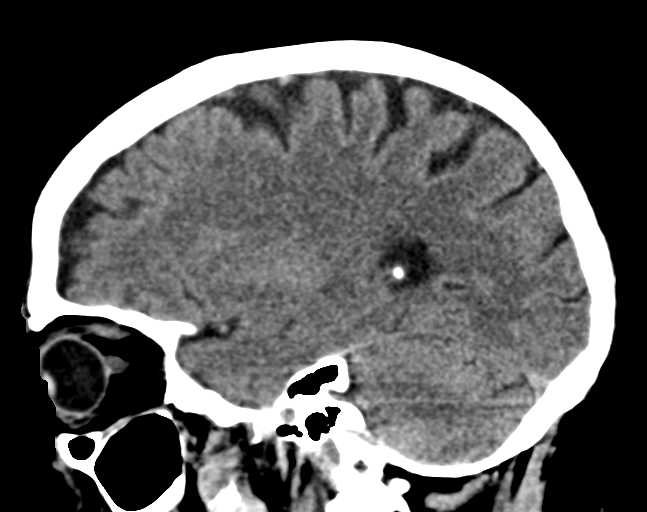

[16 of 47 positions shown; findings below may reference images not displayed]

FINDINGS: Brain: No evidence of acute infarction, hemorrhage, hydrocephalus,
extra-axial collection or mass lesion/mass effect.

Vascular: No hyperdense vessel or unexpected calcification.

Skull: Normal. Negative for fracture or focal lesion.

Sinuses/Orbits: No acute finding.

Other: None.
IMPRESSION: Normal head CT.

## 2018-09-27 ENCOUNTER — Ambulatory Visit (INDEPENDENT_AMBULATORY_CARE_PROVIDER_SITE_OTHER): Payer: Medicare HMO | Admitting: Adult Health

## 2018-09-27 ENCOUNTER — Encounter: Payer: Self-pay | Admitting: Adult Health

## 2018-09-27 VITALS — BP 130/78 | Temp 97.8°F | Ht 65.75 in | Wt 237.0 lb

## 2018-09-27 DIAGNOSIS — N400 Enlarged prostate without lower urinary tract symptoms: Secondary | ICD-10-CM | POA: Diagnosis not present

## 2018-09-27 DIAGNOSIS — M5412 Radiculopathy, cervical region: Secondary | ICD-10-CM | POA: Diagnosis not present

## 2018-09-27 DIAGNOSIS — I10 Essential (primary) hypertension: Secondary | ICD-10-CM

## 2018-09-27 DIAGNOSIS — E785 Hyperlipidemia, unspecified: Secondary | ICD-10-CM | POA: Insufficient documentation

## 2018-09-27 DIAGNOSIS — Z0000 Encounter for general adult medical examination without abnormal findings: Secondary | ICD-10-CM

## 2018-09-27 DIAGNOSIS — E782 Mixed hyperlipidemia: Secondary | ICD-10-CM | POA: Diagnosis not present

## 2018-09-27 LAB — LIPID PANEL
Cholesterol: 165 mg/dL (ref 0–200)
HDL: 61.7 mg/dL (ref >39.00)
LDL CALC: 82 mg/dL (ref 0–99)
NONHDL: 103.6
Total CHOL/HDL Ratio: 3
Triglycerides: 109 mg/dL (ref 0.0–149.0)
VLDL: 21.8 mg/dL (ref 0.0–40.0)

## 2018-09-27 LAB — HEPATIC FUNCTION PANEL
ALT: 14 U/L (ref 0–53)
AST: 15 U/L (ref 0–37)
Albumin: 4 g/dL (ref 3.5–5.2)
Alkaline Phosphatase: 37 U/L — ABNORMAL LOW (ref 39–117)
Bilirubin, Direct: 0.1 mg/dL (ref 0.0–0.3)
Total Bilirubin: 0.5 mg/dL (ref 0.2–1.2)
Total Protein: 6.7 g/dL (ref 6.0–8.3)

## 2018-09-27 LAB — CBC WITH DIFFERENTIAL/PLATELET
BASOS PCT: 0.5 % (ref 0.0–3.0)
Basophils Absolute: 0 10*3/uL (ref 0.0–0.1)
EOS PCT: 2.6 % (ref 0.0–5.0)
Eosinophils Absolute: 0.2 10*3/uL (ref 0.0–0.7)
HCT: 43.3 % (ref 39.0–52.0)
Hemoglobin: 14.7 g/dL (ref 13.0–17.0)
LYMPHS PCT: 22.2 % (ref 12.0–46.0)
Lymphs Abs: 1.7 10*3/uL (ref 0.7–4.0)
MCHC: 33.9 g/dL (ref 30.0–36.0)
MCV: 85.5 fl (ref 78.0–100.0)
MONO ABS: 0.7 10*3/uL (ref 0.1–1.0)
Monocytes Relative: 9.1 % (ref 3.0–12.0)
NEUTROS PCT: 65.6 % (ref 43.0–77.0)
Neutro Abs: 5.1 10*3/uL (ref 1.4–7.7)
PLATELETS: 242 10*3/uL (ref 150.0–400.0)
RBC: 5.06 Mil/uL (ref 4.22–5.81)
RDW: 15.1 % (ref 11.5–15.5)
WBC: 7.8 10*3/uL (ref 4.0–10.5)

## 2018-09-27 LAB — BASIC METABOLIC PANEL
BUN: 23 mg/dL (ref 6–23)
CALCIUM: 8.8 mg/dL (ref 8.4–10.5)
CHLORIDE: 104 meq/L (ref 96–112)
CO2: 29 mEq/L (ref 19–32)
CREATININE: 1.28 mg/dL (ref 0.40–1.50)
GFR: 57.15 mL/min — ABNORMAL LOW (ref >60.00)
Glucose, Bld: 104 mg/dL — ABNORMAL HIGH (ref 70–99)
Potassium: 4.1 mEq/L (ref 3.5–5.1)
SODIUM: 141 meq/L (ref 135–145)

## 2018-09-27 LAB — PSA: PSA: 0.03 ng/mL — ABNORMAL LOW (ref 0.10–4.00)

## 2018-09-27 LAB — TSH: TSH: 5.56 u[IU]/mL — AB (ref 0.35–4.50)

## 2018-09-27 MED ORDER — METHYLPREDNISOLONE 4 MG PO TBPK: ORAL_TABLET | ORAL | 0 refills | Status: DC

## 2018-09-27 NOTE — Progress Notes (Signed)
Subjective:    Patient ID: Glenn Ortega, male    DOB: 1934-05-15, 84 y.o.   MRN: [id number]  HPI Patient presents for yearly preventative medicine examination. He is a pleasant 84 year old male who  has a past medical history of GERD (gastroesophageal reflux disease), Hyperlipidemia, Hypertension, and Spinal stenosis.   He continues to work and is the main caregiver for his wife.   HTN - takes Cozaar 25 mg - stable BP Readings from Last 3 Encounters:  09/27/18 130/78  08/23/18 119/62  08/14/18 120/80   Hyperlipidemia - controlled with Lipitor 10 mg and ASA 81 mg  Lab Results  Component Value Date   CHOL 167 09/14/2017   HDL 65.20 09/14/2017   LDLCALC 79 09/14/2017   TRIG 116.0 09/14/2017   CHOLHDL 3 09/14/2017   H/O spinal stenosis - s/p lumbar laminectomy/decompression in 2015.  History of Spinal Stenosis -  Had lumbar laminectomy/decompression in 2015 by Dr. Hal Neer at [facility]. He is starting to have pain in the same area which prevents him from walking distances. He would like to go back and see him.   All immunizations and health maintenance protocols were reviewed with the patient and needed orders were placed.utd  Appropriate screening laboratory values were ordered for the patient including screening of hyperlipidemia, renal function and hepatic function.  Medication reconciliation,  past medical history, social history, problem list and allergies were reviewed in detail with the patient  Goals were established with regard to weight loss, exercise, and  diet in compliance with medications  End of life planning was discussed.  He is up-to-date on routine vision screens but does not participate in dental visits.  He is no longer in need of a colonoscopy due to age  He continues to have pain from cervical radiculopathy. He was seen in November by Dr. Sherene Sires and was prescribed prednisone dose pack, gabapentin, and muscle relaxer. He reports  that he was improving but picked up a heavy hay bale at work and his pain returned. This time he has numbness and tingling down his right arm.Feels as though he has some decreased grip strength in his right hand.    Review of Systems  Constitutional: Negative.   HENT: Negative.   Eyes: Negative.   Respiratory: Negative.   Cardiovascular: Negative.   Gastrointestinal: Negative.   Endocrine: Negative.   Genitourinary: Negative.   Musculoskeletal: Positive for arthralgias and back pain.  Skin: Negative.   Allergic/Immunologic: Negative.   Neurological: Positive for weakness and numbness.  Hematological: Negative.   Psychiatric/Behavioral: Negative.   All other systems reviewed and are negative.  Past Medical History:  Diagnosis Date  . GERD (gastroesophageal reflux disease)   . Hyperlipidemia   . Hypertension    EKG and chest x ray 8/12 [facility]/states had stress test 2 yrs ago- doesnt remember where- not in [facility]  . Spinal stenosis    lumbar    Social History   Socioeconomic History  . Marital status: Married    Spouse name: [name]  . Number of children: Not on file  . Years of education: Not on file  . Highest education level: Not on file  Occupational History  . Occupation: Estate agent: [employer]  . Financial resource strain: Not on file  . Food insecurity:    Worry: Not on file    Inability: Not on file  . Transportation needs:    Medical: Not  on file    Non-medical: Not on file  Tobacco Use  . Smoking status: Never Smoker  . Smokeless tobacco: Never Used  . Tobacco comment: per patient he never smoke. 11/25/2014 /rc  Substance and Sexual Activity  . Alcohol use: Yes    Alcohol/week: 2.0 standard drinks    Types: 2 Standard drinks or equivalent per week    Comment: socially- occ beer  . Drug use: No  . Sexual activity: Not Currently  Lifestyle  . Physical activity:    Days per week: Not on file    Minutes per session: Not on file    . Stress: Not on file  Relationships  . Social connections:    Talks on phone: Not on file    Gets together: Not on file    Attends religious service: Not on file    Active member of club or organization: Not on file    Attends meetings of clubs or organizations: Not on file    Relationship status: Not on file  . Intimate partner violence:    Fear of current or ex partner: Not on file    Emotionally abused: Not on file    Physically abused: Not on file    Forced sexual activity: Not on file  Other Topics Concern  . Not on file  Social History Narrative   Works 3 nights a week at [employer].    Previously worked as a Freight forwarder for [employer].   Married to [place]. 2 caffeinated drinks per day. Does not exercise he says secondary to back problems.  Lives in a one story home.     Has 2 children.  Education: some college. He is an is in [employer] [employer] from [place]    Past Surgical History:  Procedure Laterality Date  . COLONOSCOPY    . CYSTOSCOPY  12/30/2011   Procedure: CYSTOSCOPY;  Surgeon: Reece Packer, MD;  Location: [facility];  Service: Urology;  Laterality: N/A;  . EYE SURGERY Bilateral 06/18/2017   cataract sx with lens implant  . HERNIA REPAIR  2013  . HERNIA REPAIR Right    age  66  . LUMBAR LAMINECTOMY/DECOMPRESSION MICRODISCECTOMY N/A 11/09/2013   Procedure: Lumbar Laminectomy/Decompression, Microdiscectomy Lumbar Three-Four, Four-Five, with Coflex;  Surgeon: Faythe Ghee, MD;  Location: [facility];  Service: Neurosurgery;  Laterality: N/A;  Lumbar Laminectomy/Decompression, Microdiscectomy Lumbar Three-Four, Four-Five, with Coflex  . PAROTIDECTOMY Left 11/17/2015   Procedure: LEFT PAROTIDECTOMY;  Surgeon: Izora Gala, MD;  Location: [facility];  Service: ENT;  Laterality: Left;  . PROSTATE SURGERY  2013  . TONSILLECTOMY    . trigger thumb  1980   right    Family History  Problem Relation Age of Onset  . Hypertension Unknown   . Cancer  Mother        bone?  . Myasthenia gravis Father   . Heart disease Brother     Allergies  Allergen Reactions  . Penicillins Swelling    CHILDHOOD ALLERGY Has patient had a PCN reaction causing immediate rash, facial/tongue/throat swelling, SOB or lightheadedness with hypotension: Yes Has patient had a PCN reaction causing severe rash involving mucus membranes or skin necrosis: No Has patient had a PCN reaction that required hospitalization: No Has patient had a PCN reaction occurring within the last 10 years: No If all of the above answers are "NO", then may proceed with Cephalosporin use.   . Gabapentin     "MADE ME  FEEL CRAZY"    Current Outpatient Medications on File Prior to Visit  Medication Sig Dispense Refill  . atorvastatin (LIPITOR) 10 MG tablet Take 1 tablet (10 mg total) by mouth daily. 90 tablet 2  . fluticasone (FLONASE) 50 MCG/ACT nasal spray Place 2 sprays into both nostrils daily. 16 g 6  . losartan (COZAAR) 25 MG tablet Take 1 tablet by mouth  daily 90 tablet 1  . Multiple Vitamins-Minerals (PRESERVISION AREDS 2 PO) Take by mouth.    . [DISCONTINUED] terazosin (HYTRIN) 1 MG capsule take 1 capsule by mouth once daily 30 capsule 0   No current facility-administered medications on file prior to visit.     BP 130/78   Temp 97.8 F (36.6 C)   Ht 5' 5.75" (1.67 m)   Wt 237 lb (107.5 kg)   BMI 38.54 kg/m       Objective:   Physical Exam  Constitutional: He is oriented to person, place, and time. He appears well-developed and well-nourished. No distress.  Obese   HENT:  Head: Normocephalic and atraumatic.  Right Ear: External ear normal.  Left Ear: External ear normal.  Nose: Nose normal.  Mouth/Throat: Oropharynx is clear and moist. No oropharyngeal exudate.  Eyes: Pupils are equal, round, and reactive to light. Conjunctivae and EOM are normal. Right eye exhibits no discharge. Left eye exhibits no discharge. No scleral icterus.  Neck: Normal range of  motion. Neck supple. No JVD present. No tracheal deviation present. No thyromegaly present.  Cardiovascular: Normal rate, regular rhythm, normal heart sounds and intact distal pulses. Exam reveals no gallop and no friction rub.  No murmur heard. Pulmonary/Chest: Effort normal and breath sounds normal. No stridor. No respiratory distress. He has no wheezes. He has no rales. He exhibits no tenderness.  Abdominal: Soft. Bowel sounds are normal. He exhibits no distension and no mass. There is no tenderness. There is no rebound and no guarding. No hernia.  Musculoskeletal: Normal range of motion. He exhibits no edema, tenderness or deformity.  Lymphadenopathy:    He has no cervical adenopathy.  Neurological: He is alert and oriented to person, place, and time. He displays normal reflexes. No cranial nerve deficit or sensory deficit. He exhibits normal muscle tone. Coordination normal.  5/5 grip strength bilaterally.  Tenderness with palpation along bilateral scapula  Skin: Skin is warm and dry. Capillary refill takes less than 2 seconds. No rash noted. He is not diaphoretic. No erythema. No pallor.  Psychiatric: He has a normal mood and affect. His behavior is normal. Judgment and thought content normal.  Nursing note and vitals reviewed.     Assessment & Plan:  1. Routine general medical examination at a health care facility - I would like him to find time for himself.  - Work on diet and exercise to lose weight  - Follow up in one year or sooner if needed - Basic metabolic panel - CBC with Differential/Platelet - Hepatic function panel - Lipid panel - TSH  2. HYPERTENSION, BENIGN - Well controlled. No change in medication  - Basic metabolic panel - CBC with Differential/Platelet - Hepatic function panel - Lipid panel - TSH  3. Benign prostatic hyperplasia without lower urinary tract symptoms  - PSA  4. Mixed hyperlipidemia - Consider increase in statin  - Basic metabolic  panel - CBC with Differential/Platelet - Hepatic function panel - Lipid panel - TSH  5. Cervical radiculopathy at C7 - methylPREDNISolone (MEDROL DOSEPAK) 4 MG TBPK tablet; Take as directed  Dispense: 21 tablet; Refill: 0 - Follow up if not resolved   [name], NP

## 2018-10-03 ENCOUNTER — Other Ambulatory Visit: Payer: Self-pay | Admitting: Adult Health

## 2018-10-03 DIAGNOSIS — E785 Hyperlipidemia, unspecified: Secondary | ICD-10-CM

## 2018-10-03 NOTE — Telephone Encounter (Signed)
Sent to the pharmacy by e-scribe. 

## 2018-10-09 ENCOUNTER — Other Ambulatory Visit: Payer: Self-pay | Admitting: Adult Health

## 2018-10-10 ENCOUNTER — Other Ambulatory Visit: Payer: Self-pay | Admitting: Family Medicine

## 2018-10-10 MED ORDER — LEVOTHYROXINE SODIUM 50 MCG PO TABS: 50.0000 ug | ORAL_TABLET | Freq: Every day | ORAL | 0 refills | Status: DC

## 2018-10-10 NOTE — Telephone Encounter (Signed)
Sent to the pharmacy by e-scribe. 

## 2018-10-16 ENCOUNTER — Ambulatory Visit: Payer: PPO | Admitting: *Deleted

## 2018-10-20 DIAGNOSIS — D2272 Melanocytic nevi of left lower limb, including hip: Secondary | ICD-10-CM | POA: Diagnosis not present

## 2018-10-20 DIAGNOSIS — L570 Actinic keratosis: Secondary | ICD-10-CM | POA: Diagnosis not present

## 2018-10-20 DIAGNOSIS — L438 Other lichen planus: Secondary | ICD-10-CM | POA: Diagnosis not present

## 2018-10-20 DIAGNOSIS — Z85828 Personal history of other malignant neoplasm of skin: Secondary | ICD-10-CM | POA: Diagnosis not present

## 2018-10-20 DIAGNOSIS — L821 Other seborrheic keratosis: Secondary | ICD-10-CM | POA: Diagnosis not present

## 2018-10-20 DIAGNOSIS — D2261 Melanocytic nevi of right upper limb, including shoulder: Secondary | ICD-10-CM | POA: Diagnosis not present

## 2018-10-20 DIAGNOSIS — L814 Other melanin hyperpigmentation: Secondary | ICD-10-CM | POA: Diagnosis not present

## 2018-10-20 DIAGNOSIS — D225 Melanocytic nevi of trunk: Secondary | ICD-10-CM | POA: Diagnosis not present

## 2018-10-20 DIAGNOSIS — D485 Neoplasm of uncertain behavior of skin: Secondary | ICD-10-CM | POA: Diagnosis not present

## 2018-11-07 ENCOUNTER — Other Ambulatory Visit: Payer: Self-pay | Admitting: Adult Health

## 2018-11-10 ENCOUNTER — Ambulatory Visit (INDEPENDENT_AMBULATORY_CARE_PROVIDER_SITE_OTHER): Payer: Medicare HMO | Admitting: Adult Health

## 2018-11-10 ENCOUNTER — Encounter: Payer: Self-pay | Admitting: Adult Health

## 2018-11-10 VITALS — BP 138/60 | HR 52 | Temp 97.9°F | Ht 65.75 in | Wt 238.6 lb

## 2018-11-10 DIAGNOSIS — T148XXA Other injury of unspecified body region, initial encounter: Secondary | ICD-10-CM | POA: Diagnosis not present

## 2018-11-10 MED ORDER — METHYLPREDNISOLONE 4 MG PO TBPK: ORAL_TABLET | ORAL | 0 refills | Status: DC

## 2018-11-10 NOTE — Progress Notes (Signed)
Subjective:    Patient ID: Glenn Ortega, male    DOB: 1934/07/13, 85 y.o.   MRN: [id number]  HPI 85 year old male who  has a past medical history of GERD (gastroesophageal reflux disease), Hyperlipidemia, Hypertension, and Spinal stenosis.  He presents to the office today for an acute issue of low back pain that has been becoming progressively worse over the last 4 days. Pain is non radiating. He reports that his wife fell at home and broke her ankle, he has been having to lift her up out of the chair, bed, and in the bathroom. He believes that he hurt his back doing this   Denies any issues with bowel or bladder  Review of Systems See HPI   Past Medical History:  Diagnosis Date  . GERD (gastroesophageal reflux disease)   . Hyperlipidemia   . Hypertension    EKG and chest x ray 8/12 [facility]/states had stress test 2 yrs ago- doesnt remember where- not in [facility]  . Spinal stenosis    lumbar    Social History   Socioeconomic History  . Marital status: Married    Spouse name: [name]  . Number of children: Not on file  . Years of education: Not on file  . Highest education level: Not on file  Occupational History  . Occupation: Estate agent: [employer]  . Financial resource strain: Not on file  . Food insecurity:    Worry: Not on file    Inability: Not on file  . Transportation needs:    Medical: Not on file    Non-medical: Not on file  Tobacco Use  . Smoking status: Never Smoker  . Smokeless tobacco: Never Used  . Tobacco comment: per patient he never smoke. 11/25/2014 /rc  Substance and Sexual Activity  . Alcohol use: Yes    Alcohol/week: 2.0 standard drinks    Types: 2 Standard drinks or equivalent per week    Comment: socially- occ beer  . Drug use: No  . Sexual activity: Not Currently  Lifestyle  . Physical activity:    Days per week: Not on file    Minutes per session: Not on file  . Stress: Not on file  Relationships  . Social  connections:    Talks on phone: Not on file    Gets together: Not on file    Attends religious service: Not on file    Active member of club or organization: Not on file    Attends meetings of clubs or organizations: Not on file    Relationship status: Not on file  . Intimate partner violence:    Fear of current or ex partner: Not on file    Emotionally abused: Not on file    Physically abused: Not on file    Forced sexual activity: Not on file  Other Topics Concern  . Not on file  Social History Narrative   Works 3 nights a week at [employer].    Previously worked as a Freight forwarder for [employer].   Married to [place]. 2 caffeinated drinks per day. Does not exercise he says secondary to back problems.  Lives in a one story home.     Has 2 children.  Education: some college. He is an is in [facility] from [place]    Past Surgical History:  Procedure Laterality Date  . COLONOSCOPY    . CYSTOSCOPY  12/30/2011   Procedure:  CYSTOSCOPY;  Surgeon: Reece Packer, MD;  [facility]: [facility];  Service: Urology;  Laterality: N/A;  . EYE SURGERY Bilateral 06/18/2017   cataract sx with lens implant  . HERNIA REPAIR  2013  . HERNIA REPAIR Right    age  50  . LUMBAR LAMINECTOMY/DECOMPRESSION MICRODISCECTOMY N/A 11/09/2013   Procedure: Lumbar Laminectomy/Decompression, Microdiscectomy Lumbar Three-Four, Four-Five, with Coflex;  Surgeon: Faythe Ghee, MD;  Location: [facility];  Service: Neurosurgery;  Laterality: N/A;  Lumbar Laminectomy/Decompression, Microdiscectomy Lumbar Three-Four, Four-Five, with Coflex  . PAROTIDECTOMY Left 11/17/2015   Procedure: LEFT PAROTIDECTOMY;  Surgeon: Izora Gala, MD;  Location: [facility];  Service: ENT;  Laterality: Left;  . PROSTATE SURGERY  2013  . TONSILLECTOMY    . trigger thumb  1980   right    Family History  Problem Relation Age of Onset  . Hypertension Unknown   . Cancer Mother        bone?  . Myasthenia gravis Father    . Heart disease Brother     Allergies  Allergen Reactions  . Penicillins Swelling    CHILDHOOD ALLERGY Has patient had a PCN reaction causing immediate rash, facial/tongue/throat swelling, SOB or lightheadedness with hypotension: Yes Has patient had a PCN reaction causing severe rash involving mucus membranes or skin necrosis: No Has patient had a PCN reaction that required hospitalization: No Has patient had a PCN reaction occurring within the last 10 years: No If all of the above answers are "NO", then may proceed with Cephalosporin use.   . Gabapentin     "MADE ME FEEL CRAZY"    Current Outpatient Medications on File Prior to Visit  Medication Sig Dispense Refill  . atorvastatin (LIPITOR) 10 MG tablet TAKE ONE TABLET BY MOUTH DAILY 90 tablet 3  . fluticasone (FLONASE) 50 MCG/ACT nasal spray Place 2 sprays into both nostrils daily. 16 g 6  . levothyroxine (SYNTHROID, LEVOTHROID) 50 MCG tablet TAKE ONE TABLET BY MOUTH DAILY 30 tablet 0  . losartan (COZAAR) 25 MG tablet TAKE ONE TABLET BY MOUTH DAILY 90 tablet 3  . Multiple Vitamins-Minerals (PRESERVISION AREDS 2 PO) Take by mouth.    . [DISCONTINUED] terazosin (HYTRIN) 1 MG capsule take 1 capsule by mouth once daily 30 capsule 0   No current facility-administered medications on file prior to visit.     BP 138/60 (BP Location: Left Arm, Patient Position: Sitting, Cuff Size: Large)   Pulse (!) 52   Temp 97.9 F (36.6 C) (Oral)   Ht 5' 5.75" (1.67 m)   Wt 238 lb 9.6 oz (108.2 kg)   BMI 38.80 kg/m       Objective:   Physical Exam Vitals signs and nursing note reviewed.  Constitutional:      Appearance: Normal appearance.  Cardiovascular:     Rate and Rhythm: Normal rate and regular rhythm.     Pulses: Normal pulses.     Heart sounds: Normal heart sounds.  Pulmonary:     Effort: Pulmonary effort is normal.     Breath sounds: Normal breath sounds.  Musculoskeletal:        General: Tenderness (with palpation to right  lower back. No spinal tenderness) present. No swelling, deformity or signs of injury.     Right lower leg: No edema.     Left lower leg: No edema.     Comments: Has steady gait    Skin:    Capillary Refill: Capillary refill takes less than 2 seconds.  Neurological:     General: No focal deficit present.     Mental Status: He is alert and oriented to person, place, and time.        Assessment & Plan:  1. Muscle strain - Ok to take Nsaids for the next three days  - Use heating pad  - Follow up in 2-3 days if no improvement  - methylPREDNISolone (MEDROL DOSEPAK) 4 MG TBPK tablet; Take as directed  Dispense: 21 tablet; Refill: 0  [name], NP

## 2018-11-14 ENCOUNTER — Telehealth: Payer: Self-pay | Admitting: Family Medicine

## 2018-11-14 NOTE — Telephone Encounter (Signed)
Have him finish the medrol dose pac. He can use Naprosyn as directed on prescription   We will see how he is feeling on Thursday

## 2018-11-14 NOTE — Telephone Encounter (Signed)
Spoke to the pt and informed him of the below message. He will continue to take the medrol dose pak and the Naprosyn as prescribed.  Nothing further needed.

## 2018-11-14 NOTE — Telephone Encounter (Signed)
Spoke to the pt.  He continues to have back pain that is worsening while taking the methylPREDNISolone (MEDROL DOSEPAK) 4 MG TBPK tablet.  He states that he has an old prescription of Naprosyn but he did not want to start taking it without [place] knowing.  Please advise.

## 2018-11-16 ENCOUNTER — Telehealth: Payer: Self-pay | Admitting: Adult Health

## 2018-11-16 ENCOUNTER — Ambulatory Visit (INDEPENDENT_AMBULATORY_CARE_PROVIDER_SITE_OTHER): Payer: Medicare HMO

## 2018-11-16 DIAGNOSIS — M545 Low back pain, unspecified: Secondary | ICD-10-CM

## 2018-11-16 NOTE — Telephone Encounter (Signed)
Spoke to patient and he informed that he continues to have low back pain. He was started on a prednisone therapy last week for suspected muscle strain in his low back. He reports that the prednisone did nothing to help with his low back pain. He feels as though the pain is getting worse in his lower back and has now spread across his entire lower back. He also reports a " twinge at times when he is picking up heavy objects or helping his wife get out of bed or off the toilet.   Will order x ray

## 2018-11-22 ENCOUNTER — Telehealth: Payer: Self-pay | Admitting: Adult Health

## 2018-11-22 NOTE — Telephone Encounter (Signed)
Thank you :)

## 2018-11-22 NOTE — Telephone Encounter (Signed)
Back x-ray shows evidence of previous surgery that looks stable.  Additionally back x-ray shows arthritis.  No fractures.  We will look at these results and show pictures with more detail at the next visit on February 6.

## 2018-11-22 NOTE — Telephone Encounter (Signed)
I called patient at least 3 times and I left a message for patient to call office back and push the option for Triage to speak to someone. I called the patient back again this afternoon after 12:00 pm and I finally spoke to him and he has scheduled an appointment for Thursday to be seen for his back. Rhonda Cunningham,CMA

## 2018-11-22 NOTE — Telephone Encounter (Addendum)
Pt called. He states he has called twice and has not heard from you or your nurse. He has low back pain and has  scheduled an appointment with you tomorrow. Velora Heckler has taken an xray of his back and he wants to know if you can give him  the results before his appointment? Thanks

## 2018-11-22 NOTE — Telephone Encounter (Signed)
Patient advised of results.

## 2018-11-23 ENCOUNTER — Encounter: Payer: Self-pay | Admitting: Family Medicine

## 2018-11-23 ENCOUNTER — Ambulatory Visit (INDEPENDENT_AMBULATORY_CARE_PROVIDER_SITE_OTHER): Payer: Medicare HMO | Admitting: Family Medicine

## 2018-11-23 VITALS — BP 140/49 | HR 45 | Wt 238.0 lb

## 2018-11-23 DIAGNOSIS — S39012A Strain of muscle, fascia and tendon of lower back, initial encounter: Secondary | ICD-10-CM

## 2018-11-23 MED ORDER — BACLOFEN 10 MG PO TABS: 10.0000 mg | ORAL_TABLET | Freq: Three times a day (TID) | ORAL | 0 refills | Status: DC | PRN

## 2018-11-23 NOTE — Progress Notes (Signed)
Glenn Ortega is a 85 y.o. male who presents to [facility] today for low back pain.  Glenn Ortega has a 2-week history of low back pain.  His wife broke her ankle about 2 weeks ago and has been very limited mobility.  She requires assistance to stand and he has been helping pull her up.  He thinks that is when his pain started.  He does not recall any specific injury.  He denies any radiating pain weakness or numbness distally.  He was seen by his primary care provider on January 30 who obtained x-rays and prescribed a short course of prednisone.  This has not helped.  Glenn Ortega has tried taking ibuprofen which is helped a little.  Additionally he found an old leftover muscle relaxer which he took which did not help.  He cannot remember what it was.    ROS:  As above  Exam:  BP (!) 140/49   Pulse (!) 45   Wt 238 lb (108 kg)   BMI 38.71 kg/m  Wt Readings from Last 5 Encounters:  11/23/18 238 lb (108 kg)  11/10/18 238 lb 9.6 oz (108.2 kg)  09/27/18 237 lb (107.5 kg)  08/23/18 236 lb (107 kg)  08/14/18 235 lb 8 oz (106.8 kg)   General: Well Developed, well nourished, and in no acute distress.  Neuro/Psych: Alert and oriented x3, extra-ocular muscles intact, able to move all 4 extremities, sensation grossly intact. Skin: Warm and dry, no rashes noted.  Respiratory: Not using accessory muscles, speaking in full sentences, trachea midline.  Cardiovascular: Pulses palpable, no extremity edema. Abdomen: Does not appear distended. MSK:  L-spine: Nontender to spinal midline.  Tender palpation bilateral lumbar paraspinal musculature. Lumbar motion significantly limited flexion extension and rotation bilaterally.  Limited lateral flexion bilaterally as well. Lower extremity strength reflexes and sensation are equal and normal throughout. Antalgic gait present.    Lab and Radiology Results Dg Lumbar Spine Complete  Result Date: 11/16/2018 CLINICAL  DATA:  Acute bilateral low back pain without sciatica. EXAM: LUMBAR SPINE - COMPLETE 4+ VIEW COMPARISON:  Radiographs of November 26, 2013. FINDINGS: No fracture or spondylolisthesis is noted. Status post surgical fusion of L3-4 and L4-5 posterior interspinous spaces. Anterior osteophyte formation is noted at multiple levels of the lumbar spine. Moderate degenerative disc disease is noted at L4-5. IMPRESSION: Postsurgical and degenerative changes as described above. No acute abnormality seen in the lumbar spine. Electronically Signed   By: Marijo Conception, M.D.   On: 11/16/2018 16:54  I personally (independently) visualized and performed the interpretation of the images attached in this note.       Assessment and Plan: 85 y.o. male with  Lumbosacral strain.  Patient with new onset low back pain for the last 2 weeks.  Symptoms occurred after his wife required lots of assistance to stand.  X-ray images reviewed and fortunately did not show acute fractures.  Patient does have postsurgical and degenerative changes.  I believe the pain is more likely myofascial in nature.  Plan for physical therapy home exercise program heating pad and TENS unit.  Continue medications for pain management.  Additionally trial of baclofen which may help a bit.  Discussed narcotic-based pain medications.  Patient will think about it and would like to hold off at this time.  Recheck in a few weeks if not improving.   CC:  [name], NP  Orders Placed This Encounter  Procedures  . Ambulatory referral to  Physical Therapy    Referral Priority:   Routine    Referral Type:   Physical Medicine    Referral Reason:   Specialty Services Required    Requested Specialty:   Physical Therapy   Meds ordered this encounter  Medications  . baclofen (LIORESAL) 10 MG tablet    Sig: Take 1 tablet (10 mg total) by mouth 3 (three) times daily as needed for muscle spasms.    Dispense:  30 each    Refill:  0    Historical  information moved to improve visibility of documentation.  Past Medical History:  Diagnosis Date  . GERD (gastroesophageal reflux disease)   . Hyperlipidemia   . Hypertension    EKG and chest x ray 8/12 [facility]/states had stress test 2 yrs ago- doesnt remember where- not in [facility]  . Spinal stenosis    lumbar   Past Surgical History:  Procedure Laterality Date  . COLONOSCOPY    . CYSTOSCOPY  12/30/2011   Procedure: CYSTOSCOPY;  Surgeon: Reece Packer, MD;  Location: [facility];  Service: Urology;  Laterality: N/A;  . EYE SURGERY Bilateral 06/18/2017   cataract sx with lens implant  . HERNIA REPAIR  2013  . HERNIA REPAIR Right    age  75  . LUMBAR LAMINECTOMY/DECOMPRESSION MICRODISCECTOMY N/A 11/09/2013   Procedure: Lumbar Laminectomy/Decompression, Microdiscectomy Lumbar Three-Four, Four-Five, with Coflex;  Surgeon: Faythe Ghee, MD;  Location: [facility];  Service: Neurosurgery;  Laterality: N/A;  Lumbar Laminectomy/Decompression, Microdiscectomy Lumbar Three-Four, Four-Five, with Coflex  . PAROTIDECTOMY Left 11/17/2015   Procedure: LEFT PAROTIDECTOMY;  Surgeon: Izora Gala, MD;  Location: [facility];  Service: ENT;  Laterality: Left;  . PROSTATE SURGERY  2013  . TONSILLECTOMY    . trigger thumb  1980   right   Social History   Tobacco Use  . Smoking status: Never Smoker  . Smokeless tobacco: Never Used  . Tobacco comment: per patient he never smoke. 11/25/2014 /rc  Substance Use Topics  . Alcohol use: Yes    Alcohol/week: 2.0 standard drinks    Types: 2 Standard drinks or equivalent per week    Comment: socially- occ beer   family history includes Cancer in his mother; Heart disease in his brother; Hypertension in his unknown relative; Myasthenia gravis in his father.  Medications: Current Outpatient Medications  Medication Sig Dispense Refill  . atorvastatin (LIPITOR) 10 MG tablet TAKE ONE TABLET BY MOUTH DAILY 90 tablet 3  . fluticasone (FLONASE) 50  MCG/ACT nasal spray Place 2 sprays into both nostrils daily. 16 g 6  . levothyroxine (SYNTHROID, LEVOTHROID) 50 MCG tablet TAKE ONE TABLET BY MOUTH DAILY 30 tablet 0  . losartan (COZAAR) 25 MG tablet TAKE ONE TABLET BY MOUTH DAILY 90 tablet 3  . Multiple Vitamins-Minerals (PRESERVISION AREDS 2 PO) Take by mouth.    . baclofen (LIORESAL) 10 MG tablet Take 1 tablet (10 mg total) by mouth 3 (three) times daily as needed for muscle spasms. 30 each 0   No current facility-administered medications for this visit.    Allergies  Allergen Reactions  . Penicillins Swelling    CHILDHOOD ALLERGY Has patient had a PCN reaction causing immediate rash, facial/tongue/throat swelling, SOB or lightheadedness with hypotension: Yes Has patient had a PCN reaction causing severe rash involving mucus membranes or skin necrosis: No Has patient had a PCN reaction that required hospitalization: No Has patient had a PCN reaction occurring within the last 10 years: No If all  of the above answers are "NO", then may proceed with Cephalosporin use.   . Gabapentin     "MADE ME FEEL CRAZY"      Discussed warning signs or symptoms. Please see discharge instructions. Patient expresses understanding.

## 2018-11-23 NOTE — Patient Instructions (Addendum)
Thank you for coming in today. Attend PT.  Use heating pad and TENS unit.  Continue ibuprofen as needed.   Use baclofen as needed 3x daily for muscle spasms.   Recheck in about 4 weeks as needed if not better.  Return sooner if needed.   TENS UNIT: This is helpful for muscle pain and spasm.   Search and Purchase a TENS 7000 2nd edition at  www.tenspros.com or www.[employer].com It should be less than $30.     TENS unit instructions: Do not shower or bathe with the unit on Turn the unit off before removing electrodes or batteries If the electrodes lose stickiness add a drop of water to the electrodes after they are disconnected from the unit and place on plastic sheet. If you continued to have difficulty, call the TENS unit company to purchase more electrodes. Do not apply lotion on the skin area prior to use. Make sure the skin is clean and dry as this will help prolong the life of the electrodes. After use, always check skin for unusual red areas, rash or other skin difficulties. If there are any skin problems, does not apply electrodes to the same area. Never remove the electrodes from the unit by pulling the wires. Do not use the TENS unit or electrodes other than as directed. Do not change electrode placement without consultating your therapist or physician. Keep 2 fingers with between each electrode. Wear time ratio is 2:1, on to off times.    For example on for 30 minutes off for 15 minutes and then on for 30 minutes off for 15 minutes     Lumbosacral Strain Lumbosacral strain is an injury that causes pain in the lower back (lumbosacral spine). This injury usually occurs from overstretching the muscles or ligaments along your spine. A strain can affect one or more muscles or cord-like tissues that connect bones to other bones (ligaments). What are the causes? This condition may be caused by:  A hard, direct hit (blow) to the back.  Excessive stretching of the lower back  muscles. This may result from: ? A fall. ? Lifting something heavy. ? Repetitive movements such as bending or crouching. What increases the risk? The following factors may increase your risk of getting this condition:  Participating in sports or activities that involve: ? A sudden twist of the back. ? Pushing or pulling motions.  Being overweight or obese.  Having poor strength and flexibility, especially tight hamstrings or weak muscles in the back or abdomen.  Having too much of a curve in the lower back.  Having a pelvis that is tilted forward. What are the signs or symptoms? The main symptom of this condition is pain in the lower back, at the site of the strain. Pain may extend (radiate) down one or both legs. How is this diagnosed? This condition is diagnosed based on:  Your symptoms.  Your medical history.  A physical exam. ? Your health care provider may push on certain areas of your back to determine the source of your pain. ? You may be asked to bend forward, backward, and side to side to assess the severity of your pain and your range of motion.  Imaging tests, such as: ? X-rays. ? MRI.  How is this treated? Treatment for this condition may include:  Putting heat and cold on the affected area.  Medicines to help relieve pain and relax your muscles (muscle relaxants).  NSAIDs to help reduce swelling and discomfort. When your  symptoms improve, it is important to gradually return to your normal routine as soon as possible to reduce pain, avoid stiffness, and avoid loss of muscle strength. Generally, symptoms should improve within 6 weeks of treatment. However, recovery time varies. Follow these instructions at home: Managing pain, stiffness, and swelling   If directed, put ice on the injured area during the first 24 hours after your strain. ? Put ice in a plastic bag. ? Place a towel between your skin and the bag. ? Leave the ice on for 20 minutes, 2-3 times  a day.  If directed, put heat on the affected area as often as told by your health care provider. Use the heat source that your health care provider recommends, such as a moist heat pack or a heating pad. ? Place a towel between your skin and the heat source. ? Leave the heat on for 20-30 minutes. ? Remove the heat if your skin turns bright red. This is especially important if you are unable to feel pain, heat, or cold. You may have a greater risk of getting burned. Activity  Rest and return to your normal activities as told by your health care provider. Ask your health care provider what activities are safe for you.  Avoid activities that take a lot of energy for as long as told by your health care provider. General instructions  Take over-the-counter and prescription medicines only as told by your health care provider.  Donot drive or use heavy machinery while taking prescription pain medicine.  Do not use any products that contain nicotine or tobacco, such as cigarettes and e-cigarettes. If you need help quitting, ask your health care provider.  Keep all follow-up visits as told by your health care provider. This is important. How is this prevented?  Use correct form when playing sports and lifting heavy objects.  Use good posture when sitting and standing.  Maintain a healthy weight.  Sleep on a mattress with medium firmness to support your back.  Be safe and responsible while being active to avoid falls.  Do at least 150 minutes of moderate-intensity exercise each week, such as brisk walking or water aerobics. Try a form of exercise that takes stress off your back, such as swimming or stationary cycling.  Maintain physical fitness, including: ? Strength. ? Flexibility. ? Cardiovascular fitness. ? Endurance. Contact a health care provider if:  Your back pain does not improve after 6 weeks of treatment.  Your symptoms get worse. Get help right away if:  Your back  pain is severe.  You cannot stand or walk.  You have difficulty controlling when you urinate or when you have a bowel movement.  You feel nauseous or you vomit.  Your feet get very cold.  You have numbness, tingling, weakness, or problems using your arms or legs.  You develop any of the following: ? Shortness of breath. ? Dizziness. ? Pain in your legs. ? Weakness in your buttocks or legs. ? Discoloration of the skin on your toes or legs. This information is not intended to replace advice given to you by your health care provider. Make sure you discuss any questions you have with your health care provider. Document Released: 07/14/2005 Document Revised: 04/23/2016 Document Reviewed: 03/07/2016 Elsevier Interactive Patient Education  2019 [name].

## 2018-11-29 ENCOUNTER — Ambulatory Visit: Payer: Medicare HMO | Admitting: Physical Therapy

## 2018-11-30 ENCOUNTER — Other Ambulatory Visit: Payer: Self-pay | Admitting: Family Medicine

## 2018-12-07 ENCOUNTER — Other Ambulatory Visit: Payer: Self-pay | Admitting: Adult Health

## 2018-12-08 ENCOUNTER — Other Ambulatory Visit: Payer: Self-pay | Admitting: Adult Health

## 2018-12-08 ENCOUNTER — Other Ambulatory Visit (INDEPENDENT_AMBULATORY_CARE_PROVIDER_SITE_OTHER): Payer: Medicare HMO

## 2018-12-08 DIAGNOSIS — R946 Abnormal results of thyroid function studies: Secondary | ICD-10-CM | POA: Diagnosis not present

## 2018-12-08 LAB — TSH: TSH: 2.21 u[IU]/mL (ref 0.35–4.50)

## 2018-12-08 NOTE — Telephone Encounter (Signed)
Sent to the pharmacy by e-scribe. 

## 2018-12-25 ENCOUNTER — Ambulatory Visit (INDEPENDENT_AMBULATORY_CARE_PROVIDER_SITE_OTHER): Payer: Medicare HMO | Admitting: Family Medicine

## 2018-12-25 VITALS — BP 124/69 | HR 61 | Wt 235.0 lb

## 2018-12-25 DIAGNOSIS — S39012A Strain of muscle, fascia and tendon of lower back, initial encounter: Secondary | ICD-10-CM | POA: Diagnosis not present

## 2018-12-25 NOTE — Progress Notes (Signed)
Glenn Ortega is a 85 y.o. male who presents to [facility] today for follow up of lumbarback pain after overexerting himself. Reports pain and stiffness, especially when lying in bed. Patient did not pursue PT due to high co-pay but reports pain is 95% better since Feb. he denies any radiating pain weakness or numbness or bowel bladder dysfunction.  He notes his wife broke her ankle and still is having trouble with mobility requiring him to do more lifting.  Patient has been using TENS unit which he does find helpful.    ROS:  As above  Exam:  BP 124/69   Pulse 61   Wt 235 lb (106.6 kg)   BMI 38.22 kg/m  Wt Readings from Last 5 Encounters:  12/25/18 235 lb (106.6 kg)  11/23/18 238 lb (108 kg)  11/10/18 238 lb 9.6 oz (108.2 kg)  09/27/18 237 lb (107.5 kg)  08/23/18 236 lb (107 kg)   General: Well Developed, well nourished, and in no acute distress.  Neuro/Psych: Alert and oriented x3, extra-ocular muscles intact, able to move all 4 extremities, sensation grossly intact. Skin: Warm and dry, no rashes noted.  Respiratory: Not using accessory muscles, speaking in full sentences, trachea midline.  Cardiovascular: Pulses palpable, no extremity edema. Abdomen: Does not appear distended. MSK:   Lspine:  Normal appearing No pain to palpation spinal midline. Mild TTP perispinal muscles. Range of motion decreased flexion rotation and lateral flexion. Lower extremity strength is intact throughout.  Reflexes are equal normal throughout. Intact sensation distally.    Lab and Radiology Results Dg Lumbar Spine Complete  Result Date: 11/16/2018 CLINICAL DATA:  Acute bilateral low back pain without sciatica. EXAM: LUMBAR SPINE - COMPLETE 4+ VIEW COMPARISON:  Radiographs of November 26, 2013. FINDINGS: No fracture or spondylolisthesis is noted. Status post surgical fusion of L3-4 and L4-5 posterior interspinous spaces. Anterior osteophyte  formation is noted at multiple levels of the lumbar spine. Moderate degenerative disc disease is noted at L4-5. IMPRESSION: Postsurgical and degenerative changes as described above. No acute abnormality seen in the lumbar spine. Electronically Signed   By: Marijo Conception, M.D.   On: 11/16/2018 16:54   I personally (independently) visualized and performed the interpretation of the images attached in this note.     Assessment and Plan: 85 y.o. male with lumbosacral perispinal back pain and stiffness resulting from overexertion 1.5 months ago. Patient reports 95% improvement in pain but did not pursue physical therapy due to high co-pay. Recommend continued rest, combined with physical therapy every other week if patient is willing.  Continue TENS unit and heating pad.   I spent 15 minutes with this patient, greater than 50% was face-to-face time counseling regarding differential diagnosis treatment plan and options.. Additionally discussed ways to reduce exposure risk for novel coronavirus.   PDMP not reviewed this encounter. No orders of the defined types were placed in this encounter.  No orders of the defined types were placed in this encounter.   Historical information moved to improve visibility of documentation.  Past Medical History:  Diagnosis Date  . GERD (gastroesophageal reflux disease)   . Hyperlipidemia   . Hypertension    EKG and chest x ray 8/12 [facility]/states had stress test 2 yrs ago- doesnt remember where- not in [facility]  . Spinal stenosis    lumbar   Past Surgical History:  Procedure Laterality Date  . COLONOSCOPY    . CYSTOSCOPY  12/30/2011   Procedure:  CYSTOSCOPY;  Surgeon: Reece Packer, MD;  Location: [facility];  Service: Urology;  Laterality: N/A;  . EYE SURGERY Bilateral 06/18/2017   cataract sx with lens implant  . HERNIA REPAIR  2013  . HERNIA REPAIR Right    age  39  . LUMBAR LAMINECTOMY/DECOMPRESSION MICRODISCECTOMY N/A 11/09/2013   Procedure: Lumbar  Laminectomy/Decompression, Microdiscectomy Lumbar Three-Four, Four-Five, with Coflex;  Surgeon: Faythe Ghee, MD;  Location: [facility];  Service: Neurosurgery;  Laterality: N/A;  Lumbar Laminectomy/Decompression, Microdiscectomy Lumbar Three-Four, Four-Five, with Coflex  . PAROTIDECTOMY Left 11/17/2015   Procedure: LEFT PAROTIDECTOMY;  Surgeon: Izora Gala, MD;  Location: [facility];  Service: ENT;  Laterality: Left;  . PROSTATE SURGERY  2013  . TONSILLECTOMY    . trigger thumb  1980   right   Social History   Tobacco Use  . Smoking status: Never Smoker  . Smokeless tobacco: Never Used  . Tobacco comment: per patient he never smoke. 11/25/2014 /rc  Substance Use Topics  . Alcohol use: Yes    Alcohol/week: 2.0 standard drinks    Types: 2 Standard drinks or equivalent per week    Comment: socially- occ beer   family history includes Cancer in his mother; Heart disease in his brother; Hypertension in his unknown relative; Myasthenia gravis in his father.  Medications: Current Outpatient Medications  Medication Sig Dispense Refill  . atorvastatin (LIPITOR) 10 MG tablet TAKE ONE TABLET BY MOUTH DAILY 90 tablet 3  . baclofen (LIORESAL) 10 MG tablet TAKE ONE TABLET BY MOUTH THREE TIMES A DAY AS NEEDED FOR MUSCLE SPASMS 30 tablet 0  . fluticasone (FLONASE) 50 MCG/ACT nasal spray Place 2 sprays into both nostrils daily. 16 g 6  . levothyroxine (SYNTHROID, LEVOTHROID) 50 MCG tablet TAKE ONE TABLET BY MOUTH DAILY 90 tablet 3  . losartan (COZAAR) 25 MG tablet TAKE ONE TABLET BY MOUTH DAILY 90 tablet 3  . Multiple Vitamins-Minerals (PRESERVISION AREDS 2 PO) Take by mouth.     No current facility-administered medications for this visit.    Allergies  Allergen Reactions  . Penicillins Swelling    CHILDHOOD ALLERGY Has patient had a PCN reaction causing immediate rash, facial/tongue/throat swelling, SOB or lightheadedness with hypotension: Yes Has patient had a PCN reaction  causing severe rash involving mucus membranes or skin necrosis: No Has patient had a PCN reaction that required hospitalization: No Has patient had a PCN reaction occurring within the last 10 years: No If all of the above answers are "NO", then may proceed with Cephalosporin use.   . Gabapentin     "MADE ME FEEL CRAZY"      Discussed warning signs or symptoms. Please see discharge instructions. Patient expresses understanding.  I personally was present and performed or re-performed the history, physical exam and medical decision-making activities of this service and have verified that the service and findings are accurately documented in the student's note. ___________________________________________ Lynne Leader M.D., ABFM., CAQSM. Primary Care and Sports Medicine Adjunct Instructor of [employer]

## 2018-12-25 NOTE — Patient Instructions (Signed)
Thank you for coming in today. Consider PT.  Keep working on home exercises and TENS unit and heating pad.  Recheck with me as needed.      Lumbosacral Strain Lumbosacral strain is an injury that causes pain in the lower back (lumbosacral spine). This injury usually occurs from overstretching the muscles or ligaments along your spine. A strain can affect one or more muscles or cord-like tissues that connect bones to other bones (ligaments). What are the causes? This condition may be caused by:  A hard, direct hit (blow) to the back.  Excessive stretching of the lower back muscles. This may result from: ? A fall. ? Lifting something heavy. ? Repetitive movements such as bending or crouching. What increases the risk? The following factors may increase your risk of getting this condition:  Participating in sports or activities that involve: ? A sudden twist of the back. ? Pushing or pulling motions.  Being overweight or obese.  Having poor strength and flexibility, especially tight hamstrings or weak muscles in the back or abdomen.  Having too much of a curve in the lower back.  Having a pelvis that is tilted forward. What are the signs or symptoms? The main symptom of this condition is pain in the lower back, at the site of the strain. Pain may extend (radiate) down one or both legs. How is this diagnosed? This condition is diagnosed based on:  Your symptoms.  Your medical history.  A physical exam. ? Your health care provider may push on certain areas of your back to determine the source of your pain. ? You may be asked to bend forward, backward, and side to side to assess the severity of your pain and your range of motion.  Imaging tests, such as: ? X-rays. ? MRI.  How is this treated? Treatment for this condition may include:  Putting heat and cold on the affected area.  Medicines to help relieve pain and relax your muscles (muscle relaxants).  NSAIDs to help  reduce swelling and discomfort. When your symptoms improve, it is important to gradually return to your normal routine as soon as possible to reduce pain, avoid stiffness, and avoid loss of muscle strength. Generally, symptoms should improve within 6 weeks of treatment. However, recovery time varies. Follow these instructions at home: Managing pain, stiffness, and swelling   If directed, put ice on the injured area during the first 24 hours after your strain. ? Put ice in a plastic bag. ? Place a towel between your skin and the bag. ? Leave the ice on for 20 minutes, 2-3 times a day.  If directed, put heat on the affected area as often as told by your health care provider. Use the heat source that your health care provider recommends, such as a moist heat pack or a heating pad. ? Place a towel between your skin and the heat source. ? Leave the heat on for 20-30 minutes. ? Remove the heat if your skin turns bright red. This is especially important if you are unable to feel pain, heat, or cold. You may have a greater risk of getting burned. Activity  Rest and return to your normal activities as told by your health care provider. Ask your health care provider what activities are safe for you.  Avoid activities that take a lot of energy for as long as told by your health care provider. General instructions  Take over-the-counter and prescription medicines only as told by your health care provider.  Donot drive or use heavy machinery while taking prescription pain medicine.  Do not use any products that contain nicotine or tobacco, such as cigarettes and e-cigarettes. If you need help quitting, ask your health care provider.  Keep all follow-up visits as told by your health care provider. This is important. How is this prevented?  Use correct form when playing sports and lifting heavy objects.  Use good posture when sitting and standing.  Maintain a healthy weight.  Sleep on a  mattress with medium firmness to support your back.  Be safe and responsible while being active to avoid falls.  Do at least 150 minutes of moderate-intensity exercise each week, such as brisk walking or water aerobics. Try a form of exercise that takes stress off your back, such as swimming or stationary cycling.  Maintain physical fitness, including: ? Strength. ? Flexibility. ? Cardiovascular fitness. ? Endurance. Contact a health care provider if:  Your back pain does not improve after 6 weeks of treatment.  Your symptoms get worse. Get help right away if:  Your back pain is severe.  You cannot stand or walk.  You have difficulty controlling when you urinate or when you have a bowel movement.  You feel nauseous or you vomit.  Your feet get very cold.  You have numbness, tingling, weakness, or problems using your arms or legs.  You develop any of the following: ? Shortness of breath. ? Dizziness. ? Pain in your legs. ? Weakness in your buttocks or legs. ? Discoloration of the skin on your toes or legs. This information is not intended to replace advice given to you by your health care provider. Make sure you discuss any questions you have with your health care provider. Document Released: 07/14/2005 Document Revised: 04/23/2016 Document Reviewed: 03/07/2016 Elsevier Interactive Patient Education  2019 [name].

## 2019-01-05 ENCOUNTER — Ambulatory Visit: Payer: Medicare HMO | Admitting: Physical Therapy

## 2019-01-12 ENCOUNTER — Other Ambulatory Visit: Payer: Self-pay

## 2019-01-12 ENCOUNTER — Ambulatory Visit (INDEPENDENT_AMBULATORY_CARE_PROVIDER_SITE_OTHER): Payer: Medicare HMO | Admitting: Family Medicine

## 2019-01-12 ENCOUNTER — Encounter: Payer: Self-pay | Admitting: Family Medicine

## 2019-01-12 VITALS — BP 141/49 | HR 61 | Temp 97.7°F | Wt 233.0 lb

## 2019-01-12 DIAGNOSIS — G5602 Carpal tunnel syndrome, left upper limb: Secondary | ICD-10-CM | POA: Diagnosis not present

## 2019-01-12 DIAGNOSIS — G5601 Carpal tunnel syndrome, right upper limb: Secondary | ICD-10-CM

## 2019-01-12 NOTE — Patient Instructions (Signed)
Thank you for coming in today. Recheck as needed.  Call or go to the ER if you develop a large red swollen joint with extreme pain or oozing puss.  Stay safe.

## 2019-01-12 NOTE — Progress Notes (Signed)
Glenn Ortega is a 85 y.o. male who presents to [facility] today for bilateral carpal tunnel syndrome.  Glenn Ortega has a history of bilateral carpal tunnel syndrome.  He has had treatment with conservative measures including bracing and splinting at night.  Additionally he has had injections.  His last steroid injection was May 2019.  He notes recently his symptoms have been worsening despite night splinting.  He thinks has been a little more active with his hands recently.  He denies any injury.  He notes pain tingling and some numbness to his palms bilaterally and to the first 3 digits of bilateral hands.    ROS:  As above  Exam:  BP (!) 141/49    Pulse 61    Temp 97.7 F (36.5 C) (Oral)    Wt 233 lb (105.7 kg)    BMI 37.89 kg/m  Wt Readings from Last 5 Encounters:  01/12/19 233 lb (105.7 kg)  12/25/18 235 lb (106.6 kg)  11/23/18 238 lb (108 kg)  11/10/18 238 lb 9.6 oz (108.2 kg)  09/27/18 237 lb (107.5 kg)   General: Well Developed, well nourished, and in no acute distress.  Neuro/Psych: Alert and oriented x3, extra-ocular muscles intact, able to move all 4 extremities, sensation grossly intact. Skin: Warm and dry, no rashes noted.  Respiratory: Not using accessory muscles, speaking in full sentences, trachea midline.  Cardiovascular: Pulses palpable, no extremity edema. Abdomen: Does not appear distended. MSK:  Right hand and wrist relatively normal-appearing no significant thenar atrophy. Normal motion.  Normal strength.  Positive Tinel's at carpal tunnel.  Left hand and wrist normal-appearing with no thenar atrophy. Normal motion and normal strength.  Positive Tinel's at carpal tunnel.  Positive Phalen's test bilaterally.  Pulses and capillary refill are intact distally.  Sensation is intact light touch.    Lab and Radiology Results Procedure: Real-time Ultrasound Guided right median nerve hydrodissection carpal  tunnel Device: GE Logiq E   Images permanently stored and available for review in the ultrasound unit. Verbal informed consent obtained.  Discussed risks and benefits of procedure. Warned about infection bleeding damage to structures skin hypopigmentation and fat atrophy among others. Patient expresses understanding and agreement Time-out conducted.   Noted no overlying erythema, induration, or other signs of local infection.   Skin prepped in a sterile fashion.   Local anesthesia: Topical Ethyl chloride.   With sterile technique and under real time ultrasound guidance:  40 mg of Kenalog and 1 mL of lidocaine injected easily.   Completed without difficulty   Pain immediately resolved suggesting accurate placement of the medication.   Advised to call if fevers/chills, erythema, induration, drainage, or persistent bleeding.   Images permanently stored and available for review in the ultrasound unit.  Impression: Technically successful ultrasound guided injection.   Procedure: Real-time Ultrasound Guided left median nerve hydrodissection carpal tunnel Device: GE Logiq E   Images permanently stored and available for review in the ultrasound unit. Verbal informed consent obtained.  Discussed risks and benefits of procedure. Warned about infection bleeding damage to structures skin hypopigmentation and fat atrophy among others. Patient expresses understanding and agreement Time-out conducted.   Noted no overlying erythema, induration, or other signs of local infection.   Skin prepped in a sterile fashion.   Local anesthesia: Topical Ethyl chloride.   With sterile technique and under real time ultrasound guidance:  40 mg of Kenalog and 1 mL of lidocaine injected easily.   Completed without  difficulty   Pain immediately resolved suggesting accurate placement of the medication.   Advised to call if fevers/chills, erythema, induration, drainage, or persistent bleeding.   Images permanently stored  and available for review in the ultrasound unit.  Impression: Technically successful ultrasound guided injection.           Assessment and Plan: 85 y.o. male with bilateral carpal tunnel syndrome.  Patient failing typical conservative management.  Plan for ultrasound-guided median nerve hydrodissection as discussed above.  Continue night splinting as needed.  Watchful waiting and relative activity reduction if possible.  Check back as needed.  We will try to conduct visits via virtual web interface if possible to minimize exposure to COVID-19.   PDMP not reviewed this encounter. No orders of the defined types were placed in this encounter.  No orders of the defined types were placed in this encounter.   Historical information moved to improve visibility of documentation.  Past Medical History:  Diagnosis Date   GERD (gastroesophageal reflux disease)    Hyperlipidemia    Hypertension    EKG and chest x ray 8/12 [facility]/states had stress test 2 yrs ago- doesnt remember where- not in EPIC   Spinal stenosis    lumbar   Past Surgical History:  Procedure Laterality Date   COLONOSCOPY     CYSTOSCOPY  12/30/2011   Procedure: CYSTOSCOPY;  Surgeon: Reece Packer, MD;  Location: [facility];  Service: Urology;  Laterality: N/A;   EYE SURGERY Bilateral 06/18/2017   cataract sx with lens implant   HERNIA REPAIR  2013   HERNIA REPAIR Right    age  28   LUMBAR LAMINECTOMY/DECOMPRESSION MICRODISCECTOMY N/A 11/09/2013   Procedure: Lumbar Laminectomy/Decompression, Microdiscectomy Lumbar Three-Four, Four-Five, with Coflex;  Surgeon: Faythe Ghee, MD;  Location: [facility];  Service: Neurosurgery;  Laterality: N/A;  Lumbar Laminectomy/Decompression, Microdiscectomy Lumbar Three-Four, Four-Five, with Coflex   PAROTIDECTOMY Left 11/17/2015   Procedure: LEFT PAROTIDECTOMY;  Surgeon: Izora Gala, MD;  Location: [facility];  Service: ENT;  Laterality: Left;   PROSTATE  SURGERY  2013   TONSILLECTOMY     trigger thumb  1980   right   Social History   Tobacco Use   Smoking status: Never Smoker   Smokeless tobacco: Never Used   Tobacco comment: per patient he never smoke. 11/25/2014 /rc  Substance Use Topics   Alcohol use: Yes    Alcohol/week: 2.0 standard drinks    Types: 2 Standard drinks or equivalent per week    Comment: socially- occ beer   family history includes Cancer in his mother; Heart disease in his brother; Hypertension in his unknown relative; Myasthenia gravis in his father.  Medications: Current Outpatient Medications  Medication Sig Dispense Refill   atorvastatin (LIPITOR) 10 MG tablet TAKE ONE TABLET BY MOUTH DAILY 90 tablet 3   baclofen (LIORESAL) 10 MG tablet TAKE ONE TABLET BY MOUTH THREE TIMES A DAY AS NEEDED FOR MUSCLE SPASMS 30 tablet 0   fluticasone (FLONASE) 50 MCG/ACT nasal spray Place 2 sprays into both nostrils daily. 16 g 6   levothyroxine (SYNTHROID, LEVOTHROID) 50 MCG tablet TAKE ONE TABLET BY MOUTH DAILY 90 tablet 3   losartan (COZAAR) 25 MG tablet TAKE ONE TABLET BY MOUTH DAILY 90 tablet 3   Multiple Vitamins-Minerals (PRESERVISION AREDS 2 PO) Take by mouth.     No current facility-administered medications for this visit.    Allergies  Allergen Reactions   Penicillins Swelling    CHILDHOOD  ALLERGY Has patient had a PCN reaction causing immediate rash, facial/tongue/throat swelling, SOB or lightheadedness with hypotension: Yes Has patient had a PCN reaction causing severe rash involving mucus membranes or skin necrosis: No Has patient had a PCN reaction that required hospitalization: No Has patient had a PCN reaction occurring within the last 10 years: No If all of the above answers are "NO", then may proceed with Cephalosporin use.    Gabapentin     "MADE ME FEEL CRAZY"      Discussed warning signs or symptoms. Please see discharge instructions. Patient expresses understanding.

## 2019-02-08 ENCOUNTER — Other Ambulatory Visit: Payer: Self-pay

## 2019-02-08 ENCOUNTER — Ambulatory Visit: Payer: Medicare HMO | Attending: Adult Health | Admitting: Physical Therapy

## 2019-02-08 ENCOUNTER — Encounter: Payer: Self-pay | Admitting: Physical Therapy

## 2019-02-08 DIAGNOSIS — M545 Low back pain, unspecified: Secondary | ICD-10-CM

## 2019-02-08 DIAGNOSIS — G8929 Other chronic pain: Secondary | ICD-10-CM | POA: Insufficient documentation

## 2019-02-08 DIAGNOSIS — M6281 Muscle weakness (generalized): Secondary | ICD-10-CM | POA: Diagnosis present

## 2019-02-08 NOTE — Therapy (Signed)
[facility] [address], [place], [place], [address] Phone: [phone]   Fax:  [phone]  Physical Therapy Evaluation  Patient Details  Name: Glenn Ortega MRN: [id number] Date of Birth: [birth year] Referring Provider (PT): Gregor Hams, MD,   Encounter Date: 02/08/2019  PT End of Session - 02/08/19 1542    Visit Number  1    Date for PT Re-Evaluation  04/05/19    Authorization Type  A[employer]na Medicare    PT Start Time  1450    PT Stop Time  1540    PT Time Calculation (min)  50 min    Activity Tolerance  Patient tolerated treatment well    Behavior During Therapy  [facility] for tasks assessed/performed       Past Medical History:  Diagnosis Date  . GERD (gastroesophageal reflux disease)   . Hyperlipidemia   . Hypertension    EKG and chest x ray 8/12 EPIC/states had stress test 2 yrs ago- doesnt remember where- not in EPIC  . Spinal stenosis    lumbar    Past Surgical History:  Procedure Laterality Date  . COLONOSCOPY    . CYSTOSCOPY  12/30/2011   Procedure: CYSTOSCOPY;  Surgeon: Reece Packer, MD;  Location: [facility];  Service: Urology;  Laterality: N/A;  . EYE SURGERY Bilateral 06/18/2017   cataract sx with lens implant  . HERNIA REPAIR  2013  . HERNIA REPAIR Right    age  85  . LUMBAR LAMINECTOMY/DECOMPRESSION MICRODISCECTOMY N/A 11/09/2013   Procedure: Lumbar Laminectomy/Decompression, Microdiscectomy Lumbar Three-Four, Four-Five, with Coflex;  Surgeon: Faythe Ghee, MD;  [facility];  Service: Neurosurgery;  Laterality: N/A;  Lumbar Laminectomy/Decompression, Microdiscectomy Lumbar Three-Four, Four-Five, with Coflex  . PAROTIDECTOMY Left 11/17/2015   Procedure: LEFT PAROTIDECTOMY;  Surgeon: Izora Gala, MD;  Location: [place];  Service: ENT;  Laterality: Left;  . PROSTATE SURGERY  2013  . TONSILLECTOMY    . trigger thumb  1980   right    There were no vitals filed for  this visit.   Subjective Assessment - 02/08/19 1458    Subjective  Pt reports low back pain which began acutely several months ago when trying to help pick up his wife who had broken her ankle.  He has a long history of back pain.  Pt has a history of lumbar fusion L3-L5 performed approx 83-6 years ago.      Pertinent History  lumbar fusion L3-L5 5 years ago    Limitations  Sitting;Lifting;Walking;Standing;Other (comment)   reaching   How long can you sit comfortably?  3-4 hours    How long can you stand comfortably?  30 min    How long can you walk comfortably?  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  Yes    Pain Score  3    can get up to a 7/10   Pain Location  Back    Pain Orientation  Right;Left;Lower    Pain Descriptors / Indicators  Aching;Sharp    Pain Type  Chronic pain    Pain Onset  More than a month ago    Pain Frequency  Constant   varies in intensity   Aggravating Factors   prolonged sitting and walking, bed mobility, reaching/lifting/bending to help wife with transfers/dressing    Pain Relieving Factors  TENS unit, maybe ice    Effect of Pain on Daily Activities  pain with caregiving for wife, can't sit or walk very long without pain, turning in bed         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 [place]   Overall Cognitive Status  Within Functional Limits for tasks assessed      Observation/Other Assessments   Observations  mild abdominal hernia observed during bed mobility, central lumbar scar consistent with PSH L3-L5 lumbar fusion, poor scar mobility inferior aspect    Focus on Therapeutic Outcomes (FOTO)   44%   goal 32%     Sensation   Light Touch  Appears Intact      Posture/Postural Control   Posture/Postural Control  Postural limitations    Postural Limitations  Decreased lumbar lordosis    Posture Comments  wide stance, mild out-toeing      ROM / Strength   AROM / PROM / Strength  AROM;Strength      AROM   Overall AROM Comments  bil hips limited all planes by approx 50%    AROM Assessment Site  Lumbar    Lumbar Flexion  fingertips to mid-shin    Lumbar Extension  2    Lumbar - Right Side Bend  5    Lumbar - Left Side Bend  5      Strength   Strength Assessment Site  Hip;Knee    Right/Left Hip  Right;Left    Right Hip Flexion  4-/5    Right Hip Extension  4-/5    Right Hip External Rotation   4-/5    Right Hip Internal Rotation  4/5    Right Hip ABduction  3+/5    Left Hip Flexion  3+/5    Left Hip Extension  4-/5    Left Hip External Rotation  4-/5    Left Hip Internal Rotation  4/5    Left Hip ABduction  3+/5    Right/Left Knee  Right;Left    Right Knee Flexion  3+/5    Right Knee Extension  4-/5    Left Knee Flexion  3+/5    Left Knee Extension  4-/5      Flexibility   Soft Tissue Assessment /Muscle Length  yes    Hamstrings  limited by 60%      Palpation  Palpation comment  tender bil quadratus lumborom, lumbar paraspinals      Special Tests    Special Tests  Lumbar    Lumbar Tests  Straight  Leg Raise      Straight Leg Raise   Findings  Positive    Side   --   bil   Comment  at approx 40 deg for pain below knee      Bed Mobility   Bed Mobility  Sit to Sidelying Left;Rolling Right;Rolling Left;Left Sidelying to Sit    Rolling Right  Supervision/verbal cueing;Independent    Rolling Left  Supervision/Verbal cueing;Independent    Left Sidelying to Sit  Supervision/Verbal cueing;Independent    Sit to Sidelying Left  Supervision/Verbal cueing;Independent      Ambulation/Gait   Ambulation/Gait  Yes    Ambulation/Gait Assistance  7: Independent    Gait Comments  wide stance with mild out toeing gait                Objective measurements completed on examination: See above findings.              PT Education - 02/08/19 1535    Education Details  Access Code: [email or web address]    Person(s) Educated  Patient    Methods  Explanation;Demonstration;Verbal cues    Comprehension  Verbalized understanding;Returned demonstration       PT Short Term Goals - 02/08/19 1847      PT SHORT TERM GOAL #1   Title  Pt will be ind in initial HEP for mobility, flexibility, strength and stabilization.    Time  4    Period  Weeks    Status  New    Target Date  03/08/19      PT SHORT TERM GOAL #2   Title  Pt will demo proper body mechanics with improved core use for bed mobility and bending/lifting simulation of caregiving for his wife.    Time  4    Period  Weeks    Status  New    Target Date  03/08/19      PT SHORT TERM GOAL #3   Title  Pt will be able to perform standing household activities for at least 20 minutes with pain not to exceed 4/10.    Time  4    Period  Weeks    Status  New    Target Date  03/08/19      PT SHORT TERM GOAL #4   Title  Pt will report compliance in breaking up seated activities every 30 minutes during the day to improve stiffness and pain.    Time  4    Period  Weeks    Status  New    Target Date  03/08/19        PT Long Term Goals  - 02/08/19 1850      PT LONG TERM GOAL #1   Title  Pt will be ind in advanced HEP for mobility, flexibility, strength, stabilization, balance and endurance to maintain gains made in PT.    Time  8    Period  Weeks    Status  New    Target Date  04/05/19      PT LONG TERM GOAL #2   Title  Pt will report overall reduction of pain by at least 60%.    Time  8    Period  Weeks    Status  New    Target Date  04/05/19  PT LONG TERM GOAL #3   Title  Pt will be able to perform standing household and community activties for at least 45 minutes with pain not to exceed 3/10.    Time  8    Period  Weeks    Status  New    Target Date  04/05/19      PT LONG TERM GOAL #4   Title  Pt will reduce FOTO score to </= 32% to demo improved tolerance of daily tasks.    Baseline  44%    Time  8    Period  Weeks    Status  New    Target Date  04/05/19      PT LONG TERM GOAL #5   Title  Pt will achieve strength rating of at least 4+/5 throughout bil LEs for improved performance of daily demands.    Time  8    Period  Weeks    Status  New    Target Date  04/05/19             Plan - 02/08/19 1813    Clinical Impression Statement  Pt is a pleasant 85yo male who strained his back approx. 2-3 months ago while trying to lift his wife who had fallen.  He provides caregiving for her for dressing, transfers and meal prep.  His pain is across the low back, mostly in bil flank regions, and is described as achy but can sometimes be sharp as with performing bed mobility.  He is most limited in walking (household distances before pain), but also in standing (30 min) and sitting (3-4 hours).  He admits he doesn't exercise and sits at home most of the time due to his wife being in a wheelchair for now.  He has significant diffuse restrictions in ROM and flexibility of trunk and bil LEs.  SLR is + bil at approx. 40 degrees for pain below the knee.  He has poor core awareness and PT observed mild abdominal  hernia with bed mobility despite core cueing today.  PT discussed possible use of lumbar brace and gait belt while helping with his wife until he gets stronger, both of which Pt has, although he wasn't too keen on using them.  Pt prefers HEP to be standing or sitting as he has difficulty laying down due to sharp pain and rising from supine.  Pt will benefit from Pt education for caregiver techniques and HEP, stabilization, stretching, strengthening, mobility exercises for ROM, and modalities as needed to reduce pain and improve tolerance of daily activities and demands.  (Pended)     Personal Factors and Comorbidities  Age;Fitness  (Pended)     Examination-Activity Limitations  Bed Mobility;Lift;Bend;Squat;Carry;Locomotion Level;Stand  (Pended)     Examination-Participation Restrictions  Shop;Cleaning;Laundry  (Pended)     Stability/Clinical Decision Making  Stable/Uncomplicated  (Pended)     Clinical Decision Making  Low  (Pended)     Rehab Potential  Good  (Pended)     PT Frequency  1x / week  (Pended)    Pt prefers to space out appointments   PT Duration  8 weeks  (Pended)     PT Treatment/Interventions  ADLs/Self Care Home Management;Cryotherapy;Electrical Stimulation;Iontophoresis 4mg /ml Dexamethasone;Moist Heat;Traction;Functional mobility training;Therapeutic activities;Therapeutic exercise;Balance training;Neuromuscular re-education;Patient/family education;Manual techniques;Dry needling;Passive range of motion;Taping;Spinal Manipulations;Joint Manipulations  (Pended)     PT Next Visit Plan  f/u on HEP (seated stretch/ROM), update HEP to include seated/standing strength as tolerated for hips, quads, hamstrings, postural strength,  core education, body mechanics for lifting/assisting wife with transfers  (Pended)     PT Home Exercise Plan  Access Code: [email or web address]  (Pended)     Consulted and Agree with Plan of Care  Patient  (Pended)        Patient will benefit from skilled therapeutic  intervention in order to improve the following deficits and impairments:  (P) Increased fascial restricitons, Improper body mechanics, Pain, Decreased mobility, Postural dysfunction, Decreased activity tolerance, Decreased endurance, Decreased range of motion, Decreased strength, Hypomobility, Decreased balance, Difficulty walking, Impaired flexibility  Visit Diagnosis: Chronic bilateral low back pain without sciatica - Plan: PT plan of care cert/re-cert  Muscle weakness (generalized) - Plan: PT plan of care cert/re-cert     Problem List Patient Active Problem List   Diagnosis Date Noted  . Hyperlipidemia 09/27/2018  . Carpal tunnel syndrome on left 05/31/2016  . 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 Parotid mass 11/17/2015  . Carpal tunnel syndrome, right 08/18/2015  . Unspecified hereditary and idiopathic peripheral neuropathy 06/18/2014  . Lumbar spinal stenosis 11/09/2013  . ESOPHAGEAL REFLUX 04/10/2010  . HYPERTENSION, PULMONARY 04/07/2010  . UNSPECIFIED HEARING LOSS 08/07/2008  . HYPERTENSION, BENIGN 08/07/2008  . BPH (benign prostatic hyperplasia) 08/07/2008  . SCIATICA 08/07/2008    Windsor Zirkelbach, PT 02/08/19 7:10 PM   [facility] [address], [place], [place], [address] Phone: [phone]   Fax:  [phone]  Name: Glenn Ortega MRN: [id number] Date of Birth: 11-Jan-1934

## 2019-02-08 NOTE — Patient Instructions (Signed)
Access Code: [email or web address]  URL: [email or web address]  Date: 02/08/2019  Prepared by: Venetia Night Beuhring   Exercises  Seated Hamstring Stretch - 3 reps - 2 sets - 20 hold - 1x daily - 7x weekly  Seated Sidebending Arms Overhead - 5 reps - 2 sets - 5 hold - 1x daily - 7x weekly  Seated Piriformis Stretch - 3 reps - 3 sets - 20 hold - 2x daily - 7x weekly  Seated Figure 4 Piriformis Stretch - 3 reps - 2 sets - 20 hold - 2x daily - 7x weekly  Seated Transversus Abdominis Bracing - 10 reps - 2 sets - 10 hold - 1x daily - 7x weekly

## 2019-02-22 ENCOUNTER — Encounter: Payer: Self-pay | Admitting: Physical Therapy

## 2019-02-22 ENCOUNTER — Ambulatory Visit: Payer: Medicare HMO | Attending: Adult Health | Admitting: Physical Therapy

## 2019-02-22 ENCOUNTER — Other Ambulatory Visit: Payer: Self-pay

## 2019-02-22 DIAGNOSIS — M6281 Muscle weakness (generalized): Secondary | ICD-10-CM | POA: Diagnosis not present

## 2019-02-22 DIAGNOSIS — G8929 Other chronic pain: Secondary | ICD-10-CM | POA: Diagnosis not present

## 2019-02-22 DIAGNOSIS — M545 Low back pain, unspecified: Secondary | ICD-10-CM

## 2019-02-22 NOTE — Patient Instructions (Signed)
Access Code: [email or web address]  URL: [email or web address]  Date: 02/22/2019  Prepared by: Ruben Im   Exercises  Seated Hamstring Stretch - 3 reps - 2 sets - 20 hold - 1x daily - 7x weekly  Seated Sidebending Arms Overhead - 5 reps - 2 sets - 5 hold - 1x daily - 7x weekly  Seated Piriformis Stretch - 3 reps - 3 sets - 20 hold - 2x daily - 7x weekly  Seated Figure 4 Piriformis Stretch - 3 reps - 2 sets - 20 hold - 2x daily - 7x weekly  Seated Transversus Abdominis Bracing - 10 reps - 2 sets - 10 hold - 1x daily - 7x weekly  Supine Single Knee to Chest Stretch - 3 reps - 1 sets - 30 hold - 1x daily - 7x weekly  Supine Lower Trunk Rotation - 3 reps - 1 sets - 30 hold - 1x daily - 7x weekly  Supine Transversus Abdominis Bracing with Pelvic Floor Contraction - 10 reps - 1 sets - 1x daily - 7x weekly  Hooklying Isometric Hip Flexion - 5 reps - 1 sets - 1x daily - 7x weekly  Hooklying Clamshell with Resistance - 10 reps - 1 sets - 1x daily - 7x weekly  Supine Hip Adduction Isometric with Ball - 10 reps - 1 sets - 1x daily - 7x weekly  Sit to Stand with Arms Crossed - 10 reps - 1 sets - 1x daily - 7x weekly    Ruben Im PT

## 2019-02-22 NOTE — Therapy (Addendum)
[facility] [address] [place], [place], [address] Phone: [phone]   Fax:  [phone]  Physical Therapy Treatment/Discharge Summary   Patient Details  Name: Glenn Ortega MRN: [id number] Date of Birth: 02-17-1934 Referring Provider (PT): Gregor Hams, MD,   Encounter Date: 02/22/2019  PT End of Session - 02/22/19 1206    Visit Number  2    Date for PT Re-Evaluation  04/05/19    Authorization Type  Aetna Medicare    PT Start Time  1000    PT Stop Time  1040    PT Time Calculation (min)  40 min    Activity Tolerance  Patient tolerated treatment well       Past Medical History:  Diagnosis Date  . GERD (gastroesophageal reflux disease)   . Hyperlipidemia   . Hypertension    EKG and chest x ray 8/12 EPIC/states had stress test 2 yrs ago- doesnt remember where- not in EPIC  . Spinal stenosis    lumbar    Past Surgical History:  Procedure Laterality Date  . COLONOSCOPY    . CYSTOSCOPY  12/30/2011   Procedure: CYSTOSCOPY;  Surgeon: Reece Packer, MD;  Location: [facility];  Service: Urology;  Laterality: N/A;  . EYE SURGERY Bilateral 06/18/2017   cataract sx with lens implant  . HERNIA REPAIR  2013  . HERNIA REPAIR Right    age  67  . LUMBAR LAMINECTOMY/DECOMPRESSION MICRODISCECTOMY N/A 11/09/2013   Procedure: Lumbar Laminectomy/Decompression, Microdiscectomy Lumbar Three-Four, Four-Five, with Coflex;  Surgeon: Faythe Ghee, MD;  Location: [facility];  Service: Neurosurgery;  Laterality: N/A;  Lumbar Laminectomy/Decompression, Microdiscectomy Lumbar Three-Four, Four-Five, with Coflex  . PAROTIDECTOMY Left 11/17/2015   Procedure: LEFT PAROTIDECTOMY;  Surgeon: Izora Gala, MD;  Location: [place];  Service: ENT;  Laterality: Left;  . PROSTATE SURGERY  2013  . TONSILLECTOMY    . trigger thumb  1980   right    There were no vitals filed for this visit.  Subjective Assessment - 02/22/19  1001    Subjective  Denies pain today.  States he is still assisting his wife in/out of the wheelchair.  Some good days, some bad days.  Been doing ex's 1-2x/day, they're pretty simple.  Lying in bed has gotten better lately since working the core muscles.      How long can you sit comfortably?  3-4 hours    How long can you walk comfortably?  100 feet    Diagnostic tests  10/2018 xrays: L3-L5 fusion, mod DDD L4/5    Currently in Pain?  No/denies    Pain Score  0-No pain    Pain Orientation  Lower;Left;Right    Aggravating Factors   getting clothes out of the dryer, standing 20-30 minutes and back feels like it's giving out;                        [facility] Adult PT Treatment/Exercise - 02/22/19 0001      Lumbar Exercises: Stretches   Active Hamstring Stretch  Right;Left;1 rep    Active Hamstring Stretch Limitations  seated    Single Knee to Chest Stretch  Right;Left;3 reps    Lower Trunk Rotation  3 reps    Other Lumbar Stretch Exercise  review of seated ex from initial HEP       Lumbar Exercises: Aerobic   Nustep  L1 6 min seat 9, arms 10  Lumbar Exercises: Seated   Sit to Stand  10 reps    Sit to Stand Limitations  no UEs      Lumbar Exercises: Supine   Ab Set  10 reps    Isometric Hip Flexion  10 reps    Other Supine Lumbar Exercises  green band clams double and single 10x    Other Supine Lumbar Exercises  ball squeeze 10x              PT Education - 02/22/19 1030    Education Details   Access Code: [email or web address], lumbar rotation, supine abdominal brace, hand to knee push, supine clams, supine pillow squeeze; sit to stand no hands    Person(s) Educated  Patient    Methods  Explanation;Demonstration;Handout    Comprehension  Returned demonstration;Verbalized understanding       PT Short Term Goals - 02/08/19 1847      PT SHORT TERM GOAL #1   Title  Pt will be ind in initial HEP for mobility, flexibility, strength and stabilization.    Time  4     Period  Weeks    Status  New    Target Date  03/08/19      PT SHORT TERM GOAL #2   Title  Pt will demo proper body mechanics with improved core use for bed mobility and bending/lifting simulation of caregiving for his wife.    Time  4    Period  Weeks    Status  New    Target Date  03/08/19      PT SHORT TERM GOAL #3   Title  Pt will be able to perform standing household activities for at least 20 minutes with pain not to exceed 4/10.    Time  4    Period  Weeks    Status  New    Target Date  03/08/19      PT SHORT TERM GOAL #4   Title  Pt will report compliance in breaking up seated activities every 30 minutes during the day to improve stiffness and pain.    Time  4    Period  Weeks    Status  New    Target Date  03/08/19        PT Long Term Goals - 02/08/19 1850      PT LONG TERM GOAL #1   Title  Pt will be ind in advanced HEP for mobility, flexibility, strength, stabilization, balance and endurance to maintain gains made in PT.    Time  8    Period  Weeks    Status  New    Target Date  04/05/19      PT LONG TERM GOAL #2   Title  Pt will report overall reduction of pain by at least 60%.    Time  8    Period  Weeks    Status  New    Target Date  04/05/19      PT LONG TERM GOAL #3   Title  Pt will be able to perform standing household and community activties for at least 45 minutes with pain not to exceed 3/10.    Time  8    Period  Weeks    Status  New    Target Date  04/05/19      PT LONG TERM GOAL #4   Title  Pt will reduce FOTO score to </= 32% to demo improved tolerance of daily tasks.  Baseline  44%    Time  8    Period  Weeks    Status  New    Target Date  04/05/19      PT LONG TERM GOAL #5   Title  Pt will achieve strength rating of at least 4+/5 throughout bil LEs for improved performance of daily demands.    Time  8    Period  Weeks    Status  New    Target Date  04/05/19            Plan - 02/22/19 1041    Clinical Impression  Statement  The patient denies pain pain during treatment session and reports only mild discomfort as he is exiting.  He is now able to tolerate supine lying and supine to sitting with much greater ease compared to initial visit.  He reports his wife needs hand hand assistance at times but he does not have to lift  her anymore.  He was instructed in a progression of his HEP and requests to hold on PT for 2-3 weeks as he has to take his wife to therapy and numerous appts for her.  He agreed to call for follow up/status update at that time to determine if PT is still needed or if ready for discharge.      Rehab Potential  Good    PT Frequency  1x / week    PT Duration  8 weeks    PT Treatment/Interventions  Therapeutic activities;Therapeutic exercise;Patient/family education;Manual techniques;Electrical Stimulation;Moist Heat    PT Next Visit Plan  patient to call in 2-3 weeks to determine if additional PT is needed    PT Home Exercise Plan   Access Code: [email or web address]     Consulted and Agree with Plan of Care  Patient       Patient will benefit from skilled therapeutic intervention in order to improve the following deficits and impairments:     Visit Diagnosis: Chronic bilateral low back pain without sciatica  Muscle weakness (generalized)  The patient has been informed of current processes in place at Outpatient Rehab to protect patients from Covid-19 exposure including social distancing, schedule modifications, and new cleaning procedures. After discussing their particular risk with a therapist based on the patient's personal risk factors, the patient has decided to proceed with in-person therapy.    PHYSICAL THERAPY DISCHARGE SUMMARY  Visits from Start of Care: 2  Current functional level related to goals / functional outcomes: The patient last attended PT in May.  He has not called to schedule any more appointments.  Left a message > 4 weeks ago.  Will discharge from PT at this time.     Remaining deficits: As above   Education / Equipment: Basic HEP Plan:                                                    Patient goals were not met. Patient is being discharged due to not returning since the last visit.  ?????      Problem List Patient Active Problem List   Diagnosis Date Noted  . Hyperlipidemia 09/27/2018  . Carpal tunnel syndrome on left 05/31/2016  . 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 Parotid mass 11/17/2015  . Carpal tunnel syndrome, right 08/18/2015  .  Unspecified hereditary and idiopathic peripheral neuropathy 06/18/2014  . Lumbar spinal stenosis 11/09/2013  . ESOPHAGEAL REFLUX 04/10/2010  . HYPERTENSION, PULMONARY 04/07/2010  . UNSPECIFIED HEARING LOSS 08/07/2008  . HYPERTENSION, BENIGN 08/07/2008  . BPH (benign prostatic hyperplasia) 08/07/2008  . SCIATICA 08/07/2008   Ruben Im, PT 02/22/19 12:13 PM Phone: [phone] Fax: [phone] Alvera Singh 02/22/2019, 12:12 PM  [facility] [address], [place], [place], [address] Phone: [phone]   Fax:  [phone]  Name: ATHANASIUS KESLING MRN: [id number] Date of Birth: 1934-01-27

## 2019-03-15 ENCOUNTER — Telehealth: Payer: Self-pay | Admitting: Adult Health

## 2019-03-15 ENCOUNTER — Telehealth: Payer: Self-pay | Admitting: Physical Therapy

## 2019-03-15 MED ORDER — NAPROXEN 375 MG PO TABS: 375.0000 mg | ORAL_TABLET | Freq: Two times a day (BID) | ORAL | 0 refills | Status: DC

## 2019-03-15 MED ORDER — NAPROXEN 375 MG PO TABS: 375.0000 mg | ORAL_TABLET | Freq: Two times a day (BID) | ORAL | 0 refills | Status: AC

## 2019-03-15 NOTE — Telephone Encounter (Signed)
Sent to the pharmacy by e-scribe. 

## 2019-03-15 NOTE — Telephone Encounter (Signed)
Ok to send in for 30 days  

## 2019-03-15 NOTE — Addendum Note (Signed)
Addended by: Miles Costain T on: 03/15/2019 01:56 PM   Modules accepted: Orders

## 2019-03-15 NOTE — Telephone Encounter (Signed)
Left message to determine if he would be returning to PT.

## 2019-03-15 NOTE — Telephone Encounter (Signed)
The patient called stating that he is having neck spasms. I was going to schedule a virtual visit with him for tomorrow and he just wants Tommi Rumps to call him in   naproxen (NAPROSYN) 375 MG tablet   I asked him if Tommi Rumps has seen him for this syptom before and he said yes and just needs it sent to:  [facility] [address], [place] - [place] [phone] (Phone) [phone] (Fax)

## 2019-04-12 DIAGNOSIS — M2041 Other hammer toe(s) (acquired), right foot: Secondary | ICD-10-CM | POA: Diagnosis not present

## 2019-04-12 DIAGNOSIS — I739 Peripheral vascular disease, unspecified: Secondary | ICD-10-CM | POA: Diagnosis not present

## 2019-04-12 DIAGNOSIS — M2042 Other hammer toe(s) (acquired), left foot: Secondary | ICD-10-CM | POA: Diagnosis not present

## 2019-04-12 DIAGNOSIS — B351 Tinea unguium: Secondary | ICD-10-CM | POA: Diagnosis not present

## 2019-04-27 DIAGNOSIS — Z85828 Personal history of other malignant neoplasm of skin: Secondary | ICD-10-CM | POA: Diagnosis not present

## 2019-04-27 DIAGNOSIS — D2261 Melanocytic nevi of right upper limb, including shoulder: Secondary | ICD-10-CM | POA: Diagnosis not present

## 2019-04-27 DIAGNOSIS — L821 Other seborrheic keratosis: Secondary | ICD-10-CM | POA: Diagnosis not present

## 2019-04-27 DIAGNOSIS — L570 Actinic keratosis: Secondary | ICD-10-CM | POA: Diagnosis not present

## 2019-04-27 DIAGNOSIS — D2262 Melanocytic nevi of left upper limb, including shoulder: Secondary | ICD-10-CM | POA: Diagnosis not present

## 2019-04-27 DIAGNOSIS — D2272 Melanocytic nevi of left lower limb, including hip: Secondary | ICD-10-CM | POA: Diagnosis not present

## 2019-04-27 DIAGNOSIS — D225 Melanocytic nevi of trunk: Secondary | ICD-10-CM | POA: Diagnosis not present

## 2019-04-27 DIAGNOSIS — D1801 Hemangioma of skin and subcutaneous tissue: Secondary | ICD-10-CM | POA: Diagnosis not present

## 2019-05-22 ENCOUNTER — Telehealth (INDEPENDENT_AMBULATORY_CARE_PROVIDER_SITE_OTHER): Payer: Medicare HMO | Admitting: Adult Health

## 2019-05-22 ENCOUNTER — Other Ambulatory Visit: Payer: Self-pay

## 2019-05-22 DIAGNOSIS — Z880 Allergy status to penicillin: Secondary | ICD-10-CM | POA: Diagnosis not present

## 2019-05-22 DIAGNOSIS — B309 Viral conjunctivitis, unspecified: Secondary | ICD-10-CM | POA: Diagnosis not present

## 2019-05-22 DIAGNOSIS — I10 Essential (primary) hypertension: Secondary | ICD-10-CM | POA: Diagnosis not present

## 2019-05-22 DIAGNOSIS — E785 Hyperlipidemia, unspecified: Secondary | ICD-10-CM | POA: Diagnosis not present

## 2019-05-22 DIAGNOSIS — E039 Hypothyroidism, unspecified: Secondary | ICD-10-CM | POA: Diagnosis not present

## 2019-05-22 NOTE — Progress Notes (Signed)
Virtual Visit via Telephone Note  I connected with Glenn Ortega on 05/22/19 at  4:30 PM EDT by telephone and verified that I am speaking with the correct person using two identifiers.   I discussed the limitations, risks, security and privacy concerns of performing an evaluation and management service by telephone and the availability of in person appointments. I also discussed with the patient that there may be a patient responsible charge related to this service. The patient expressed understanding and agreed to proceed.  Location patient: home Location provider: work or home office Participants present for the call: patient, provider Patient did not have a visit in the prior 7 days to address this/these issue(s).   History of Present Illness: 85 year old male who is being evaluated today for concern of "pinkeye".  His symptoms started approximately 4 days ago and he feels as though they are improving.  Symptoms include redness in both eyes but greater in the left, clear drainage from both eyes, and itchiness in both eyes.  He denies fevers, chills or changes in vision or purulent discharge.  He has been using liquid tears.    Observations/Objective: Patient sounds cheerful and well on the phone. I do not appreciate any SOB. Speech and thought processing are grossly intact. Patient reported vitals:  Assessment and Plan: 1. Acute viral conjunctivitis of both eyes -Exam more consistent with viral conjunctivitis.  Encouraged cool compress, wash hands frequently, can continue to use liquid tears or Visine allergy to help with discomfort.  Red flags given and he was advised follow-up if needed.   Follow Up Instructions:  I did not refer this patient for an OV in the next 24 hours for this/these issue(s).  I discussed the assessment and treatment plan with the patient. The patient was provided an opportunity to ask questions and all were answered. The patient agreed with the plan and  demonstrated an understanding of the instructions.   The patient was advised to call back or seek an in-person evaluation if the symptoms worsen or if the condition fails to improve as anticipated.  I provided 15 minutes of non-face-to-face time during this encounter.   [name], NP

## 2019-06-19 DIAGNOSIS — R69 Illness, unspecified: Secondary | ICD-10-CM | POA: Diagnosis not present

## 2019-07-12 DIAGNOSIS — I739 Peripheral vascular disease, unspecified: Secondary | ICD-10-CM | POA: Diagnosis not present

## 2019-07-12 DIAGNOSIS — B351 Tinea unguium: Secondary | ICD-10-CM | POA: Diagnosis not present

## 2019-09-17 ENCOUNTER — Ambulatory Visit (INDEPENDENT_AMBULATORY_CARE_PROVIDER_SITE_OTHER): Payer: Medicare HMO | Admitting: Sports Medicine

## 2019-09-17 ENCOUNTER — Ambulatory Visit (INDEPENDENT_AMBULATORY_CARE_PROVIDER_SITE_OTHER): Payer: Medicare HMO

## 2019-09-17 ENCOUNTER — Encounter: Payer: Self-pay | Admitting: Sports Medicine

## 2019-09-17 ENCOUNTER — Other Ambulatory Visit: Payer: Self-pay

## 2019-09-17 DIAGNOSIS — M1711 Unilateral primary osteoarthritis, right knee: Secondary | ICD-10-CM | POA: Diagnosis not present

## 2019-09-17 DIAGNOSIS — M1712 Unilateral primary osteoarthritis, left knee: Secondary | ICD-10-CM

## 2019-09-17 DIAGNOSIS — G5601 Carpal tunnel syndrome, right upper limb: Secondary | ICD-10-CM

## 2019-09-17 DIAGNOSIS — M25562 Pain in left knee: Secondary | ICD-10-CM | POA: Diagnosis not present

## 2019-09-17 DIAGNOSIS — M170 Bilateral primary osteoarthritis of knee: Secondary | ICD-10-CM | POA: Insufficient documentation

## 2019-09-17 NOTE — Assessment & Plan Note (Signed)
Right median nerve hydrodissection.

## 2019-09-17 NOTE — Progress Notes (Signed)
Subjective:    CC: Several issues  HPI: Right carpal tunnel syndrome, left knee pain.  Glenn Ortega is a pleasant 85 year old male, he has been taking care of his wife more lately, she has lost the ability to walk.  Unfortunately he has also developed pain in his left knee, lateral aspect, mild buckling, moderate, persistent, localized without radiation.  He also has a history of carpal tunnel syndrome, the last injection was with Dr. Georgina Snell back in March, now having recurrence of paresthesias into the hand, moderate, worsening, localized with radiation into the volar forearm.  I reviewed the past medical history, family history, social history, surgical history, and allergies today and no changes were needed.  Please see the problem list section below in epic for further details.  Past Medical History: Past Medical History:  Diagnosis Date  . GERD (gastroesophageal reflux disease)   . Hyperlipidemia   . Hypertension    EKG and chest x ray 8/12 [facility]/states had stress test 2 yrs ago- doesnt remember where- not in EPIC  . Spinal stenosis    lumbar   Past Surgical History: Past Surgical History:  Procedure Laterality Date  . COLONOSCOPY    . CYSTOSCOPY  12/30/2011   Procedure: CYSTOSCOPY;  Surgeon: Reece Packer, MD;  Location: [facility];  Service: Urology;  Laterality: N/A;  . EYE SURGERY Bilateral 06/18/2017   cataract sx with lens implant  . HERNIA REPAIR  2013  . HERNIA REPAIR Right    age  6  . LUMBAR LAMINECTOMY/DECOMPRESSION MICRODISCECTOMY N/A 11/09/2013   Procedure: Lumbar Laminectomy/Decompression, Microdiscectomy Lumbar Three-Four, Four-Five, with Coflex;  Surgeon: Faythe Ghee, MD;  Location: [facility];  Service: Neurosurgery;  Laterality: N/A;  Lumbar Laminectomy/Decompression, Microdiscectomy Lumbar Three-Four, Four-Five, with Coflex  . PAROTIDECTOMY Left 11/17/2015   Procedure: LEFT PAROTIDECTOMY;  Surgeon: Izora Gala, MD;  Location: [facility];   Service: ENT;  Laterality: Left;  . PROSTATE SURGERY  2013  . TONSILLECTOMY    . trigger thumb  1980   right   Social History: Social History   Socioeconomic History  . Marital status: Married    Spouse name: [name]  . Number of children: Not on file  . Years of education: Not on file  . Highest education level: Not on file  Occupational History  . Occupation: Estate agent: [employer]  . Financial resource strain: Not on file  . Food insecurity    Worry: Not on file    Inability: Not on file  . Transportation needs    Medical: Not on file    Non-medical: Not on file  Tobacco Use  . Smoking status: Never Smoker  . Smokeless tobacco: Never Used  . Tobacco comment: per patient he never smoke. 11/25/2014 /rc  Substance and Sexual Activity  . Alcohol use: Yes    Alcohol/week: 2.0 standard drinks    Types: 2 Standard drinks or equivalent per week    Comment: socially- occ beer  . Drug use: No  . Sexual activity: Not Currently  Lifestyle  . Physical activity    Days per week: Not on file    Minutes per session: Not on file  . Stress: Not on file  Relationships  . Social Herbalist on phone: Not on file    Gets together: Not on file    Attends religious service: Not on file    Active member of club or organization: Not on  file    Attends meetings of clubs or organizations: Not on file    Relationship status: Not on file  Other Topics Concern  . Not on file  Social History Narrative   Works 3 nights a week at [employer].    Previously worked as a Freight forwarder for [employer].   Married to [place]. 2 caffeinated drinks per day. Does not exercise he says secondary to back problems.  Lives in a one story home.     Has 2 children.  Education: some college. He is an is in [facility] from [place]   Family History: Family History  Problem Relation Age of Onset  . Hypertension Unknown   . Cancer Mother        bone?  . Myasthenia  gravis Father   . Heart disease Brother    Allergies: Allergies  Allergen Reactions  . Penicillins Swelling    CHILDHOOD ALLERGY Has patient had a PCN reaction causing immediate rash, facial/tongue/throat swelling, SOB or lightheadedness with hypotension: Yes Has patient had a PCN reaction causing severe rash involving mucus membranes or skin necrosis: No Has patient had a PCN reaction that required hospitalization: No Has patient had a PCN reaction occurring within the last 10 years: No If all of the above answers are "NO", then may proceed with Cephalosporin use.   . Gabapentin     "MADE ME FEEL CRAZY"   Medications: See med rec.  Review of Systems: No fevers, chills, night sweats, weight loss, chest pain, or shortness of breath.   Objective:    General: Well Developed, well nourished, and in no acute distress.  Neuro: Alert and oriented x3, extra-ocular muscles intact, sensation grossly intact.  HEENT: Normocephalic, atraumatic, pupils equal round reactive to light, neck supple, no masses, no lymphadenopathy, thyroid nonpalpable.  Skin: Warm and dry, no rashes. Cardiac: Regular rate and rhythm, no murmurs rubs or gallops, no lower extremity edema.  Respiratory: Clear to auscultation bilaterally. Not using accessory muscles, speaking in full sentences. Right wrist: Inspection normal with no visible erythema or swelling. ROM smooth and normal with good flexion and extension and ulnar/radial deviation that is symmetrical with opposite wrist. Palpation is normal over metacarpals, navicular, lunate, and TFCC; tendons without tenderness/ swelling No snuffbox tenderness. No tenderness over Canal of [place]. Strength 5/5 in all directions without pain. Positive tinel's and phalens signs. Negative Finkelstein sign. Negative Watson's test. Left knee: Mild swelling, tender at the joint line Palpation normal with no warmth or joint line tenderness or patellar tenderness or condyle  tenderness. ROM normal in flexion and extension and lower leg rotation. Ligaments with solid consistent endpoints including ACL, PCL, LCL, MCL. Negative Mcmurray's and provocative meniscal tests. Non painful patellar compression. Patellar and quadriceps tendons unremarkable. Hamstring and quadriceps strength is normal.  Procedure: Real-time Ultrasound Guided  aspiration/injection of the left knee Device: Samsung HS60  Verbal informed consent obtained.  Time-out conducted.  Noted no overlying erythema, induration, or other signs of local infection.  Skin prepped in a sterile fashion.  Local anesthesia: Topical Ethyl chloride.  With sterile technique and under real time ultrasound guidance:  Using an 18-gauge needle aspirated 9 cc of clear, straw-colored fluid, syringe switched and 1 cc Kenalog 40, 2 cc lidocaine, 2 cc bupivacaine injected easily Completed without difficulty  Pain immediately resolved suggesting accurate placement of the medication.  Advised to call if fevers/chills, erythema, induration, drainage, or persistent bleeding.  Images permanently stored and available for review in  the ultrasound unit.  Impression: Technically successful ultrasound guided injection.  Procedure: Real-time Ultrasound Guided hydrodissection of the right median nerve at the carpal tunnel Device: Samsung HS60 Verbal informed consent obtained.  Time-out conducted.  Noted no overlying erythema, induration, or other signs of local infection.  Skin prepped in a sterile fashion.  Local anesthesia: Topical Ethyl chloride.  With sterile technique and under real time ultrasound guidance: Using a 25-gauge needle advanced into the carpal tunnel, taking care to avoid intraneural injection I injected medication both superficial to and deep to the median nerve freeing it from surrounding structures, I then redirected the needle deep and injected further medication around the flexor tendons deep within the carpal  tunnel for a total of 1 cc kenalog 40, 5 cc 1% lidocaine without epinephrine. Completed without difficulty  Advised to call if fevers/chills, erythema, induration, drainage, or persistent bleeding.  Images permanently stored and available for review in the ultrasound unit.  Impression: Technically successful ultrasound guided median nerve hydrodissection.  Impression and Recommendations:    Primary osteoarthritis of left knee Aspiration and injection as above, x-rays, formal PT  Carpal tunnel syndrome, right Right median nerve hydrodissection.   ___________________________________________ Gwen Her. Dianah Field, M.D., ABFM., CAQSM. Primary Care and Sports Medicine [facility]  Adjunct Professor of [employer] [facility] [employer]

## 2019-09-17 NOTE — Assessment & Plan Note (Addendum)
Aspiration and injection as above, x-rays, formal PT

## 2019-09-27 ENCOUNTER — Telehealth: Payer: Self-pay | Admitting: Adult Health

## 2019-09-27 NOTE — Telephone Encounter (Signed)
Left  vm for patient to callback and let us know which medication he is needing

## 2019-09-27 NOTE — Telephone Encounter (Signed)
Returned call to patient who states that he was calling to request a refill for his wife.

## 2019-09-27 NOTE — Telephone Encounter (Signed)
Medication Refill - Medication:  ° ° ° °Preferred Pharmacy (with phone number or street name):  ° °Agent: Please be advised that RX refills may take up to 3 business days. We ask that you follow-up with your pharmacy. ° °

## 2019-10-05 ENCOUNTER — Encounter: Payer: Self-pay | Admitting: Physical Therapy

## 2019-10-05 ENCOUNTER — Ambulatory Visit: Payer: Medicare HMO | Attending: Sports Medicine | Admitting: Physical Therapy

## 2019-10-05 ENCOUNTER — Other Ambulatory Visit: Payer: Self-pay

## 2019-10-05 DIAGNOSIS — M25562 Pain in left knee: Secondary | ICD-10-CM | POA: Diagnosis not present

## 2019-10-05 DIAGNOSIS — G8929 Other chronic pain: Secondary | ICD-10-CM | POA: Insufficient documentation

## 2019-10-05 DIAGNOSIS — M6281 Muscle weakness (generalized): Secondary | ICD-10-CM | POA: Diagnosis present

## 2019-10-05 NOTE — Patient Instructions (Signed)
Access Code: [email or web address]  URL: [email or web address]  Date: 10/05/2019  Prepared by: Venetia Night Holleigh Crihfield   Exercises  Sit to Stand with Arms Crossed - 5 reps - 3x daily - 7x weekly  Seated Long Arc Quad - 10 reps - 3 sets - 1x daily - 7x weekly  Sitting Knee Extension with Resistance - 10 reps - 3 sets - 1x daily - 7x weekly  Seated March with Resistance - 10 reps - 3 sets - 1x daily - 7x weekly  Seated Hip Abduction with Resistance - 10 reps - 3 sets - 1x daily - 7x weekly  Seated Hamstring Stretch - 2 reps - 1 sets - 30 hold - 2x daily - 7x weekly  Active Straight Leg Raise with Quad Set - 10 reps - 3 sets - 1x daily - 7x weekly  Supine Quadricep Sets - 10 reps - 2 sets - 5 hold - 1x daily - 7x weekly  Heel rises with counter support - 10 reps - 3 sets - 1x daily - 7x weekly  Standing Knee Flexion - 10 reps - 3 sets - 1x daily - 7x weekly  Forward Step Up - 5 reps - 3 sets - 1x daily - 7x weekly  Forward Step Down with Counter Support at Side - 5 reps - 3 sets - 1x daily - 7x weekly

## 2019-10-05 NOTE — Therapy (Signed)
[facility] [address], [place], [place], [address] Phone: [phone]   Fax:  [phone]  Physical Therapy Evaluation  Patient Details  Name: Glenn Ortega MRN: [id number] Date of Birth: Jun 28, 1934 Referring Provider (PT): Silverio Decamp, MD   Encounter Date: 10/05/2019  PT End of Session - 10/05/19 1047    Visit Number  1    Date for PT Re-Evaluation  11/16/19    Authorization Type  [employer] Medicare    PT Start Time  1000    PT Stop Time  1050    PT Time Calculation (min)  50 min    Activity Tolerance  Patient tolerated treatment well;No increased pain    Behavior During Therapy  WFL for tasks assessed/performed       Past Medical History:  Diagnosis Date  . GERD (gastroesophageal reflux disease)   . Hyperlipidemia   . Hypertension    EKG and chest x ray 8/12 [facility]/states had stress test 2 yrs ago- doesnt remember where- not in EPIC  . Spinal stenosis    lumbar    Past Surgical History:  Procedure Laterality Date  . COLONOSCOPY    . CYSTOSCOPY  12/30/2011   Procedure: CYSTOSCOPY;  Surgeon: Reece Packer, MD;  Location: [facility];  Service: Urology;  Laterality: N/A;  . EYE SURGERY Bilateral 06/18/2017   cataract sx with lens implant  . HERNIA REPAIR  2013  . HERNIA REPAIR Right    age  85  . LUMBAR LAMINECTOMY/DECOMPRESSION MICRODISCECTOMY N/A 11/09/2013   Procedure: Lumbar Laminectomy/Decompression, Microdiscectomy Lumbar Three-Four, Four-Five, with Coflex;  Surgeon: Faythe Ghee, MD;  Location: [facility];  Service: Neurosurgery;  Laterality: N/A;  Lumbar Laminectomy/Decompression, Microdiscectomy Lumbar Three-Four, Four-Five, with Coflex  . PAROTIDECTOMY Left 11/17/2015   Procedure: LEFT PAROTIDECTOMY;  Surgeon: Izora Gala, MD;  Location: [place];  Service: ENT;  Laterality: Left;  . PROSTATE SURGERY  2013  . TONSILLECTOMY    . trigger thumb  1980   right    There  were no vitals filed for this visit.   Subjective Assessment - 10/05/19 1006    Subjective  Pt is a prior Pt for lumbar and is referred for Lt knee pain x 2 months.  Had fluid removed on 09/17/19 which helped pain.  Feels weak in Lt knee especially with getting out of chair and with walking    Pertinent History  cares for depedent wife who can't walk    Limitations  Walking;Other (comment)   walking   How long can you walk comfortably?  1/4 block due to back pain vs knee    Currently in Pain?  No/denies         [facility] PT Assessment - 10/05/19 0001      Assessment   Medical Diagnosis  M17.12 (ICD-10-CM) - Primary osteoarthritis of left knee    Referring Provider (PT)  Silverio Decamp, MD    Next MD Visit  --   6 weeks   Prior Therapy  for lumbar at this facility      Precautions   Precautions  None    Precaution Comments  has low back pain      Restrictions   Weight Bearing Restrictions  No      Balance Screen   Has the patient fallen in the past 6 months  No      Home Environment   Additional Comments  first floor apartment  Prior Function   Level of Independence  Independent    Vocation  Retired    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, 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 [employer]   Overall Cognitive Status  Within Functional Limits for tasks assessed      Observation/Other Assessments   Focus on Therapeutic Outcomes (FOTO)   41   33     Posture/Postural Control   Posture/Postural Control  Postural limitations    Postural Limitations  Flexed trunk;Forward head;Rounded Shoulders      ROM / Strength   AROM / PROM / Strength  AROM;Strength      AROM   Overall AROM Comments  Lt knee ext - 4, bil knee flexion 125, no pain with range in Lt knee      Strength   Overall Strength Comments  4/5 bil LEs throughout      Flexibility   Soft Tissue Assessment /Muscle Length  yes    Hamstrings  limited  25% bil      Palpation   Patella mobility  WNL    Palpation comment  no tenderness surrounding Lt knee      Transfers   Transfers  Sit to Stand;Stand to Sit    Sit to Stand  7: Independent    Comments  stands without UE support, slower on eccentric return but symmetrical      Ambulation/Gait   Assistive device  None    Gait Pattern  Step-through pattern;Decreased stride length    Gait Comments  flexed trunk                Objective measurements completed on examination: See above findings.              PT Education - 10/05/19 1047    Education Details  Access Code: [email or web address]    Person(s) Educated  Patient    Methods  Explanation;Demonstration;Handout    Comprehension  Verbalized understanding;Returned demonstration          PT Long Term Goals - 10/05/19 1059      PT LONG TERM GOAL #1   Title  Pt will be ind in advanced comprehensive HEP for LE strength with understanding of how to protect low back with all ther ex.    Time  6    Period  Weeks    Status  New    Target Date  11/16/19      PT LONG TERM GOAL #2   Title  Pt will achieve full Lt knee extension for improved gait    Time  6    Period  Weeks    Status  New    Target Date  11/16/19      PT LONG TERM GOAL #3   Title  Pt will return to taking several short walks daily to improve endurance.    Time  6    Period  Weeks    Status  New    Target Date  11/16/19      PT LONG TERM GOAL #4   Title  Pt will achieve at least 4+/5 strength in LEs to improve transfers and ability to better care for his wife.    Time  6    Period  Weeks    Status  New    Target Date  11/16/19      PT LONG TERM GOAL #5   Title  Reduce FOTO to </= 33% to demo less limitation    Baseline  41    Time  6    Period  Weeks    Status  New    Target Date  11/16/19             Plan - 10/05/19 1049    Clinical Impression Statement  Pt is a pleasant man with onset of Lt knee pain approx 2 months ago.  Knee  was recently aspirated and pain has resolved.  Pt has goal of improved knee strength to help with sit to stand, curbs and walking endurance.  Knee range lacks end extension but Pt has full flexion.  General strength is 4/5 throughout bil LEs.  Transfers and step ups/downs are ind but slow during eccentric phases.  PT built HEP and explained how to progress.  PT encouraged Pt to take short frequent walks to build endurance as tol.  Pt has history of LBP which is ongoing and limits walking more than Lt knee does.  Pt interested in just a few visits to build HEP.  He will benefit from skilled progression of LE strength, endurance and functional training to improve overall health and tolerance of household and community activity.    Personal Factors and Comorbidities  Age;Comorbidity 1    Comorbidities  ongoing LBP    Examination-Activity Limitations  Locomotion Level;Transfers    Examination-Participation Restrictions  Community Activity    Stability/Clinical Decision Making  Stable/Uncomplicated    Clinical Decision Making  Low    Rehab Potential  Excellent    PT Frequency  1x / week    PT Duration  6 weeks   Pt would like to attend 2-4 sessions total   PT Treatment/Interventions  ADLs/Self Care Home Management;Cryotherapy;Electrical Stimulation;Moist Heat;Gait training;Stair training;Functional mobility training;Therapeutic exercise;Therapeutic activities;Neuromuscular re-education;Patient/family education;Manual techniques;Passive range of motion;Joint Manipulations    PT Next Visit Plan  NuStep, f/u on HEP, step ups/downs, sit to stand watching for symmetrical use of Lt LE    PT Home Exercise Plan  Access Code: [email or web address]    Consulted and Agree with Plan of Care  Patient       Patient will benefit from skilled therapeutic intervention in order to improve the following deficits and impairments:  Abnormal gait, Decreased range of motion, Decreased endurance, Decreased activity tolerance, Impaired  flexibility, Improper body mechanics, Postural dysfunction, Decreased strength, Decreased mobility  Visit Diagnosis: Chronic pain of left knee - Plan: PT plan of care cert/re-cert  Muscle weakness (generalized) - Plan: PT plan of care cert/re-cert     Problem List Patient Active Problem List   Diagnosis Date Noted  . Primary osteoarthritis of left knee 09/17/2019  . Hyperlipidemia 09/27/2018  . Carpal tunnel syndrome on left 05/31/2016  . 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 Parotid mass 11/17/2015  . Carpal tunnel syndrome, right 08/18/2015  . Unspecified hereditary and idiopathic peripheral neuropathy 06/18/2014  . Lumbar spinal stenosis 11/09/2013  . ESOPHAGEAL REFLUX 04/10/2010  . HYPERTENSION, PULMONARY 04/07/2010  . UNSPECIFIED HEARING LOSS 08/07/2008  . HYPERTENSION, BENIGN 08/07/2008  . BPH (benign prostatic hyperplasia) 08/07/2008  . SCIATICA 08/07/2008    Baruch Merl, PT 10/05/19 11:05 AM   [facility] [address] [place], [place], [address] Phone: [phone]   Fax:  [phone]  Name: NACARI POINT MRN: [id number] Date of Birth: 05-14-1934

## 2019-10-22 ENCOUNTER — Other Ambulatory Visit: Payer: Self-pay

## 2019-10-22 ENCOUNTER — Encounter: Payer: Self-pay | Admitting: Physical Therapy

## 2019-10-22 ENCOUNTER — Ambulatory Visit: Payer: Medicare HMO | Attending: Sports Medicine | Admitting: Physical Therapy

## 2019-10-22 DIAGNOSIS — G8929 Other chronic pain: Secondary | ICD-10-CM

## 2019-10-22 DIAGNOSIS — M25562 Pain in left knee: Secondary | ICD-10-CM | POA: Diagnosis present

## 2019-10-22 DIAGNOSIS — M545 Low back pain, unspecified: Secondary | ICD-10-CM

## 2019-10-22 DIAGNOSIS — M6281 Muscle weakness (generalized): Secondary | ICD-10-CM | POA: Diagnosis present

## 2019-10-22 NOTE — Therapy (Signed)
[facility] [address] [place], [place], [address] Phone: [phone]   Fax:  [phone]  Physical Therapy Treatment  Patient Details  Name: Glenn Ortega MRN: [id number] Date of Birth: [birth year] Referring Provider (PT): Silverio Decamp, MD   Encounter Date: 10/22/2019  PT End of Session - 10/22/19 1021    Visit Number  2    Date for PT Re-Evaluation  11/16/19    Authorization Type  [employer] Medicare    PT Start Time  1017    PT Stop Time  1052   short session   PT Time Calculation (min)  35 min    Activity Tolerance  Patient tolerated treatment well;No increased pain    Behavior During Therapy  WFL for tasks assessed/performed       Past Medical History:  Diagnosis Date  . GERD (gastroesophageal reflux disease)   . Hyperlipidemia   . Hypertension    EKG and chest x ray 8/12 EPIC/states had stress test 2 yrs ago- doesnt remember where- not in EPIC  . Spinal stenosis    lumbar    Past Surgical History:  Procedure Laterality Date  . COLONOSCOPY    . CYSTOSCOPY  12/30/2011   Procedure: CYSTOSCOPY;  Surgeon: Reece Packer, MD;  Location: [facility];  Service: Urology;  Laterality: N/A;  . EYE SURGERY Bilateral 06/18/2017   cataract sx with lens implant  . HERNIA REPAIR  2013  . HERNIA REPAIR Right    age  86  . LUMBAR LAMINECTOMY/DECOMPRESSION MICRODISCECTOMY N/A 11/09/2013   Procedure: Lumbar Laminectomy/Decompression, Microdiscectomy Lumbar Three-Four, Four-Five, with Coflex;  Surgeon: Faythe Ghee, MD;  Location: [facility];  Service: Neurosurgery;  Laterality: N/A;  Lumbar Laminectomy/Decompression, Microdiscectomy Lumbar Three-Four, Four-Five, with Coflex  . PAROTIDECTOMY Left 11/17/2015   Procedure: LEFT PAROTIDECTOMY;  Surgeon: Izora Gala, MD;  Location: [place];  Service: ENT;  Laterality: Left;  . PROSTATE SURGERY  2013  . TONSILLECTOMY    . trigger thumb  1980   right     There were no vitals filed for this visit.  Subjective Assessment - 10/22/19 1019    Subjective  I'm doing good.  I am working up my walking distance.  Doing the HEP 2x/day.    Pertinent History  cares for depedent wife who can't walk    Limitations  Walking;Other (comment)    How long can you walk comfortably?  > 1/4 block with less back pain    Currently in Pain?  No/denies         [facility] PT Assessment - 10/22/19 0001      Assessment   Medical Diagnosis  M17.12 (ICD-10-CM) - Primary osteoarthritis of left knee    Referring Provider (PT)  Silverio Decamp, MD    Next MD Visit  --   6 weeks   Prior Therapy  for lumbar at this facility                   [facility] Adult PT Treatment/Exercise - 10/22/19 0001      Exercises   Exercises  Knee/Hip      Knee/Hip Exercises: Stretches   Other Knee/Hip Stretches  feet on red ball bil knee flexion/ext x 15      Knee/Hip Exercises: Aerobic   Nustep  10' L2 PT present to do FOTO and goal review      Knee/Hip Exercises: Standing   Hip Flexion Limitations  march toe  taps x 20 6" step Lt UE support on rail    Lateral Step Up  Hand Hold: 1;Both;2 sets;5 reps;Step Height: 6"    Forward Step Up  Hand Hold: 1;Both;2 sets;5 reps;Step Height: 6"    Forward Step Up Limitations  PT cued shift weight over leg before step up to avoid momentum      Knee/Hip Exercises: Seated   Long Arc Quad  Both;2 sets;15 reps    [employer] Limitations  yellow band around ankles for 2nd set (progress for HEP)    Clamshell with [employer]   15x2   Marching  Strengthening;15 reps;2 sets    Marching Limitations  red band    Sit to [employer]  3 sets;5 reps      Knee/Hip Exercises: Supine   Straight Leg Raises  Strengthening;Both;2 sets;10 reps;5 reps    Straight Leg Raises Limitations  10 reps first set, 5 reps second set                  PT Long Term Goals - 10/22/19 1024      PT LONG TERM GOAL #1   Title  Pt will be ind in  advanced comprehensive HEP for LE strength with understanding of how to protect low back with all ther ex.    Status  Achieved      PT LONG TERM GOAL #2   Title  Pt will achieve full Lt knee extension for improved gait    Status  Achieved      PT LONG TERM GOAL #3   Title  Pt will return to taking several short walks daily to improve endurance.    Status  Achieved      PT LONG TERM GOAL #4   Title  Pt will achieve at least 4+/5 strength in LEs to improve transfers and ability to better care for his wife.    Baseline  4-5+/5 bil LEs, this will continue to progress with ongoing compliance    Status  Partially Met      PT LONG TERM GOAL #5   Title  Reduce FOTO to </= 33% to demo less limitation    Baseline  36%    Status  Partially Met            Plan - 10/22/19 1021    Clinical Impression Statement  Pt has increased his walking distance with less back pain (initial barrier to walking distance) and continues to be painfree in Lt knee.  He is compliant with his HEP 2x/day.  FOTO score has reduced to 36% (goal 33%) nearly meeting goal in 2 visits.  He would like to be d/c'd to HEP with option to return if he has any further set back.    Stability/Clinical Decision Making  Stable/Uncomplicated    Rehab Potential  Excellent    PT Frequency  1x / week    PT Duration  6 weeks    PT Treatment/Interventions  ADLs/Self Care Home Management;Cryotherapy;Electrical Stimulation;Moist Heat;Gait training;Stair training;Functional mobility training;Therapeutic exercise;Therapeutic activities;Neuromuscular re-education;Patient/family education;Manual techniques;Passive range of motion;Joint Manipulations    PT Next Visit Plan  d/c to HEP with f/u as needed    PT Home Exercise Plan  Access Code: [email or web address]    Consulted and Agree with Plan of Care  Patient       Patient will benefit from skilled therapeutic intervention in order to improve the following deficits and impairments:  Abnormal gait,  Decreased range of motion, Decreased  endurance, Decreased activity tolerance, Impaired flexibility, Improper body mechanics, Postural dysfunction, Decreased strength, Decreased mobility  Visit Diagnosis: Chronic pain of left knee  Muscle weakness (generalized)  Chronic bilateral low back pain without sciatica     Problem List Patient Active Problem List   Diagnosis Date Noted  . Primary osteoarthritis of left knee 09/17/2019  . Hyperlipidemia 09/27/2018  . Carpal tunnel syndrome on left 05/31/2016  . 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 Parotid mass 11/17/2015  . Carpal tunnel syndrome, right 08/18/2015  . Unspecified hereditary and idiopathic peripheral neuropathy 06/18/2014  . Lumbar spinal stenosis 11/09/2013  . ESOPHAGEAL REFLUX 04/10/2010  . HYPERTENSION, PULMONARY 04/07/2010  . UNSPECIFIED HEARING LOSS 08/07/2008  . HYPERTENSION, BENIGN 08/07/2008  . BPH (benign prostatic hyperplasia) 08/07/2008  . SCIATICA 08/07/2008    PHYSICAL THERAPY DISCHARGE SUMMARY  Visits from Start of Care: 2  Current functional level related to goals / functional outcomes: See above   Remaining deficits: See above  Education / Equipment: HEP Plan: Patient agrees to discharge.  Patient goals were partially met. Patient is being discharged due to being pleased with the current functional level.  ?????         Baruch Merl, PT 10/22/19 10:54 AM   [facility] [address], [place], [place], [address] Phone: [phone]   Fax:  [phone]  Name: SADE MEHLHOFF MRN: [id number] Date of Birth: 12-01-1933

## 2019-10-24 DIAGNOSIS — I739 Peripheral vascular disease, unspecified: Secondary | ICD-10-CM | POA: Diagnosis not present

## 2019-10-24 DIAGNOSIS — B351 Tinea unguium: Secondary | ICD-10-CM | POA: Diagnosis not present

## 2019-10-29 ENCOUNTER — Other Ambulatory Visit: Payer: Self-pay | Admitting: Adult Health

## 2019-10-29 DIAGNOSIS — L570 Actinic keratosis: Secondary | ICD-10-CM | POA: Diagnosis not present

## 2019-10-29 DIAGNOSIS — D2261 Melanocytic nevi of right upper limb, including shoulder: Secondary | ICD-10-CM | POA: Diagnosis not present

## 2019-10-29 DIAGNOSIS — D2262 Melanocytic nevi of left upper limb, including shoulder: Secondary | ICD-10-CM | POA: Diagnosis not present

## 2019-10-29 DIAGNOSIS — E785 Hyperlipidemia, unspecified: Secondary | ICD-10-CM

## 2019-10-29 DIAGNOSIS — D225 Melanocytic nevi of trunk: Secondary | ICD-10-CM | POA: Diagnosis not present

## 2019-10-29 DIAGNOSIS — D1801 Hemangioma of skin and subcutaneous tissue: Secondary | ICD-10-CM | POA: Diagnosis not present

## 2019-10-29 DIAGNOSIS — D692 Other nonthrombocytopenic purpura: Secondary | ICD-10-CM | POA: Diagnosis not present

## 2019-10-29 DIAGNOSIS — L814 Other melanin hyperpigmentation: Secondary | ICD-10-CM | POA: Diagnosis not present

## 2019-10-29 DIAGNOSIS — D2272 Melanocytic nevi of left lower limb, including hip: Secondary | ICD-10-CM | POA: Diagnosis not present

## 2019-10-29 DIAGNOSIS — L821 Other seborrheic keratosis: Secondary | ICD-10-CM | POA: Diagnosis not present

## 2019-10-30 NOTE — Telephone Encounter (Signed)
Patient need to schedule an ov for more refills. 

## 2019-10-31 NOTE — Telephone Encounter (Signed)
Pt has been scheduled for next week. Rx sent in for 30 days.

## 2019-11-08 ENCOUNTER — Ambulatory Visit (INDEPENDENT_AMBULATORY_CARE_PROVIDER_SITE_OTHER): Payer: Medicare HMO | Admitting: Adult Health

## 2019-11-08 ENCOUNTER — Encounter: Payer: Self-pay | Admitting: Adult Health

## 2019-11-08 ENCOUNTER — Other Ambulatory Visit: Payer: Self-pay

## 2019-11-08 VITALS — BP 128/78 | HR 84 | Temp 97.3°F | Wt 228.4 lb

## 2019-11-08 DIAGNOSIS — E039 Hypothyroidism, unspecified: Secondary | ICD-10-CM

## 2019-11-08 LAB — TSH: TSH: 2.62 u[IU]/mL (ref 0.35–4.50)

## 2019-11-08 NOTE — Progress Notes (Signed)
Subjective:    Patient ID: Glenn Ortega, male    DOB: 06-11-1934, 86 y.o.   MRN: [id number]  HPI 86 year old male who  has a past medical history of GERD (gastroesophageal reflux disease), Hyperlipidemia, Hypertension, and Spinal stenosis.  He presents to the office today for TSH check. He was placed on Synthroid 50 in December 2019 for symptoms including fatigue and falling asleep easily. At this time his TSH was 5.56 . He was placed on Synthroid 50 mcg and his thyroid level came back down to normal.  He is currently maintained on Synthroid 50 mcg in  He reports feeling good on this dose. Has no complaints of hypothyroidism   Review of Systems See HPI   Past Medical History:  Diagnosis Date  . GERD (gastroesophageal reflux disease)   . Hyperlipidemia   . Hypertension    EKG and chest x ray 8/12 EPIC/states had stress test 2 yrs ago- doesnt remember where- not in EPIC  . Spinal stenosis    lumbar    Social History   Socioeconomic History  . Marital status: Married    Spouse name: [name]  . Number of children: Not on file  . Years of education: Not on file  . Highest education level: Not on file  Occupational History  . Occupation: Estate agent: [employer]  Tobacco Use  . Smoking status: Never Smoker  . Smokeless tobacco: Never Used  . Tobacco comment: per patient he never smoke. 11/25/2014 /rc  Substance and Sexual Activity  . Alcohol use: Yes    Alcohol/week: 2.0 standard drinks    Types: 2 Standard drinks or equivalent per week    Comment: socially- occ beer  . Drug use: No  . Sexual activity: Not Currently  Other Topics Concern  . Not on file  Social History Narrative   Works 3 nights a week at [employer].    Previously worked as a Freight forwarder for [employer].   Married to [place]. 2 caffeinated drinks per day. Does not exercise he says secondary to back problems.  Lives in a one story home.     Has 2 children.  Education: some college. He is an  is in [facility] from [place] Strain:   . Difficulty of Paying Living Expenses: Not on file  Food Insecurity:   . Worried About Charity fundraiser in the Last Year: Not on file  . Ran Out of Food in the Last Year: Not on file  Transportation Needs:   . Lack of Transportation (Medical): Not on file  . Lack of Transportation (Non-Medical): Not on file  Physical Activity:   . Days of Exercise per Week: Not on file  . Minutes of Exercise per Session: Not on file  Stress:   . Feeling of Stress : Not on file  Social Connections:   . Frequency of Communication with Friends and Family: Not on file  . Frequency of Social Gatherings with Friends and Family: Not on file  . Attends Religious Services: Not on file  . Active Member of Clubs or Organizations: Not on file  . Attends Archivist Meetings: Not on file  . Marital Status: Not on file  Intimate Partner Violence:   . Fear of Current or Ex-Partner: Not on file  . Emotionally Abused: Not on file  . Physically Abused: Not on file  .  Sexually Abused: Not on file    Past Surgical History:  Procedure Laterality Date  . COLONOSCOPY    . CYSTOSCOPY  12/30/2011   Procedure: CYSTOSCOPY;  Surgeon: Reece Packer, MD;  Location: [facility];  Service: Urology;  Laterality: N/A;  . EYE SURGERY Bilateral 06/18/2017   cataract sx with lens implant  . HERNIA REPAIR  2013  . HERNIA REPAIR Right    age  45  . LUMBAR LAMINECTOMY/DECOMPRESSION MICRODISCECTOMY N/A 11/09/2013   Procedure: Lumbar Laminectomy/Decompression, Microdiscectomy Lumbar Three-Four, Four-Five, with Coflex;  Surgeon: Faythe Ghee, MD;  Location: [facility];  Service: Neurosurgery;  Laterality: N/A;  Lumbar Laminectomy/Decompression, Microdiscectomy Lumbar Three-Four, Four-Five, with Coflex  . PAROTIDECTOMY Left 11/17/2015   Procedure: LEFT PAROTIDECTOMY;  Surgeon: Izora Gala, MD;  Location: [place];  Service: ENT;  Laterality: Left;  . PROSTATE SURGERY  2013  . TONSILLECTOMY    . trigger thumb  1980   right    Family History  Problem Relation Age of Onset  . Hypertension Unknown   . Cancer Mother        bone?  . Myasthenia gravis Father   . Heart disease Brother     Allergies  Allergen Reactions  . Penicillins Swelling    CHILDHOOD ALLERGY Has patient had a PCN reaction causing immediate rash, facial/tongue/throat swelling, SOB or lightheadedness with hypotension: Yes Has patient had a PCN reaction causing severe rash involving mucus membranes or skin necrosis: No Has patient had a PCN reaction that required hospitalization: No Has patient had a PCN reaction occurring within the last 10 years: No If all of the above answers are "NO", then may proceed with Cephalosporin use.   . Gabapentin     "MADE ME FEEL CRAZY"    Current Outpatient Medications on File Prior to Visit  Medication Sig Dispense Refill  . atorvastatin (LIPITOR) 10 MG tablet TAKE ONE TABLET BY MOUTH DAILY 90 tablet 2  . fluticasone (FLONASE) 50 MCG/ACT nasal spray Place 2 sprays into both nostrils daily. 16 g 6  . levothyroxine (SYNTHROID) 50 MCG tablet TAKE ONE TABLET BY MOUTH DAILY 30 tablet 0  . losartan (COZAAR) 25 MG tablet TAKE ONE TABLET BY MOUTH DAILY 30 tablet 0  . Multiple Vitamins-Minerals (PRESERVISION AREDS 2 PO) Take by mouth.    . baclofen (LIORESAL) 10 MG tablet TAKE ONE TABLET BY MOUTH THREE TIMES A DAY AS NEEDED FOR MUSCLE SPASMS (Patient not taking: Reported on 11/08/2019) 30 tablet 0   No current facility-administered medications on file prior to visit.    BP 128/78 (BP Location: Left Arm, Patient Position: Sitting, Cuff Size: Large)   Pulse 84   Temp (!) 97.3 F (36.3 C) (Temporal)   Wt 228 lb 6.4 oz (103.6 kg)   SpO2 96%   BMI 37.15 kg/m       Objective:   Physical Exam Vitals and nursing note reviewed.  Constitutional:      Appearance: He is obese.    Cardiovascular:     Rate and Rhythm: Normal rate and regular rhythm.     Pulses: Normal pulses.     Heart sounds: Normal heart sounds.  Musculoskeletal:        General: Normal range of motion.     Cervical back: Normal range of motion and neck supple.  Skin:    General: Skin is warm and dry.     Capillary Refill: Capillary refill takes less than 2 seconds.  Neurological:  General: No focal deficit present.     Mental Status: He is alert and oriented to person, place, and time.  Psychiatric:        Mood and Affect: Mood normal.        Behavior: Behavior normal.        Thought Content: Thought content normal.        Judgment: Judgment normal.       Assessment & Plan:  1. Acquired hypothyroidism - Consider dose change  - TSH   [name], NP

## 2019-11-09 ENCOUNTER — Other Ambulatory Visit: Payer: Self-pay

## 2019-11-09 ENCOUNTER — Telehealth: Payer: Self-pay | Admitting: Adult Health

## 2019-11-09 MED ORDER — LEVOTHYROXINE SODIUM 50 MCG PO TABS: 50.0000 ug | ORAL_TABLET | Freq: Every day | ORAL | 3 refills | Status: DC

## 2019-11-09 NOTE — Telephone Encounter (Signed)
  The patient is returning your call from [place]. He stated that you were calling about some results. He can be reached at [phone]

## 2019-11-11 ENCOUNTER — Ambulatory Visit: Payer: Medicare HMO | Attending: Internal Medicine

## 2019-11-11 DIAGNOSIS — Z23 Encounter for immunization: Secondary | ICD-10-CM | POA: Insufficient documentation

## 2019-11-11 NOTE — Progress Notes (Signed)
   Covid-19 Vaccination Clinic  Name:  Glenn Ortega    MRN: [id number] DOB: 1933/12/12  11/11/2019  Glenn Ortega was observed post Covid-19 immunization for 15 minutes without incidence. He was provided with Vaccine Information Sheet and instruction to access the V-Safe system.   Glenn Ortega was instructed to call 911 with any severe reactions post vaccine: Marland Kitchen Difficulty breathing  . Swelling of your face and throat  . A fast heartbeat  . A bad rash all over your body  . Dizziness and weakness    Immunizations Administered    Name Date Dose VIS Date Route   Pfizer COVID-19 Vaccine 11/11/2019 11:49 AM 0.3 mL 09/28/2019 Intramuscular   Manufacturer: [place]   Lot: [id number]   [place]: [id number]

## 2019-11-23 ENCOUNTER — Other Ambulatory Visit: Payer: Self-pay

## 2019-11-23 MED ORDER — LOSARTAN POTASSIUM 25 MG PO TABS: 25.0000 mg | ORAL_TABLET | Freq: Every day | ORAL | 0 refills | Status: DC

## 2019-12-03 ENCOUNTER — Ambulatory Visit: Payer: Medicare HMO | Attending: Internal Medicine

## 2019-12-03 DIAGNOSIS — Z23 Encounter for immunization: Secondary | ICD-10-CM | POA: Insufficient documentation

## 2019-12-03 NOTE — Progress Notes (Signed)
   Covid-19 Vaccination Clinic  Name:  DAKEN RAMEY    MRN: [id number] DOB: 12-03-1933  12/03/2019  Mr. Bolich was observed post Covid-19 immunization for 15 minutes without incidence. He was provided with Vaccine Information Sheet and instruction to access the V-Safe system.   Mr. Morie was instructed to call 911 with any severe reactions post vaccine: Marland Kitchen Difficulty breathing  . Swelling of your face and throat  . A fast heartbeat  . A bad rash all over your body  . Dizziness and weakness    Immunizations Administered    Name Date Dose VIS Date Route   Pfizer COVID-19 Vaccine 12/03/2019 10:40 AM 0.3 mL 09/28/2019 Intramuscular   Manufacturer: [place]   Lot: [id number]   [place]: [id number]

## 2019-12-06 ENCOUNTER — Other Ambulatory Visit: Payer: Self-pay | Admitting: Adult Health

## 2019-12-18 ENCOUNTER — Telehealth: Payer: Self-pay | Admitting: Adult Health

## 2019-12-18 NOTE — Telephone Encounter (Signed)
error 

## 2020-01-08 ENCOUNTER — Telehealth: Payer: Self-pay | Admitting: Adult Health

## 2020-01-11 ENCOUNTER — Telehealth: Payer: Self-pay | Admitting: Adult Health

## 2020-01-11 NOTE — Telephone Encounter (Signed)
Pt wanted to know if Glenn Ortega has had a chance to contact nursing and rehab for him. He would like a call back.

## 2020-01-15 NOTE — Telephone Encounter (Signed)
Nursing and rehab is for his wife.  Will close this message.

## 2020-01-30 ENCOUNTER — Telehealth (INDEPENDENT_AMBULATORY_CARE_PROVIDER_SITE_OTHER): Payer: Medicare HMO | Admitting: Adult Health

## 2020-01-30 ENCOUNTER — Telehealth: Payer: Self-pay | Admitting: Adult Health

## 2020-01-30 ENCOUNTER — Encounter: Payer: Self-pay | Admitting: Adult Health

## 2020-01-30 ENCOUNTER — Other Ambulatory Visit: Payer: Self-pay

## 2020-01-30 DIAGNOSIS — R05 Cough: Secondary | ICD-10-CM | POA: Diagnosis not present

## 2020-01-30 DIAGNOSIS — R059 Cough, unspecified: Secondary | ICD-10-CM

## 2020-01-30 MED ORDER — DOXYCYCLINE HYCLATE 100 MG PO CAPS: 100.0000 mg | ORAL_CAPSULE | Freq: Two times a day (BID) | ORAL | 0 refills | Status: DC

## 2020-01-30 MED ORDER — PREDNISONE 10 MG PO TABS: 10.0000 mg | ORAL_TABLET | Freq: Every day | ORAL | 0 refills | Status: DC

## 2020-01-30 NOTE — Progress Notes (Signed)
Virtual Visit via Telephone Note  I connected with Glenn Ortega on 01/30/20 at  3:00 PM EDT by telephone and verified that I am speaking with the correct person using two identifiers.   I discussed the limitations, risks, security and privacy concerns of performing an evaluation and management service by telephone and the availability of in person appointments. I also discussed with the patient that there may be a patient responsible charge related to this service. The patient expressed understanding and agreed to proceed.  Location patient: home Location provider: work or home office Participants present for the call: patient, provider Patient did not have a visit in the prior 7 days to address this/these issue(s).   History of Present Illness: 86 year old male who  has a past medical history of GERD (gastroesophageal reflux disease), Hyperlipidemia, Hypertension, and Spinal stenosis.  He is being seen today for an acute issue of cough that started 3 to 4 days ago.  He does report questionable fevers but no shortness of breath or wheezing. For the most part the cough is dry but he will get some discolored phlegm up at times.  He denies shortness of breath but states "I can feel some rattling going on the left side, I have some pain on my left ribs."  Cough is present throughout the day but is worse at night and it is keeping him from sleeping.  Denies sinus pain or pressure. Other associated symptoms include fatigue and feeling acutely ill. He has been using OTC cough medication without much improvement.    Observations/Objective: Patient sounds cheerful and well on the phone. I do not appreciate any SOB. Speech and thought processing are grossly intact. Patient reported vitals:  Assessment and Plan: 1. Cough -Cannot rule out possible pneumonia due to questionable fevers, semiproductive cough, fatigue, and left sided pain.  Treat with doxycycline and prednisone.  He can continue to use  over-the-counter cough medication.  Follow-up if no improvement or if symptoms worsen. - doxycycline (VIBRAMYCIN) 100 MG capsule; Take 1 capsule (100 mg total) by mouth 2 (two) times daily.  Dispense: 20 capsule; Refill: 0 - predniSONE (DELTASONE) 10 MG tablet; Take 1 tablet (10 mg total) by mouth daily with breakfast.  Dispense: 7 tablet; Refill: 0   Follow Up Instructions:  I did not refer this patient for an OV in the next 24 hours for this/these issue(s).  I discussed the assessment and treatment plan with the patient. The patient was provided an opportunity to ask questions and all were answered. The patient agreed with the plan and demonstrated an understanding of the instructions.   The patient was advised to call back or seek an in-person evaluation if the symptoms worsen or if the condition fails to improve as anticipated.  I provided 20 minutes of non-face-to-face time during this encounter.   [name], NP

## 2020-01-30 NOTE — Telephone Encounter (Signed)
Error-pt sch appt instead

## 2020-02-07 ENCOUNTER — Encounter: Payer: Self-pay | Admitting: Sports Medicine

## 2020-02-07 ENCOUNTER — Other Ambulatory Visit: Payer: Self-pay

## 2020-02-07 ENCOUNTER — Ambulatory Visit (INDEPENDENT_AMBULATORY_CARE_PROVIDER_SITE_OTHER): Payer: Medicare HMO | Admitting: Sports Medicine

## 2020-02-07 DIAGNOSIS — G5603 Carpal tunnel syndrome, bilateral upper limbs: Secondary | ICD-10-CM

## 2020-02-07 NOTE — Assessment & Plan Note (Signed)
This pleasant 86 year old male returns, we last did a median nerve hydrodissection on the right in November of last year, he is now having recurrence of symptoms bilaterally, with paresthesias into the fingertips and dropping objects. Today we performed a repeat bilateral median nerve Hydro dissection, return to see me as needed.  Of note his wife is in the hospital with hepatic encephalopathy, portal hypertension and GI bleeding.

## 2020-02-07 NOTE — Progress Notes (Signed)
    Procedures performed today:    Procedure: Real-time Ultrasound Guided hydrodissection of the left median nerve at the carpal tunnel Device: Samsung HS60 Verbal informed consent obtained.  Time-out conducted.  Noted no overlying erythema, induration, or other signs of local infection.  Skin prepped in a sterile fashion.  Local anesthesia: Topical Ethyl chloride.  With sterile technique and under real time ultrasound guidance: Using a 25-gauge needle advanced into the carpal tunnel, taking care to avoid intraneural injection I injected medication both superficial to and deep to the median nerve freeing it from surrounding structures, I then redirected the needle deep and injected further medication around the flexor tendons deep within the carpal tunnel for a total of 1 cc kenalog 40, 5 cc 1% lidocaine without epinephrine. Completed without difficulty  Advised to call if fevers/chills, erythema, induration, drainage, or persistent bleeding.  Images permanently stored and available for review in the ultrasound unit.  Impression: Technically successful ultrasound guided median nerve hydrodissection.  Procedure: Real-time Ultrasound Guided hydrodissection of the right median nerve at the carpal tunnel Device: Samsung HS60 Verbal informed consent obtained.  Time-out conducted.  Noted no overlying erythema, induration, or other signs of local infection.  Skin prepped in a sterile fashion.  Local anesthesia: Topical Ethyl chloride.  With sterile technique and under real time ultrasound guidance: Using a 25-gauge needle advanced into the carpal tunnel, taking care to avoid intraneural injection I injected medication both superficial to and deep to the median nerve freeing it from surrounding structures, I then redirected the needle deep and injected further medication around the flexor tendons deep within the carpal tunnel for a total of 1 cc kenalog 40, 5 cc 1% lidocaine without  epinephrine. Completed without difficulty  Advised to call if fevers/chills, erythema, induration, drainage, or persistent bleeding.  Images permanently stored and available for review in the ultrasound unit.  Impression: Technically successful ultrasound guided median nerve hydrodissection.  Independent interpretation of notes and tests performed by another provider:   None.  Brief History, Exam, Impression, and Recommendations:    Carpal tunnel syndrome, bilateral This pleasant 86 year old male returns, we last did a median nerve hydrodissection on the right in November of last year, he is now having recurrence of symptoms bilaterally, with paresthesias into the fingertips and dropping objects. Today we performed a repeat bilateral median nerve Hydro dissection, return to see me as needed.  Of note his wife is in the hospital with hepatic encephalopathy, portal hypertension and GI bleeding.    ___________________________________________ Gwen Her. Dianah Field, M.D., ABFM., CAQSM. Primary Care and Section Instructor of [place] [facility]

## 2020-03-02 ENCOUNTER — Other Ambulatory Visit: Payer: Self-pay | Admitting: Adult Health

## 2020-03-05 NOTE — Telephone Encounter (Signed)
FILLED FOR 30 DAYS.  PT IS PAST DUE FOR CPX.

## 2020-03-11 ENCOUNTER — Encounter: Payer: Self-pay | Admitting: Sports Medicine

## 2020-03-11 ENCOUNTER — Ambulatory Visit (INDEPENDENT_AMBULATORY_CARE_PROVIDER_SITE_OTHER): Payer: Medicare HMO | Admitting: Sports Medicine

## 2020-03-11 ENCOUNTER — Other Ambulatory Visit: Payer: Self-pay

## 2020-03-11 DIAGNOSIS — G5603 Carpal tunnel syndrome, bilateral upper limbs: Secondary | ICD-10-CM

## 2020-03-11 NOTE — Progress Notes (Signed)
    Procedures performed today:    None.  Independent interpretation of notes and tests performed by another provider:   None.  Brief History, Exam, Impression, and Recommendations:    Carpal tunnel syndrome, bilateral This is a pleasant 86 year old male, we treated him for bilateral carpal tunnel syndrome at the last visit, I did bilateral median nerve Hydro dissections, symptoms have resolved on the left side, his pain has resolved on the right but he still has numbness and is still dropping things on the right side. At this point I think it is reasonable to proceed with a hand surgery consultation.  Of note his wife who was in the hospital for hepatic encephalopathy, portal hypertension and GI bleeding has been discharged to a rehab facility, encephalopathy has resolved, bleeding has resolved as well.  He should be taking her home tomorrow.    ___________________________________________ Gwen Her. Dianah Field, M.D., ABFM., CAQSM. Primary Care and [place] Instructor of [employer] [employer] [facility] Medicine

## 2020-03-11 NOTE — Assessment & Plan Note (Signed)
This is a pleasant 86 year old male, we treated him for bilateral carpal tunnel syndrome at the last visit, I did bilateral median nerve Hydro dissections, symptoms have resolved on the left side, his pain has resolved on the right but he still has numbness and is still dropping things on the right side. At this point I think it is reasonable to proceed with a hand surgery consultation.  Of note his wife who was in the hospital for hepatic encephalopathy, portal hypertension and GI bleeding has been discharged to a rehab facility, encephalopathy has resolved, bleeding has resolved as well.  He should be taking her home tomorrow.

## 2020-04-11 DIAGNOSIS — I739 Peripheral vascular disease, unspecified: Secondary | ICD-10-CM | POA: Diagnosis not present

## 2020-04-11 DIAGNOSIS — B351 Tinea unguium: Secondary | ICD-10-CM | POA: Diagnosis not present

## 2020-04-11 DIAGNOSIS — M6281 Muscle weakness (generalized): Secondary | ICD-10-CM | POA: Diagnosis not present

## 2020-04-24 DIAGNOSIS — I70219 Atherosclerosis of native arteries of extremities with intermittent claudication, unspecified extremity: Secondary | ICD-10-CM | POA: Diagnosis not present

## 2020-04-24 DIAGNOSIS — I739 Peripheral vascular disease, unspecified: Secondary | ICD-10-CM | POA: Diagnosis not present

## 2020-05-02 ENCOUNTER — Telehealth: Payer: Self-pay | Admitting: Adult Health

## 2020-05-02 ENCOUNTER — Encounter: Payer: Self-pay | Admitting: Adult Health

## 2020-05-02 ENCOUNTER — Ambulatory Visit (INDEPENDENT_AMBULATORY_CARE_PROVIDER_SITE_OTHER): Payer: Medicare HMO | Admitting: Adult Health

## 2020-05-02 ENCOUNTER — Other Ambulatory Visit: Payer: Self-pay

## 2020-05-02 VITALS — BP 126/84 | Temp 98.3°F | Wt 235.0 lb

## 2020-05-02 DIAGNOSIS — F39 Unspecified mood [affective] disorder: Secondary | ICD-10-CM

## 2020-05-02 DIAGNOSIS — R5383 Other fatigue: Secondary | ICD-10-CM | POA: Diagnosis not present

## 2020-05-02 MED ORDER — CITALOPRAM HYDROBROMIDE 10 MG PO TABS: 10.0000 mg | ORAL_TABLET | Freq: Every day | ORAL | 0 refills | Status: DC

## 2020-05-02 NOTE — Telephone Encounter (Signed)
error 

## 2020-05-02 NOTE — Progress Notes (Signed)
Subjective:    Patient ID: Glenn Ortega, male    DOB: 1934/10/07, 86 y.o.   MRN: [id number]  HPI 86 year old male who  has a past medical history of GERD (gastroesophageal reflux disease), Hyperlipidemia, Hypertension, and Spinal stenosis.   He presents to the office today for fatigue and mood disorder.  He is the main caregiver for his sickly wife and has been taking care of her for many years now but over the last year her health is further declined.  He is unable to get anybody into the home as he does not have the financial resources to do so and his insurance will not cover it because he makes too much money.  He has spoken to a Education officer, museum 1 times in the past and the only way that he could possibly get some help is for her to be admitted to the hospital and discharged to a skilled nerve nursing facility.  Unfortunately she refuses to be transferred anywhere about home after discharge.  He is having to do the all the household chores, manage the finances, and take care of her.  She refuses to get out of the bed and will not work on his health.  He is very frustrated with her and is constantly feeling fatigued.  He reports that he goes to bed around midnight and is up around 6 to continue to provide care.  He other day he was fixing a clock above one of the door frames and could not get the battery in so he ended up throwing the clock on the floor and frustration.  He understands that this frustration is not healthy and is wondering if anything could be done  Denies SI/HI    Review of Systems See HPI   Past Medical History:  Diagnosis Date  . GERD (gastroesophageal reflux disease)   . Hyperlipidemia   . Hypertension    EKG and chest x ray 8/12 [facility]/states had stress test 2 yrs ago- doesnt remember where- not in [facility]  . Spinal stenosis    lumbar    Social History   Socioeconomic History  . Marital status: Married    Spouse name: [name]  . Number of children: Not on file  .  Years of education: Not on file  . Highest education level: Not on file  Occupational History  . Occupation: Estate agent: [employer]  Tobacco Use  . Smoking status: Never Smoker  . Smokeless tobacco: Never Used  . Tobacco comment: per patient he never smoke. 11/25/2014 /rc  Vaping Use  . Vaping Use: Never used  Substance and Sexual Activity  . Alcohol use: Yes    Alcohol/week: 2.0 standard drinks    Types: 2 Standard drinks or equivalent per week    Comment: socially- occ beer  . Drug use: No  . Sexual activity: Not Currently  Other Topics Concern  . Not on file  Social History Narrative   Works 3 nights a week at [employer].    Previously worked as a Freight forwarder for [employer].   Married to [place]. 2 caffeinated drinks per day. Does not exercise he says secondary to back problems.  Lives in a one story home.     Has 2 children.  Education: some college. He is an is in [facility] from [place] Strain:   . Difficulty of Paying Living Expenses:  Food Insecurity:   . Worried About Charity fundraiser in the Last Year:   . Arboriculturist in the Last Year:   Transportation Needs:   . Film/video editor (Medical):   Marland Kitchen Lack of Transportation (Non-Medical):   Physical Activity:   . Days of Exercise per Week:   . Minutes of Exercise per Session:   Stress:   . Feeling of Stress :   Social Connections:   . Frequency of Communication with Friends and Family:   . Frequency of Social Gatherings with Friends and Family:   . Attends Religious Services:   . Active Member of Clubs or Organizations:   . Attends Archivist Meetings:   Marland Kitchen Marital Status:   Intimate Partner Violence:   . Fear of Current or Ex-Partner:   . Emotionally Abused:   Marland Kitchen Physically Abused:   . Sexually Abused:     Past Surgical History:  Procedure Laterality Date  . COLONOSCOPY    . CYSTOSCOPY  12/30/2011   Procedure:  CYSTOSCOPY;  Surgeon: Reece Packer, MD;  Location: [facility];  Service: Urology;  Laterality: N/A;  . EYE SURGERY Bilateral 06/18/2017   cataract sx with lens implant  . HERNIA REPAIR  2013  . HERNIA REPAIR Right    age  15  . LUMBAR LAMINECTOMY/DECOMPRESSION MICRODISCECTOMY N/A 11/09/2013   Procedure: Lumbar Laminectomy/Decompression, Microdiscectomy Lumbar Three-Four, Four-Five, with Coflex;  Surgeon: Faythe Ghee, MD;  Location: [facility];  Service: Neurosurgery;  Laterality: N/A;  Lumbar Laminectomy/Decompression, Microdiscectomy Lumbar Three-Four, Four-Five, with Coflex  . PAROTIDECTOMY Left 11/17/2015   Procedure: LEFT PAROTIDECTOMY;  Surgeon: Izora Gala, MD;  Location: [place];  Service: ENT;  Laterality: Left;  . PROSTATE SURGERY  2013  . TONSILLECTOMY    . trigger thumb  1980   right    Family History  Problem Relation Age of Onset  . Hypertension Unknown   . Cancer Mother        bone?  . Myasthenia gravis Father   . Heart disease Brother     Allergies  Allergen Reactions  . Penicillins Swelling    CHILDHOOD ALLERGY Has patient had a PCN reaction causing immediate rash, facial/tongue/throat swelling, SOB or lightheadedness with hypotension: Yes Has patient had a PCN reaction causing severe rash involving mucus membranes or skin necrosis: No Has patient had a PCN reaction that required hospitalization: No Has patient had a PCN reaction occurring within the last 10 years: No If all of the above answers are "NO", then may proceed with Cephalosporin use.   . Gabapentin     "MADE ME FEEL CRAZY"    Current Outpatient Medications on File Prior to Visit  Medication Sig Dispense Refill  . atorvastatin (LIPITOR) 10 MG tablet TAKE ONE TABLET BY MOUTH DAILY 90 tablet 2  . baclofen (LIORESAL) 10 MG tablet TAKE ONE TABLET BY MOUTH THREE TIMES A DAY AS NEEDED FOR MUSCLE SPASMS 30 tablet 0  . fluticasone (FLONASE) 50 MCG/ACT nasal spray Place 2 sprays into  both nostrils daily. 16 g 6  . levothyroxine (SYNTHROID) 50 MCG tablet TAKE ONE TABLET BY MOUTH DAILY 90 tablet 2  . losartan (COZAAR) 25 MG tablet Take 1 tablet (25 mg total) by mouth daily. **DUE FOR YEARLY PHYSICAL** 30 tablet 0  . Multiple Vitamins-Minerals (PRESERVISION AREDS 2 PO) Take by mouth.     No current facility-administered medications on file prior to visit.    BP 126/84  Temp 98.3 F (36.8 C)   Wt 235 lb (106.6 kg)   BMI 38.22 kg/m       Objective:   Physical Exam Vitals and nursing note reviewed.  Constitutional:      Appearance: Normal appearance.  Cardiovascular:     Rate and Rhythm: Normal rate and regular rhythm.     Pulses: Normal pulses.     Heart sounds: Normal heart sounds.  Pulmonary:     Effort: Pulmonary effort is normal.     Breath sounds: Normal breath sounds.  Musculoskeletal:        General: Normal range of motion.  Skin:    General: Skin is warm and dry.     Capillary Refill: Capillary refill takes less than 2 seconds.  Neurological:     General: No focal deficit present.     Mental Status: He is alert and oriented to person, place, and time.  Psychiatric:        Mood and Affect: Mood normal.        Behavior: Behavior normal.        Thought Content: Thought content normal.        Judgment: Judgment normal.       Assessment & Plan:  Teagan mood disorder due to wife's failing health and caregiver burden.  We will place him on Celexa 10 mg.  He will follow-up in 4 weeks or sooner if symptoms worsen.  Encouraged to get some rest and take care of himself, right now there is only so much he can do for his wife.  [name], NP

## 2020-05-02 NOTE — Patient Instructions (Signed)
It was great seeing you today, I am sorry this is all going on   I am going to prescribe you a medication called Celexa   Please follow up with me in 4 weeks

## 2020-05-05 ENCOUNTER — Other Ambulatory Visit: Payer: Self-pay | Admitting: Adult Health

## 2020-05-18 ENCOUNTER — Other Ambulatory Visit: Payer: Self-pay | Admitting: Adult Health

## 2020-05-20 NOTE — Telephone Encounter (Signed)
SENT TO THE PHARMACY ON 05/07/2020.

## 2020-06-03 ENCOUNTER — Other Ambulatory Visit: Payer: Self-pay

## 2020-06-03 ENCOUNTER — Ambulatory Visit (INDEPENDENT_AMBULATORY_CARE_PROVIDER_SITE_OTHER): Payer: Medicare HMO | Admitting: Adult Health

## 2020-06-03 ENCOUNTER — Encounter: Payer: Self-pay | Admitting: Adult Health

## 2020-06-03 VITALS — BP 154/104 | Temp 97.9°F | Wt 237.0 lb

## 2020-06-03 DIAGNOSIS — F39 Unspecified mood [affective] disorder: Secondary | ICD-10-CM

## 2020-06-03 NOTE — Progress Notes (Signed)
Subjective:    Patient ID: Glenn Ortega, male    DOB: 14-Jan-1934, 86 y.o.   MRN: [id number]  HPI  86 year old male who  has a past medical history of GERD (gastroesophageal reflux disease), Hyperlipidemia, Hypertension, and Spinal stenosis.   He presents to the office today for 1 month follow-up regarding depression and mood disorder.  During his last visit he was started on low-dose Celexa and reports that he stopped taking this a few weeks after starting as it made him feel dizzy and off balance.  In the meantime, his wife has been admitted to hospice care and likely has a day or two left.  He reports that he has come to terms with her dying and as much as it makes him sad he does not feel as though he needs medication at this time.   Review of Systems See HPI   Past Medical History:  Diagnosis Date  . GERD (gastroesophageal reflux disease)   . Hyperlipidemia   . Hypertension    EKG and chest x ray 8/12 [facility]IC/states had stress test 2 yrs ago- doesnt remember where- not in EP[facility]  . Spinal stenosis    lumbar    Social History   Socioeconomic History  . Marital status: Married    Spouse name: [name]  . Number of children: Not on file  . Years of education: Not on file  . Highest education level: Not on file  Occupational History  . Occupation: Estate agent: [employer]  Tobacco Use  . Smoking status: Never Smoker  . Smokeless tobacco: Never Used  . Tobacco comment: per patient he never smoke. 11/25/2014 /rc  Vaping Use  . Vaping Use: Never used  Substance and Sexual Activity  . Alcohol use: Yes    Alcohol/week: 2.0 standard drinks    Types: 2 Standard drinks or equivalent per week    Comment: socially- occ beer  . Drug use: No  . Sexual activity: Not Currently  Other Topics Concern  . Not on file  Social History Narrative   Works 3 nights a week at [employer].    Previously worked as a Freight forwarder for [employer].   Married to [place]. 2 caffeinated  drinks per day. Does not exercise he says secondary to back problems.  Lives in a one story home.     Has 2 children.  Education: some college. He is an is in [facility] from [place] Strain:   . Difficulty of Paying Living Expenses:   Food Insecurity:   . Worried About Charity fundraiser in the Last Year:   . Arboriculturist in the Last Year:   Transportation Needs:   . Film/video editor (Medical):   Marland Kitchen Lack of Transportation (Non-Medical):   Physical Activity:   . Days of Exercise per Week:   . Minutes of Exercise per Session:   Stress:   . Feeling of Stress :   Social Connections:   . Frequency of Communication with Friends and Family:   . Frequency of Social Gatherings with Friends and Family:   . Attends Religious Services:   . Active Member of Clubs or Organizations:   . Attends Archivist Meetings:   Marland Kitchen Marital Status:   Intimate Partner Violence:   . Fear of Current or Ex-Partner:   . Emotionally Abused:   Marland Kitchen Physically Abused:   .  Sexually Abused:     Past Surgical History:  Procedure Laterality Date  . COLONOSCOPY    . CYSTOSCOPY  12/30/2011   Procedure: CYSTOSCOPY;  Surgeon: Reece Packer, MD;  Location: [facility];  Service: Urology;  Laterality: N/A;  . EYE SURGERY Bilateral 06/18/2017   cataract sx with lens implant  . HERNIA REPAIR  2013  . HERNIA REPAIR Right    age  89  . LUMBAR LAMINECTOMY/DECOMPRESSION MICRODISCECTOMY N/A 11/09/2013   Procedure: Lumbar Laminectomy/Decompression, Microdiscectomy Lumbar Three-Four, Four-Five, with Coflex;  Surgeon: Faythe Ghee, MD;  Location: [facility];  Service: Neurosurgery;  Laterality: N/A;  Lumbar Laminectomy/Decompression, Microdiscectomy Lumbar Three-Four, Four-Five, with Coflex  . PAROTIDECTOMY Left 11/17/2015   Procedure: LEFT PAROTIDECTOMY;  Surgeon: Izora Gala, MD;  Location: [place];  Service: ENT;  Laterality: Left;  .  PROSTATE SURGERY  2013  . TONSILLECTOMY    . trigger thumb  1980   right    Family History  Problem Relation Age of Onset  . Hypertension Unknown   . Cancer Mother        bone?  . Myasthenia gravis Father   . Heart disease Brother     Allergies  Allergen Reactions  . Penicillins Swelling    CHILDHOOD ALLERGY Has patient had a PCN reaction causing immediate rash, facial/tongue/throat swelling, SOB or lightheadedness with hypotension: Yes Has patient had a PCN reaction causing severe rash involving mucus membranes or skin necrosis: No Has patient had a PCN reaction that required hospitalization: No Has patient had a PCN reaction occurring within the last 10 years: No If all of the above answers are "NO", then may proceed with Cephalosporin use.   . Gabapentin     "MADE ME FEEL CRAZY"    Current Outpatient Medications on File Prior to Visit  Medication Sig Dispense Refill  . atorvastatin (LIPITOR) 10 MG tablet TAKE ONE TABLET BY MOUTH DAILY 90 tablet 2  . baclofen (LIORESAL) 10 MG tablet TAKE ONE TABLET BY MOUTH THREE TIMES A DAY AS NEEDED FOR MUSCLE SPASMS 30 tablet 0  . fluticasone (FLONASE) 50 MCG/ACT nasal spray Place 2 sprays into both nostrils daily. 16 g 6  . levothyroxine (SYNTHROID) 50 MCG tablet TAKE ONE TABLET BY MOUTH DAILY 90 tablet 2  . losartan (COZAAR) 25 MG tablet TAKE ONE TABLET BY MOUTH DAILY * DUE FOR YEARLY PHYSICAL 90 tablet 0  . Multiple Vitamins-Minerals (PRESERVISION AREDS 2 PO) Take by mouth.     No current facility-administered medications on file prior to visit.    BP (!) 154/104   Temp 97.9 F (36.6 C)   Wt 237 lb (107.5 kg)   BMI 38.54 kg/m       Objective:   Physical Exam Vitals and nursing note reviewed.  Constitutional:      Appearance: Normal appearance.  Cardiovascular:     Rate and Rhythm: Normal rate and regular rhythm.     Pulses: Normal pulses.     Heart sounds: Normal heart sounds.  Pulmonary:     Effort: Pulmonary  effort is normal.     Breath sounds: Normal breath sounds.  Skin:    General: Skin is warm and dry.  Neurological:     General: No focal deficit present.     Mental Status: He is alert and oriented to person, place, and time.  Psychiatric:        Mood and Affect: Mood normal.        Thought  Content: Thought content normal.        Judgment: Judgment normal.       Assessment & Plan:  1. Mood disorder ([place]) -He was advised to follow-up if needed.  Likely since he has come to terms with his loved one dying his anxiety and anger have dissipated.  He was advised to take advantage of services that hospice provides for himself and other family members.  [name], NP

## 2020-06-11 ENCOUNTER — Telehealth: Payer: Self-pay | Admitting: Adult Health

## 2020-06-11 NOTE — Telephone Encounter (Signed)
Returned call to Mr. Costanzo and he stated that he wanted to let Tommi Rumps know that his wife Eliav Mechling passed away on last 01/25/23.

## 2020-06-11 NOTE — Telephone Encounter (Signed)
Pt is calling wanting a call back to speak about a personal matter with his spouse he would not elaborate.  Pt would like to have a call back.

## 2020-06-12 ENCOUNTER — Telehealth: Payer: Self-pay | Admitting: Adult Health

## 2020-06-12 NOTE — Telephone Encounter (Signed)
Patient wanted to know if they can re-establish care with you again. Was your patient back in 2015. Please advise.

## 2020-06-13 NOTE — Telephone Encounter (Signed)
Appointment has been made. No further questions.

## 2020-06-13 NOTE — Telephone Encounter (Signed)
Received verbal OK from Dr Madilyn Fireman. Please call and set patient up to see her, thanks

## 2020-06-17 ENCOUNTER — Ambulatory Visit (INDEPENDENT_AMBULATORY_CARE_PROVIDER_SITE_OTHER): Payer: Medicare HMO

## 2020-06-17 ENCOUNTER — Ambulatory Visit (INDEPENDENT_AMBULATORY_CARE_PROVIDER_SITE_OTHER): Payer: Medicare HMO | Admitting: Sports Medicine

## 2020-06-17 DIAGNOSIS — G5603 Carpal tunnel syndrome, bilateral upper limbs: Secondary | ICD-10-CM

## 2020-06-17 DIAGNOSIS — H8110 Benign paroxysmal vertigo, unspecified ear: Secondary | ICD-10-CM | POA: Insufficient documentation

## 2020-06-17 DIAGNOSIS — M1712 Unilateral primary osteoarthritis, left knee: Secondary | ICD-10-CM | POA: Diagnosis not present

## 2020-06-17 DIAGNOSIS — I4891 Unspecified atrial fibrillation: Secondary | ICD-10-CM | POA: Diagnosis not present

## 2020-06-17 DIAGNOSIS — H8113 Benign paroxysmal vertigo, bilateral: Secondary | ICD-10-CM | POA: Diagnosis not present

## 2020-06-17 DIAGNOSIS — I480 Paroxysmal atrial fibrillation: Secondary | ICD-10-CM | POA: Insufficient documentation

## 2020-06-17 NOTE — Assessment & Plan Note (Signed)
Kaydence also has osteoarthritis of his left knee, last injected in November 2020. He is actually doing okay today, I am going to put him into physical therapy to do some aquatic therapy, and we can hold off on injection for now.

## 2020-06-17 NOTE — Assessment & Plan Note (Signed)
Adding vestibular rehabilitation.  Mostly right-sided. Further follow-up of this with his PCP.

## 2020-06-17 NOTE — Assessment & Plan Note (Addendum)
This is a pleasant 86 year old male, he did really well with a median nerve hydrodissection, bilateral back in April. Now having recurrence of numbness, tingling on the left side. Repeat median nerve hydrodissection.  Lindy's wife has unfortunately passed away, he is having trouble finding what to do with his time.

## 2020-06-17 NOTE — Assessment & Plan Note (Signed)
Glenn Ortega showed me a rhythm strip from EMS, he does appear to be in atrial fibrillation without rapid ventricular response. He will need to discuss this with his PCP, he has started seeing Dr. Madilyn Fireman again.

## 2020-06-17 NOTE — Progress Notes (Signed)
    Procedures performed today:    Procedure: Real-time Ultrasound Guided hydrodissection of the left median nerve at the carpal tunnel Device: Samsung HS60 Verbal informed consent obtained.  Time-out conducted.  Noted no overlying erythema, induration, or other signs of local infection.  Skin prepped in a sterile fashion.  Local anesthesia: Topical Ethyl chloride.  With sterile technique and under real time ultrasound guidance: Using a 25-gauge needle advanced into the carpal tunnel, taking care to avoid intraneural injection I injected medication both superficial to and deep to the median nerve freeing it from surrounding structures, I then redirected the needle deep and injected further medication around the flexor tendons deep within the carpal tunnel for a total of 1 cc kenalog 40, 5 cc 1% lidocaine without epinephrine. Completed without difficulty  Advised to call if fevers/chills, erythema, induration, drainage, or persistent bleeding.  Images permanently stored and available for review in the ultrasound unit.  Impression: Technically successful ultrasound guided median nerve hydrodissection.  Independent interpretation of notes and tests performed by another provider:   None.  Brief History, Exam, Impression, and Recommendations:    Carpal tunnel syndrome, bilateral This is a pleasant 86 year old male, he did really well with a median nerve hydrodissection, bilateral back in April. Now having recurrence of numbness, tingling on the left side. Repeat median nerve hydrodissection.  Glenn Ortega's wife has unfortunately passed away, he is having trouble finding what to do with his time.  Primary osteoarthritis of left knee Glenn Ortega also has osteoarthritis of his left knee, last injected in November 2020. He is actually doing okay today, I am going to put him into physical therapy to do some aquatic therapy, and we can hold off on injection for now.  Atrial fibrillation ([place]) Glenn Ortega  showed me a rhythm strip from EMS, he does appear to be in atrial fibrillation without rapid ventricular response. He will need to discuss this with his PCP, he has started seeing Dr. Madilyn Fireman again.  BPPV (benign paroxysmal positional vertigo) Adding vestibular rehabilitation.  Mostly right-sided. Further follow-up of this with his PCP.    ___________________________________________ Glenn Ortega, M.D., ABFM., CAQSM. Primary Care and [place] Instructor of [employer]

## 2020-06-18 ENCOUNTER — Other Ambulatory Visit: Payer: Self-pay

## 2020-06-18 ENCOUNTER — Encounter: Payer: Self-pay | Admitting: Family Medicine

## 2020-06-18 ENCOUNTER — Ambulatory Visit (INDEPENDENT_AMBULATORY_CARE_PROVIDER_SITE_OTHER): Payer: Medicare HMO | Admitting: Family Medicine

## 2020-06-18 ENCOUNTER — Ambulatory Visit (INDEPENDENT_AMBULATORY_CARE_PROVIDER_SITE_OTHER): Payer: Medicare HMO | Admitting: Rehabilitative and Restorative Service Providers"

## 2020-06-18 ENCOUNTER — Encounter: Payer: Self-pay | Admitting: Rehabilitative and Restorative Service Providers"

## 2020-06-18 ENCOUNTER — Telehealth: Payer: Self-pay

## 2020-06-18 ENCOUNTER — Ambulatory Visit (INDEPENDENT_AMBULATORY_CARE_PROVIDER_SITE_OTHER): Payer: Medicare HMO

## 2020-06-18 VITALS — BP 133/82 | HR 77 | Ht 66.0 in | Wt 238.0 lb

## 2020-06-18 DIAGNOSIS — Z6838 Body mass index (BMI) 38.0-38.9, adult: Secondary | ICD-10-CM | POA: Diagnosis not present

## 2020-06-18 DIAGNOSIS — R05 Cough: Secondary | ICD-10-CM

## 2020-06-18 DIAGNOSIS — G8929 Other chronic pain: Secondary | ICD-10-CM

## 2020-06-18 DIAGNOSIS — I4891 Unspecified atrial fibrillation: Secondary | ICD-10-CM

## 2020-06-18 DIAGNOSIS — R600 Localized edema: Secondary | ICD-10-CM | POA: Diagnosis not present

## 2020-06-18 DIAGNOSIS — R059 Cough, unspecified: Secondary | ICD-10-CM

## 2020-06-18 DIAGNOSIS — R0602 Shortness of breath: Secondary | ICD-10-CM | POA: Diagnosis not present

## 2020-06-18 DIAGNOSIS — M25562 Pain in left knee: Secondary | ICD-10-CM | POA: Diagnosis not present

## 2020-06-18 DIAGNOSIS — H8112 Benign paroxysmal vertigo, left ear: Secondary | ICD-10-CM | POA: Diagnosis not present

## 2020-06-18 DIAGNOSIS — Z23 Encounter for immunization: Secondary | ICD-10-CM

## 2020-06-18 DIAGNOSIS — R2689 Other abnormalities of gait and mobility: Secondary | ICD-10-CM

## 2020-06-18 DIAGNOSIS — M6281 Muscle weakness (generalized): Secondary | ICD-10-CM

## 2020-06-18 MED ORDER — APIXABAN 5 MG PO TABS: 5.0000 mg | ORAL_TABLET | Freq: Two times a day (BID) | ORAL | 3 refills | Status: DC

## 2020-06-18 NOTE — Telephone Encounter (Signed)
[name] called back and is requesting [facility] [place]. The same as the one Dr Madilyn Fireman sent his daughter. Please advise.

## 2020-06-18 NOTE — Assessment & Plan Note (Addendum)
New Diagnosis.Mildly symptomatic.  Discussed diagnosis. Will start Eliquis.  Schedule an Echo. Refer to cardiology.  Since he is greater than 80 I do need to get updated renal function the last one I could find on file was over a year old.  Need to make sure his creatinine is under 1.5 to be able to treat with 5 mg of Eliquis twice daily.  This patients CHA2DS2-VASc Score and unadjusted Ischemic Stroke Rate (% per year) is equal to 3.2 % stroke rate/year from a score of 3  Above score calculated as 1 point each if present [CHF, HTN, DM, Vascular=MI/PAD/Aortic Plaque, Age if 65-74, or Male] Above score calculated as 2 points each if present [Age > 75, or Stroke/TIA/TE]

## 2020-06-18 NOTE — Assessment & Plan Note (Signed)
He would like to be referred to the bariatric clinic to help work on weight management.  He would prefer [facility] location referral placed today.  I think he would benefit significantly from intervention.

## 2020-06-18 NOTE — Telephone Encounter (Signed)
No problem.  Order placed in office visit today.

## 2020-06-18 NOTE — Progress Notes (Addendum)
Established Patient Office Visit  Subjective:  Patient ID: Glenn Ortega, male    DOB: 18-Feb-1934  Age: 86 y.o. MRN: [id number]  CC:  Chief Complaint  Patient presents with  . Dizziness    HPI Glenn Ortega presents for dizziness.  Patient reports that on Sunday he woke up feeling like he was having vertigo.  The dizziness sort of persisted so at that point he went and checked his blood pressure he says that it was quite high and so he actually called EMS they came and evaluated him and did an EKG which showed possible atrial fibrillation.  He says he continued to feel dizzy the rest of the day he opted not to go to the hospital at that time.  He says he still feels just a little weak little off.  Not as bad as he did on Sunday.  He has noticed a little bit more more shortness of breath with activity he says he also noticed a little bit more swelling in his extremities over the last couple of days.  No chest pain.   He is also transferring care here.  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  He says that he did like his new provider but just always felt comfortable here and wanted to return back here.  He also complains that he has had some cough for the last couple of weeks that seems to happen only when he drinks something cold.  Past Medical History:  Diagnosis Date  . GERD (gastroesophageal reflux disease)   . Hyperlipidemia   . Hypertension    EKG and chest x ray 8/12 [facility]/states had stress test 2 yrs ago- doesnt remember where- not in EPIC  . Spinal stenosis    lumbar    Past Surgical History:  Procedure Laterality Date  . COLONOSCOPY    . CYSTOSCOPY  12/30/2011   Procedure: CYSTOSCOPY;  Surgeon: Reece Packer, MD;  Location: [facility];  Service: Urology;  Laterality: N/A;  . EYE SURGERY Bilateral 06/18/2017   cataract sx with lens implant  . HERNIA REPAIR  2013  .  HERNIA REPAIR Right    age  46  . LUMBAR LAMINECTOMY/DECOMPRESSION MICRODISCECTOMY N/A 11/09/2013   Procedure: Lumbar Laminectomy/Decompression, Microdiscectomy Lumbar Three-Four, Four-Five, with Coflex;  Surgeon: Faythe Ghee, MD;  [facility];  Service: Neurosurgery;  Laterality: N/A;  Lumbar Laminectomy/Decompression, Microdiscectomy Lumbar Three-Four, Four-Five, with Coflex  . PAROTIDECTOMY Left 11/17/2015   Procedure: LEFT PAROTIDECTOMY;  Surgeon: Izora Gala, MD;  Location: [facility];  Service: ENT;  Laterality: Left;  . PROSTATE SURGERY  2013  . TONSILLECTOMY    . trigger thumb  1980   right    Family History  Problem Relation Age of Onset  . Hypertension Other   . Cancer Mother        bone?  . Myasthenia gravis Father   . Heart disease Brother     Social History   Socioeconomic History  . Marital status: Widowed    Spouse name: [name]  . Number of children: Not on file  . Years of education: Not on file  . Highest education level: Not on file  Occupational History  . Occupation: Estate agent: [employer]  Tobacco Use  . Smoking status: Never Smoker  . Smokeless tobacco: Never Used  .  Tobacco comment: per patient he never smoke. 11/25/2014 /rc  Vaping Use  . Vaping Use: Never used  Substance and Sexual Activity  . Alcohol use: Yes    Alcohol/week: 2.0 standard drinks    Types: 2 Standard drinks or equivalent per week    Comment: socially- occ beer  . Drug use: No  . Sexual activity: Not Currently  Other Topics Concern  . Not on file  Social History Narrative   Works 3 nights a week at [employer].    Previously worked as a Freight forwarder for [employer].   2 caffeinated drinks per day. Does not exercise he says secondary to back problems.  Lives in a one story home.     Has 2 children.  Education: some college. He is an is in [facility] from [place]   Wife passed from [place] in 2021.    Social Determinants of  Health   Financial Resource Strain:   . Difficulty of Paying Living Expenses: Not on file  Food Insecurity:   . Worried About Charity fundraiser in the Last Year: Not on file  . Ran Out of Food in the Last Year: Not on file  Transportation Needs:   . Lack of Transportation (Medical): Not on file  . Lack of Transportation (Non-Medical): Not on file  Physical Activity:   . Days of Exercise per Week: Not on file  . Minutes of Exercise per Session: Not on file  Stress:   . Feeling of Stress : Not on file  Social Connections:   . Frequency of Communication with Friends and Family: Not on file  . Frequency of Social Gatherings with Friends and Family: Not on file  . Attends Religious Services: Not on file  . Active Member of Clubs or Organizations: Not on file  . Attends Archivist Meetings: Not on file  . Marital Status: Not on file  Intimate Partner Violence:   . Fear of Current or Ex-Partner: Not on file  . Emotionally Abused: Not on file  . Physically Abused: Not on file  . Sexually Abused: Not on file    Outpatient Medications Prior to Visit  Medication Sig Dispense Refill  . atorvastatin (LIPITOR) 10 MG tablet TAKE ONE TABLET BY MOUTH DAILY 90 tablet 2  . levothyroxine (SYNTHROID) 50 MCG tablet TAKE ONE TABLET BY MOUTH DAILY 90 tablet 2  . losartan (COZAAR) 25 MG tablet TAKE ONE TABLET BY MOUTH DAILY * DUE FOR YEARLY PHYSICAL 90 tablet 0  . Multiple Vitamins-Minerals (PRESERVISION AREDS 2 PO) Take by mouth.    . baclofen (LIORESAL) 10 MG tablet TAKE ONE TABLET BY MOUTH THREE TIMES A DAY AS NEEDED FOR MUSCLE SPASMS 30 tablet 0  . fluticasone (FLONASE) 50 MCG/ACT nasal spray Place 2 sprays into both nostrils daily. 16 g 6   No facility-administered medications prior to visit.    Allergies  Allergen Reactions  . Penicillins Swelling    CHILDHOOD ALLERGY Has patient had a PCN reaction causing immediate rash, facial/tongue/throat swelling, SOB or lightheadedness  with hypotension: Yes Has patient had a PCN reaction causing severe rash involving mucus membranes or skin necrosis: No Has patient had a PCN reaction that required hospitalization: No Has patient had a PCN reaction occurring within the last 10 years: No If all of the above answers are "NO", then may proceed with Cephalosporin use.   . Gabapentin     "MADE ME FEEL CRAZY"    ROS Review  of Systems    Objective:    Physical Exam Constitutional:      Appearance: He is well-developed.  HENT:     Head: Normocephalic and atraumatic.  Cardiovascular:     Rate and Rhythm: Normal rate.     Heart sounds: Normal heart sounds.     Comments: Irregular rhythm Pulmonary:     Effort: Pulmonary effort is normal.     Breath sounds: Normal breath sounds.  Musculoskeletal:     Comments: Trace pitting edema around the ankles bilaterally.  Skin:    General: Skin is warm and dry.  Neurological:     Mental Status: He is alert and oriented to person, place, and time.  Psychiatric:        Behavior: Behavior normal.     BP 133/82   Pulse 77   Ht 5\' 6"  (1.676 m)   Wt 238 lb (108 kg)   SpO2 97%   BMI 38.41 kg/m  Wt Readings from Last 3 Encounters:  06/18/20 238 lb (108 kg)  06/03/20 237 lb (107.5 kg)  05/02/20 235 lb (106.6 kg)     There are no preventive care reminders to display for this patient.  There are no preventive care reminders to display for this patient.  Lab Results  Component Value Date   TSH 2.62 11/08/2019   Lab Results  Component Value Date   WBC 11.6 (H) 06/18/2020   HGB 15.1 06/18/2020   HCT 44.5 06/18/2020   MCV 85.7 06/18/2020   PLT 272 06/18/2020   Lab Results  Component Value Date   NA 141 09/27/2018   K 4.1 09/27/2018   CO2 29 09/27/2018   GLUCOSE 104 (H) 09/27/2018   BUN 23 09/27/2018   CREATININE 1.28 09/27/2018   BILITOT 0.5 09/27/2018   ALKPHOS 37 (L) 09/27/2018   AST 15 09/27/2018   ALT 14 09/27/2018   PROT 6.7 09/27/2018   ALBUMIN 4.0  09/27/2018   CALCIUM 8.8 09/27/2018   GFR 57.15 (L) 09/27/2018   Lab Results  Component Value Date   CHOL 165 09/27/2018   Lab Results  Component Value Date   HDL 61.70 09/27/2018   Lab Results  Component Value Date   LDLCALC 82 09/27/2018   Lab Results  Component Value Date   TRIG 109.0 09/27/2018   Lab Results  Component Value Date   CHOLHDL 3 09/27/2018   No results found for: HGBA1C    Assessment & Plan:   Problem List Items Addressed This Visit      Cardiovascular and Mediastinum   Atrial fibrillation ([place])    New Diagnosis.Mildly symptomatic.  Discussed diagnosis. Will start Eliquis.  Schedule an Echo. Refer to cardiology.  Since he is greater than 80 I do need to get updated renal function the last one I could find on file was over a year old.  Need to make sure his creatinine is under 1.5 to be able to treat with 5 mg of Eliquis twice daily.  This patients CHA2DS2-VASc Score and unadjusted Ischemic Stroke Rate (% per year) is equal to 3.2 % stroke rate/year from a score of 3  Above score calculated as 1 point each if present [CHF, HTN, DM, Vascular=MI/PAD/Aortic Plaque, Age if 65-74, or Male] Above score calculated as 2 points each if present [Age > 75, or Stroke/TIA/TE]        Relevant Medications   apixaban (ELIQUIS) 5 MG TABS tablet   Other Relevant Orders   Ambulatory referral to Cardiology  ECHOCARDIOGRAM COMPLETE   Comprehensive metabolic panel   CBC with Differential/Platelet (Completed)   TSH     Other   BMI 38.0-38.9,adult    He would like to be referred to the bariatric clinic to help work on weight management.  He would prefer [facility] location referral placed today.  I think he would benefit significantly from intervention.      Relevant Orders   Amb Ref to Medical Weight Management   Bilateral lower extremity edema    Other Visit Diagnoses    Need for immunization against influenza    -  Primary   Relevant Orders   Flu Vaccine  QUAD High Dose(Fluad) (Completed)   Cough       Relevant Orders   DG Chest 2 View   SOB (shortness of breath)       Relevant Orders   Comprehensive metabolic panel   CBC with Differential/Platelet (Completed)   TSH     Cough is a little unusual it only seems to happen when he drinks something cold and just over 2 weeks ago is not having any problems swallowing.  But with the recent new onset A. fib and medical ahead and get a chest x-ray as well.  Call if cough worsens or becomes productive.  EKG interpretation, rate of 79 bpm irregularly rhythm consistent with atrial fibrillation.  Also has low voltage QRS.  See scanned document.  Meds ordered this encounter  Medications  . apixaban (ELIQUIS) 5 MG TABS tablet    Sig: Take 1 tablet (5 mg total) by mouth 2 (two) times daily.    Dispense:  60 tablet    Refill:  3    Follow-up: No follow-ups on file.   Time spent 42 minutes in encounter.  New diagnosis of atrial fibrillation.  [name], MD

## 2020-06-18 NOTE — Therapy (Addendum)
[place], [place], [address] Phone: [phone]   Fax:  [phone]  Physical Therapy Evaluation and Discharge Summary  Patient Details  Name: Glenn Ortega MRN: [id number] Date of Birth: 10/25/1933 Referring Provider (PT): Aundria Mems, MD   Encounter Date: 06/18/2020   PT End of Session - 06/18/20 0745    Visit Number 1    Number of Visits 12    Date for PT Re-Evaluation 07/30/20    Authorization Type [employer]    PT Start Time 0715    PT Stop Time 0808    PT Time Calculation (min) 53 min           Past Medical History:  Diagnosis Date  . GERD (gastroesophageal reflux disease)   . Hyperlipidemia   . Hypertension    EKG and chest x ray 8/12 EPIC/states had stress test 2 yrs ago- doesnt remember where- not in EPIC  . Spinal stenosis    lumbar    Past Surgical History:  Procedure Laterality Date  . COLONOSCOPY    . CYSTOSCOPY  12/30/2011   Procedure: CYSTOSCOPY;  Surgeon: Reece Packer, MD;  Location: [facility];  Service: Urology;  Laterality: N/A;  . EYE SURGERY Bilateral 06/18/2017   cataract sx with lens implant  . HERNIA REPAIR  2013  . HERNIA REPAIR Right    age  86  . LUMBAR LAMINECTOMY/DECOMPRESSION MICRODISCECTOMY N/A 11/09/2013   Procedure: Lumbar Laminectomy/Decompression, Microdiscectomy Lumbar Three-Four, Four-Five, with Coflex;  Surgeon: Faythe Ghee, MD;  Location[facility];  Service: Neurosurgery;  Laterality: N/A;  Lumbar Laminectomy/Decompression, Microdiscectomy Lumbar Three-Four, Four-Five, with Coflex  . PAROTIDECTOMY Left 11/17/2015   Procedure: LEFT PAROTIDECTOMY;  Surgeon: Izora Gala, MD;  Location: [facility];  Service: ENT;  Laterality: Left;  . PROSTATE SURGERY  2013  . TONSILLECTOMY    . trigger thumb  1980   right    There were no vitals filed for this visit.    Subjective Assessment - 06/18/20 0713    Subjective The patient  reports he has been caregiving for his wife for the last year.  She passed away last 01-20-2023.  He woke one day this week and noted dizziness and imbalance feeling a pulling sensation to the right.    Pertinent History prior episode of vertigo 10-15 years ago, a-fib, HTN    Patient Stated Goals reduce vertigo, and "hard getting up and down" (due to bilateral knee arthritis)    Currently in Pain? No/denies              [facility] PT Assessment - 06/18/20 1497      Assessment   Medical Diagnosis BPPV and knee OA    Referring Provider (PT) Aundria Mems, MD    Onset Date/Surgical Date 06/15/20    Hand Dominance Right    Prior Therapy none      Precautions   Precautions Fall      Restrictions   Weight Bearing Restrictions No      Balance Screen   Has the patient fallen in the past 6 months No    Has the patient had a decrease in activity level because of a fear of falling?  No    Is the patient reluctant to leave their home because of a fear of falling?  No      Home Environment   Living Environment Private residence    Living Arrangements Alone   wife recently  passed away   Type of [place] Access Level entry    [place] - single point   can't find his cane today; using due to knees and vertigo                 Vestibular Assessment - 06/18/20 0724      Vestibular Assessment   General Observation "I feel lightheaded and my stride is off"      Symptom Behavior   Subjective history of current problem 8/29 began with sudden onset of lightheadedness and pulling sensation to the right    Type of Dizziness  Imbalance;Lightheadedness    Frequency of Dizziness daily    Duration of Dizziness initially constant in the morning, now improving    Symptom Nature Motion provoked    Aggravating Factors Activity in general;Mornings    Relieving Factors Head stationary    Progression of Symptoms Better    History of similar episodes  patient notes h/o vertigo 10-15 years ago      Oculomotor Exam   Oculomotor Alignment Normal    Ocular ROM WFLs    Spontaneous Absent    Gaze-induced  Absent    Smooth Pursuits Intact    Saccades Intact      Vestibulo-Ocular Reflex   VOR 1 Head Only (x 1 viewing) slow gaze provokes 2/10 dizziness at self regulated pace x 5 reps    Comment head impulse test= unable to do b/c of neck guarding      Positional Testing   Sidelying Test Sidelying Right;Sidelying Left    Horizontal Canal Testing Horizontal Canal Right;Horizontal Canal Left      Sidelying Right   Sidelying Right Duration trace vertigo with lying down    Sidelying Right Symptoms No nystagmus   view in room light     Sidelying Left   Sidelying Left Duration none    Sidelying Left Symptoms No nystagmus      Horizontal Canal Right   Horizontal Canal Right Duration nystagmus not viewed in primary position, using alexander's law can view in room light     Horizontal Canal Right Symptoms Ageotrophic   more symptomatic to the right     Horizontal Canal Left   Horizontal Canal Left Duration nystagmus viewed with alexander's law    Horizontal Canal Left Symptoms Ageotrophic              Objective measurements completed on examination: See above findings.        Vestibular Treatment/Exercise - 06/18/20 0743      Vestibular Treatment/Exercise   Vestibular Treatment Provided Canalith Repositioning    Canalith Repositioning Canal Roll Left      Canal Roll Left   Number of Reps  1    Response Details  used Kim maneuver (2011) for horizontal cupulolithiasis due to ageotropic nystagmus with horizontal roll test, patient tolerated well.  Used vibration at first and 4th positions on L mastoid bone                 PT Education - 06/18/20 0744    Education Details nature of BPPv    Person(s) Educated Patient    Methods Explanation;Demonstration;Handout    Comprehension Returned demonstration;Verbalized  understanding               PT Long Term Goals - 06/18/20 0749      PT LONG TERM GOAL #1   Title The  patient will be independent with HEP for LE strengthening and vertigo mgmt.    Time 6    Period Weeks    Target Date 07/30/20      PT LONG TERM GOAL #2   Title The patient will have negative horizontal roll test.    Time 6    Period Weeks    Target Date 07/30/20      PT LONG TERM GOAL #3   Title The patient will report no vertigo upon rising in the morning.    Time 6    Period Weeks    Target Date 07/30/20      PT LONG TERM GOAL #4   Title The patient's balance will be further assessed Merrilee Jansky and gait speed to follow)    Time 6    Period Weeks    Target Date 07/30/20      PT LONG TERM GOAL #5   Title --    Time --    Period --    Target Date 07/30/20                  Plan - 06/18/20 0804    Clinical Impression Statement The patient is an 86 yo male presenting to OP physical therapy for acute vertigo, and chronic knee pain.  He presents today with reports thta vertigo is his primary need for therapy.  He presents with ageotropic nystagmus with horizontal roll test indicating horiozntal cupulolithiasis.He responded well to treatment today noting less sensation of imbalance and dizziness upon rising.  PT to address vertigo and further work on balance, mobility and knee stiffness to improve functional mobility.    Personal Factors and Comorbidities Comorbidity 3+    Comorbidities HTN, a-fib, prior h/o vertigo    Examination-Activity Limitations Bed Mobility;Locomotion Level    Examination-Participation Restrictions Community Activity    Stability/Clinical Decision Making Stable/Uncomplicated    Clinical Decision Making Low    Rehab Potential Good    PT Frequency 2x / week    PT Duration 6 weeks    PT Treatment/Interventions ADLs/Self Care Home Management;Stair training;Gait training;Functional mobility training;Therapeutic activities;Therapeutic  exercise;Neuromuscular re-education;Balance training;Cryotherapy;Iontophoresis 4mg /ml Dexamethasone;Taping;Patient/family education;Dry needling;Manual techniques;Aquatic Therapy    PT Next Visit Plan check BPPV, assess knee joint AROM/PROM and MMT, [place] and gait speed; provide HEP    Consulted and Agree with Plan of Care Patient           Patient will benefit from skilled therapeutic intervention in order to improve the following deficits and impairments:  Dizziness, Decreased range of motion, Abnormal gait, Decreased activity tolerance, Pain  Visit Diagnosis: BPPV (benign paroxysmal positional vertigo), left  Chronic pain of left knee  Muscle weakness (generalized)  Other abnormalities of gait and mobility     Problem List Patient Active Problem List   Diagnosis Date Noted  . Atrial fibrillation ([place]) 06/17/2020  . BPPV (benign paroxysmal positional vertigo) 06/17/2020  . Primary osteoarthritis of left knee 09/17/2019  . Hyperlipidemia 09/27/2018  . 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 Parotid mass 11/17/2015  . Carpal tunnel syndrome, bilateral 08/18/2015  . Unspecified hereditary and idiopathic peripheral neuropathy 06/18/2014  . Lumbar spinal stenosis 11/09/2013  . ESOPHAGEAL REFLUX 04/10/2010  . HYPERTENSION, PULMONARY 04/07/2010  . UNSPECIFIED HEARING LOSS 08/07/2008  . HYPERTENSION, BENIGN 08/07/2008  . BPH (benign prostatic hyperplasia) 08/07/2008  . SCIATICA 08/07/2008     Patient did not return to PT.  Please refer to  initial evaluation for patient status. Thank you for the referral of this patient. Rudell Cobb, MPT   [place], PT 06/18/2020, 8:22 AM  [facility] [place], [place], [address] Phone: [phone]   Fax:  [phone]  Name: Glenn Ortega MRN: [id number] Date of Birth: [birth year]

## 2020-06-19 ENCOUNTER — Encounter: Payer: Self-pay | Admitting: Family Medicine

## 2020-06-19 DIAGNOSIS — B351 Tinea unguium: Secondary | ICD-10-CM | POA: Diagnosis not present

## 2020-06-19 DIAGNOSIS — I739 Peripheral vascular disease, unspecified: Secondary | ICD-10-CM | POA: Diagnosis not present

## 2020-06-19 LAB — COMPREHENSIVE METABOLIC PANEL
AG Ratio: 1.5 (calc) (ref 1.0–2.5)
ALT: 12 U/L (ref 9–46)
AST: 13 U/L (ref 10–35)
Albumin: 4.2 g/dL (ref 3.6–5.1)
Alkaline phosphatase (APISO): 49 U/L (ref 35–144)
BUN: 20 mg/dL (ref 7–25)
CO2: 29 mmol/L (ref 20–32)
Calcium: 9.4 mg/dL (ref 8.6–10.3)
Chloride: 104 mmol/L (ref 98–110)
Creat: 1.11 mg/dL (ref 0.70–1.11)
Globulin: 2.8 g/dL (calc) (ref 1.9–3.7)
Glucose, Bld: 106 mg/dL (ref 65–139)
Potassium: 3.9 mmol/L (ref 3.5–5.3)
Sodium: 141 mmol/L (ref 135–146)
Total Bilirubin: 0.6 mg/dL (ref 0.2–1.2)
Total Protein: 7 g/dL (ref 6.1–8.1)

## 2020-06-19 LAB — CBC WITH DIFFERENTIAL/PLATELET
Absolute Monocytes: 754 cells/uL (ref 200–950)
Basophils Absolute: 35 cells/uL (ref 0–200)
Basophils Relative: 0.3 %
Eosinophils Absolute: 23 cells/uL (ref 15–500)
Eosinophils Relative: 0.2 %
HCT: 44.5 % (ref 38.5–50.0)
Hemoglobin: 15.1 g/dL (ref 13.2–17.1)
Lymphs Abs: 1253 cells/uL (ref 850–3900)
MCH: 29.1 pg (ref 27.0–33.0)
MCHC: 33.9 g/dL (ref 32.0–36.0)
MCV: 85.7 fL (ref 80.0–100.0)
MPV: 10 fL (ref 7.5–12.5)
Monocytes Relative: 6.5 %
Neutro Abs: 9535 cells/uL — ABNORMAL HIGH (ref 1500–7800)
Neutrophils Relative %: 82.2 %
Platelets: 272 10*3/uL (ref 140–400)
RBC: 5.19 10*6/uL (ref 4.20–5.80)
RDW: 13.3 % (ref 11.0–15.0)
Total Lymphocyte: 10.8 %
WBC: 11.6 10*3/uL — ABNORMAL HIGH (ref 3.8–10.8)

## 2020-06-19 LAB — TSH: TSH: 1.61 mIU/L (ref 0.40–4.50)

## 2020-06-19 NOTE — Telephone Encounter (Signed)
Patient advised on MyChart message.

## 2020-06-19 NOTE — Addendum Note (Signed)
Addended by: Narda Rutherford on: 06/19/2020 07:51 AM   Modules accepted: Orders

## 2020-06-25 ENCOUNTER — Telehealth: Payer: Self-pay

## 2020-06-25 ENCOUNTER — Telehealth: Payer: Self-pay | Admitting: Rehabilitative and Restorative Service Providers"

## 2020-06-25 ENCOUNTER — Encounter: Payer: Medicare HMO | Admitting: Rehabilitative and Restorative Service Providers"

## 2020-06-25 DIAGNOSIS — Z6838 Body mass index (BMI) 38.0-38.9, adult: Secondary | ICD-10-CM

## 2020-06-25 NOTE — Telephone Encounter (Signed)
The patient missed his appointment today and PT reached out by phone.  He has not had further episodes of room spinning since evaluation/treatment last week.  He is taking new medication for a-fib and walking more for exercise.  We did not reschedule the visit-- he will call if any sxs return in the next 30 days, if not, we will d/c at that time.  Glenn Ortega, PT

## 2020-06-25 NOTE — Telephone Encounter (Signed)
Glenn Ortega states he is not happy with the current weight loss clinic. He would rather go to [facility]. Please advise.    [name] MD Weight loss service in [place], [place] Address: [address] [place], [place], [place] [address] Phone: [phone]

## 2020-06-25 NOTE — Telephone Encounter (Signed)
Patient referral was sent to [facility] and looking in the referral notes, they have talk to patient. If he needs it sent somewhere else, please let us know where and contact info if available. Jenny Reichmann will be working referrals tomorrow. Thanks

## 2020-06-25 NOTE — Telephone Encounter (Signed)
I really would not recommend [facility] they do a lot of hormonal treatment which can increase risk for heart attack and blood clots.  Certainly that is up to him but I would recommend he go with a more traditional clinic focusing on diet and activity level.

## 2020-06-25 NOTE — Telephone Encounter (Signed)
Pts referral was sent through Epic to [facility]

## 2020-06-26 NOTE — Telephone Encounter (Signed)
Left message advising of recommendations.  

## 2020-06-27 NOTE — Telephone Encounter (Signed)
He would like to go to a different clinic. He agreed he would rather not do hormonal treatment. Is there another clinic he could go to?

## 2020-06-30 NOTE — Telephone Encounter (Signed)
I am not sure where he is currently going is he going to [facility]?  I cannot tell from the notes.  If he is then we can always send him to [facility] and if he is already going to call on then we can send him to [facility].

## 2020-06-30 NOTE — Telephone Encounter (Signed)
Can you place referral. thnak you

## 2020-07-01 ENCOUNTER — Encounter: Payer: Medicare HMO | Admitting: Physical Therapy

## 2020-07-02 NOTE — Telephone Encounter (Signed)
Routed referral through Epic to [facility]

## 2020-07-02 NOTE — Telephone Encounter (Signed)
Dr Madilyn Fireman: patient is not currently going to any clinic. He is OK with Glenn Ortega: referral placed.

## 2020-07-03 ENCOUNTER — Encounter: Payer: Medicare HMO | Admitting: Rehabilitative and Restorative Service Providers"

## 2020-07-07 ENCOUNTER — Encounter: Payer: Medicare HMO | Admitting: Physical Therapy

## 2020-07-08 ENCOUNTER — Ambulatory Visit (INDEPENDENT_AMBULATORY_CARE_PROVIDER_SITE_OTHER): Payer: Medicare HMO

## 2020-07-08 ENCOUNTER — Ambulatory Visit (INDEPENDENT_AMBULATORY_CARE_PROVIDER_SITE_OTHER): Payer: Medicare HMO | Admitting: Sports Medicine

## 2020-07-08 DIAGNOSIS — G5603 Carpal tunnel syndrome, bilateral upper limbs: Secondary | ICD-10-CM | POA: Diagnosis not present

## 2020-07-08 NOTE — Progress Notes (Signed)
    Procedures performed today:    Procedure: Real-time Ultrasound Guided hydrodissection of the right median nerve at the carpal tunnel Device: Samsung HS60 Verbal informed consent obtained.  Time-out conducted.  Noted no overlying erythema, induration, or other signs of local infection.  Skin prepped in a sterile fashion.  Local anesthesia: Topical Ethyl chloride.  With sterile technique and under real time ultrasound guidance: Using a 25-gauge needle advanced into the carpal tunnel, taking care to avoid intraneural injection I injected medication both superficial to and deep to the median nerve freeing it from surrounding structures, I then redirected the needle deep and injected further medication around the flexor tendons deep within the carpal tunnel for a total of 1 cc kenalog 40, 5 cc 1% lidocaine without epinephrine. Completed without difficulty  Advised to call if fevers/chills, erythema, induration, drainage, or persistent bleeding.  Images permanently stored and available for review in PACS.  Impression: Technically successful ultrasound guided median nerve hydrodissection.  Independent interpretation of notes and tests performed by another provider:   None.  Brief History, Exam, Impression, and Recommendations:    Carpal tunnel syndrome, bilateral This is a very pleasant 86 year old male, he has bilateral carpal tunnel syndrome, he had a left median nerve hydrodissection at the end of August, the left side is doing excellent, persistent symptoms on the right so today we performed hydrodissection on the right side. Return as needed.    ___________________________________________ Gwen Her. Dianah Field, M.D., ABFM., CAQSM. Primary Care and [place] Instructor of [employer] [facility] Medicine

## 2020-07-08 NOTE — Assessment & Plan Note (Signed)
This is a very pleasant 86 year old male, he has bilateral carpal tunnel syndrome, he had a left median nerve hydrodissection at the end of August, the left side is doing excellent, persistent symptoms on the right so today we performed hydrodissection on the right side. Return as needed.

## 2020-07-10 ENCOUNTER — Encounter: Payer: Medicare HMO | Admitting: Rehabilitative and Restorative Service Providers"

## 2020-07-14 ENCOUNTER — Other Ambulatory Visit: Payer: Self-pay | Admitting: *Deleted

## 2020-07-14 DIAGNOSIS — I4891 Unspecified atrial fibrillation: Secondary | ICD-10-CM

## 2020-07-14 MED ORDER — APIXABAN 5 MG PO TABS: 5.0000 mg | ORAL_TABLET | Freq: Two times a day (BID) | ORAL | 3 refills | Status: DC

## 2020-07-15 ENCOUNTER — Encounter (INDEPENDENT_AMBULATORY_CARE_PROVIDER_SITE_OTHER): Payer: Self-pay | Admitting: Family Medicine

## 2020-07-15 ENCOUNTER — Other Ambulatory Visit: Payer: Self-pay

## 2020-07-15 ENCOUNTER — Ambulatory Visit (INDEPENDENT_AMBULATORY_CARE_PROVIDER_SITE_OTHER): Payer: Medicare HMO | Admitting: Family Medicine

## 2020-07-15 ENCOUNTER — Ambulatory Visit: Payer: Medicare HMO | Admitting: Family Medicine

## 2020-07-15 VITALS — BP 149/75 | HR 82 | Temp 98.6°F | Ht 65.0 in | Wt 227.0 lb

## 2020-07-15 DIAGNOSIS — Z6837 Body mass index (BMI) 37.0-37.9, adult: Secondary | ICD-10-CM | POA: Diagnosis not present

## 2020-07-15 DIAGNOSIS — Z1331 Encounter for screening for depression: Secondary | ICD-10-CM

## 2020-07-15 DIAGNOSIS — R0602 Shortness of breath: Secondary | ICD-10-CM

## 2020-07-15 DIAGNOSIS — I4891 Unspecified atrial fibrillation: Secondary | ICD-10-CM

## 2020-07-15 DIAGNOSIS — I10 Essential (primary) hypertension: Secondary | ICD-10-CM

## 2020-07-15 DIAGNOSIS — R5383 Other fatigue: Secondary | ICD-10-CM | POA: Diagnosis not present

## 2020-07-15 DIAGNOSIS — R739 Hyperglycemia, unspecified: Secondary | ICD-10-CM | POA: Diagnosis not present

## 2020-07-15 DIAGNOSIS — E782 Mixed hyperlipidemia: Secondary | ICD-10-CM | POA: Diagnosis not present

## 2020-07-15 DIAGNOSIS — E039 Hypothyroidism, unspecified: Secondary | ICD-10-CM | POA: Diagnosis not present

## 2020-07-15 DIAGNOSIS — Z0289 Encounter for other administrative examinations: Secondary | ICD-10-CM

## 2020-07-16 ENCOUNTER — Ambulatory Visit (HOSPITAL_BASED_OUTPATIENT_CLINIC_OR_DEPARTMENT_OTHER)
Admission: RE | Admit: 2020-07-16 | Discharge: 2020-07-16 | Disposition: A | Discharge status: Home / Self Care | Payer: Medicare HMO | Source: Ambulatory Visit | Attending: Family Medicine | Admitting: Family Medicine

## 2020-07-16 DIAGNOSIS — I4891 Unspecified atrial fibrillation: Secondary | ICD-10-CM | POA: Diagnosis not present

## 2020-07-16 LAB — VITAMIN D 25 HYDROXY (VIT D DEFICIENCY, FRACTURES): Vit D, 25-Hydroxy: 16.3 ng/mL — ABNORMAL LOW (ref 30.0–100.0)

## 2020-07-16 LAB — HEMOGLOBIN A1C
Est. average glucose Bld gHb Est-mCnc: 126 mg/dL
Hgb A1c MFr Bld: 6 % — ABNORMAL HIGH (ref 4.8–5.6)

## 2020-07-16 LAB — FOLATE: Folate: 3.3 ng/mL (ref >3.0)

## 2020-07-16 LAB — T4, FREE: Free T4: 1.42 ng/dL (ref 0.82–1.77)

## 2020-07-16 LAB — LIPID PANEL WITH LDL/HDL RATIO
Cholesterol, Total: 188 mg/dL (ref 100–199)
HDL: 87 mg/dL (ref >39)
LDL Chol Calc (NIH): 88 mg/dL (ref 0–99)
LDL/HDL Ratio: 1 ratio (ref 0.0–3.6)
Triglycerides: 72 mg/dL (ref 0–149)
VLDL Cholesterol Cal: 13 mg/dL (ref 5–40)

## 2020-07-16 LAB — ECHOCARDIOGRAM COMPLETE
Area-P 1/2: 3.85 cm2
S' Lateral: 3.07 cm

## 2020-07-16 LAB — INSULIN, RANDOM: INSULIN: 15.2 u[IU]/mL (ref 2.6–24.9)

## 2020-07-16 LAB — T3: T3, Total: 90 ng/dL (ref 71–180)

## 2020-07-16 LAB — VITAMIN B12: Vitamin B-12: 281 pg/mL (ref 232–1245)

## 2020-07-16 MED ORDER — PERFLUTREN LIPID MICROSPHERE
2.0000 mL | INTRAVENOUS | Status: AC | PRN
  Administered 2020-07-16: 10 mL via INTRAVENOUS
  Filled 2020-07-16: qty 2

## 2020-07-17 MED FILL — Perflutren Lipid Microsphere IV Susp 1.1 MG/ML: INTRAVENOUS | Qty: 10 | Status: AC

## 2020-07-17 NOTE — Progress Notes (Signed)
Dear Dr. Madilyn Fireman,   Thank you for referring Glenn Ortega to our clinic. The following note includes my evaluation and treatment recommendations.  Chief Complaint:   OBESITY Glenn Ortega (MR# [id number]) is a 86 y.o. male who presents for evaluation and treatment of obesity and related comorbidities. Current BMI is Body mass index is 37.77 kg/m. Glenn Ortega has been struggling with his weight for many years and has been unsuccessful in either losing weight, maintaining weight loss, or reaching his healthy weight goal.  Glenn Ortega is currently in the action stage of change and ready to dedicate time achieving and maintaining a healthier weight. Glenn Ortega is interested in becoming our patient and working on intensive lifestyle modifications including (but not limited to) diet and exercise for weight loss.  Glenn Ortega is retired, widowed, and lives alone.  He wants to lose weight because it is too tough on his knees and back.  He skips lunch 3 days per week.  He snacks after dinner on cookies, cakes, chips/salsa/  He drinks juice, tea, beer.  His wife passed on 06-07-2020.  They were married for 60 years.  His daughter lives in [place], son is in [place].  He sees his daughter.  Glenn Ortega's habits were reviewed today and are as follows: his desired weight loss is 50 pounds, his heaviest weight ever was 239 pounds, he snacks frequently in the evenings, he skips lunch frequently, he is frequently drinking liquids with calories, he frequently eats larger portions than normal and he struggles with emotional eating.  Depression Screen Glenn Ortega's Food and Mood (modified PHQ-9) score was 5.  Depression screen Glenn Ortega  Decreased Interest 1  Down, Depressed, Hopeless 0  PHQ - 2 Score 1  Altered sleeping 1  Tired, decreased energy 2  Change in appetite 0  Feeling bad or failure about yourself  0  Trouble concentrating 0  Moving slowly or fidgety/restless 1  Suicidal thoughts 0  PHQ-9 Score 5    Subjective:   1. Other fatigue Glenn Ortega denies daytime somnolence and denies waking up still tired. Patent has a history of symptoms of snoring. Glenn Ortega generally gets 7 or 8 hours of sleep per night, and states that he has generally restful sleep. Snoring is present. Apneic episodes are not present. Epworth Sleepiness Score is 6.  He was sent to Cardiology recently due to new onset Afib.  He had labs on 06/18/2020 (TSH, CBC, CMP).  2. Shortness of breath on exertion Glenn Ortega notes increasing shortness of breath with exercising and seems to be worsening over time with weight gain. He notes getting out of breath sooner with activity than he used to. This has gotten worse recently. Glenn Ortega denies shortness of breath at rest or orthopnea.  He was sent to Cardiology recently due to new onset Afib.  He had labs on 06/18/2020 (TSH, CBC, CMP).  3. Hypertension, unspecified type Review: taking medications as instructed, no medication side effects noted, no chest pain on exertion, no dyspnea on exertion, no swelling of ankles.  He is on losartan.   BP Readings from Last 3 Encounters:  07/15/20 (!) 149/75  06/18/20 133/82  06/03/20 (!) 154/104   4. Mixed hyperlipidemia Glenn Ortega has hyperlipidemia and has been trying to improve his cholesterol levels with intensive lifestyle modification including a low saturated fat diet, exercise and weight loss. He denies any chest pain, claudication or myalgias.  He is taking Lipitor.  Lab Results  Component Value Date   ALT 12  06/18/2020   AST 13 06/18/2020   ALKPHOS 37 (L) 09/27/2018   BILITOT 0.6 06/18/2020   Lab Results  Component Value Date   CHOL 188 07/15/2020   HDL 87 07/15/2020   LDLCALC 88 07/15/2020   TRIG 72 07/15/2020   CHOLHDL 3 09/27/2018   5. Atrial fibrillation, unspecified type [facility]) He was started on Eliquis last month.  He was sent to Cardiology, but has not seen them yet.  He is getting an echo tomorrow.  6. Hypothyroidism, unspecified  type Glenn Ortega takes levothyroxine 50 mcg daily.  He endorses fatigue.  Lab Results  Component Value Date   TSH 1.61 06/18/2020   7. Hyperglycemia Glenn Ortega has a history of some elevated blood glucose readings without a diagnosis of diabetes. He denies polyphagia.  8. Depression screening Glenn Ortega was screened for depression as part of his new patient workup.  PHQ-9 is 5.  This is the patient's first visit at [facility].  The patient's NEW PATIENT PACKET that they filled out prior to today's office visit was reviewed at length and some information from that paperwork was also included within the following office visit note.    Included in the packet: current and past health history, medications, allergies, ROS, gynecologic history (women only), surgical history, family history, social history, weight history, weight loss surgery history (for those that have had weight loss surgery), nutritional evaluation, mood and food questionnaire along with a depression screening (PHQ9) on all patients, an Epworth questionnaire, sleep habits questionnaire, patient life and health improvement goals questionnaire. These will all be scanned into the patient's chart under media.   During the visit, I independently reviewed the patient's EKG, bioimpedance scale results, and indirect calorimeter results. I used this information to tailor a meal plan for the patient that will help Glenn Ortega to lose weight and will improve his obesity-related conditions going forward. I performed a medically necessary appropriate examination and/or evaluation. I discussed the assessment and treatment plan with the patient. The patient was provided an opportunity to ask questions and all were answered. The patient agreed with the plan and demonstrated an understanding of the instructions. Labs were ordered at this visit and will be reviewed at the next visit unless more critical results need to be addressed immediately. Clinical  information was updated and documented in the EMR.   Time spent on visit including pre-visit chart review and post-visit care was estimated to be 60-74 minutes.  A separate 15 minutes was spent on risk counseling (see above/below).   Assessment/Plan:   1. Other fatigue Glenn Ortega does feel that his weight is causing his energy to be lower than it should be. Fatigue may be related to obesity, depression or many other causes. Labs will be ordered, and in the meanwhile, Glenn Ortega will focus on self care including making healthy food choices, increasing physical activity and focusing on stress reduction.  - Vitamin B12 - Folate - VITAMIN D 25 Hydroxy (Vit-D Deficiency, Fractures)  2. Shortness of breath on exertion Glenn Ortega does feel that he gets out of breath more easily that he used to when he exercises. Glenn Ortega's shortness of breath appears to be obesity related and exercise induced. He has agreed to work on weight loss and gradually increase exercise to treat his exercise induced shortness of breath. Will continue to monitor closely.  - Vitamin B12 - Folate - Hemoglobin A1c - T3 - T4, free  3. Hypertension, unspecified type Glenn Ortega is working on healthy weight loss and exercise to  improve blood pressure control. We will watch for signs of hypotension as he continues his lifestyle modifications.  Blood pressure is a little high today, but okay for age.  Check labs, follow meal plan, which is low salt, weight loss, which will lowe blood pressure as well.  4. Mixed hyperlipidemia Cardiovascular risk and specific lipid/LDL goals reviewed.  We discussed several lifestyle modifications today and Glenn Ortega will continue to work on diet, exercise and weight loss efforts. Orders and follow up as documented in patient record.  Check labs, continue medications, follow low salt/trans fat meal plan.  Weight loss.  Counseling Intensive lifestyle modifications are the first line treatment for this issue. . Dietary  changes: Increase soluble fiber. Decrease simple carbohydrates. . Exercise changes: Moderate to vigorous-intensity aerobic activity 150 minutes per week if tolerated. . Lipid-lowering medications: see documented in medical record. - Lipid Panel With LDL/HDL Ratio  5. Atrial fibrillation, unspecified type Glenn Ortega) Management per PCP and Cardiology.  Asymptomatic today without complaints of heart palpitations.    - Vitamin B12 - Folate - Hemoglobin A1c - Insulin, random - Lipid Panel With LDL/HDL Ratio - T3 - T4, free - VITAMIN D 25 Hydroxy (Vit-D Deficiency, Fractures)  6. Hypothyroidism, unspecified type Patient with long-standing hypothyroidism, on levothyroxine therapy. He appears euthyroid. Orders and follow up as documented in patient record.  Check labs, continue medications, prudent nutritional plan.  Counseling . Good thyroid control is important for overall health. Supratherapeutic thyroid levels are dangerous and will not improve weight loss results. . The correct way to take levothyroxine is fasting, with water, separated by at least 30 minutes from breakfast, and separated by more than 4 hours from calcium, iron, multivitamins, acid reflux medications (PPIs).   - T3 - T4, free  7. Hyperglycemia Glenn Ortega was given approximately 15 minutes of counseling today regarding prevention of hyperglycemia. He was advised of hyperglycemia causes and the fact hyperglycemia is often asymptomatic. Linell was instructed to avoid skipping meals, eat regular protein rich meals and schedule low calorie but protein rich snacks as needed.   - Hemoglobin A1c - Insulin, random  8. Depression screening Depression screen is mildly positive today.  9. Class 2 severe obesity with serious comorbidity and body mass index (BMI) of 37.0 to 37.9 in adult, unspecified obesity type [facility])  Ashley is currently in the action stage of change and his goal is to continue with weight loss efforts. I recommend  Kveon begin the structured treatment plan as follows:  He has agreed to the Category 1 Plan.  Exercise goals: As is.   Behavioral modification strategies: increasing lean protein intake, decreasing simple carbohydrates, no skipping meals, meal planning and cooking strategies, keeping healthy foods in the home, avoiding temptations and planning for success.  He was informed of the importance of frequent follow-up visits to maximize his success with intensive lifestyle modifications for his multiple health conditions. He was informed we would discuss his lab results at his next visit unless there is a critical issue that needs to be addressed sooner. Kha agreed to keep his next visit at the agreed upon time to discuss these results.  Objective:   Blood pressure (!) 149/75, pulse 82, temperature 98.6 F (37 C), height 5\' 5"  (1.651 m), weight 227 lb (103 kg), SpO2 97 %. Body mass index is 37.77 kg/m.  Indirect Calorimeter completed today shows a VO2 of 196 and a REE of 1366.  His calculated basal metabolic rate is 6144 thus his basal metabolic rate is  worse than expected.  General: Cooperative, alert, well developed, in no acute distress. HEENT: Conjunctivae and lids unremarkable. Cardiovascular: Regular rhythm.  Lungs: Normal work of breathing. Neurologic: No focal deficits.   Lab Results  Component Value Date   CREATININE 1.11 06/18/2020   BUN 20 06/18/2020   NA 141 06/18/2020   K 3.9 06/18/2020   CL 104 06/18/2020   CO2 29 06/18/2020   Lab Results  Component Value Date   ALT 12 06/18/2020   AST 13 06/18/2020   ALKPHOS 37 (L) 09/27/2018   BILITOT 0.6 06/18/2020   Lab Results  Component Value Date   HGBA1C 6.0 (H) 07/15/2020   Lab Results  Component Value Date   INSULIN 15.2 07/15/2020   Lab Results  Component Value Date   TSH 1.61 06/18/2020   Lab Results  Component Value Date   CHOL 188 07/15/2020   HDL 87 07/15/2020   LDLCALC 88 07/15/2020   TRIG 72  07/15/2020   CHOLHDL 3 09/27/2018   Lab Results  Component Value Date   WBC 11.6 (H) 06/18/2020   HGB 15.1 06/18/2020   HCT 44.5 06/18/2020   MCV 85.7 06/18/2020   PLT 272 06/18/2020   Obesity Behavioral Intervention:   Approximately 15 minutes were spent on the discussion below.  ASK: We discussed the diagnosis of obesity with [name] today and Ishaaq agreed to give [place] permission to discuss obesity behavioral modification therapy today.  ASSESS: Axten has the diagnosis of obesity and his BMI today is 37.8. Coran is in the action stage of change.   ADVISE: Uday was educated on the multiple health risks of obesity as well as the benefit of weight loss to improve his health. He was advised of the need for long term treatment and the importance of lifestyle modifications to improve his current health and to decrease his risk of future health problems.  AGREE: Multiple dietary modification options and treatment options were discussed and Davyd agreed to follow the recommendations documented in the above note.  ARRANGE: Kallum was educated on the importance of frequent visits to treat obesity as outlined per CMS and USPSTF guidelines and agreed to schedule his next follow up appointment today.  Attestation Statements:   Reviewed by clinician on day of visit: allergies, medications, problem list, medical history, surgical history, family history, social history, and previous encounter notes.  I, Water quality scientist, CMA, am acting as Location manager for [employer], DO.  I have reviewed the above documentation for accuracy and completeness, and I agree with the above. -  Mellody Dance, DO

## 2020-07-24 ENCOUNTER — Other Ambulatory Visit: Payer: Self-pay

## 2020-07-24 ENCOUNTER — Ambulatory Visit (INDEPENDENT_AMBULATORY_CARE_PROVIDER_SITE_OTHER): Payer: Medicare HMO | Admitting: Interventional Cardiology

## 2020-07-24 ENCOUNTER — Encounter: Payer: Self-pay | Admitting: Interventional Cardiology

## 2020-07-24 VITALS — BP 128/80 | HR 80 | Ht 65.0 in | Wt 227.2 lb

## 2020-07-24 DIAGNOSIS — E039 Hypothyroidism, unspecified: Secondary | ICD-10-CM | POA: Diagnosis not present

## 2020-07-24 DIAGNOSIS — I4891 Unspecified atrial fibrillation: Secondary | ICD-10-CM | POA: Diagnosis not present

## 2020-07-24 DIAGNOSIS — I10 Essential (primary) hypertension: Secondary | ICD-10-CM | POA: Diagnosis not present

## 2020-07-24 NOTE — Patient Instructions (Signed)
Medication Instructions:  Your physician recommends that you continue on your current medications as directed. Please refer to the Current Medication list given to you today.  *If you need a refill on your cardiac medications before your next appointment, please call your pharmacy*   Lab Work: None  If you have labs (blood work) drawn today and your tests are completely normal, you will receive your results only by: . MyChart Message (if you have MyChart) OR . A paper copy in the mail If you have any lab test that is abnormal or we need to change your treatment, we will call you to review the results.   Testing/Procedures: None   Follow-Up: At [facility], you and your health needs are our priority.  As part of our continuing mission to provide you with exceptional heart care, we have created designated Provider Care Teams.  These Care Teams include your primary Cardiologist (physician) and Advanced Practice Providers (APPs -  Physician Assistants and Nurse Practitioners) who all work together to provide you with the care you need, when you need it.  We recommend signing up for the patient portal called "MyChart".  Sign up information is provided on this After Visit Summary.  MyChart is used to connect with patients for Virtual Visits (Telemedicine).  Patients are able to view lab/test results, encounter notes, upcoming appointments, etc.  Non-urgent messages can be sent to your provider as well.   To learn more about what you can do with MyChart, go to [email or web address].    Your next appointment:   12 month(s)  The format for your next appointment:   In Person  Provider:   You may see Jay Varanasi, MD or one of the following Advanced Practice Providers on your designated Care Team:    Dayna Dunn, PA-C  Michele Lenze, PA-C    Other Instructions None  

## 2020-07-24 NOTE — Progress Notes (Signed)
Cardiology Office Note   Date:  07/24/2020   ID:  RAJA LISKA, DOB 1933-11-11, MRN [id number]  PCP:  Hali Marry, MD    No chief complaint on file.  AFib  Wt Readings from Last 3 Encounters:  07/24/20 227 lb 3.2 oz (103.1 kg)  07/15/20 227 lb (103 kg)  06/18/20 238 lb (108 kg)       History of Present Illness: Glenn Ortega is a 86 y.o. male who is being seen today for the evaluation of AFib at the request of Hali Marry, *.  He has a h/o HTN, hypothyroid, hyperlipidemia.  He had surgery for an enlarged prostate which led to hospital stay several years ago.  Diagnosed with AFib in 06/2020.  Echo in 9/21 showed: "Left ventricular ejection fraction, by estimation, is 55 to 60%. The  left ventricle has normal function. The left ventricle has no regional  wall motion abnormalities. Left ventricular diastolic parameters are  indeterminate.  2. Right ventricular systolic function is normal. The right ventricular  size is normal. There is normal pulmonary artery systolic pressure.  3. Left atrial size was mildly dilated.  4. The mitral valve is normal in structure. Trivial mitral valve  regurgitation. No evidence of mitral stenosis.  5. The aortic valve is tricuspid. Aortic valve regurgitation is not  visualized. No aortic stenosis is present.  6. The inferior vena cava is normal in size with greater than 50%  respiratory variability, suggesting right atrial pressure of 3 mmHg. "  Denies : Chest pain. Dizziness. Leg edema. Nitroglycerin use. Orthopnea. Palpitations. Paroxysmal nocturnal dyspnea. Shortness of breath. Syncope.   He walks his new puppy for exercise.  He has a son and daughter nearby.  His wife passed away in early January 12, 2020.   Eliquis is expensive for him but he does not want to use Coumadin due to copays for visits.     Past Medical History:  Diagnosis Date  . Atrial fibrillation ([name])   . Back pain   . GERD (gastroesophageal  reflux disease)   . Hyperlipidemia   . Hypertension    EKG and chest x ray 8/12 EPIC/states had stress test 2 yrs ago- doesnt remember where- not in EPIC  . Spinal stenosis    lumbar  . Thyroid trouble     Past Surgical History:  Procedure Laterality Date  . COLONOSCOPY    . CYSTOSCOPY  12/30/2011   Procedure: CYSTOSCOPY;  Surgeon: Reece Packer, MD;  Location: [facility];  Service: Urology;  Laterality: N/A;  . EYE SURGERY Bilateral 06/18/2017   cataract sx with lens implant  . HERNIA REPAIR  January 12, 2012  . HERNIA REPAIR Right    age  8  . LUMBAR LAMINECTOMY/DECOMPRESSION MICRODISCECTOMY N/A 11/09/2013   Procedure: Lumbar Laminectomy/Decompression, Microdiscectomy Lumbar Three-Four, Four-Five, with Coflex;  Surgeon: Faythe Ghee, MD;  Location: [facility];  Service: Neurosurgery;  Laterality: N/A;  Lumbar Laminectomy/Decompression, Microdiscectomy Lumbar Three-Four, Four-Five, with Coflex  . PAROTIDECTOMY Left 11/17/2015   Procedure: LEFT PAROTIDECTOMY;  Surgeon: Izora Gala, MD;  Location: [place];  Service: ENT;  Laterality: Left;  . PROSTATE SURGERY  2012-01-12  . SALIVARY GLAND SURGERY    . TONSILLECTOMY    . trigger thumb  1980   right     Current Outpatient Medications  Medication Sig Dispense Refill  . apixaban (ELIQUIS) 5 MG TABS tablet Take 1 tablet (5 mg total) by mouth 2 (two) times daily. 180 tablet  3  . atorvastatin (LIPITOR) 10 MG tablet TAKE ONE TABLET BY MOUTH DAILY 90 tablet 2  . levothyroxine (SYNTHROID) 50 MCG tablet TAKE ONE TABLET BY MOUTH DAILY 90 tablet 2  . losartan (COZAAR) 25 MG tablet TAKE ONE TABLET BY MOUTH DAILY * DUE FOR YEARLY PHYSICAL 90 tablet 0  . Multiple Vitamins-Minerals (PRESERVISION AREDS 2 PO) Take by mouth.     No current facility-administered medications for this visit.    Allergies:   Penicillins and Gabapentin    Social History:  The patient  reports that he has never smoked. He has never used smokeless tobacco. He  reports current alcohol use of about 2.0 standard drinks of alcohol per week. He reports that he does not use drugs.   Family History:  The patient's family history includes Cancer in his mother; Heart disease in his brother; Hypertension in an other family member; Myasthenia gravis in his father; Obesity in his father.    ROS:  Please see the history of present illness.   Otherwise, review of systems are positive for cost issues with Eliquis.   All other systems are reviewed and negative.    PHYSICAL EXAM: VS:  BP 128/80   Pulse 80   Ht 5\' 5"  (1.651 m)   Wt 227 lb 3.2 oz (103.1 kg)   SpO2 96%   BMI 37.81 kg/m  , BMI Body mass index is 37.81 kg/m. GEN: Well nourished, well developed, in no acute distress  HEENT: normal  Neck: no JVD, carotid bruits, or masses Cardiac: irregularly irregular; no murmurs, rubs, or gallops,no edema  Respiratory:  clear to auscultation bilaterally, normal work of breathing GI: soft, nontender, nondistended, + BS MS: no deformity or atrophy  Skin: warm and dry, no rash Neuro:  Strength and sensation are intact Psych: euthymic mood, full affect   EKG:   The ekg ordered in 8/21 demonstrates AFib, controlled ventricular response   Recent Labs: 06/18/2020: ALT 12; BUN 20; Creat 1.11; Hemoglobin 15.1; Platelets 272; Potassium 3.9; Sodium 141; TSH 1.61   Lipid Panel    Component Value Date/Time   CHOL 188 07/15/2020 0848   TRIG 72 07/15/2020 0848   HDL 87 07/15/2020 0848   CHOLHDL 3 09/27/2018 0753   VLDL 21.8 09/27/2018 0753   LDLCALC 88 07/15/2020 0848     Other studies Reviewed: Additional studies/ records that were reviewed today with results demonstrating: EMS ECG reviewed.   ASSESSMENT AND PLAN:  1. AFib: Rate controlled. Eliquis for stroke prevention.   Discussed risks of AFib.  Normal LV/RV function.  2. HTN: The current medical regimen is effective;  continue present plan and medications.   3. Hypothyroid: TSH normal at last  check   Current medicines are reviewed at length with the patient today.  The patient concerns regarding his medicines were addressed.  The following changes have been made:  No change  Labs/ tests ordered today include:  No orders of the defined types were placed in this encounter.   Recommend 150 minutes/week of aerobic exercise Low fat, low carb, high fiber diet recommended  Disposition:   FU in 1 year   Signed, Larae Grooms, MD  07/24/2020 11:23 AM    [facility] [address], [place], [place]  [address] Phone: [phone]; Fax: [phone]

## 2020-07-28 ENCOUNTER — Other Ambulatory Visit: Payer: Self-pay | Admitting: Adult Health

## 2020-07-29 ENCOUNTER — Encounter (INDEPENDENT_AMBULATORY_CARE_PROVIDER_SITE_OTHER): Payer: Self-pay | Admitting: Family Medicine

## 2020-07-29 ENCOUNTER — Ambulatory Visit (INDEPENDENT_AMBULATORY_CARE_PROVIDER_SITE_OTHER): Payer: Medicare HMO | Admitting: Family Medicine

## 2020-07-29 ENCOUNTER — Other Ambulatory Visit: Payer: Self-pay

## 2020-07-29 VITALS — BP 118/75 | HR 63 | Temp 97.5°F | Ht 65.0 in | Wt 219.0 lb

## 2020-07-29 DIAGNOSIS — I10 Essential (primary) hypertension: Secondary | ICD-10-CM

## 2020-07-29 DIAGNOSIS — I4891 Unspecified atrial fibrillation: Secondary | ICD-10-CM

## 2020-07-29 DIAGNOSIS — E038 Other specified hypothyroidism: Secondary | ICD-10-CM

## 2020-07-29 DIAGNOSIS — E559 Vitamin D deficiency, unspecified: Secondary | ICD-10-CM | POA: Insufficient documentation

## 2020-07-29 DIAGNOSIS — R7303 Prediabetes: Secondary | ICD-10-CM | POA: Diagnosis not present

## 2020-07-29 DIAGNOSIS — E7849 Other hyperlipidemia: Secondary | ICD-10-CM

## 2020-07-29 MED ORDER — CENTRUM SILVER 50+MEN PO TABS: ORAL_TABLET | ORAL | Status: DC

## 2020-07-29 MED ORDER — VITAMIN D (ERGOCALCIFEROL) 1.25 MG (50000 UNIT) PO CAPS: 50000.0000 [IU] | ORAL_CAPSULE | ORAL | 0 refills | Status: DC

## 2020-07-30 ENCOUNTER — Encounter (INDEPENDENT_AMBULATORY_CARE_PROVIDER_SITE_OTHER): Payer: Self-pay | Admitting: Family Medicine

## 2020-07-30 NOTE — Progress Notes (Signed)
Chief Complaint:   OBESITY Glenn Ortega is here to discuss his progress with his obesity treatment plan along with follow-up of his obesity related diagnoses. Glenn Ortega is on the Category 1 Plan and states he is following his eating plan approximately 99% of the time. Glenn Ortega states he is walking for 30 minutes 7 times per week.  Today's visit was #: 2 Starting weight: 227 lbs Starting date: 07/15/2020 Today's weight: 219 lbs Today's date: 07/29/2020 Total lbs lost to date: 8 lbs Total lbs lost since last in-office visit: 8 lbs   Interim History: Glenn Ortega says he could not eat his whole lunch/meats at once, so he saved it until later in the evening and ate it as a "snack".  He has no hunger.  He craves ice cream, so he has 1 little scoop.    He says the plan is easy to follow and is satisfying overall and he likes it a lot.    This weekend, he will be going away for the weekend with his son.   He also got a new puppy, which he has been walking daily, 3-4 times per day, for 15-20 minutes.  His name is "[place]".   Assessment/Plan:    This is pt's first follow up OV today and recent labs were reviewed with pt and all of pt's questions and concerns were addressed.    Meds ordered this encounter  Medications  . Multiple Vitamins-Minerals (CENTRUM SILVER 50+MEN) TABS    Sig: 1-2 po qd  . Vitamin D, Ergocalciferol, (DRISDOL) 1.25 MG (50000 UNIT) CAPS capsule    Sig: Take 1 capsule (50,000 Units total) by mouth every 7 (seven) days.    Dispense:  4 capsule    Refill:  0  . DISCONTD: Vitamin D, Ergocalciferol, (DRISDOL) 1.25 MG (50000 UNIT) CAPS capsule    Sig: Take 1 capsule (50,000 Units total) by mouth every 7 (seven) days.    Dispense:  4 capsule    Refill:  0     1. Atrial fibrillation, rate controlled Glenn Ortega is taking Eliquis 5 mg twice daily.  He is followed by Cardiology.  No concerns or complaints.  Recently saw Dr. Irish Lack.  Stable.  He will follow-up in 1 year.  Plan:   Continue medications.  Continue prudent nutritional plan and weight loss.     2. Other hyperlipidemia Glenn Ortega has hyperlipidemia and has been trying to improve his cholesterol levels with intensive lifestyle modification including a low saturated fat diet, exercise and weight loss. He denies any chest pain, claudication or myalgias.  He is taking Lipitor 10 mg daily and tolerating it well.  Plan:  Continue Lipitor.  Continue low sat/trans fat diet, prudent nutritional plan, weight loss.  Lab Results  Component Value Date   ALT 12 06/18/2020   AST 13 06/18/2020   ALKPHOS 37 (L) 09/27/2018   BILITOT 0.6 06/18/2020   Lab Results  Component Value Date   CHOL 188 07/15/2020   HDL 87 07/15/2020   LDLCALC 88 07/15/2020   TRIG 72 07/15/2020   CHOLHDL 3 09/27/2018     3. Other specified hypothyroidism Glenn Ortega is taking levothyroxine 50 mcg daily.  No symptoms or concerns.   Plan:  Continue levothyroxine at current dose.  Continue prudent nutritional plan and weight loss.  Lab Results  Component Value Date   TSH 1.61 06/18/2020      4. Essential hypertension Review: taking medications as instructed, no medication side effects noted, no chest pain on exertion,  no dyspnea on exertion, no swelling of ankles.  Glenn Ortega is taking Cozaar, at 25 mg daily.  Denies chest pain and shortness of breath.   Plan:   - Blood pressure currently is at goal  - Patient will continue current treatment regimen and medication change is not required today  - Counseled patient on pathophysiology of disease and discussed various treatment options, which always includes dietary and lifestyle modification as first line.   - Lifestyle changes such as following our low salt, healthy meal plan and engaging in a regular exercise program discussed extensively with patient.   - Ambulatory blood pressure monitoring encouraged at least 2-3 times weekly.  Keep log and bring in every office visit.  Reminded patient  that if they ever feel poorly in any way, to check their blood pressure and pulse as well.  - We will continue to monitor closely alongside PCP/ specialists.  Pt reminded to also f/up with those individuals as instructed by them.       BP Readings from Last 3 Encounters:  07/29/20 118/75  07/24/20 128/80  07/15/20 (!) 149/75     5. Prediabetes Glenn Ortega has a diagnosis of prediabetes based on his elevated HgA1c and was informed this puts him at greater risk of developing diabetes. He continues to work on diet and exercise to decrease his risk of diabetes. He denies nausea or hypoglycemia.  He is not currently taking any medications.  Plan:  We will continue to monitor.  Check A1c and fasting insulin every 3 months.  Continue prudent nutritional plan.  Lab Results  Component Value Date   HGBA1C 6.0 (H) 07/15/2020   Lab Results  Component Value Date   INSULIN 15.2 07/15/2020     6. Vitamin D deficiency  Glenn Ortega has a history of Vitamin D deficiency with resultant generalized fatigue as his primary symptom.  he is taking no vitamin D supplement for this deficiency.   Most recent Vitamin D lab reviewed-  level: 16.3  Plan:   - Discussed importance of vitamin D (as well as calcium) to their health and well-being.   - We reviewed possible symptoms of low Vitamin D including low energy, depressed mood, muscle aches, joint aches, osteoporosis etc.  - We discussed that low Vitamin D levels may be linked to an increased risk of cardiovascular events and even increased risk of cancers- such as colon and breast.   - Educated pt that weight loss will likely improve availability of vitamin D, thus encouraged Glenn Ortega to continue with meal plan and their weight loss efforts to further improve this condition  - I recommend pt take a weekly prescription vit D- see script below- which pt agrees to after discussion of risks and benefits of this medication.      - Informed patient this  may be a lifelong thing, and he was encouraged to continue to take the medicine until pt told otherwise.   We will need to monitor levels regularly ( q 3-4 mo on average )  to keep levels within normal limits.   - All pt's questions and concerns regarding this condition addressed  -Start Vitamin D, Ergocalciferol, (DRISDOL) 1.25 MG (50000 UNIT) CAPS capsule; Take 1 capsule (50,000 Units total) by mouth every 7 (seven) days.  Dispense: 4 capsule; Refill: 0  7. Class 2 severe obesity with serious comorbidity and body mass index (BMI) of 36.0 to 36.9 in adult, unspecified obesity type Glenn Ortega)  Glenn Ortega is currently in the action stage  of change. As such, his goal is to continue with weight loss efforts. He has agreed to the Category 1 Plan.   Exercise goals: As is.  Behavioral modification strategies: decreasing simple carbohydrates, increasing water intake, decreasing liquid calories, meal planning and cooking strategies, keeping healthy foods in the home, travel eating strategies and planning for success.  Glenn Ortega has agreed to follow-up with our clinic in 2-3 weeks. He was informed of the importance of frequent follow-up visits to maximize his success with intensive lifestyle modifications for his multiple health conditions.   Objective:   Blood pressure 118/75, pulse 63, temperature (!) 97.5 F (36.4 C), height 5\' 5"  (1.651 m), weight 219 lb (99.3 kg), SpO2 99 %. Body mass index is 36.44 kg/m.  General: Cooperative, alert, well developed, in no acute distress. HEENT: Conjunctivae and lids unremarkable. Cardiovascular: Regular rhythm.  Lungs: Normal work of breathing. Neurologic: No focal deficits.   Lab Results  Component Value Date   CREATININE 1.11 06/18/2020   BUN 20 06/18/2020   NA 141 06/18/2020   K 3.9 06/18/2020   CL 104 06/18/2020   CO2 29 06/18/2020   Lab Results  Component Value Date   ALT 12 06/18/2020   AST 13 06/18/2020   ALKPHOS 37 (L) 09/27/2018   BILITOT 0.6  06/18/2020   Lab Results  Component Value Date   HGBA1C 6.0 (H) 07/15/2020   Lab Results  Component Value Date   INSULIN 15.2 07/15/2020   Lab Results  Component Value Date   TSH 1.61 06/18/2020   Lab Results  Component Value Date   CHOL 188 07/15/2020   HDL 87 07/15/2020   LDLCALC 88 07/15/2020   TRIG 72 07/15/2020   CHOLHDL 3 09/27/2018   Lab Results  Component Value Date   WBC 11.6 (H) 06/18/2020   HGB 15.1 06/18/2020   HCT 44.5 06/18/2020   MCV 85.7 06/18/2020   PLT 272 06/18/2020   Attestation Statements:   Reviewed by clinician on day of visit: allergies, medications, problem list, medical history, surgical history, family history, social history, and previous encounter notes.  Time spent on visit including pre-visit chart review and post-visit care and charting was 50 minutes.   I, Water quality scientist, CMA, am acting as Location manager for [employer], DO.  I have reviewed the above documentation for accuracy and completeness, and I agree with the above. Marjory Sneddon, D.O.  The [place] was signed into law in 2016 which includes the topic of electronic health records.  This provides immediate access to information in MyChart.  This includes consultation notes, operative notes, office notes, lab results and pathology reports.  If you have any questions about what you read please let us know at your next visit so we can discuss your concerns and take corrective action if need be.  We are right here with you.

## 2020-07-30 NOTE — Telephone Encounter (Signed)
Patient called about receiving a call from Dr.O regarding food questions he had, please give pt a call.

## 2020-07-31 NOTE — Telephone Encounter (Signed)
Please review

## 2020-08-04 NOTE — Telephone Encounter (Signed)
It is just an FYI that I have to forward for you review and incase you want to say thank you.

## 2020-08-12 ENCOUNTER — Other Ambulatory Visit: Payer: Self-pay | Admitting: Adult Health

## 2020-08-14 ENCOUNTER — Encounter: Payer: Self-pay | Admitting: Nurse Practitioner

## 2020-08-14 ENCOUNTER — Other Ambulatory Visit: Payer: Self-pay

## 2020-08-14 ENCOUNTER — Ambulatory Visit (INDEPENDENT_AMBULATORY_CARE_PROVIDER_SITE_OTHER): Payer: Medicare HMO | Admitting: Nurse Practitioner

## 2020-08-14 VITALS — BP 114/66 | HR 67 | Ht 65.0 in | Wt 225.0 lb

## 2020-08-14 DIAGNOSIS — D692 Other nonthrombocytopenic purpura: Secondary | ICD-10-CM

## 2020-08-14 NOTE — Patient Instructions (Signed)
Bleeding Precautions When on Anticoagulant Therapy, Adult Anticoagulant therapy, also called blood thinner therapy, is medicine that helps to prevent and treat blood clots. The medicine works by stopping blood clots from forming or growing. Blood clots that form in your blood vessels can be dangerous. They can break loose and travel to the heart, lungs, or brain. This increases the risk of a heart attack, stroke, or blocked lung artery (pulmonary embolism). Anticoagulants also increase the risk of bleeding. Try to protect yourself from cuts and other injuries that can cause bleeding. It is important to take anticoagulants exactly as told by your health care provider. Why do I need to be on anticoagulant therapy? You may need this medicine if you are at risk of developing a blood clot. Conditions that increase your risk of a blood clot include:  Being born with heart disease or a heart malformation (congenital heart disease).  Developing heart disease.  Having had surgery, such as valve replacement.  Having had a serious accident or other type of severe injury (trauma).  Having certain types of cancer.  Having certain diseases that can increase blood clotting.  Having a high risk of stroke or heart attack.  Having atrial fibrillation (AF). What are the common anticoagulant medicines? There are several types of anticoagulant medicines. The most common types are:  Medicines that you take by mouth (oral medicines), such as: ? Warfarin. ? Novel oral anticoagulants (NOACs), such as:  Direct thrombin inhibitors (dabigatran).  Factor Xa inhibitors (apixaban, edoxaban, and rivaroxaban).  Injections, such as: ? Unfractionated heparin. ? Low molecular weight heparin. These anticoagulants work in different ways to prevent blood clots. They also have different risks and side effects. What do I need to remember while on anticoagulant therapy? Taking anticoagulants  Take your medicine at the  same time every day. If you forget to take your medicine, take it as soon as you remember. Do not double your dosage of medicine if you miss a whole day. Take your normal dose and call your health care provider.  Do not stop taking your medicine unless your health care provider approves. Stopping the medicine can increase your risk of developing a blood clot. Taking other medicines  Take over-the-counter and prescriptions medicines only as told by your health care provider.  Do not take over-the-counter NSAIDs, including aspirin and ibuprofen, while you are on anticoagulant therapy. These medicines increase your risk of dangerous bleeding.  Get approval from your health care provider before you start taking any new medicines, vitamins, or herbal products. Some of these could interfere with your therapy. General instructions  Keep all follow-up visits as told by your health care provider. This is important.  If you are pregnant or trying to get pregnant, talk with a health care provider about anticoagulants. Some of these medicines are not safe to take during pregnancy.  Tell all health care providers, including your dentist, that you are on anticoagulant therapy. It is especially important to tell providers before you have any surgery, medical procedures, or dental work done. What precautions should I take?   Be very careful when using knives, scissors, or other sharp objects.  Use an electric razor instead of a blade.  Do not use toothpicks.  Use a soft-bristled toothbrush. Brush your teeth gently.  Always wear shoes outdoors and wear slippers indoors.  Be careful when cutting your fingernails and toenails.  Place bath mats in the bathroom. If possible, install handrails as well.  Wear gloves while you do  yard work.  Wear your seat belt.  Prevent falls by removing loose rugs and extension cords from areas where you walk. Use a cane or walker if you need it.  Avoid  constipation by: ? Drinking enough fluid to keep your urine clear or pale yellow. ? Eating foods that are high in fiber, such as fresh fruits and vegetables, whole grains, and beans. ? Limiting foods that are high in fat and processed sugars, such as fried and sweet foods.  Do not play contact sports or participate in other activities that have a high risk for injury. What other precautions are important if on warfarin therapy? If you are taking a type of anticoagulant called warfarin, make sure you:  Work with a diet and nutrition specialist (dietitian) to make an eating plan. Do not make any sudden changes to your diet after you have started your eating plan.  Do not drink alcohol. It can interfere with your medicine and increase your risk of an injury that causes bleeding.  Get regular blood tests as told by your health care provider. What are some questions to ask my health care provider?  Why do I need anticoagulant therapy?  What is the best anticoagulant therapy for my condition?  How long will I need anticoagulant therapy?  What are the side effects of anticoagulant therapy?  When should I take my medicine? What should I do if I forget to take it?  Will I need to have regular blood tests?  Do I need to change my diet? Are there foods or drinks that I should avoid?  What activities are safe for me?  What should I do if I want to get pregnant? Contact a health care provider if:  You miss a dose of medicine: ? And you are not sure what to do. ? For more than one day.  You have: ? Menstrual bleeding that is heavier than normal. ? Bloody or brown urine. ? Easy bruising. ? Black and tarry stool or bright red stool. ? Side effects from your medicine.  You feel weak or dizzy.  You become pregnant. Get help right away if:  You have bleeding that will not stop within 20 minutes from: ? The nose. ? The gums. ? A cut on the skin.  You have a severe headache or  stomachache.  You vomit or cough up blood.  You fall or hit your head. Summary  Anticoagulant therapy, also called blood thinner therapy, is medicine that helps to prevent and treat blood clots.  Anticoagulants work in different ways to prevent blood clots. They also have different risks and side effects.  Talk with your health care provider about any precautions that you should take while on anticoagulant therapy. This information is not intended to replace advice given to you by your health care provider. Make sure you discuss any questions you have with your health care provider. Document Revised: 01/24/2019 Document Reviewed: 12/21/2016 Elsevier Patient Education  [place].

## 2020-08-14 NOTE — Assessment & Plan Note (Signed)
Symptoms and presentation consistent with senile purpura related to anticoagulation for chronic atrial fibrillation. No evidence uncontrolled bleeding.  Will obtain CBC and PTT today to monitor platelets and coagulation to determine dosing is not to high.  Discussed recommendations of wearing long sleeve shirts and applying compression immediately to areas that have been bumped to prevent excessive bruising.  Discussed the importance of monitoring for blood in stool, urine, gums, or evidence of purpura and notifying immediately if symptoms arise.  Follow-up if symptoms worsen or if bleeding issues present.

## 2020-08-14 NOTE — Progress Notes (Signed)
Acute Office Visit  Subjective:    Patient ID: Glenn Ortega, male    DOB: Jul 10, 1934, 86 y.o.   MRN: [id number]  Chief Complaint  Patient presents with  . Bleeding/Bruising    HPI Of Glenn Ortega is an 86 year old male in today with concerns for increased bruising that he has noticed since starting Eliquis approximately 6 weeks ago for atrial fibrillation.  He reports that he just wanted to make sure that the bruising he is experiencing is normal and expected and no reason for concern.  He endorses bruising only on his forearms bilaterally in most instances he is aware of hitting his arm prior to the bruise formation.  He denies using any other anticoagulants such as aspirin, ibuprofen, fish oil.  He denies bleeding gums, blood in his urine, or blood in his stool.  Past Medical History:  Diagnosis Date  . Atrial fibrillation ([place])   . Back pain   . GERD (gastroesophageal reflux disease)   . Hyperlipidemia   . Hypertension    EKG and chest x ray 8/12 [facility]/states had stress test 2 yrs ago- doesnt remember where- not in [facility]  . Spinal stenosis    lumbar  . Thyroid trouble     Past Surgical History:  Procedure Laterality Date  . COLONOSCOPY    . CYSTOSCOPY  12/30/2011   Procedure: CYSTOSCOPY;  Surgeon: Reece Packer, MD;  Location: [facility];  Service: Urology;  Laterality: N/A;  . EYE SURGERY Bilateral 06/18/2017   cataract sx with lens implant  . HERNIA REPAIR  2013  . HERNIA REPAIR Right    age  56  . LUMBAR LAMINECTOMY/DECOMPRESSION MICRODISCECTOMY N/A 11/09/2013   Procedure: Lumbar Laminectomy/Decompression, Microdiscectomy Lumbar Three-Four, Four-Five, with Coflex;  Surgeon: Faythe Ghee, MD;  Location: [facility];  Service: Neurosurgery;  Laterality: N/A;  Lumbar Laminectomy/Decompression, Microdiscectomy Lumbar Three-Four, Four-Five, with Coflex  . PAROTIDECTOMY Left 11/17/2015   Procedure: LEFT PAROTIDECTOMY;  Surgeon: Izora Gala, MD;  [facility];  Service: ENT;  Laterality: Left;  . PROSTATE SURGERY  2013  . SALIVARY GLAND SURGERY    . TONSILLECTOMY    . trigger thumb  1980   right    Family History  Problem Relation Age of Onset  . Hypertension Other   . Cancer Mother        bone?  . Myasthenia gravis Father   . Obesity Father   . Heart disease Brother     Social History   Socioeconomic History  . Marital status: Widowed    Spouse name: [name]  . Number of children: Not on file  . Years of education: Not on file  . Highest education level: Not on file  Occupational History  . Occupation: Retired  Tobacco Use  . Smoking status: Never Smoker  . Smokeless tobacco: Never Used  . Tobacco comment: per patient he never smoke. 11/25/2014 /rc  Vaping Use  . Vaping Use: Never used  Substance and Sexual Activity  . Alcohol use: Yes    Alcohol/week: 2.0 standard drinks    Types: 2 Standard drinks or equivalent per week    Comment: socially- occ beer  . Drug use: No  . Sexual activity: Not Currently  Other Topics Concern  . Not on file  Social History Narrative   Works 3 nights a week at [employer].    Previously worked as a Freight forwarder for [employer].   2 caffeinated drinks per day. Does not exercise  he says secondary to back problems.  Lives in a one story home.     Has 2 children.  Education: some college. He is an is in [facility] from [place]   Wife passed from [place] in 2021.    Social Determinants of Health   Financial Resource Strain:   . Difficulty of Paying Living Expenses: Not on file  Food Insecurity:   . Worried About Charity fundraiser in the Last Year: Not on file  . Ran Out of Food in the Last Year: Not on file  Transportation Needs:   . Lack of Transportation (Medical): Not on file  . Lack of Transportation (Non-Medical): Not on file  Physical Activity:   . Days of Exercise per Week: Not on file  . Minutes of Exercise per Session: Not on file  Stress:   . Feeling of  Stress : Not on file  Social Connections:   . Frequency of Communication with Friends and Family: Not on file  . Frequency of Social Gatherings with Friends and Family: Not on file  . Attends Religious Services: Not on file  . Active Member of Clubs or Organizations: Not on file  . Attends Archivist Meetings: Not on file  . Marital Status: Not on file  Intimate Partner Violence:   . Fear of Current or Ex-Partner: Not on file  . Emotionally Abused: Not on file  . Physically Abused: Not on file  . Sexually Abused: Not on file    Outpatient Medications Prior to Visit  Medication Sig Dispense Refill  . apixaban (ELIQUIS) 5 MG TABS tablet Take 1 tablet (5 mg total) by mouth 2 (two) times daily. 180 tablet 3  . atorvastatin (LIPITOR) 10 MG tablet TAKE ONE TABLET BY MOUTH DAILY 90 tablet 2  . levothyroxine (SYNTHROID) 50 MCG tablet TAKE ONE TABLET BY MOUTH DAILY 90 tablet 2  . losartan (COZAAR) 25 MG tablet TAKE 1 TABLET BY MOUTH DAILY *DUE FOR YEARLY PHYSICAL 90 tablet 0  . Multiple Vitamins-Minerals (CENTRUM SILVER 50+MEN) TABS 1-2 po qd    . Multiple Vitamins-Minerals (PRESERVISION AREDS 2 PO) Take by mouth.    . Vitamin D, Ergocalciferol, (DRISDOL) 1.25 MG (50000 UNIT) CAPS capsule Take 1 capsule (50,000 Units total) by mouth every 7 (seven) days. 4 capsule 0   No facility-administered medications prior to visit.    Allergies  Allergen Reactions  . Penicillins Swelling    CHILDHOOD ALLERGY Has patient had a PCN reaction causing immediate rash, facial/tongue/throat swelling, SOB or lightheadedness with hypotension: Yes Has patient had a PCN reaction causing severe rash involving mucus membranes or skin necrosis: No Has patient had a PCN reaction that required hospitalization: No Has patient had a PCN reaction occurring within the last 10 years: No If all of the above answers are "NO", then may proceed with Cephalosporin use.   . Gabapentin     "MADE ME FEEL CRAZY"     Review of Systems All review of systems negative except for what is listed in HPI    Objective:    Physical Exam Vitals and nursing note reviewed.  Constitutional:      Appearance: Normal appearance.  HENT:     Head: Normocephalic.     Nose: Nose normal.     Mouth/Throat:     Mouth: Mucous membranes are moist.     Pharynx: Oropharynx is clear.  Eyes:     Extraocular Movements: Extraocular movements intact.  Conjunctiva/sclera: Conjunctivae normal.     Pupils: Pupils are equal, round, and reactive to light.  Cardiovascular:     Rate and Rhythm: Normal rate.     Pulses: Normal pulses.  Pulmonary:     Effort: Pulmonary effort is normal.  Abdominal:     General: Abdomen is flat. Bowel sounds are normal.     Palpations: Abdomen is soft.  Musculoskeletal:        General: Normal range of motion.     Cervical back: Normal range of motion.     Right lower leg: No edema.     Left lower leg: No edema.  Skin:    General: Skin is warm and dry.     Capillary Refill: Capillary refill takes less than 2 seconds.     Findings: Ecchymosis present. No petechiae or wound.     Comments: Small scattered ecchymotic patches (no more than 3 cm in diameter) along the forearms bilaterally. No active bleeding or large hematomas present.    Neurological:     General: No focal deficit present.     Mental Status: He is alert and oriented to person, place, and time.  Psychiatric:        Mood and Affect: Mood normal.        Behavior: Behavior normal.        Thought Content: Thought content normal.        Judgment: Judgment normal.     BP 114/66   Pulse 67   Ht 5\' 5"  (1.651 m)   Wt 225 lb (102.1 kg)   SpO2 95%   BMI 37.44 kg/m  Wt Readings from Last 3 Encounters:  08/14/20 225 lb (102.1 kg)  07/29/20 219 lb (99.3 kg)  07/24/20 227 lb 3.2 oz (103.1 kg)    There are no preventive care reminders to display for this patient.  There are no preventive care reminders to display for this  patient.   Lab Results  Component Value Date   TSH 1.61 06/18/2020   Lab Results  Component Value Date   WBC 11.6 (H) 06/18/2020   HGB 15.1 06/18/2020   HCT 44.5 06/18/2020   MCV 85.7 06/18/2020   PLT 272 06/18/2020   Lab Results  Component Value Date   NA 141 06/18/2020   K 3.9 06/18/2020   CO2 29 06/18/2020   GLUCOSE 106 06/18/2020   BUN 20 06/18/2020   CREATININE 1.11 06/18/2020   BILITOT 0.6 06/18/2020   ALKPHOS 37 (L) 09/27/2018   AST 13 06/18/2020   ALT 12 06/18/2020   PROT 7.0 06/18/2020   ALBUMIN 4.0 09/27/2018   CALCIUM 9.4 06/18/2020   GFR 57.15 (L) 09/27/2018   Lab Results  Component Value Date   CHOL 188 07/15/2020   Lab Results  Component Value Date   HDL 87 07/15/2020   Lab Results  Component Value Date   LDLCALC 88 07/15/2020   Lab Results  Component Value Date   TRIG 72 07/15/2020   Lab Results  Component Value Date   CHOLHDL 3 09/27/2018   Lab Results  Component Value Date   HGBA1C 6.0 (H) 07/15/2020       Assessment & Plan:   Problem List Items Addressed This Visit      Cardiovascular and Mediastinum   Senile purpura ([place]) - Primary    Symptoms and presentation consistent with senile purpura related to anticoagulation for chronic atrial fibrillation. No evidence uncontrolled bleeding.  Will obtain CBC and PTT today to  monitor platelets and coagulation to determine dosing is not to high.  Discussed recommendations of wearing long sleeve shirts and applying compression immediately to areas that have been bumped to prevent excessive bruising.  Discussed the importance of monitoring for blood in stool, urine, gums, or evidence of purpura and notifying immediately if symptoms arise.  Follow-up if symptoms worsen or if bleeding issues present.        Relevant Orders   CBC with Differential   PTT       Orma Render, NP

## 2020-08-15 LAB — CBC WITH DIFFERENTIAL/PLATELET
Absolute Monocytes: 886 cells/uL (ref 200–950)
Basophils Absolute: 41 cells/uL (ref 0–200)
Basophils Relative: 0.5 %
Eosinophils Absolute: 156 cells/uL (ref 15–500)
Eosinophils Relative: 1.9 %
HCT: 45.5 % (ref 38.5–50.0)
Hemoglobin: 15.1 g/dL (ref 13.2–17.1)
Lymphs Abs: 1648 cells/uL (ref 850–3900)
MCH: 28.8 pg (ref 27.0–33.0)
MCHC: 33.2 g/dL (ref 32.0–36.0)
MCV: 86.7 fL (ref 80.0–100.0)
MPV: 10.3 fL (ref 7.5–12.5)
Monocytes Relative: 10.8 %
Neutro Abs: 5469 cells/uL (ref 1500–7800)
Neutrophils Relative %: 66.7 %
Platelets: 205 10*3/uL (ref 140–400)
RBC: 5.25 10*6/uL (ref 4.20–5.80)
RDW: 14 % (ref 11.0–15.0)
Total Lymphocyte: 20.1 %
WBC: 8.2 10*3/uL (ref 3.8–10.8)

## 2020-08-15 LAB — APTT: aPTT: 32 s (ref 23–32)

## 2020-08-15 NOTE — Progress Notes (Signed)
[name],   Your labs came back great! No signs that your platelet count is low or that you are on too much blood thinner. Keep monitoring for signs of bleeding and use compression when you bump into things to help reduce the bruising. Let me know if you have any questions.  Good luck on starting your new job! SaraBeth

## 2020-08-18 ENCOUNTER — Ambulatory Visit (INDEPENDENT_AMBULATORY_CARE_PROVIDER_SITE_OTHER): Payer: Medicare HMO | Admitting: Family Medicine

## 2020-08-18 ENCOUNTER — Encounter (INDEPENDENT_AMBULATORY_CARE_PROVIDER_SITE_OTHER): Payer: Self-pay | Admitting: Family Medicine

## 2020-08-18 ENCOUNTER — Telehealth (INDEPENDENT_AMBULATORY_CARE_PROVIDER_SITE_OTHER): Payer: Self-pay

## 2020-08-18 ENCOUNTER — Other Ambulatory Visit: Payer: Self-pay

## 2020-08-18 VITALS — BP 138/82 | HR 67 | Temp 97.4°F | Ht 65.0 in | Wt 217.0 lb

## 2020-08-18 DIAGNOSIS — I4891 Unspecified atrial fibrillation: Secondary | ICD-10-CM

## 2020-08-18 DIAGNOSIS — R4589 Other symptoms and signs involving emotional state: Secondary | ICD-10-CM | POA: Diagnosis not present

## 2020-08-18 DIAGNOSIS — Z6836 Body mass index (BMI) 36.0-36.9, adult: Secondary | ICD-10-CM

## 2020-08-18 DIAGNOSIS — I10 Essential (primary) hypertension: Secondary | ICD-10-CM | POA: Diagnosis not present

## 2020-08-18 NOTE — Telephone Encounter (Signed)
Pt called asking about medication he was instructed to take at night time.   Spoke with Dr Raliegh Scarlet,, medication that needs to be taken at night is atorvastatin.   Call to patient, explained which medication that needs to be taken at night, atorvastatin, pt verbalized understanding, no further questions or needs, call ended.

## 2020-08-19 NOTE — Progress Notes (Signed)
Chief Complaint:   OBESITY Glenn Ortega is here to discuss his progress with his obesity treatment plan along with follow-up of his obesity related diagnoses. Glenn Ortega is on the Category 1 Plan and states he is following his eating plan approximately 98% of the time. Glenn Ortega states he is walking for 20 minutes 4 times per week.  Today's visit was #: 3 Starting weight: 227 lbs Starting date: 07/15/2020 Today's weight: 217 lbs Today's date: 08/18/2020 Total lbs lost to date: 10 lbs Total lbs lost since last in-office visit: 2 lbs Total weight loss percentage to date: -4.41%  Interim History: Glenn Ortega says that for the past 2 days he was away on vacation to the mountains with his son and his grandson.  He ate out and had a couple of less desirable choices.  Otherwise, he ate salads with lean meat, etc.  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  He is looking forward to this as with the loss of his wife, it can get lonely at home.    Assessment/Plan:   1. Essential hypertension He is taking losartan.  Denies lows/dizziness/lightheadedness.  Not checking blood pressure at home regularly, but has upper arm cuff.  Plan:  Blood pressure at goal today.  Rate controlled.  Encouraged home blood pressure monitoring every 2-3 days and as needed if developing any symptoms of hypotension, which we reviewed.  BP Readings from Last 3 Encounters:  08/18/20 138/82  08/14/20 114/66  07/29/20 118/75   2. Atrial fibrillation, unspecified type (Glenn Ortega) Glenn Ortega had a recent visit with his PCP for bruising on his arms ever since he started Eliquis.  He has been seen by Cardiology for A-fib in the past.  Labs within normal limits.    Plan:  Labs within normal limits at PCP.  Precautions for falling/injury discussed with him since being on anticoagulation.  Check blood pressure more frequently and ensure he is taking all medications as prescribed.  All questions answered.  3.  Depressed mood After 60 years of marriage, his wife passed away.  He says he can be sad when at home alone, but working or keeping himself busy helps.  Denies depression or interfering with life, etc.  Denies SI.  Plan:  Increase activity to more frequently - 5-7 days per week at 20 minutes per day.  Contact Hospice for support groups and we will continue to closely follow.  4. Class 2 severe obesity with serious comorbidity and body mass index (BMI) of 36.0 to 36.9 in adult, unspecified obesity type [facility]) Glenn Ortega is currently in the action stage of change. As such, his goal is to continue with weight loss efforts. He has agreed to the Category 1 Plan.   Exercise goals: For substantial health benefits, adults should do at least 150 minutes (2 hours and 30 minutes) a week of moderate-intensity, or 75 minutes (1 hour and 15 minutes) a week of vigorous-intensity aerobic physical activity, or an equivalent combination of moderate- and vigorous-intensity aerobic activity. Aerobic activity should be performed in episodes of at least 10 minutes, and preferably, it should be spread throughout the week. Increase to 150 minutes per week, especially since he will be working 8 hours per day, 3 days per week in the future.  Needs to build stamina!  Behavioral modification strategies: meal planning and cooking strategies, keeping healthy foods in the home (pasta alternatives discussed with him as well as Lavish bread and healthy low carb wraps), better snacking  choices and planning for success.  Jamarrion has agreed to follow-up with our clinic in 2-3 weeks. He was informed of the importance of frequent follow-up visits to maximize his success with intensive lifestyle modifications for his multiple health conditions.   Objective:   Blood pressure 138/82, pulse 67, temperature (!) 97.4 F (36.3 C), height 5\' 5"  (1.651 m), weight 217 lb (98.4 kg), SpO2 98 %. Body mass index is 36.11 kg/m.  General: Cooperative,  alert, well developed, in no acute distress. HEENT: Conjunctivae and lids unremarkable. Cardiovascular: Regular rhythm.  Lungs: Normal work of breathing. Neurologic: No focal deficits.   Lab Results  Component Value Date   CREATININE 1.11 06/18/2020   BUN 20 06/18/2020   NA 141 06/18/2020   K 3.9 06/18/2020   CL 104 06/18/2020   CO2 29 06/18/2020   Lab Results  Component Value Date   ALT 12 06/18/2020   AST 13 06/18/2020   ALKPHOS 37 (L) 09/27/2018   BILITOT 0.6 06/18/2020   Lab Results  Component Value Date   HGBA1C 6.0 (H) 07/15/2020   Lab Results  Component Value Date   INSULIN 15.2 07/15/2020   Lab Results  Component Value Date   TSH 1.61 06/18/2020   Lab Results  Component Value Date   CHOL 188 07/15/2020   HDL 87 07/15/2020   LDLCALC 88 07/15/2020   TRIG 72 07/15/2020   CHOLHDL 3 09/27/2018   Lab Results  Component Value Date   WBC 8.2 08/14/2020   HGB 15.1 08/14/2020   HCT 45.5 08/14/2020   MCV 86.7 08/14/2020   PLT 205 08/14/2020   Attestation Statements:   Reviewed by clinician on day of visit: allergies, medications, problem list, medical history, surgical history, family history, social history, and previous encounter notes.  Time spent on visit including pre-visit chart review and post-visit care and charting was 31 minutes.   I, Water quality scientist, CMA, am acting as Location manager for [employer], DO.  I have reviewed the above documentation for accuracy and completeness, and I agree with the above. Marjory Sneddon, D.O.  The [place] was signed into law in 2016 which includes the topic of electronic health records.  This provides immediate access to information in MyChart.  This includes consultation notes, operative notes, office notes, lab results and pathology reports.  If you have any questions about what you read please let us know at your next visit so we can discuss your concerns and take corrective action if need be.   We are right here with you.

## 2020-08-29 ENCOUNTER — Encounter (INDEPENDENT_AMBULATORY_CARE_PROVIDER_SITE_OTHER): Payer: Self-pay | Admitting: Family Medicine

## 2020-09-01 ENCOUNTER — Ambulatory Visit (INDEPENDENT_AMBULATORY_CARE_PROVIDER_SITE_OTHER): Payer: Medicare HMO | Admitting: Family Medicine

## 2020-09-01 NOTE — Telephone Encounter (Signed)
Dr Opalski, please advise  

## 2020-09-03 ENCOUNTER — Ambulatory Visit (INDEPENDENT_AMBULATORY_CARE_PROVIDER_SITE_OTHER): Payer: Medicare HMO | Admitting: Family Medicine

## 2020-09-08 ENCOUNTER — Ambulatory Visit (INDEPENDENT_AMBULATORY_CARE_PROVIDER_SITE_OTHER): Payer: Medicare HMO | Admitting: Family Medicine

## 2020-09-08 ENCOUNTER — Encounter (INDEPENDENT_AMBULATORY_CARE_PROVIDER_SITE_OTHER): Payer: Self-pay | Admitting: Family Medicine

## 2020-09-08 ENCOUNTER — Other Ambulatory Visit: Payer: Self-pay

## 2020-09-08 VITALS — BP 119/69 | HR 78 | Temp 97.4°F | Ht 65.0 in | Wt 215.0 lb

## 2020-09-08 DIAGNOSIS — Z6835 Body mass index (BMI) 35.0-35.9, adult: Secondary | ICD-10-CM | POA: Diagnosis not present

## 2020-09-08 DIAGNOSIS — E559 Vitamin D deficiency, unspecified: Secondary | ICD-10-CM | POA: Diagnosis not present

## 2020-09-08 DIAGNOSIS — R4589 Other symptoms and signs involving emotional state: Secondary | ICD-10-CM

## 2020-09-08 MED ORDER — VITAMIN D (ERGOCALCIFEROL) 1.25 MG (50000 UNIT) PO CAPS: 50000.0000 [IU] | ORAL_CAPSULE | ORAL | 0 refills | Status: DC

## 2020-09-15 NOTE — Progress Notes (Signed)
Chief Complaint:   OBESITY Glenn Ortega is here to discuss his progress with his obesity treatment plan along with follow-up of his obesity related diagnoses. Shade is on the Category 1 Plan and states he is following his eating plan approximately 98% of the time. 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  Today's visit was #: 4 Starting weight: 227 lbs Starting date: 07/15/2020 Today's weight: 215 lbs Today's date: 09/08/2020 Total lbs lost to date: 12 lbs Total lbs lost since last in-office visit: 2 lbs Total weight loss percentage to date: -5.29%  Interim History: [name] has questions regarding [name]. He has no problems with meal plan. He is working 7:30 AM to 3:30 PM and doesn't eat much during that time (working at [employer]). He is walking much more with working now. He walks all day long. [name] also tells me that he ran out of money this week and cannot afford food/meat.  Assessment/Plan:   1. Vitamin D deficiency Terri's Vitamin D level was 16.3 on 07/15/2020. He is currently taking prescription vitamin D 50,000 IU each week. He denies nausea, vomiting or muscle weakness.  Plan: Refill Vit D for 1 month.Low Vitamin D level contributes to fatigue and are associated with obesity, breast, and colon cancer. He agrees to continue to take prescription Vitamin D @50 ,000 IU every week and will follow-up for routine testing of Vitamin D, at least 2-3 times per year to avoid over-replacement.  Refill- Vitamin D, Ergocalciferol, (DRISDOL) 1.25 MG (50000 UNIT) CAPS capsule; Take 1 capsule (50,000 Units total) by mouth every 7 (seven) days.  Dispense: 4 capsule; Refill: 0  2. Depressed mood [name] recently lost his wife and tells me this is the first tine in 60 years he will be without her for the holidays. He says at times he is sad but states it's not bad and he doesn't need medication. Elgin denies  suicidal ideations.  Plan: We will continue to closely monitor [name] for signs and symptoms of major depression. He will continue walking daily.  3. Class 2 severe obesity with serious comorbidity and body mass index (BMI) of 35.0 to 35.9 in adult, unspecified obesity type [facility]) [name] is currently in the action stage of change. As such, his goal is to continue with weight loss efforts. He has agreed to the Category 1 Plan.   Exercise goals: As is  Behavioral modification strategies: holiday eating strategies , celebration eating strategies and planning for success.  [name] has agreed to follow-up with our clinic in 2-3 weeks. He was informed of the importance of frequent follow-up visits to maximize his success with intensive lifestyle modifications for his multiple health conditions.   Objective:   Blood pressure 119/69, pulse 78, temperature (!) 97.4 F (36.3 C), height 5\' 5"  (1.651 m), weight 215 lb (97.5 kg), SpO2 98 %. Body mass index is 35.78 kg/m.  General: Cooperative, alert, well developed, in no acute distress. HEENT: Conjunctivae and lids unremarkable. Cardiovascular: Regular rhythm.  Lungs: Normal work of breathing. Neurologic: No focal deficits.   Lab Results  Component Value Date   CREATININE 1.11 06/18/2020   BUN 20 06/18/2020   NA 141 06/18/2020   K 3.9 06/18/2020   CL 104 06/18/2020   CO2 29 06/18/2020   Lab Results  Component Value Date   ALT 12 06/18/2020   AST 13 06/18/2020   ALKPHOS 37 (L) 09/27/2018   BILITOT  0.6 06/18/2020   Lab Results  Component Value Date   HGBA1C 6.0 (H) 07/15/2020   Lab Results  Component Value Date   INSULIN 15.2 07/15/2020   Lab Results  Component Value Date   TSH 1.61 06/18/2020   Lab Results  Component Value Date   CHOL 188 07/15/2020   HDL 87 07/15/2020   LDLCALC 88 07/15/2020   TRIG 72 07/15/2020   CHOLHDL 3 09/27/2018   Lab Results  Component Value Date   WBC 8.2 08/14/2020   HGB 15.1 08/14/2020    HCT 45.5 08/14/2020   MCV 86.7 08/14/2020   PLT 205 08/14/2020    Obesity Behavioral Intervention:   Approximately 15 minutes were spent on the discussion below.  ASK: We discussed the diagnosis of obesity with Glenn Ortega today and [name] agreed to give [place] permission to discuss obesity behavioral modification therapy today.  ASSESS: Glenn Ortega has the diagnosis of obesity and his BMI today is 35.8. Glenn Ortega is in the action stage of change.   ADVISE: [name] was educated on the multiple health risks of obesity as well as the benefit of weight loss to improve his health. He was advised of the need for long term treatment and the importance of lifestyle modifications to improve his current health and to decrease his risk of future health problems.  AGREE: Multiple dietary modification options and treatment options were discussed and Glenn Ortega agreed to follow the recommendations documented in the above note.  ARRANGE: Glenn Ortega was educated on the importance of frequent visits to treat obesity as outlined per CMS and USPSTF guidelines and agreed to schedule his next follow up appointment today.  Attestation Statements:   Reviewed by clinician on day of visit: allergies, medications, problem list, medical history, surgical history, family history, social history, and previous encounter notes.  Coral Ceo, am acting as Location manager for [employer], DO.  I have reviewed the above documentation for accuracy and completeness, and I agree with the above. Marjory Sneddon, D.O.  The [place] was signed into law in 2016 which includes the topic of electronic health records.  This provides immediate access to information in MyChart.  This includes consultation notes, operative notes, office notes, lab results and pathology reports.  If you have any questions about what you read please let us know at your next visit so we can discuss your concerns and take corrective action if  need be.  We are right here with you.

## 2020-09-16 ENCOUNTER — Ambulatory Visit (INDEPENDENT_AMBULATORY_CARE_PROVIDER_SITE_OTHER): Payer: Medicare HMO | Admitting: Family Medicine

## 2020-09-17 DIAGNOSIS — I739 Peripheral vascular disease, unspecified: Secondary | ICD-10-CM | POA: Diagnosis not present

## 2020-09-17 DIAGNOSIS — B351 Tinea unguium: Secondary | ICD-10-CM | POA: Diagnosis not present

## 2020-09-22 ENCOUNTER — Ambulatory Visit (INDEPENDENT_AMBULATORY_CARE_PROVIDER_SITE_OTHER): Payer: Medicare HMO | Admitting: Family Medicine

## 2020-09-22 ENCOUNTER — Encounter (INDEPENDENT_AMBULATORY_CARE_PROVIDER_SITE_OTHER): Payer: Self-pay

## 2020-09-27 ENCOUNTER — Encounter: Payer: Self-pay | Admitting: Adult Health

## 2020-10-14 ENCOUNTER — Other Ambulatory Visit: Payer: Self-pay

## 2020-10-14 ENCOUNTER — Ambulatory Visit: Payer: Medicare HMO | Admitting: Family Medicine

## 2020-10-14 ENCOUNTER — Ambulatory Visit: Payer: Self-pay

## 2020-10-14 VITALS — BP 130/79 | Ht 66.0 in | Wt 213.0 lb

## 2020-10-14 DIAGNOSIS — G5601 Carpal tunnel syndrome, right upper limb: Secondary | ICD-10-CM

## 2020-10-14 DIAGNOSIS — G5603 Carpal tunnel syndrome, bilateral upper limbs: Secondary | ICD-10-CM

## 2020-10-14 MED ORDER — METHYLPREDNISOLONE ACETATE 40 MG/ML IJ SUSP
40.0000 mg | Freq: Once | INTRAMUSCULAR | Status: AC
  Administered 2020-10-14: 40 mg via INTRA_ARTICULAR

## 2020-10-14 NOTE — Patient Instructions (Signed)
Nice to meet you Please try the brace at night  Please try the exercises   Please send me a message in MyChart with any questions or updates.  Please see me back in 4 weeks or as needed if better.   --Dr. Jordan Likes

## 2020-10-14 NOTE — Progress Notes (Signed)
Glenn Ortega - 86 y.o. male MRN [id number]  Date of birth: Feb 12, 1934  SUBJECTIVE:  Including CC & ROS.  No chief complaint on file.   Glenn Ortega is a 86 y.o. male that is presenting with acute worsening of his altered sensation of the right hand.  Has been ongoing for the past few weeks.  Reports he has had similar symptoms in the past.  He has received steroid injections that have helped his sensation changes.  No injury or inciting event.   Review of Systems See HPI   HISTORY: Past Medical, Surgical, Social, and Family History Reviewed & Updated per EMR.   Pertinent Historical Findings include:  Past Medical History:  Diagnosis Date  . Atrial fibrillation (HCC)   . Back pain   . GERD (gastroesophageal reflux disease)   . Hyperlipidemia   . Hypertension    EKG and chest x ray 8/12 EPIC/states had stress test 2 yrs ago- doesnt remember where- not in EPIC  . Spinal stenosis    lumbar  . Thyroid trouble     Past Surgical History:  Procedure Laterality Date  . COLONOSCOPY    . CYSTOSCOPY  12/30/2011   Procedure: CYSTOSCOPY;  Surgeon: Martina Sinner, MD;  Location: [facility];  Service: Urology;  Laterality: N/A;  . EYE SURGERY Bilateral 06/18/2017   cataract sx with lens implant  . HERNIA REPAIR  2013  . HERNIA REPAIR Right    age  36  . LUMBAR LAMINECTOMY/DECOMPRESSION MICRODISCECTOMY N/A 11/09/2013   Procedure: Lumbar Laminectomy/Decompression, Microdiscectomy Lumbar Three-Four, Four-Five, with Coflex;  Surgeon: Reinaldo Meeker, MD;  Location[facility];  Service: Neurosurgery;  Laterality: N/A;  Lumbar Laminectomy/Decompression, Microdiscectomy Lumbar Three-Four, Four-Five, with Coflex  . PAROTIDECTOMY Left 11/17/2015   Procedure: LEFT PAROTIDECTOMY;  Surgeon: Serena Colonel, MD;  Location: [facility];  Service: ENT;  Laterality: Left;  . PROSTATE SURGERY  2013  . SALIVARY GLAND SURGERY    . TONSILLECTOMY    . trigger thumb  1980   right    Family  History  Problem Relation Age of Onset  . Hypertension Other   . Cancer Mother        bone?  . Myasthenia gravis Father   . Obesity Father   . Heart disease Brother     Social History   Socioeconomic History  . Marital status: Widowed    Spouse name: Lupita Leash  . Number of children: Not on file  . Years of education: Not on file  . Highest education level: Not on file  Occupational History  . Occupation: Retired  Tobacco Use  . Smoking status: Never Smoker  . Smokeless tobacco: Never Used  . Tobacco comment: per patient he never smoke. 11/25/2014 /rc  Vaping Use  . Vaping Use: Never used  Substance and Sexual Activity  . Alcohol use: Yes    Alcohol/week: 2.0 standard drinks    Types: 2 Standard drinks or equivalent per week    Comment: socially- occ beer  . Drug use: No  . Sexual activity: Not Currently  Other Topics Concern  . Not on file  Social History Narrative   Works 3 nights a week at [employer].    Previously worked as a Production designer, theatre/television/film for [employer].   2 caffeinated drinks per day. Does not exercise he says secondary to back problems.  Lives in a one story home.     Has 2 children.  Education: some college. He is an  is in [facility] from [place]   Wife passed from cancer in 2021.    Social Determinants of Health   Financial Resource Strain: Not on file  Food Insecurity: Not on file  Transportation Needs: Not on file  Physical Activity: Not on file  Stress: Not on file  Social Connections: Not on file  Intimate Partner Violence: Not on file     PHYSICAL EXAM:  VS: BP 130/79   Ht 5\' 6"  (1.676 m)   Wt 213 lb (96.6 kg)   BMI 34.38 kg/m  Physical Exam Gen: NAD, alert, cooperative with exam, well-appearing MSK:  Right hand: No signs of atrophy. Normal strength resistance. Normal pincer grasp. Neurovascularly intact  Limited ultrasound: Right wrist:  Flattened appearance of the median nerve. Measures to be normal.   Summary: findings  suggest flattening of the medial nerve.   Ultrasound and interpretation by , MD   Aspiration/Injection Procedure Note Glenn Ortega Aug 04, 1934  Procedure: Injection Indications: right carpal tunnel syndrome   Procedure Details Consent: Risks of procedure as well as the alternatives and risks of each were explained to the (patient/caregiver).  Consent for procedure obtained. Time Out: Verified patient identification, verified procedure, site/side was marked, verified correct patient position, special equipment/implants available, medications/allergies/relevent history reviewed, required imaging and test results available.  Performed.  The area was cleaned with iodine and alcohol swabs.    The right carpal tunnel was injected using 1 cc's of 40 mg Depo-Medrol and 1.5 cc's of 0.25% bupivacaine with a 25 1 1/2" needle.  Ultrasound was used. Images were obtained in long views showing the injection.     A sterile dressing was applied.  Patient did tolerate procedure well.    ASSESSMENT & PLAN:   Carpal tunnel syndrome, bilateral Acute worsening of this carpal tunnel syndrome.  - counseled on home exercise therapy and supportive care  - injection  - could consider PT.

## 2020-10-14 NOTE — Addendum Note (Signed)
Addended by: Annita Brod on: 10/14/2020 06:37 PM   Modules accepted: Orders

## 2020-10-14 NOTE — Assessment & Plan Note (Signed)
Acute worsening of this carpal tunnel syndrome.  - counseled on home exercise therapy and supportive care  - injection  - could consider PT.

## 2020-10-22 ENCOUNTER — Other Ambulatory Visit: Payer: Self-pay | Admitting: Family Medicine

## 2020-10-22 DIAGNOSIS — I4891 Unspecified atrial fibrillation: Secondary | ICD-10-CM

## 2020-11-04 ENCOUNTER — Encounter: Payer: Self-pay | Admitting: Adult Health

## 2020-11-04 ENCOUNTER — Other Ambulatory Visit: Payer: Self-pay

## 2020-11-04 ENCOUNTER — Telehealth (INDEPENDENT_AMBULATORY_CARE_PROVIDER_SITE_OTHER): Payer: PPO | Admitting: Adult Health

## 2020-11-04 VITALS — BP 128/80 | HR 93 | Ht 66.0 in | Wt 215.5 lb

## 2020-11-04 DIAGNOSIS — M545 Low back pain, unspecified: Secondary | ICD-10-CM | POA: Diagnosis not present

## 2020-11-04 DIAGNOSIS — E782 Mixed hyperlipidemia: Secondary | ICD-10-CM | POA: Diagnosis not present

## 2020-11-04 DIAGNOSIS — I10 Essential (primary) hypertension: Secondary | ICD-10-CM

## 2020-11-04 DIAGNOSIS — E7849 Other hyperlipidemia: Secondary | ICD-10-CM | POA: Diagnosis not present

## 2020-11-04 DIAGNOSIS — I4891 Unspecified atrial fibrillation: Secondary | ICD-10-CM

## 2020-11-04 DIAGNOSIS — E039 Hypothyroidism, unspecified: Secondary | ICD-10-CM

## 2020-11-04 NOTE — Progress Notes (Signed)
Subjective:    Patient ID: Glenn Ortega, male    DOB: Sep 07, 1934, 87 y.o.   MRN: [id number]  HPI 87 year old male who  has a past medical history of Atrial fibrillation ([place]), Back pain, GERD (gastroesophageal reflux disease), Hyperlipidemia, Hypertension, Spinal stenosis, and Thyroid trouble.  He is here to reestablish care. He reports that he left this office as after his wife died he thought is would be too hard to come back since she was seen here.  He did have a CPE in September 2021.   Since he was last seen here he has been diagnosed with Atrial Fibrillation back in 06/2020. He was placed on Eliquis 5 mg BID and is followed by cardiology.   Echo in 9/21 showed: "Left ventricular ejection fraction, by estimation, is 55 to 60%. The  left ventricle has normal function. The left ventricle has no regional  wall motion abnormalities. Left ventricular diastolic parameters are  indeterminate.  2. Right ventricular systolic function is normal. The right ventricular  size is normal. There is normal pulmonary artery systolic pressure.  3. Left atrial size was mildly dilated.  4. The mitral valve is normal in structure. Trivial mitral valve  regurgitation. No evidence of mitral stenosis.  5. The aortic valve is tricuspid. Aortic valve regurgitation is not  visualized. No aortic stenosis is present.  6. The inferior vena cava is normal in size with greater than 50%  respiratory variability, suggesting right atrial pressure of 3 mmHg. "  He is currently prescribed Lipitor 10 mg for history of hyperlipidemia   Lab Results  Component Value Date   CHOL 188 07/15/2020   HDL 87 07/15/2020   LDLCALC 88 07/15/2020   TRIG 72 07/15/2020   CHOLHDL 3 09/27/2018   He also takes Synthroid  50 mcg for hypothyroidism  Lab Results  Component Value Date   TSH 1.61 06/18/2020   Essential Hypertension - takes Cozaar 25 mg. He denies dizziness, lightheadedness or blurred vision.  BP Readings  from Last 3 Encounters:  11/04/20 128/80  10/14/20 130/79  09/08/20 119/69   He does report that he is doing well since his wife died.  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  He is doing this 5 days a week to keep himself busy.  When he goes home he does feel lonely, his children got him puppy to keep him company.  His biggest complaint today is that of low back pain, reports that "my back feels weak and it is almost like, unsteady when I am walking".  He denies falls.  Feels as though some of this could be due to the heavy lifting that he is doing at his job. He is using a TENS until at home which provides him relief.    His last lumbar spine xray in 10/2018 showed   No fracture or spondylolisthesis is noted. Status post surgical fusion of L3-4 and L4-5 posterior interspinous spaces. Anterior osteophyte formation is noted at multiple levels of the lumbar spine. Moderate degenerative disc disease is noted at L4-5.  Review of Systems See HPI   Past Medical History:  Diagnosis Date  . Atrial fibrillation ([place])   . Back pain   . GERD (gastroesophageal reflux disease)   . Hyperlipidemia   . Hypertension    EKG and chest x ray 8/12 EPIC/states had stress test 2 yrs ago- doesnt remember where- not in EPIC  . Spinal stenosis    lumbar  .  Thyroid trouble     Social History   Socioeconomic History  . Marital status: Widowed    Spouse name: Glenn Ortega  . Number of children: Not on file  . Years of education: Not on file  . Highest education level: Not on file  Occupational History  . Occupation: Retired  Tobacco Use  . Smoking status: Never Smoker  . Smokeless tobacco: Never Used  . Tobacco comment: per patient he never smoke. 11/25/2014 /rc  Vaping Use  . Vaping Use: Never used  Substance and Sexual Activity  . Alcohol use: Yes    Alcohol/week: 2.0 standard drinks    Types: 2 Standard drinks or equivalent per week    Comment: socially- occ beer  . Drug use: No  .  Sexual activity: Not Currently  Other Topics Concern  . Not on file  Social History Narrative   Works 3 nights a week at [employer].    Previously worked as a Freight forwarder for [employer].   2 caffeinated drinks per day. Does not exercise he says secondary to back problems.  Lives in a one story home.     Has 2 children.  Education: some college. He is an is in [facility] from [place]   Wife passed from [place] in 2021.    Social Determinants of Health   Financial Resource Strain: Not on file  Food Insecurity: Not on file  Transportation Needs: Not on file  Physical Activity: Not on file  Stress: Not on file  Social Connections: Not on file  Intimate Partner Violence: Not on file    Past Surgical History:  Procedure Laterality Date  . COLONOSCOPY    . CYSTOSCOPY  12/30/2011   Procedure: CYSTOSCOPY;  Surgeon: Reece Packer, MD;  [facility];  Service: Urology;  Laterality: N/A;  . EYE SURGERY Bilateral 06/18/2017   cataract sx with lens implant  . HERNIA REPAIR  2013  . HERNIA REPAIR Right    age  23  . LUMBAR LAMINECTOMY/DECOMPRESSION MICRODISCECTOMY N/A 11/09/2013   Procedure: Lumbar Laminectomy/Decompression, Microdiscectomy Lumbar Three-Four, Four-Five, with Coflex;  Surgeon: Faythe Ghee, MD;  Location[facility];  Service: Neurosurgery;  Laterality: N/A;  Lumbar Laminectomy/Decompression, Microdiscectomy Lumbar Three-Four, Four-Five, with Coflex  . PAROTIDECTOMY Left 11/17/2015   Procedure: LEFT PAROTIDECTOMY;  Surgeon: Izora Gala, MD;  Location: [facility];  Service: ENT;  Laterality: Left;  . PROSTATE SURGERY  2013  . SALIVARY GLAND SURGERY    . TONSILLECTOMY    . trigger thumb  1980   right    Family History  Problem Relation Age of Onset  . Hypertension Other   . Cancer Mother        bone?  . Myasthenia gravis Father   . Obesity Father   . Heart disease Brother     Allergies  Allergen Reactions  . Penicillins Swelling     CHILDHOOD ALLERGY Has patient had a PCN reaction causing immediate rash, facial/tongue/throat swelling, SOB or lightheadedness with hypotension: Yes Has patient had a PCN reaction causing severe rash involving mucus membranes or skin necrosis: No Has patient had a PCN reaction that required hospitalization: No Has patient had a PCN reaction occurring within the last 10 years: No If all of the above answers are "NO", then may proceed with Cephalosporin use.   . Gabapentin     "MADE ME FEEL CRAZY"    Current Outpatient Medications on File Prior to Visit  Medication  Sig Dispense Refill  . apixaban (ELIQUIS) 5 MG TABS tablet Take 1 tablet (5 mg total) by mouth 2 (two) times daily. 180 tablet 3  . atorvastatin (LIPITOR) 10 MG tablet TAKE ONE TABLET BY MOUTH DAILY 90 tablet 2  . levothyroxine (SYNTHROID) 50 MCG tablet TAKE ONE TABLET BY MOUTH DAILY 90 tablet 2  . losartan (COZAAR) 25 MG tablet TAKE 1 TABLET BY MOUTH DAILY *DUE FOR YEARLY PHYSICAL 90 tablet 0  . Multiple Vitamins-Minerals (CENTRUM SILVER 50+MEN) TABS 1-2 po qd    . Multiple Vitamins-Minerals (PRESERVISION AREDS 2 PO) Take by mouth.    . Vitamin D, Ergocalciferol, (DRISDOL) 1.25 MG (50000 UNIT) CAPS capsule Take 1 capsule (50,000 Units total) by mouth every 7 (seven) days. 4 capsule 0   No current facility-administered medications on file prior to visit.    BP 128/80   Pulse 93   Ht 5\' 6"  (1.676 m)   Wt 215 lb 8 oz (97.8 kg)   SpO2 95%   BMI 34.78 kg/m       Objective:   Physical Exam Vitals and nursing note reviewed.  Constitutional:      Appearance: Normal appearance.  Cardiovascular:     Rate and Rhythm: Normal rate. Rhythm irregularly irregular.     Pulses: Normal pulses.     Heart sounds: Normal heart sounds.  Pulmonary:     Effort: Pulmonary effort is normal.     Breath sounds: Normal breath sounds.  Musculoskeletal:        General: Normal range of motion.       Back:     Comments: Paraspinal  muscle tenderness noted bilaterally. No spinal tenderness noted   Skin:    General: Skin is warm and dry.     Capillary Refill: Capillary refill takes less than 2 seconds.  Neurological:     General: No focal deficit present.     Mental Status: He is alert and oriented to person, place, and time.  Psychiatric:        Mood and Affect: Mood normal.        Behavior: Behavior normal.        Thought Content: Thought content normal.        Judgment: Judgment normal.       Assessment & Plan:  1. Essential hypertension - no change in medications   2. Atrial fibrillation, unspecified type ([place]) - Follow up with Cardiology as directed.  - Continue with Eliquis 5 mg BID - Rate controlled.   3. Other hyperlipidemia - Continue with statin   4. Hypothyroidism, unspecified type - Continue with synthroid   6. Low back pain, non-specific -Seems to be more muscular.  He can continue to use his TENS unit.  Heating pad may be beneficial.  He is going to talk to sports medicine about possibly getting a back brace for that.  [name], NP

## 2020-11-05 DIAGNOSIS — L570 Actinic keratosis: Secondary | ICD-10-CM | POA: Diagnosis not present

## 2020-11-05 DIAGNOSIS — L821 Other seborrheic keratosis: Secondary | ICD-10-CM | POA: Diagnosis not present

## 2020-11-05 DIAGNOSIS — D1801 Hemangioma of skin and subcutaneous tissue: Secondary | ICD-10-CM | POA: Diagnosis not present

## 2020-11-05 DIAGNOSIS — D2272 Melanocytic nevi of left lower limb, including hip: Secondary | ICD-10-CM | POA: Diagnosis not present

## 2020-11-05 DIAGNOSIS — L814 Other melanin hyperpigmentation: Secondary | ICD-10-CM | POA: Diagnosis not present

## 2020-11-05 DIAGNOSIS — D225 Melanocytic nevi of trunk: Secondary | ICD-10-CM | POA: Diagnosis not present

## 2020-11-05 DIAGNOSIS — D485 Neoplasm of uncertain behavior of skin: Secondary | ICD-10-CM | POA: Diagnosis not present

## 2020-11-11 ENCOUNTER — Ambulatory Visit: Payer: PPO | Admitting: Family Medicine

## 2020-11-11 ENCOUNTER — Other Ambulatory Visit: Payer: Self-pay | Admitting: Family Medicine

## 2020-11-11 ENCOUNTER — Other Ambulatory Visit: Payer: Self-pay

## 2020-11-11 VITALS — BP 136/76 | Ht 66.0 in | Wt 213.0 lb

## 2020-11-11 DIAGNOSIS — M545 Low back pain, unspecified: Secondary | ICD-10-CM | POA: Diagnosis not present

## 2020-11-11 DIAGNOSIS — I4891 Unspecified atrial fibrillation: Secondary | ICD-10-CM

## 2020-11-11 NOTE — Progress Notes (Signed)
[name] - 87 y.o. male MRN [id number]  Date of birth: 1933-11-04  SUBJECTIVE:  Including CC & ROS.  No chief complaint on file.   Glenn Ortega is a 87 y.o. male that is presenting with low back pain.  The pain has been occurring over the past few weeks.  He has been lifting more at work.  Denies any specific injury or inciting event.  Seems to be worse at the end of the day and he has tilting forward.   Review of Systems See HPI   HISTORY: Past Medical, Surgical, Social, and Family History Reviewed & Updated per EMR.   Pertinent Historical Findings include:  Past Medical History:  Diagnosis Date  . Atrial fibrillation ([place])   . Back pain   . GERD (gastroesophageal reflux disease)   . Hyperlipidemia   . Hypertension    EKG and chest x ray 8/12 EPIC/states had stress test 2 yrs ago- doesnt remember where- not in EPIC  . Spinal stenosis    lumbar  . Thyroid trouble     Past Surgical History:  Procedure Laterality Date  . COLONOSCOPY    . CYSTOSCOPY  12/30/2011   Procedure: CYSTOSCOPY;  Surgeon: Glenn Packer, MD;  [facility];  Service: Urology;  Laterality: N/A;  . EYE SURGERY Bilateral 06/18/2017   cataract sx with lens implant  . HERNIA REPAIR  2013  . HERNIA REPAIR Right    age  25  . LUMBAR LAMINECTOMY/DECOMPRESSION MICRODISCECTOMY N/A 11/09/2013   Procedure: Lumbar Laminectomy/Decompression, Microdiscectomy Lumbar Three-Four, Four-Five, with Coflex;  Surgeon: Glenn Ghee, MD;  [facility];  Service: Neurosurgery;  Laterality: N/A;  Lumbar Laminectomy/Decompression, Microdiscectomy Lumbar Three-Four, Four-Five, with Coflex  . PAROTIDECTOMY Left 11/17/2015   Procedure: LEFT PAROTIDECTOMY;  Surgeon: Glenn Gala, MD;  Location: [facility];  Service: ENT;  Laterality: Left;  . PROSTATE SURGERY  2013  . SALIVARY GLAND SURGERY    . TONSILLECTOMY    . trigger thumb  1980   right    Family History  Problem Relation Age of Onset   . Hypertension Other   . Cancer Mother        bone?  . Myasthenia gravis Father   . Obesity Father   . Heart disease Brother     Social History   Socioeconomic History  . Marital status: Widowed    Spouse name: [name]  . Number of children: Not on file  . Years of education: Not on file  . Highest education level: Not on file  Occupational History  . Occupation: Retired  Tobacco Use  . Smoking status: Never Smoker  . Smokeless tobacco: Never Used  . Tobacco comment: per patient he never smoke. 11/25/2014 /rc  Vaping Use  . Vaping Use: Never used  Substance and Sexual Activity  . Alcohol use: Yes    Alcohol/week: 2.0 standard drinks    Types: 2 Standard drinks or equivalent per week    Comment: socially- occ beer  . Drug use: No  . Sexual activity: Not Currently  Other Topics Concern  . Not on file  Social History Narrative   Works 3 nights a week at [employer].    Previously worked as a Freight forwarder for [employer].   2 caffeinated drinks per day. Does not exercise he says secondary to back problems.  Lives in a one story home.     Has 2 children.  Education: some college. [place] is an [place]   Wife passed from [place] in 2021.    Social Determinants of Health   Financial Resource Strain: Not on file  Food Insecurity: Not on file  Transportation Needs: Not on file  Physical Activity: Not on file  Stress: Not on file  Social Connections: Not on file  Intimate Partner Violence: Not on file     PHYSICAL EXAM:  VS: BP 136/76   Ht 5\' 6"  (1.676 m)   Wt 213 lb (96.6 kg)   BMI 34.38 kg/m  Physical Exam Gen: NAD, alert, cooperative with exam, well-appearing MSK:  Left: Sitting in a forward flexed position. Normal strength resistance with hip flexion and extension. Negative straight leg raise. Neurovascular intact     ASSESSMENT & PLAN:   Acute bilateral low back pain without sciatica Likely related to degenerative changes as  well as fatigue acutely today. -Counseled on home exercise therapy and supportive care. -Back brace. -Referral to physical therapy. -Could consider imaging if needed.

## 2020-11-11 NOTE — Patient Instructions (Signed)
Good to see you Please try heat  Please try the exercises  Physical therapy should call   Please send me a message in MyChart with any questions or updates.  Please see me back in 4-6 weeks.   --Dr. Raeford Razor

## 2020-11-12 DIAGNOSIS — M545 Low back pain, unspecified: Secondary | ICD-10-CM | POA: Insufficient documentation

## 2020-11-12 NOTE — Assessment & Plan Note (Signed)
Likely related to degenerative changes as well as fatigue acutely today. -Counseled on home exercise therapy and supportive care. -Back brace. -Referral to physical therapy. -Could consider imaging if needed.

## 2020-11-19 DIAGNOSIS — M6281 Muscle weakness (generalized): Secondary | ICD-10-CM | POA: Diagnosis not present

## 2020-11-19 DIAGNOSIS — R262 Difficulty in walking, not elsewhere classified: Secondary | ICD-10-CM | POA: Diagnosis not present

## 2020-11-19 DIAGNOSIS — M545 Low back pain, unspecified: Secondary | ICD-10-CM | POA: Diagnosis not present

## 2020-11-19 DIAGNOSIS — M2569 Stiffness of other specified joint, not elsewhere classified: Secondary | ICD-10-CM | POA: Diagnosis not present

## 2020-11-21 ENCOUNTER — Other Ambulatory Visit: Payer: Self-pay | Admitting: Adult Health

## 2020-11-21 DIAGNOSIS — E785 Hyperlipidemia, unspecified: Secondary | ICD-10-CM

## 2020-11-25 ENCOUNTER — Other Ambulatory Visit: Payer: Self-pay | Admitting: Family Medicine

## 2020-11-26 ENCOUNTER — Other Ambulatory Visit: Payer: Self-pay | Admitting: Adult Health

## 2020-11-26 MED ORDER — LOSARTAN POTASSIUM 25 MG PO TABS: ORAL_TABLET | ORAL | 2 refills | Status: DC

## 2020-12-01 ENCOUNTER — Telehealth: Payer: Self-pay | Admitting: Adult Health

## 2020-12-01 NOTE — Telephone Encounter (Signed)
Patient is having back pain and he also called last week about vertigo and has never received a call back.  I asked him if her wanted to schedule an appointment in office or virtual and he said that he drives all the time and it's hard for him to make an appointment. He just wants [place] or his nurse to call him back.

## 2020-12-02 NOTE — Telephone Encounter (Signed)
I have scheduled the pt to see Tommi Rumps on a date that suits the patient's schedule.  He c/o lower back pain (mostly in the evenings)  and some vertigo.  He has been seen by sports medicine but does not want to go back to Dr. Doristine Locks office.  He stated he thought the office was "dirty and there were no machines."  I advised the pt to use OTC pain relievers and a heating pad for any discomfort in the meantime.   Will forward to [facility] as [place].

## 2020-12-09 ENCOUNTER — Ambulatory Visit (INDEPENDENT_AMBULATORY_CARE_PROVIDER_SITE_OTHER): Payer: PPO

## 2020-12-09 ENCOUNTER — Encounter: Payer: Self-pay | Admitting: Adult Health

## 2020-12-09 ENCOUNTER — Ambulatory Visit (INDEPENDENT_AMBULATORY_CARE_PROVIDER_SITE_OTHER): Payer: PPO | Admitting: Adult Health

## 2020-12-09 ENCOUNTER — Other Ambulatory Visit: Payer: Self-pay

## 2020-12-09 VITALS — BP 146/90 | Temp 97.9°F | Wt 209.0 lb

## 2020-12-09 DIAGNOSIS — M545 Low back pain, unspecified: Secondary | ICD-10-CM

## 2020-12-09 MED ORDER — CYCLOBENZAPRINE HCL 10 MG PO TABS: 10.0000 mg | ORAL_TABLET | Freq: Every day | ORAL | 0 refills | Status: DC

## 2020-12-09 NOTE — Progress Notes (Signed)
Subjective:    Patient ID: Glenn Ortega, male    DOB: [birth year], 87 y.o.   MRN: [id number]  HPI 87 year old male who  has a past medical history of Atrial fibrillation ([place]), Back pain, GERD (gastroesophageal reflux disease), Hyperlipidemia, Hypertension, Spinal stenosis, and Thyroid trouble.  He presents to the office today for follow-up regarding low back pain.  Since I last saw him he has been seen by sports medicine who prescribed him a back brace and sent him to physical therapy.  He does report that the back brace helps to some degree when he is working.  He went to [facility] [address] and reports that he went once and stopped going because "it was dirty, dark, and the only had one machine in the entire building".  He is still interested in physical therapy to help with his low back pain but would like to be referred elsewhere.  He reports that he has difficulty getting out of bed in the morning due to pain "all across my lower back" and pain is worse towards the end of the day which causes him to tilt forward.  Review of Systems See HPI   Past Medical History:  Diagnosis Date  . Atrial fibrillation ([place])   . Back pain   . GERD (gastroesophageal reflux disease)   . Hyperlipidemia   . Hypertension    EKG and chest x ray 8/12 [facility]/states had stress test 2 yrs ago- doesnt remember where- not in [facility]  . Spinal stenosis    lumbar  . Thyroid trouble     Social History   Socioeconomic History  . Marital status: Widowed    Spouse name: [name]  . Number of children: Not on file  . Years of education: Not on file  . Highest education level: Not on file  Occupational History  . Occupation: Retired  Tobacco Use  . Smoking status: Never Smoker  . Smokeless tobacco: Never Used  . Tobacco comment: per patient he never smoke. 11/25/2014 /rc  Vaping Use  . Vaping Use: Never used  Substance and Sexual Activity  . Alcohol use: Yes    Alcohol/week: 2.0  standard drinks    Types: 2 Standard drinks or equivalent per week    Comment: socially- occ beer  . Drug use: No  . Sexual activity: Not Currently  Other Topics Concern  . Not on file  Social History Narrative   Works 3 nights a week at [employer].    Previously worked as a Freight forwarder for [employer].   2 caffeinated drinks per day. Does not exercise he says secondary to back problems.  Lives in a one story home.     Has 2 children.  Education: some college. He is an is in [facility] from [place]   Wife passed from [place] in 2021.    Social Determinants of Health   Financial Resource Strain: Not on file  Food Insecurity: Not on file  Transportation Needs: Not on file  Physical Activity: Not on file  Stress: Not on file  Social Connections: Not on file  Intimate Partner Violence: Not on file    Past Surgical History:  Procedure Laterality Date  . COLONOSCOPY    . CYSTOSCOPY  12/30/2011   Procedure: CYSTOSCOPY;  Surgeon: Reece Packer, MD;  Location[facility];  Service: Urology;  Laterality: N/A;  . EYE SURGERY Bilateral 06/18/2017   cataract sx with lens implant  .  HERNIA REPAIR  2013  . HERNIA REPAIR Right    age  42  . LUMBAR LAMINECTOMY/DECOMPRESSION MICRODISCECTOMY N/A 11/09/2013   Procedure: Lumbar Laminectomy/Decompression, Microdiscectomy Lumbar Three-Four, Four-Five, with Coflex;  Surgeon: Faythe Ghee, MD;  [facility];  Service: Neurosurgery;  Laterality: N/A;  Lumbar Laminectomy/Decompression, Microdiscectomy Lumbar Three-Four, Four-Five, with Coflex  . PAROTIDECTOMY Left 11/17/2015   Procedure: LEFT PAROTIDECTOMY;  Surgeon: Izora Gala, MD;  Location: [facility];  Service: ENT;  Laterality: Left;  . PROSTATE SURGERY  2013  . SALIVARY GLAND SURGERY    . TONSILLECTOMY    . trigger thumb  1980   right    Family History  Problem Relation Age of Onset  . Hypertension Other   . Cancer Mother        bone?  . Myasthenia  gravis Father   . Obesity Father   . Heart disease Brother     Allergies  Allergen Reactions  . Penicillins Swelling    CHILDHOOD ALLERGY Has patient had a PCN reaction causing immediate rash, facial/tongue/throat swelling, SOB or lightheadedness with hypotension: Yes Has patient had a PCN reaction causing severe rash involving mucus membranes or skin necrosis: No Has patient had a PCN reaction that required hospitalization: No Has patient had a PCN reaction occurring within the last 10 years: No If all of the above answers are "NO", then may proceed with Cephalosporin use.   . Gabapentin     "MADE ME FEEL CRAZY"    Current Outpatient Medications on File Prior to Visit  Medication Sig Dispense Refill  . atorvastatin (LIPITOR) 10 MG tablet TAKE ONE TABLET BY MOUTH DAILY 90 tablet 3  . ELIQUIS 5 MG TABS tablet TAKE ONE TABLET BY MOUTH TWICE A DAY 60 tablet 3  . levothyroxine (SYNTHROID) 50 MCG tablet TAKE ONE TABLET BY MOUTH DAILY 90 tablet 2  . losartan (COZAAR) 25 MG tablet TAKE 1 TABLET BY MOUTH DAILY 90 tablet 2  . Multiple Vitamins-Minerals (CENTRUM SILVER 50+MEN) TABS 1-2 po qd    . Multiple Vitamins-Minerals (PRESERVISION AREDS 2 PO) Take by mouth.    . Vitamin D, Ergocalciferol, (DRISDOL) 1.25 MG (50000 UNIT) CAPS capsule Take 1 capsule (50,000 Units total) by mouth every 7 (seven) days. 4 capsule 0   No current facility-administered medications on file prior to visit.    BP (!) 146/90   Temp 97.9 F (36.6 C)   Wt 209 lb (94.8 kg)   BMI 33.73 kg/m       Objective:   Physical Exam Vitals and nursing note reviewed.  Constitutional:      Appearance: Normal appearance.  Musculoskeletal:        General: No tenderness, deformity or signs of injury. Normal range of motion.     Comments: Slight forward flexion with walking.  Skin:    General: Skin is warm and dry.  Neurological:     General: No focal deficit present.     Mental Status: He is alert and oriented to  person, place, and time.  Psychiatric:        Mood and Affect: Mood normal.        Behavior: Behavior normal.        Thought Content: Thought content normal.        Judgment: Judgment normal.       Assessment & Plan:  1. Lumbar spine pain -Likely partially due to degenerative changes in his lower lumbar spine as well as possible muscular  aspect.  Will refer to physical therapy [facility].  Provide a short course of Flexeril that he can take in the evening.  He does understand this medication may make him sleepy.  We will order an x-ray of the lumbar spine today.  Consider referral to neurosurgery or further imaging with an MRI - DG Lumbar Spine Complete; Future - Ambulatory referral to Physical Therapy - cyclobenzaprine (FLEXERIL) 10 MG tablet; Take 1 tablet (10 mg total) by mouth at bedtime.  Dispense: 20 tablet; Refill: 0   [name], NP

## 2020-12-10 ENCOUNTER — Telehealth: Payer: Self-pay | Admitting: Adult Health

## 2020-12-10 NOTE — Telephone Encounter (Signed)
Pt is calling in to let Glenn Ortega know who the medication (cyclobenzaprine (FLEXERIL)10 MG) that he was given on yesterday it helped about 80% for his back and did not have a problem with sleeping but still a problem with getting up in the morning and the back pain is very severe.  Pt would like to have a call back.

## 2020-12-11 NOTE — Telephone Encounter (Signed)
Please have him continue with Flexeril as needed, hopefully it continues to improve. His xray shows progression of L2 height loss - this is likely from arthritis. Once he gets set up for physical therapy I think it is going to be good for him

## 2020-12-11 NOTE — Telephone Encounter (Signed)
Spoke to the pt and informed him of the below message from [facility].  He will call back if needed.

## 2020-12-15 ENCOUNTER — Telehealth: Payer: Self-pay | Admitting: Adult Health

## 2020-12-15 NOTE — Telephone Encounter (Signed)
Pt is requesting a different muscle relaxer. the patient says the last medication for muscle relaxer is causing him to have drive mouth please call in to a different pharmacy

## 2020-12-16 ENCOUNTER — Other Ambulatory Visit: Payer: Self-pay | Admitting: Adult Health

## 2020-12-16 ENCOUNTER — Ambulatory Visit: Payer: PPO | Admitting: Family Medicine

## 2020-12-16 MED ORDER — TIZANIDINE HCL 2 MG PO CAPS: 2.0000 mg | ORAL_CAPSULE | Freq: Every evening | ORAL | 0 refills | Status: DC

## 2020-12-16 NOTE — Telephone Encounter (Signed)
Pt is referring to Flexeril. Please advise.

## 2020-12-17 DIAGNOSIS — I739 Peripheral vascular disease, unspecified: Secondary | ICD-10-CM | POA: Diagnosis not present

## 2020-12-17 DIAGNOSIS — B351 Tinea unguium: Secondary | ICD-10-CM | POA: Diagnosis not present

## 2020-12-23 ENCOUNTER — Ambulatory Visit (INDEPENDENT_AMBULATORY_CARE_PROVIDER_SITE_OTHER): Payer: PPO | Admitting: Family Medicine

## 2020-12-23 ENCOUNTER — Other Ambulatory Visit: Payer: Self-pay

## 2020-12-23 ENCOUNTER — Ambulatory Visit: Payer: Self-pay

## 2020-12-23 ENCOUNTER — Encounter: Payer: Self-pay | Admitting: Family Medicine

## 2020-12-23 DIAGNOSIS — G5603 Carpal tunnel syndrome, bilateral upper limbs: Secondary | ICD-10-CM | POA: Diagnosis not present

## 2020-12-23 MED ORDER — METHYLPREDNISOLONE ACETATE 40 MG/ML IJ SUSP
40.0000 mg | Freq: Once | INTRAMUSCULAR | Status: AC
Start: 2020-12-23 — End: 2020-12-23
  Administered 2020-12-23: 40 mg via INTRAMUSCULAR

## 2020-12-23 NOTE — Patient Instructions (Signed)
Good to see you Please try the splints at night.  Please try the exercises   Please send me a message in MyChart with any questions or updates.  Please see me back in 4 weeks.   --Dr. Raeford Razor

## 2020-12-23 NOTE — Addendum Note (Signed)
Addended by: Cresenciano Lick on: 12/23/2020 05:03 PM   Modules accepted: Orders

## 2020-12-23 NOTE — Progress Notes (Signed)
Glenn Ortega - 87 y.o. male MRN [id number]  Date of birth: 01-18-1934  SUBJECTIVE:  Including CC & ROS.  No chief complaint on file.   Glenn Ortega is a 87 y.o. male that is presenting with acute worsening of his left hand pain.  Has a history of carpal tunnel syndrome.  This feels similar to.  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  He also has to lift several different objects at work.  No injury.    Review of Systems See HPI   HISTORY: Past Medical, Surgical, Social, and Family History Reviewed & Updated per EMR.   Pertinent Historical Findings include:  Past Medical History:  Diagnosis Date  . Atrial fibrillation ([place])   . Back pain   . GERD (gastroesophageal reflux disease)   . Hyperlipidemia   . Hypertension    EKG and chest x ray 8/12 [facility]/states had stress test 2 yrs ago- doesnt remember where- not in EP[facility]  . Spinal stenosis    lumbar  . Thyroid trouble     Past Surgical History:  Procedure Laterality Date  . COLONOSCOPY    . CYSTOSCOPY  12/30/2011   Procedure: CYSTOSCOPY;  Surgeon: Reece Packer, MD;  [facility];  Service: Urology;  Laterality: N/A;  . EYE SURGERY Bilateral 06/18/2017   cataract sx with lens implant  . HERNIA REPAIR  2013  . HERNIA REPAIR Right    age  32  . LUMBAR LAMINECTOMY/DECOMPRESSION MICRODISCECTOMY N/A 11/09/2013   Procedure: Lumbar Laminectomy/Decompression, Microdiscectomy Lumbar Three-Four, Four-Five, with Coflex;  Surgeon: Faythe Ghee, MD;  [facility];  Service: Neurosurgery;  Laterality: N/A;  Lumbar Laminectomy/Decompression, Microdiscectomy Lumbar Three-Four, Four-Five, with Coflex  . PAROTIDECTOMY Left 11/17/2015   Procedure: LEFT PAROTIDECTOMY;  Surgeon: Izora Gala, MD;  Location: [facility];  Service: ENT;  Laterality: Left;  . PROSTATE SURGERY  2013  . SALIVARY GLAND SURGERY    . TONSILLECTOMY    . trigger thumb  1980   right    Family History  Problem Relation Age of  Onset  . Hypertension Other   . Cancer Mother        bone?  . Myasthenia gravis Father   . Obesity Father   . Heart disease Brother     Social History   Socioeconomic History  . Marital status: Widowed    Spouse name: [name]  . Number of children: Not on file  . Years of education: Not on file  . Highest education level: Not on file  Occupational History  . Occupation: Retired  Tobacco Use  . Smoking status: Never Smoker  . Smokeless tobacco: Never Used  . Tobacco comment: per patient he never smoke. 11/25/2014 /rc  Vaping Use  . Vaping Use: Never used  Substance and Sexual Activity  . Alcohol use: Yes    Alcohol/week: 2.0 standard drinks    Types: 2 Standard drinks or equivalent per week    Comment: socially- occ beer  . Drug use: No  . Sexual activity: Not Currently  Other Topics Concern  . Not on file  Social History Narrative   Works 3 nights a week at [employer].    Previously worked as a Freight forwarder for [employer].   2 caffeinated drinks per day. Does not exercise he says secondary to back problems.  Lives in a one story home.     Has 2 children.  Education: some college. He is an  is in [facility] from [place]   Wife passed from [place] in 2021.    Social Determinants of Health   Financial Resource Strain: Not on file  Food Insecurity: Not on file  Transportation Needs: Not on file  Physical Activity: Not on file  Stress: Not on file  Social Connections: Not on file  Intimate Partner Violence: Not on file     PHYSICAL EXAM:  VS: BP 120/76 (BP Location: Right Arm, Patient Position: Sitting, Cuff Size: Large)   Ht 5\' 6"  (1.676 m)   Wt 204 lb (92.5 kg)   BMI 32.93 kg/m  Physical Exam Gen: NAD, alert, cooperative with exam, well-appearing MSK:  Left hand pain Normal strength resistance. No signs of atrophy. Positive Tinel's at the wrist. Neurovascular intact  Limited ultrasound: Left wrist:  Median nerve appears to be flattening as  well as having hypoechoic change.  It is measured at 0.14 cm  Summary: Findings are suggestive of tunnel syndrome.  Ultrasound and interpretation by Clearance Coots, MD   Aspiration/Injection Procedure Note Glenn Ortega 03/13/1934  Procedure: Injection Indications: Left carpal tunnel syndrome  Procedure Details Consent: Risks of procedure as well as the alternatives and risks of each were explained to the (patient/caregiver).  Consent for procedure obtained. Time Out: Verified patient identification, verified procedure, site/side was marked, verified correct patient position, special equipment/implants available, medications/allergies/relevent history reviewed, required imaging and test results available.  Performed.  The area was cleaned with iodine and alcohol swabs.    The left carpal tunnel was injected using 1 cc's of 40 mg Depo-Medrol and 1 cc's of 0.25% bupivacaine with a 25 1 1/2" needle.  Ultrasound was used. Images were obtained in long views showing the injection.     A sterile dressing was applied.  Patient did tolerate procedure well.    ASSESSMENT & PLAN:   Carpal tunnel syndrome, bilateral Acute on chronic in nature.  Seems to be exacerbated by workplace driving and lifting.  Has changes on ultrasound. -Counseled on home exercise therapy and supportive care - injection  - provided right and left wrist brace -Could consider referral to surgery.

## 2020-12-23 NOTE — Assessment & Plan Note (Addendum)
Acute on chronic in nature.  Seems to be exacerbated by workplace driving and lifting.  Has changes on ultrasound. -Counseled on home exercise therapy and supportive care - injection  - provided right and left wrist brace -Could consider referral to surgery.

## 2020-12-24 ENCOUNTER — Ambulatory Visit: Payer: PPO | Admitting: Physical Therapy

## 2020-12-24 ENCOUNTER — Encounter: Payer: Self-pay | Admitting: Physical Therapy

## 2020-12-24 DIAGNOSIS — M545 Low back pain, unspecified: Secondary | ICD-10-CM

## 2020-12-24 DIAGNOSIS — G8929 Other chronic pain: Secondary | ICD-10-CM

## 2020-12-24 NOTE — Patient Instructions (Signed)
Access Code: [email or web address] URL: [email or web address] Date: 12/24/2020 Prepared by: Lyndee Hensen  Exercises Hook Lying Single Knee to Chest Stretch with Towel - 2 x daily - 3 reps - 30 hold Supine Posterior Pelvic Tilt - 2 x daily - 1 sets - 10 reps Supine Lower Trunk Rotation - 2 x daily - 10 reps - 5 hold Seated Hamstring Stretch - 2 x daily - 3 reps - 30 hold

## 2020-12-27 ENCOUNTER — Encounter: Payer: Self-pay | Admitting: Physical Therapy

## 2020-12-27 NOTE — Therapy (Signed)
[facility] [address] [place], [place], [address] Phone: [phone]   Fax:  [phone]  Physical Therapy Evaluation  Patient Details  Name: Glenn Ortega MRN: [id number] Date of Birth: 1934-07-09 Referring Provider (PT): [name]   Encounter Date: 12/24/2020   PT End of Session - 12/27/20 0907    Visit Number 1    Number of Visits 12    Date for PT Re-Evaluation 02/04/21    Authorization Type HTA    PT Start Time 1600    PT Stop Time 1694    PT Time Calculation (min) 43 min    Activity Tolerance Patient tolerated treatment well    Behavior During Therapy [facility] for tasks assessed/performed           Past Medical History:  Diagnosis Date  . Atrial fibrillation ([place])   . Back pain   . GERD (gastroesophageal reflux disease)   . Hyperlipidemia   . Hypertension    EKG and chest x ray 8/12 [facility]/states had stress test 2 yrs ago- doesnt remember where- not in EPIC  . Spinal stenosis    lumbar  . Thyroid trouble     Past Surgical History:  Procedure Laterality Date  . COLONOSCOPY    . CYSTOSCOPY  12/30/2011   Procedure: CYSTOSCOPY;  Surgeon: Reece Packer, MD;  [facility];  Service: Urology;  Laterality: N/A;  . EYE SURGERY Bilateral 06/18/2017   cataract sx with lens implant  . HERNIA REPAIR  2013  . HERNIA REPAIR Right    age  87  . LUMBAR LAMINECTOMY/DECOMPRESSION MICRODISCECTOMY N/A 11/09/2013   Procedure: Lumbar Laminectomy/Decompression, Microdiscectomy Lumbar Three-Four, Four-Five, with Coflex;  Surgeon: Faythe Ghee, MD;  [facility];  Service: Neurosurgery;  Laterality: N/A;  Lumbar Laminectomy/Decompression, Microdiscectomy Lumbar Three-Four, Four-Five, with Coflex  . PAROTIDECTOMY Left 11/17/2015   Procedure: LEFT PAROTIDECTOMY;  Surgeon: Izora Gala, MD;  Location: [facility];  Service: ENT;  Laterality: Left;  . PROSTATE SURGERY  2013  . SALIVARY GLAND SURGERY    .  TONSILLECTOMY    . trigger thumb  1980   right    There were no vitals filed for this visit.    Subjective Assessment - 12/27/20 0906    Subjective Pt states increased pain in back for a few months.. Notes pain at night when turning in bed, as well as in AM. States almost unable to get up out of the bed. Also states stooped posture. States minimal/no pain during the day. 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 Would like to exercise more but states doesnt have time.    Limitations Sitting;Lifting;Standing;Walking;House hold activities    Patient Stated Goals decreased pain, decreased pain in AM and turning in bed.    Currently in Pain? Yes    Pain Score 5     Pain Location Back    Pain Orientation Left;Right;Lower    Pain Descriptors / Indicators Aching    Pain Type Acute pain    Pain Onset More than a month ago    Pain Frequency Intermittent    Aggravating Factors  first thing in AM, turning in bed, with prolonged standing, walking              [facility] PT Assessment - 12/27/20 0001      Assessment   Medical Diagnosis low back pain    Referring Provider (PT) [name]    Prior Therapy no  Precautions   Precautions None      Balance Screen   Has the patient fallen in the past 6 months Yes    How many times? 2    Has the patient had a decrease in activity level because of a fear of falling?  No    Is the patient reluctant to leave their home because of a fear of falling?  No      Prior Function   Level of Independence Independent      Cognition   Overall Cognitive Status Within Functional Limits for tasks assessed      Posture/Postural Control   Posture Comments poor seated posture with PPT, and poor standing posture with increased trunk flexion      AROM   Overall AROM Comments hips: mild limitation for all motions, R knee mild limitation for flexoin    Lumbar Flexion mod limitation    Lumbar Extension significant limitation    Lumbar -  Right Side Bend mod/sig limitatation    Lumbar - Left Side Bend mod limitation      Strength   Overall Strength Comments Hips: 4-/5,      Palpation   Palpation comment Sig tightness/limitation for bil HS .      Special Tests   Other special tests Neg SLR                      Objective measurements completed on examination: See above findings.       [facility] Adult PT Treatment/Exercise - 12/27/20 0001      Exercises   Exercises Lumbar      Lumbar Exercises: Stretches   Active Hamstring Stretch 3 reps;30 seconds    Single Knee to Chest Stretch 3 reps;30 seconds    Lower Trunk Rotation 5 reps;10 seconds    Pelvic Tilt 10 reps                  PT Education - 12/27/20 0907    Education Details PT POC, Exam findings, HEP    Person(s) Educated Patient    Methods Demonstration;Explanation;Tactile cues;Verbal cues;Handout    Comprehension Verbalized understanding;Returned demonstration;Verbal cues required;Need further instruction;Tactile cues required            PT Short Term Goals - 12/27/20 0913      PT SHORT TERM GOAL #1   Title Pt to be indepednent with initial HEP    Time 2    Period Weeks    Status New    Target Date 01/07/21             PT Long Term Goals - 12/27/20 0914      PT LONG TERM GOAL #1   Title Pt to be independent with final HEP    Time 6    Period Weeks    Status New    Target Date 02/04/21      PT LONG TERM GOAL #2   Title Pt to report decreased pain in low back to 0-2/10 in AM, as well as with work duties.    Time 6    Period Weeks    Status New    Target Date 02/04/21      PT LONG TERM GOAL #3   Title Pt to demo optimal seated posture for driving/work duties.    Time 6    Period Weeks    Status New    Target Date 02/04/21      PT LONG TERM  GOAL #4   Title Pt to demo optimal mechanics for squat/lift/bending, to improve pain and safety with work duties.    Time 6    Period Weeks    Status New    Target  Date 02/04/21                  Plan - 12/27/20 6546    Clinical Impression Statement Pt presents with primary complaint of increased pain in low back. Pt with most pain in AM with initial movment and standing, as well as at end of the day. Pt still working full time with lots of driving and lifting. Pt with stiffness in lumbar spine, with decreased ROM, as well as weakness in core and hips. He has poor seated posture and poor mechanics for bend/lift. Pt to benefit from skilled PT to improve ROM, strength,body mechanics and pain.    Examination-Activity Limitations Locomotion Level;Bend;Transfers;Squat;Stand;Lift    Examination-Participation Restrictions Cleaning;Occupation;Community Activity;Driving;Yard Work;Shop    Stability/Clinical Decision Making Stable/Uncomplicated    Clinical Decision Making Low    Rehab Potential Good    PT Frequency 2x / week    PT Duration 6 weeks    PT Treatment/Interventions ADLs/Self Care Home Management;Canalith Repostioning;Cryotherapy;Electrical Stimulation;Iontophoresis 4mg /ml Dexamethasone;Moist Heat;Ultrasound;Traction;DME Instruction;Gait training;Stair training;Functional mobility training;Therapeutic activities;Therapeutic exercise;Balance training;Neuromuscular re-education;Manual techniques;Orthotic Fit/Training;Patient/family education;Manual lymph drainage;Passive range of motion;Dry needling;Energy conservation;Splinting;Joint Manipulations;Spinal Manipulations    Consulted and Agree with Plan of Care Patient           Patient will benefit from skilled therapeutic intervention in order to improve the following deficits and impairments:  Decreased range of motion,Increased muscle spasms,Decreased activity tolerance,Pain,Hypomobility,Impaired flexibility,Improper body mechanics,Postural dysfunction,Decreased strength,Decreased mobility  Visit Diagnosis: Chronic bilateral low back pain without sciatica     Problem List Patient Active  Problem List   Diagnosis Date Noted  . Acute bilateral low back pain without sciatica 11/12/2020  . Essential hypertension 08/18/2020  . Depressed mood, not depression 08/18/2020  . Senile purpura ([place]) 08/14/2020  . Vitamin D deficiency 07/29/2020  . Hypothyroidism 07/15/2020  . Hyperglycemia 07/15/2020  . BMI 38.0-38.9,adult 06/18/2020  . Bilateral lower extremity edema 06/18/2020  . Atrial fibrillation ([place]) 06/17/2020  . BPPV (benign paroxysmal positional vertigo) 06/17/2020  . Primary osteoarthritis of left knee 09/17/2019  . Hyperlipidemia 09/27/2018  . 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 Parotid mass 11/17/2015  . Carpal tunnel syndrome, bilateral 08/18/2015  . Unspecified hereditary and idiopathic peripheral neuropathy 06/18/2014  . Lumbar spinal stenosis 11/09/2013  . ESOPHAGEAL REFLUX 04/10/2010  . HYPERTENSION, PULMONARY 04/07/2010  . UNSPECIFIED HEARING LOSS 08/07/2008  . HYPERTENSION, BENIGN 08/07/2008  . BPH (benign prostatic hyperplasia) 08/07/2008  . SCIATICA 08/07/2008    Lyndee Hensen, PT, DPT 9:28 AM  12/27/20    [facility] [address] [place], [place], [address] Phone: [phone]   Fax:  [phone]  Name: Glenn Ortega MRN: [id number] Date of Birth: [birth year]

## 2020-12-27 NOTE — Addendum Note (Signed)
Addended by: Lyndee Hensen on: 12/27/2020 09:29 AM   Modules accepted: Orders

## 2021-01-13 ENCOUNTER — Encounter: Payer: Self-pay | Admitting: Physical Therapy

## 2021-01-13 ENCOUNTER — Ambulatory Visit (INDEPENDENT_AMBULATORY_CARE_PROVIDER_SITE_OTHER): Payer: PPO | Admitting: Physical Therapy

## 2021-01-13 ENCOUNTER — Other Ambulatory Visit: Payer: Self-pay

## 2021-01-13 DIAGNOSIS — G8929 Other chronic pain: Secondary | ICD-10-CM | POA: Diagnosis not present

## 2021-01-13 DIAGNOSIS — M545 Low back pain, unspecified: Secondary | ICD-10-CM | POA: Diagnosis not present

## 2021-01-13 NOTE — Patient Instructions (Signed)
Access Code: [email or web address] URL: [email or web address] Date: 01/13/2021 Prepared by: Lyndee Hensen  Exercises Supine Single Knee to Chest - 2 x daily - 3 reps - 30 hold Supine Posterior Pelvic Tilt - 2 x daily - 2 sets - 10 reps - 3 hold Cat Cow - 1 x daily - 2 sets - 10 reps - 5 hold Supine Figure 4 Piriformis Stretch - 2 x daily - 3 reps - 30 hold Supine March - 1 x daily - 2 sets - 10 reps Supine Bridge - 1 x daily - 1-2 sets - 10 reps

## 2021-01-14 ENCOUNTER — Encounter: Payer: Self-pay | Admitting: Physical Therapy

## 2021-01-14 NOTE — Therapy (Signed)
[place], [place], [address] Phone: [phone]   Fax:  [phone]  Physical Therapy Treatment  Patient Details  Name: Glenn Ortega MRN: [id number] Date of Birth: 11-21-1933 Referring Provider (PT): [name]   Encounter Date: 01/13/2021   PT End of Session - 01/14/21 0823    Visit Number 2    Number of Visits 12    Date for PT Re-Evaluation 02/04/21    Authorization Type HTA    PT Start Time 1602    PT Stop Time 1640    PT Time Calculation (min) 38 min    Activity Tolerance Patient tolerated treatment well    Behavior During Therapy [facility] for tasks assessed/performed           Past Medical History:  Diagnosis Date  . Atrial fibrillation ([place])   . Back pain   . GERD (gastroesophageal reflux disease)   . Hyperlipidemia   . Hypertension    EKG and chest x ray 8/12 EPIC/states had stress test 2 yrs ago- doesnt remember where- not in EPIC  . Spinal stenosis    lumbar  . Thyroid trouble     Past Surgical History:  Procedure Laterality Date  . COLONOSCOPY    . CYSTOSCOPY  12/30/2011   Procedure: CYSTOSCOPY;  Surgeon: Reece Packer, MD;  [facility];  Service: Urology;  Laterality: N/A;  . EYE SURGERY Bilateral 06/18/2017   cataract sx with lens implant  . HERNIA REPAIR  2013  . HERNIA REPAIR Right    age  87  . LUMBAR LAMINECTOMY/DECOMPRESSION MICRODISCECTOMY N/A 11/09/2013   Procedure: Lumbar Laminectomy/Decompression, Microdiscectomy Lumbar Three-Four, Four-Five, with Coflex;  Surgeon: Faythe Ghee, MD;  [facility];  Service: Neurosurgery;  Laterality: N/A;  Lumbar Laminectomy/Decompression, Microdiscectomy Lumbar Three-Four, Four-Five, with Coflex  . PAROTIDECTOMY Left 11/17/2015   Procedure: LEFT PAROTIDECTOMY;  Surgeon: Izora Gala, MD;  Location: [place];  Service: ENT;  Laterality: Left;  . PROSTATE SURGERY  2013  . SALIVARY GLAND SURGERY    .  TONSILLECTOMY    . trigger thumb  1980   right    There were no vitals filed for this visit.   Subjective Assessment - 01/13/21 1611    Subjective Pt last seen 3/9. States doing very well. Has minimal pain in morning, for a few minutes. Has been doing HEP at night and in Am.    Currently in Pain? Yes    Pain Score 2     Pain Location Back    Pain Orientation Left;Right    Pain Descriptors / Indicators Aching    Pain Type Acute pain    Pain Onset More than a month ago    Pain Frequency Intermittent                             [facility] Adult PT Treatment/Exercise - 01/14/21 0001      Posture/Postural Control   Posture Comments poor seated posture with PPT, and poor standing posture with increased trunk flexion      Exercises   Exercises Lumbar      Lumbar Exercises: Stretches   Active Hamstring Stretch 3 reps;30 seconds    Single Knee to Chest Stretch 3 reps;30 seconds    Lower Trunk Rotation 5 reps;10 seconds    Pelvic Tilt 20 reps    Pelvic Tilt Limitations max verbal and tactile cuing for  correct performance      Lumbar Exercises: Standing   Row 20 reps    Theraband Level (Row) Level 3 (Green)    Other Standing Lumbar Exercises Standing march x 20;      Lumbar Exercises: Seated   Sit to Stand 10 reps    Other Seated Lumbar Exercises pelvic tilts for posture awareness x 15;      Lumbar Exercises: Supine   Bent Knee Raise 20 reps    Bridge 10 reps                  PT Education - 01/14/21 [phone]    Education Details Reviewed, updated HEP    Person(s) Educated Patient    Methods Explanation;Demonstration;Tactile cues;Verbal cues;Handout    Comprehension Verbalized understanding;Returned demonstration;Verbal cues required;Tactile cues required;Need further instruction            PT Short Term Goals - 01/14/21 0823      PT SHORT TERM GOAL #1   Title Pt to be indepednent with initial HEP    Time 2    Period Weeks    Status Achieved     Target Date 01/07/21             PT Long Term Goals - 12/27/20 0914      PT LONG TERM GOAL #1   Title Pt to be independent with final HEP    Time 6    Period Weeks    Status New    Target Date 02/04/21      PT LONG TERM GOAL #2   Title Pt to report decreased pain in low back to 0-2/10 in AM, as well as with work duties.    Time 6    Period Weeks    Status New    Target Date 02/04/21      PT LONG TERM GOAL #3   Title Pt to demo optimal seated posture for driving/work duties.    Time 6    Period Weeks    Status New    Target Date 02/04/21      PT LONG TERM GOAL #4   Title Pt to demo optimal mechanics for squat/lift/bending, to improve pain and safety with work duties.    Time 6    Period Weeks    Status New    Target Date 02/04/21                 Plan - 01/14/21 0824    Clinical Impression Statement Pt making good progress. Doing very well with HEP, and is having reduced pain. Pt with diffiuclty with pelvic tilt motion, and difficulty achieving optimal posture in sitting.Challenged with LE strength and SL strength with activities today.  Pt to benefit from continued strengthening, as he is still working very physical job.    Examination-Activity Limitations Locomotion Level;Bend;Transfers;Squat;Stand;Lift    Examination-Participation Restrictions Cleaning;Occupation;Community Activity;Driving;Yard Work;Shop    Stability/Clinical Decision Making Stable/Uncomplicated    Rehab Potential Good    PT Frequency 2x / week    PT Duration 6 weeks    PT Treatment/Interventions ADLs/Self Care Home Management;Canalith Repostioning;Cryotherapy;Electrical Stimulation;Iontophoresis 4mg /ml Dexamethasone;Moist Heat;Ultrasound;Traction;DME Instruction;Gait training;Stair training;Functional mobility training;Therapeutic activities;Therapeutic exercise;Balance training;Neuromuscular re-education;Manual techniques;Orthotic Fit/Training;Patient/family education;Manual lymph  drainage;Passive range of motion;Dry needling;Energy conservation;Splinting;Joint Manipulations;Spinal Manipulations    Consulted and Agree with Plan of Care Patient           Patient will benefit from skilled therapeutic intervention in order to improve the following deficits and impairments:  Decreased range of motion,Increased muscle spasms,Decreased activity tolerance,Pain,Hypomobility,Impaired flexibility,Improper body mechanics,Postural dysfunction,Decreased strength,Decreased mobility  Visit Diagnosis: Chronic bilateral low back pain without sciatica     Problem List Patient Active Problem List   Diagnosis Date Noted  . Acute bilateral low back pain without sciatica 11/12/2020  . Essential hypertension 08/18/2020  . Depressed mood, not depression 08/18/2020  . Senile purpura ([place]) 08/14/2020  . Vitamin D deficiency 07/29/2020  . Hypothyroidism 07/15/2020  . Hyperglycemia 07/15/2020  . BMI 38.0-38.9,adult 06/18/2020  . Bilateral lower extremity edema 06/18/2020  . Atrial fibrillation ([place]) 06/17/2020  . BPPV (benign paroxysmal positional vertigo) 06/17/2020  . Primary osteoarthritis of left knee 09/17/2019  . Hyperlipidemia 09/27/2018  . 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 Parotid mass 11/17/2015  . Carpal tunnel syndrome, bilateral 08/18/2015  . Unspecified hereditary and idiopathic peripheral neuropathy 06/18/2014  . Lumbar spinal stenosis 11/09/2013  . ESOPHAGEAL REFLUX 04/10/2010  . HYPERTENSION, PULMONARY 04/07/2010  . UNSPECIFIED HEARING LOSS 08/07/2008  . HYPERTENSION, BENIGN 08/07/2008  . BPH (benign prostatic hyperplasia) 08/07/2008  . SCIATICA 08/07/2008    Lyndee Hensen, PT, DPT 8:26 AM  01/14/21    [facility] [place], [place], [address] Phone: [phone]   Fax:  [phone]  Name: TRINI CHRISTIANSEN MRN: [id number] Date of Birth:  06/30/1934

## 2021-01-15 ENCOUNTER — Encounter: Payer: PPO | Admitting: Physical Therapy

## 2021-01-20 ENCOUNTER — Ambulatory Visit (INDEPENDENT_AMBULATORY_CARE_PROVIDER_SITE_OTHER): Payer: PPO | Admitting: Physical Therapy

## 2021-01-20 ENCOUNTER — Encounter: Payer: Self-pay | Admitting: Physical Therapy

## 2021-01-20 ENCOUNTER — Other Ambulatory Visit: Payer: Self-pay

## 2021-01-20 DIAGNOSIS — G8929 Other chronic pain: Secondary | ICD-10-CM | POA: Diagnosis not present

## 2021-01-20 DIAGNOSIS — M545 Low back pain, unspecified: Secondary | ICD-10-CM | POA: Diagnosis not present

## 2021-01-20 NOTE — Patient Instructions (Signed)
Access Code: [email or web address] URL: [email or web address] Date: 01/20/2021 Prepared by: Lyndee Hensen  Exercises Hook Lying Single Knee to Chest Stretch with Towel - 2 x daily - 3 reps - 30 hold Supine Posterior Pelvic Tilt - 2 x daily - 1 sets - 10 reps Supine Lower Trunk Rotation - 2 x daily - 10 reps - 5 hold Seated Hamstring Stretch - 2 x daily - 3 reps - 30 hold Supine Bridge - 1 x daily - 2 sets - 10 reps Standing March with Counter Support - 1 x daily - 2 sets - 10 reps Sit to Stand - 1 x daily - 1 sets - 10 reps

## 2021-01-21 ENCOUNTER — Encounter: Payer: Self-pay | Admitting: Physical Therapy

## 2021-01-21 ENCOUNTER — Ambulatory Visit: Payer: PPO | Admitting: Family Medicine

## 2021-01-21 NOTE — Therapy (Addendum)
[facility] [address] [place], [place], [address] Phone: [phone]   Fax:  [phone]  Physical Therapy Treatment  Patient Details  Name: Glenn Ortega MRN: [id number] Date of Birth: 06/27/1934 Referring Provider (PT): [name]   Encounter Date: 01/20/2021   PT End of Session - 01/21/21 1301     Visit Number 3    Number of Visits 12    Date for PT Re-Evaluation 02/04/21    Authorization Type HTA    PT Start Time 1602    PT Stop Time 1640    PT Time Calculation (min) 38 min    Activity Tolerance Patient tolerated treatment well    Behavior During Therapy [facility] for tasks assessed/performed             Past Medical History:  Diagnosis Date   Atrial fibrillation ([place])    Back pain    GERD (gastroesophageal reflux disease)    Hyperlipidemia    Hypertension    EKG and chest x ray 8/12 EPIC/states had stress test 2 yrs ago- doesnt remember where- not in EPIC   Spinal stenosis    lumbar   Thyroid trouble     Past Surgical History:  Procedure Laterality Date   COLONOSCOPY     CYSTOSCOPY  12/30/2011   Procedure: CYSTOSCOPY;  Surgeon: Reece Packer, MD;  [facility];  Service: Urology;  Laterality: N/A;   EYE SURGERY Bilateral 06/18/2017   cataract sx with lens implant   HERNIA REPAIR  2013   HERNIA REPAIR Right    age  55   LUMBAR LAMINECTOMY/DECOMPRESSION MICRODISCECTOMY N/A 11/09/2013   Procedure: Lumbar Laminectomy/Decompression, Microdiscectomy Lumbar Three-Four, Four-Five, with Coflex;  Surgeon: Faythe Ghee, MD;  [facility];  Service: Neurosurgery;  Laterality: N/A;  Lumbar Laminectomy/Decompression, Microdiscectomy Lumbar Three-Four, Four-Five, with Coflex   PAROTIDECTOMY Left 11/17/2015   Procedure: LEFT PAROTIDECTOMY;  Surgeon: Izora Gala, MD;  Location: [place];  Service: ENT;  Laterality: Left;   PROSTATE SURGERY  2013   SALIVARY GLAND SURGERY     TONSILLECTOMY      trigger thumb  1980   right    There were no vitals filed for this visit.   Subjective Assessment - 01/20/21 1613     Subjective Pt reports reduced pain., and has less trouble getting out of bed in am.    Limitations Sitting;Lifting;Standing;House hold activities    Patient Stated Goals decreased pain, decreased pain in AM and turning in bed.    Currently in Pain? No/denies    Pain Location Back    Pain Orientation Right;Left    Pain Descriptors / Indicators Aching    Pain Type Acute pain    Pain Onset More than a month ago    Pain Frequency Intermittent                               [facility] Adult PT Treatment/Exercise - 01/21/21 0001       Posture/Postural Control   Posture Comments poor seated posture with PPT, and poor standing posture with increased trunk flexion      Exercises   Exercises Lumbar      Lumbar Exercises: Stretches   Active Hamstring Stretch 3 reps;30 seconds    Single Knee to Chest Stretch 3 reps;30 seconds    Pelvic Tilt 20 reps      Lumbar Exercises: Standing  Functional Squats 20 reps    Lifting 10 reps;From 12"    Lifting Weights (lbs) 10    Lifting Limitations with educatoin on form and mechanics.    Row 20 reps    Theraband Level (Row) Level 3 (Green)    Other Standing Lumbar Exercises Standing march x 20 unilateral 7 lb hold    Other Standing Lumbar Exercises Fwd step up 6 in x 10 bil, no UE support      Lumbar Exercises: Seated   Sit to Stand 10 reps      Lumbar Exercises: Supine   Bent Knee Raise 20 reps    Bridge 20 reps    Straight Leg Raise 15 reps                      PT Short Term Goals - 01/14/21 0823       PT SHORT TERM GOAL #1   Title Pt to be indepednent with initial HEP    Time 2    Period Weeks    Status Achieved    Target Date 01/07/21               PT Long Term Goals - 12/27/20 0914       PT LONG TERM GOAL #1   Title Pt to be independent with final HEP    Time 6     Period Weeks    Status New    Target Date 02/04/21      PT LONG TERM GOAL #2   Title Pt to report decreased pain in low back to 0-2/10 in AM, as well as with work duties.    Time 6    Period Weeks    Status New    Target Date 02/04/21      PT LONG TERM GOAL #3   Title Pt to demo optimal seated posture for driving/work duties.    Time 6    Period Weeks    Status New    Target Date 02/04/21      PT LONG TERM GOAL #4   Title Pt to demo optimal mechanics for squat/lift/bending, to improve pain and safety with work duties.    Time 6    Period Weeks    Status New    Target Date 02/04/21                   Plan - 01/21/21 1304     Clinical Impression Statement Pt doing very well with pain. He has been doing HEP. Challenged wtih standing strengthening, stability, and lifting mechanics today. Pt to benefit from continued care for improving these, and for eductaion on HEP. Likely d/c next visit if still having minimal Pain.    Examination-Activity Limitations Locomotion Level;Bend;Transfers;Squat;Stand;Lift    Examination-Participation Restrictions Cleaning;Occupation;Community Activity;Driving;Yard Work;Shop    Stability/Clinical Decision Making Stable/Uncomplicated    Rehab Potential Good    PT Frequency 2x / week    PT Duration 6 weeks    PT Treatment/Interventions ADLs/Self Care Home Management;Canalith Repostioning;Cryotherapy;Electrical Stimulation;Iontophoresis 79m/ml Dexamethasone;Moist Heat;Ultrasound;Traction;DME Instruction;Gait training;Stair training;Functional mobility training;Therapeutic activities;Therapeutic exercise;Balance training;Neuromuscular re-education;Manual techniques;Orthotic Fit/Training;Patient/family education;Manual lymph drainage;Passive range of motion;Dry needling;Energy conservation;Splinting;Joint Manipulations;Spinal Manipulations    Consulted and Agree with Plan of Care Patient             Patient will benefit from skilled therapeutic  intervention in order to improve the following deficits and impairments:  Decreased range of motion,Increased muscle spasms,Decreased activity tolerance,Pain,Hypomobility,Impaired flexibility,Improper body  mechanics,Postural dysfunction,Decreased strength,Decreased mobility  Visit Diagnosis: Chronic bilateral low back pain without sciatica     Problem List Patient Active Problem List   Diagnosis Date Noted   Acute bilateral low back pain without sciatica 11/12/2020   Essential hypertension 08/18/2020   Depressed mood, not depression 08/18/2020   Senile purpura ([place]) 08/14/2020   Vitamin D deficiency 07/29/2020   Hypothyroidism 07/15/2020   Hyperglycemia 07/15/2020   BMI 38.0-38.9,adult 06/18/2020   Bilateral lower extremity edema 06/18/2020   Atrial fibrillation ([place]) 06/17/2020   BPPV (benign paroxysmal positional vertigo) 06/17/2020   Primary osteoarthritis of left knee 09/17/2019   Hyperlipidemia 09/27/2018   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 05/03/2016   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   Parotid mass 11/17/2015   Carpal tunnel syndrome, bilateral 08/18/2015   Unspecified hereditary and idiopathic peripheral neuropathy 06/18/2014   Lumbar spinal stenosis 11/09/2013   ESOPHAGEAL REFLUX 04/10/2010   HYPERTENSION, PULMONARY 04/07/2010   UNSPECIFIED HEARING LOSS 08/07/2008   HYPERTENSION, BENIGN 08/07/2008   BPH (benign prostatic hyperplasia) 08/07/2008   SCIATICA 08/07/2008    Lyndee Hensen, PT, DPT 1:12 PM  01/21/21    [place], [place], [address] Phone: [phone]   Fax:  [phone]  Name: Glenn Ortega MRN: [id number] Date of Birth: 10-23-1933  PHYSICAL THERAPY DISCHARGE SUMMARY  Visits from Start of Care: 2 Plan: Patient agrees to discharge.  Patient goals were partially met. Patient is being discharged due to not returning since last visit     Lyndee Hensen, PT, DPT 11:53 AM   10/15/21

## 2021-01-22 ENCOUNTER — Encounter: Payer: PPO | Admitting: Physical Therapy

## 2021-01-23 ENCOUNTER — Telehealth: Payer: Self-pay | Admitting: Adult Health

## 2021-01-23 NOTE — Telephone Encounter (Signed)
Tried calling patient to  schedule Medicare Annual Wellness Visit (AWV) either virtually or in office.  No answer  Last AWV 10/13/17 please schedule at anytime with [facility] Nurse Health Advisor 1 or 2   This should be a 45 minute visit.

## 2021-01-27 ENCOUNTER — Encounter: Payer: PPO | Admitting: Physical Therapy

## 2021-01-29 ENCOUNTER — Encounter: Payer: PPO | Admitting: Physical Therapy

## 2021-02-10 ENCOUNTER — Telehealth: Payer: Self-pay | Admitting: Interventional Cardiology

## 2021-02-10 NOTE — Telephone Encounter (Signed)
*  STAT* If patient is at the pharmacy, call can be transferred to refill team.   1. Which medications need to be refilled? (please list name of each medication and dose if known) ELIQUIS 5 MG TABS tablet  2. Which pharmacy/location (including street and city if local pharmacy) is medication to be sent to? [place], [place] [address]  3. Do they need a 30 day or 90 day supply? 90 days   Patient calling the office for samples of medication:   1.  What medication and dosage are you requesting samples for? ELIQUIS 5 MG TABS tablet  2.  Are you currently out of this medication? Yes, pt has been out for 1 week  Pt states he ran out of money last week and doesn't receive his SSI payment  for another week, pt has been out of this med for 1 week and is needing samples

## 2021-02-11 ENCOUNTER — Ambulatory Visit (INDEPENDENT_AMBULATORY_CARE_PROVIDER_SITE_OTHER): Payer: PPO | Admitting: Family Medicine

## 2021-02-11 ENCOUNTER — Encounter: Payer: Self-pay | Admitting: Family Medicine

## 2021-02-11 ENCOUNTER — Other Ambulatory Visit: Payer: Self-pay

## 2021-02-11 ENCOUNTER — Ambulatory Visit: Payer: Self-pay

## 2021-02-11 VITALS — BP 128/70 | Ht 66.0 in | Wt 204.0 lb

## 2021-02-11 DIAGNOSIS — G5603 Carpal tunnel syndrome, bilateral upper limbs: Secondary | ICD-10-CM

## 2021-02-11 DIAGNOSIS — M25562 Pain in left knee: Secondary | ICD-10-CM

## 2021-02-11 DIAGNOSIS — M23301 Other meniscus derangements, unspecified lateral meniscus, left knee: Secondary | ICD-10-CM | POA: Diagnosis not present

## 2021-02-11 DIAGNOSIS — M1712 Unilateral primary osteoarthritis, left knee: Secondary | ICD-10-CM | POA: Diagnosis not present

## 2021-02-11 MED ORDER — METHYLPREDNISOLONE ACETATE 40 MG/ML IJ SUSP
40.0000 mg | Freq: Once | INTRAMUSCULAR | Status: AC
  Administered 2021-02-11: 40 mg via INTRA_ARTICULAR

## 2021-02-11 MED ORDER — TRIAMCINOLONE ACETONIDE 40 MG/ML IJ SUSP
40.0000 mg | Freq: Once | INTRAMUSCULAR | Status: AC
Start: 2021-02-11 — End: 2021-02-11
  Administered 2021-02-11: 40 mg via INTRA_ARTICULAR

## 2021-02-11 NOTE — Telephone Encounter (Signed)
**Note De-Identified  Obfuscation** The pt states that he is out of Eliquis and is requesting enough samples to last him until the 2nd Wednesday of May which is when he gets his check. I have advised him that we can leave him 2 boxes of Eliquis 5 mg in the front office at Dr Hassell Done office in [place] to pick up.  I did educate the pt on the importance of taking his Eliquis as directed without missing any doses and the dangers this can cause to him.   I did verify with him that he can afford the $48/30 day supply of his Eliquis going forward.  He thanked me for our assistance.

## 2021-02-11 NOTE — Patient Instructions (Signed)
Good to see you Please try ice on the knee   Please send me a message in MyChart with any questions or updates.  Please see me back in 6-8 weeks.   --Dr. Raeford Razor

## 2021-02-11 NOTE — Telephone Encounter (Signed)
Hey Lynn, LPN, can you please advise on this matter? Thanks  ?

## 2021-02-11 NOTE — Progress Notes (Signed)
Glenn Ortega - 87 y.o. male MRN [id number]  Date of birth: 1933-12-01  SUBJECTIVE:  Including CC & ROS.  No chief complaint on file.   Glenn Ortega is a 87 y.o. male that is presenting with acute exacerbation of right carpal tunnel and left knee pain.  Has received injections in the past that do improve his symptoms.  No injury or inciting event.  He has been working more.   Review of Systems See HPI   HISTORY: Past Medical, Surgical, Social, and Family History Reviewed & Updated per EMR.   Pertinent Historical Findings include:  Past Medical History:  Diagnosis Date  . Atrial fibrillation ([place])   . Back pain   . GERD (gastroesophageal reflux disease)   . Hyperlipidemia   . Hypertension    EKG and chest x ray 8/12 EPIC/states had stress test 2 yrs ago- doesnt remember where- not in EPIC  . Spinal stenosis    lumbar  . Thyroid trouble     Past Surgical History:  Procedure Laterality Date  . COLONOSCOPY    . CYSTOSCOPY  12/30/2011   Procedure: CYSTOSCOPY;  Surgeon: Reece Packer, MD;  [facility];  Service: Urology;  Laterality: N/A;  . EYE SURGERY Bilateral 06/18/2017   cataract sx with lens implant  . HERNIA REPAIR  2013  . HERNIA REPAIR Right    age  71  . LUMBAR LAMINECTOMY/DECOMPRESSION MICRODISCECTOMY N/A 11/09/2013   Procedure: Lumbar Laminectomy/Decompression, Microdiscectomy Lumbar Three-Four, Four-Five, with Coflex;  Surgeon: Faythe Ghee, MD;  [facility];  Service: Neurosurgery;  Laterality: N/A;  Lumbar Laminectomy/Decompression, Microdiscectomy Lumbar Three-Four, Four-Five, with Coflex  . PAROTIDECTOMY Left 11/17/2015   Procedure: LEFT PAROTIDECTOMY;  Surgeon: Izora Gala, MD;  Location: [place];  Service: ENT;  Laterality: Left;  . PROSTATE SURGERY  2013  . SALIVARY GLAND SURGERY    . TONSILLECTOMY    . trigger thumb  1980   right    Family History  Problem Relation Age of Onset  . Hypertension Other   . Cancer  Mother        bone?  . Myasthenia gravis Father   . Obesity Father   . Heart disease Brother     Social History   Socioeconomic History  . Marital status: Widowed    Spouse name: [name]  . Number of children: Not on file  . Years of education: Not on file  . Highest education level: Not on file  Occupational History  . Occupation: Retired  Tobacco Use  . Smoking status: Never Smoker  . Smokeless tobacco: Never Used  . Tobacco comment: per patient he never smoke. 11/25/2014 /rc  Vaping Use  . Vaping Use: Never used  Substance and Sexual Activity  . Alcohol use: Yes    Alcohol/week: 2.0 standard drinks    Types: 2 Standard drinks or equivalent per week    Comment: socially- occ beer  . Drug use: No  . Sexual activity: Not Currently  Other Topics Concern  . Not on file  Social History Narrative   Works 3 nights a week at [employer].    Previously worked as a Freight forwarder for [employer].   2 caffeinated drinks per day. Does not exercise he says secondary to back problems.  Lives in a one story home.     Has 2 children.  Education: some college. He is an is in [employer] from [place]   Wife passed  from cancer in 2021.    Social Determinants of Health   Financial Resource Strain: Not on file  Food Insecurity: Not on file  Transportation Needs: Not on file  Physical Activity: Not on file  Stress: Not on file  Social Connections: Not on file  Intimate Partner Violence: Not on file     PHYSICAL EXAM:  VS: BP 128/70 (BP Location: Left Arm, Patient Position: Sitting, Cuff Size: Large)   Ht 5\' 6"  (1.676 m)   Wt 204 lb (92.5 kg)   BMI 32.93 kg/m  Physical Exam Gen: NAD, alert, cooperative with exam, well-appearing MSK:  Left knee: Mild effusion. Normal range of motion. Symptoms palpation along the joint space. Neurovascular intact  Limited ultrasound: Right wrist:  Flattening and swelling of the median nerve within the carpal tunnel.  Summary: Carpal  tunnel syndrome of the right wrist  Ultrasound and interpretation by Clearance Coots, MD   Aspiration/Injection Procedure Note Glenn Ortega [birth year]  Procedure: Injection Indications: Right carpal tunnel  Procedure Details Consent: Risks of procedure as well as the alternatives and risks of each were explained to the (patient/caregiver).  Consent for procedure obtained. Time Out: Verified patient identification, verified procedure, site/side was marked, verified correct patient position, special equipment/implants available, medications/allergies/relevent history reviewed, required imaging and test results available.  Performed.  The area was cleaned with iodine and alcohol swabs.    The Right median nerve and carpal tunnel was injected using 1 cc's of 40 mg Depo-Medrol and 1 cc's of 0.25% bupivacaine with a 25 1 1/2" needle.  Ultrasound was used. Images were obtained in long views showing the injection.     A sterile dressing was applied.  Patient did tolerate procedure well.   Aspiration/Injection Procedure Note Glenn Ortega Jan 29, 1934  Procedure: Injection Indications: Left knee pain  Procedure Details Consent: Risks of procedure as well as the alternatives and risks of each were explained to the (patient/caregiver).  Consent for procedure obtained. Time Out: Verified patient identification, verified procedure, site/side was marked, verified correct patient position, special equipment/implants available, medications/allergies/relevent history reviewed, required imaging and test results available.  Performed.  The area was cleaned with iodine and alcohol swabs.    The left knee superior lateral suprapatellar pouch was injected using 1 cc's of 40 mg Kenalog and 4 cc's of 0.25% bupivacaine with a 22 1 1/2" needle.  Ultrasound was used. Images were obtained in long views showing the injection.     A sterile dressing was applied.  Patient did tolerate procedure  well.    ASSESSMENT & PLAN:   Carpal tunnel syndrome, bilateral Acute on chronic in nature.  Right side has been exacerbated today. -Counseled on home exercise therapy and supportive care. -Injection today. -Could consider release.  Primary osteoarthritis of left knee Acute on chronic in nature.  Mild effusion on exam and most changes appear to be lateral in nature.  Having some degenerative changes of meniscus laterally as well. -Counseled on home exercise therapy and supportive care. -Injection today. -Could consider gel injections.

## 2021-02-12 NOTE — Assessment & Plan Note (Signed)
Acute on chronic in nature.  Right side has been exacerbated today. -Counseled on home exercise therapy and supportive care. -Injection today. -Could consider release.

## 2021-02-12 NOTE — Assessment & Plan Note (Signed)
Acute on chronic in nature.  Mild effusion on exam and most changes appear to be lateral in nature.  Having some degenerative changes of meniscus laterally as well. -Counseled on home exercise therapy and supportive care. -Injection today. -Could consider gel injections.

## 2021-02-17 ENCOUNTER — Ambulatory Visit (INDEPENDENT_AMBULATORY_CARE_PROVIDER_SITE_OTHER): Payer: PPO

## 2021-02-17 DIAGNOSIS — Z0000 Encounter for general adult medical examination without abnormal findings: Secondary | ICD-10-CM

## 2021-02-17 NOTE — Patient Instructions (Signed)
Glenn Ortega , Thank you for taking time to come for your Medicare Wellness Visit. I appreciate your ongoing commitment to your health goals. Please review the following plan we discussed and let me know if I can assist you in the future.   Screening recommendations/referrals: Colonoscopy: no ;longer needed Recommended yearly ophthalmology/optometry visit for glaucoma screening and checkup Recommended yearly dental visit for hygiene and checkup  Vaccinations: Influenza vaccine: current due fall 2022 Pneumococcal vaccine: completed series Tdap vaccine: current due 04/08/2024 Shingles vaccine:   Will consider  Advanced directives: will provide copies  Conditions/risks identified: none  Next appointment: none  Preventive Care 65 Years and Older, Male Preventive care refers to lifestyle choices and visits with your health care provider that can promote health and wellness. What does preventive care include?  A yearly physical exam. This is also called an annual well check.  Dental exams once or twice a year.  Routine eye exams. Ask your health care provider how often you should have your eyes checked.  Personal lifestyle choices, including:  Daily care of your teeth and gums.  Regular physical activity.  Eating a healthy diet.  Avoiding tobacco and drug use.  Limiting alcohol use.  Practicing safe sex.  Taking low doses of aspirin every day.  Taking vitamin and mineral supplements as recommended by your health care provider. What happens during an annual well check? The services and screenings done by your health care provider during your annual well check will depend on your age, overall health, lifestyle risk factors, and family history of disease. Counseling  Your health care provider may ask you questions about your:  Alcohol use.  Tobacco use.  Drug use.  Emotional well-being.  Home and relationship well-being.  Sexual activity.  Eating habits.  History  of falls.  Memory and ability to understand (cognition).  Work and work Statistician. Screening  You may have the following tests or measurements:  Height, weight, and BMI.  Blood pressure.  Lipid and cholesterol levels. These may be checked every 5 years, or more frequently if you are over 38 years old.  Skin check.  Lung cancer screening. You may have this screening every year starting at age 81 if you have a 30-pack-year history of smoking and currently smoke or have quit within the past 15 years.  Fecal occult blood test (FOBT) of the stool. You may have this test every year starting at age 70.  Flexible sigmoidoscopy or colonoscopy. You may have a sigmoidoscopy every 5 years or a colonoscopy every 10 years starting at age 64.  Prostate cancer screening. Recommendations will vary depending on your family history and other risks.  Hepatitis C blood test.  Hepatitis B blood test.  Sexually transmitted disease (STD) testing.  Diabetes screening. This is done by checking your blood sugar (glucose) after you have not eaten for a while (fasting). You may have this done every 1-3 years.  Abdominal aortic aneurysm (AAA) screening. You may need this if you are a current or former smoker.  Osteoporosis. You may be screened starting at age 46 if you are at high risk. Talk with your health care provider about your test results, treatment options, and if necessary, the need for more tests. Vaccines  Your health care provider may recommend certain vaccines, such as:  Influenza vaccine. This is recommended every year.  Tetanus, diphtheria, and acellular pertussis (Tdap, Td) vaccine. You may need a Td booster every 10 years.  Zoster vaccine. You may need this  after age 68.  Pneumococcal 13-valent conjugate (PCV13) vaccine. One dose is recommended after age 47.  Pneumococcal polysaccharide (PPSV23) vaccine. One dose is recommended after age 64. Talk to your health care provider  about which screenings and vaccines you need and how often you need them. This information is not intended to replace advice given to you by your health care provider. Make sure you discuss any questions you have with your health care provider. Document Released: 10/31/2015 Document Revised: 06/23/2016 Document Reviewed: 08/05/2015 Elsevier Interactive Patient Education  2017 [place] Prevention in the Home Falls can cause injuries. They can happen to people of all ages. There are many things you can do to make your home safe and to help prevent falls. What can I do on the outside of my home?  Regularly fix the edges of walkways and driveways and fix any cracks.  Remove anything that might make you trip as you walk through a door, such as a raised step or threshold.  Trim any bushes or trees on the path to your home.  Use bright outdoor lighting.  Clear any walking paths of anything that might make someone trip, such as rocks or tools.  Regularly check to see if handrails are loose or broken. Make sure that both sides of any steps have handrails.  Any raised decks and porches should have guardrails on the edges.  Have any leaves, snow, or ice cleared regularly.  Use sand or salt on walking paths during winter.  Clean up any spills in your garage right away. This includes oil or grease spills. What can I do in the bathroom?  Use night lights.  Install grab bars by the toilet and in the tub and shower. Do not use towel bars as grab bars.  Use non-skid mats or decals in the tub or shower.  If you need to sit down in the shower, use a plastic, non-slip stool.  Keep the floor dry. Clean up any water that spills on the floor as soon as it happens.  Remove soap buildup in the tub or shower regularly.  Attach bath mats securely with double-sided non-slip rug tape.  Do not have throw rugs and other things on the floor that can make you trip. What can I do in the  bedroom?  Use night lights.  Make sure that you have a light by your bed that is easy to reach.  Do not use any sheets or blankets that are too big for your bed. They should not hang down onto the floor.  Have a firm chair that has side arms. You can use this for support while you get dressed.  Do not have throw rugs and other things on the floor that can make you trip. What can I do in the kitchen?  Clean up any spills right away.  Avoid walking on wet floors.  Keep items that you use a lot in easy-to-reach places.  If you need to reach something above you, use a strong step stool that has a grab bar.  Keep electrical cords out of the way.  Do not use floor polish or wax that makes floors slippery. If you must use wax, use non-skid floor wax.  Do not have throw rugs and other things on the floor that can make you trip. What can I do with my stairs?  Do not leave any items on the stairs.  Make sure that there are handrails on both sides of the  stairs and use them. Fix handrails that are broken or loose. Make sure that handrails are as long as the stairways.  Check any carpeting to make sure that it is firmly attached to the stairs. Fix any carpet that is loose or worn.  Avoid having throw rugs at the top or bottom of the stairs. If you do have throw rugs, attach them to the floor with carpet tape.  Make sure that you have a light switch at the top of the stairs and the bottom of the stairs. If you do not have them, ask someone to add them for you. What else can I do to help prevent falls?  Wear shoes that:  Do not have high heels.  Have rubber bottoms.  Are comfortable and fit you well.  Are closed at the toe. Do not wear sandals.  If you use a stepladder:  Make sure that it is fully opened. Do not climb a closed stepladder.  Make sure that both sides of the stepladder are locked into place.  Ask someone to hold it for you, if possible.  Clearly mark and make  sure that you can see:  Any grab bars or handrails.  First and last steps.  Where the edge of each step is.  Use tools that help you move around (mobility aids) if they are needed. These include:  Canes.  Walkers.  Scooters.  Crutches.  Turn on the lights when you go into a dark area. Replace any light bulbs as soon as they burn out.  Set up your furniture so you have a clear path. Avoid moving your furniture around.  If any of your floors are uneven, fix them.  If there are any pets around you, be aware of where they are.  Review your medicines with your doctor. Some medicines can make you feel dizzy. This can increase your chance of falling. Ask your doctor what other things that you can do to help prevent falls. This information is not intended to replace advice given to you by your health care provider. Make sure you discuss any questions you have with your health care provider. Document Released: 07/31/2009 Document Revised: 03/11/2016 Document Reviewed: 11/08/2014 Elsevier Interactive Patient Education  2017 [name].

## 2021-02-17 NOTE — Progress Notes (Signed)
Subjective:   Glenn Ortega is a 87 y.o. male who presents for Medicare Annual/Subsequent preventive examination.  I connected with Alphia Moh  today by telephone and verified that I am speaking with the correct person using two identifiers. Location patient: home Location provider: work Persons participating in the virtual visit: patient, provider.   I discussed the limitations, risks, security and privacy concerns of performing an evaluation and management service by telephone and the availability of in person appointments. I also discussed with the patient that there may be a patient responsible charge related to this service. The patient expressed understanding and verbally consented to this telephonic visit.    Interactive audio and video telecommunications were attempted between this provider and patient, however failed, due to patient having technical difficulties OR patient did not have access to video capability.  We continued and completed visit with audio only.    Review of Systems    n/a       Objective:    There were no vitals filed for this visit. There is no height or weight on file to calculate BMI.  Advanced Directives 12/27/2020 10/05/2019 02/08/2019 10/13/2017 09/07/2017 11/17/2015 11/13/2015  Does Patient Have a Medical Advance Directive? No No No No No No No  Would patient like information on creating a medical advance directive? No - Patient declined No - Patient declined No - Patient declined Yes (MAU/Ambulatory/Procedural Areas - Information given) - No - patient declined information -  Pre-existing out of facility DNR order (yellow form or pink MOST form) - - - - - - -    Current Medications (verified) Outpatient Encounter Medications as of 02/17/2021  Medication Sig  . atorvastatin (LIPITOR) 10 MG tablet TAKE ONE TABLET BY MOUTH DAILY  . ELIQUIS 5 MG TABS tablet TAKE ONE TABLET BY MOUTH TWICE A DAY  . levothyroxine (SYNTHROID) 50 MCG tablet TAKE ONE  TABLET BY MOUTH DAILY  . losartan (COZAAR) 25 MG tablet TAKE 1 TABLET BY MOUTH DAILY  . Multiple Vitamins-Minerals (CENTRUM SILVER 50+MEN) TABS 1-2 po qd  . Multiple Vitamins-Minerals (PRESERVISION AREDS 2 PO) Take by mouth.  . tizanidine (ZANAFLEX) 2 MG capsule Take 1 capsule (2 mg total) by mouth at bedtime.  . Vitamin D, Ergocalciferol, (DRISDOL) 1.25 MG (50000 UNIT) CAPS capsule Take 1 capsule (50,000 Units total) by mouth every 7 (seven) days.   No facility-administered encounter medications on file as of 02/17/2021.    Allergies (verified) Penicillins and Gabapentin   History: Past Medical History:  Diagnosis Date  . Atrial fibrillation ([place])   . Back pain   . GERD (gastroesophageal reflux disease)   . Hyperlipidemia   . Hypertension    EKG and chest x ray 8/12 [facility]/states had stress test 2 yrs ago- doesnt remember where- not in EPIC  . Spinal stenosis    lumbar  . Thyroid trouble    Past Surgical History:  Procedure Laterality Date  . COLONOSCOPY    . CYSTOSCOPY  12/30/2011   Procedure: CYSTOSCOPY;  Surgeon: Reece Packer, MD;  Location: [facility];  Service: Urology;  Laterality: N/A;  . EYE SURGERY Bilateral 06/18/2017   cataract sx with lens implant  . HERNIA REPAIR  2013  . HERNIA REPAIR Right    age  89  . LUMBAR LAMINECTOMY/DECOMPRESSION MICRODISCECTOMY N/A 11/09/2013   Procedure: Lumbar Laminectomy/Decompression, Microdiscectomy Lumbar Three-Four, Four-Five, with Coflex;  Surgeon: Faythe Ghee, MD;  Location: [facility];  Service: Neurosurgery;  Laterality: N/A;  Lumbar  Laminectomy/Decompression, Microdiscectomy Lumbar Three-Four, Four-Five, with Coflex  . PAROTIDECTOMY Left 11/17/2015   Procedure: LEFT PAROTIDECTOMY;  Surgeon: Izora Gala, MD;  Location: [facility];  Service: ENT;  Laterality: Left;  . PROSTATE SURGERY  2013  . SALIVARY GLAND SURGERY    . TONSILLECTOMY    . trigger thumb  1980   right   Family History  Problem Relation Age  of Onset  . Hypertension Other   . Cancer Mother        bone?  . Myasthenia gravis Father   . Obesity Father   . Heart disease Brother    Social History   Socioeconomic History  . Marital status: Widowed    Spouse name: [name]  . Number of children: Not on file  . Years of education: Not on file  . Highest education level: Not on file  Occupational History  . Occupation: Retired  Tobacco Use  . Smoking status: Never Smoker  . Smokeless tobacco: Never Used  . Tobacco comment: per patient he never smoke. 11/25/2014 /rc  Vaping Use  . Vaping Use: Never used  Substance and Sexual Activity  . Alcohol use: Yes    Alcohol/week: 2.0 standard drinks    Types: 2 Standard drinks or equivalent per week    Comment: socially- occ beer  . Drug use: No  . Sexual activity: Not Currently  Other Topics Concern  . Not on file  Social History Narrative   Works 3 nights a week at [employer].    Previously worked as a Freight forwarder for [employer].   2 caffeinated drinks per day. Does not exercise he says secondary to back problems.  Lives in a one story home.     Has 2 children.  Education: some college. He is an is in [employer] from [place]   Wife passed from [place] in 2021.    Social Determinants of Health   Financial Resource Strain: Not on file  Food Insecurity: Not on file  Transportation Needs: Not on file  Physical Activity: Not on file  Stress: Not on file  Social Connections: Not on file    Tobacco Counseling Counseling given: Not Answered Comment: per patient he never smoke. 11/25/2014 /rc   Clinical Intake:                 Diabetic?no         Activities of Daily Living No flowsheet data found.  Patient Care Team: [name], NP as PCP - General (Family Medicine)  Indicate any recent Medical Services you may have received from other than [facility] providers in the past year (date may be approximate).     Assessment:   This is a routine  wellness examination for [place].  Hearing/Vision screen No exam data present  Dietary issues and exercise activities discussed:    Goals Addressed   None    Depression Screen PHQ 2/9 Scores 07/15/2020 06/18/2020 11/08/2019 09/27/2018 10/13/2017 06/14/2017 04/08/2014  PHQ - 2 Score 1 0 0 0 0 0 0  PHQ- 9 Score 5 - - - - - -    Fall Risk Fall Risk  06/18/2020 11/23/2018 09/27/2018 10/13/2017 06/14/2017  Falls in the past year? 1 0 0 Yes Yes  Comment - - - Fell over dog tripped over dog leash  Number falls in past yr: 1 0 - 1 1  Injury with Fall? 0 0 - Yes No  Risk for fall due to : Other (Comment)  Impaired balance/gait - - -  Follow up - Falls evaluation completed - - -    FALL RISK PREVENTION PERTAINING TO THE HOME:  Any stairs in or around the home? Yes  If so, are there any without handrails? Yes  Home free of loose throw rugs in walkways, pet beds, electrical cords, etc? Yes  Adequate lighting in your home to reduce risk of falls? Yes   ASSISTIVE DEVICES UTILIZED TO PREVENT FALLS:  Life alert? No  Use of a cane, walker or w/c? No  Grab bars in the bathroom? No  Shower chair or bench in shower? No  Elevated toilet seat or a handicapped toilet? No   T Cognitive Function: Normal cognitive status assessed by direct observation by this Nurse Health Advisor. No abnormalities found.   MMSE - Mini Mental State Exam 10/13/2017  Orientation to time 5  Orientation to Place 5  Registration 3  Attention/ Calculation 4  Recall 2  Language- name 2 objects 2  Language- repeat 1  Language- follow 3 step command 3  Language- read & follow direction 1  Write a sentence 1  Copy design 1  Total score 28        Immunizations Immunization History  Administered Date(s) Administered  . Fluad Quad(high Dose 65+) 06/18/2020  . Influenza Whole 08/07/2008, 08/26/2009, 07/11/2010  . Influenza, High Dose Seasonal PF 08/27/2017, 08/02/2018  . Influenza,inj,Quad PF,6+ Mos 06/18/2014,  08/18/2015, 10/08/2019  . Influenza-Unspecified 09/17/2016  . PFIZER(Purple Top)SARS-COV-2 Vaccination 11/11/2019, 12/03/2019  . Pneumococcal Conjugate-13 04/08/2014  . Pneumococcal Polysaccharide-23 10/28/2015  . Tdap 04/08/2014    TDAP status: Up to date  Flu Vaccine status: Up to date  Pneumococcal vaccine status: Up to date  Covid-19 vaccine status: Completed vaccines  Qualifies for Shingles Vaccine? Yes   Zostavax completed No   Shingrix Completed?: No.    Education has been provided regarding the importance of this vaccine. Patient has been advised to call insurance company to determine out of pocket expense if they have not yet received this vaccine. Advised may also receive vaccine at local pharmacy or Health Dept. Verbalized acceptance and understanding.  Screening Tests Health Maintenance  Topic Date Due  . COVID-19 Vaccine (3 - Booster for Pfizer series) 06/01/2020  . INFLUENZA VACCINE  05/18/2021  . TETANUS/TDAP  04/08/2024  . PNA vac Low Risk Adult  Completed  . HPV VACCINES  Aged Out    Health Maintenance  Health Maintenance Due  Topic Date Due  . COVID-19 Vaccine (3 - Booster for Pfizer series) 06/01/2020    Colorectal cancer screening: No longer required.   Lung Cancer Screening: (Low Dose CT Chest recommended if Age 80-80 years, 30 pack-year currently smoking OR have quit w/in 15years.) does not qualify.   Lung Cancer Screening Referral: n/a  Additional Screening:  Hepatitis C Screening: does not qualify  Vision Screening: Recommended annual ophthalmology exams for early detection of glaucoma and other disorders of the eye. Is the patient up to date with their annual eye exam?  Yes  Who is the provider or what is the name of the office in which the patient attends annual eye exams? [facility] If pt is not established with a provider, would they like to be referred to a provider to establish care? No .   Dental Screening: Recommended annual  dental exams for proper oral hygiene  Community Resource Referral / Chronic Care Management: CRR required this visit?  No   CCM required this visit?  No      Plan:     I have personally reviewed and noted the following in the patient's chart:   . Medical and social history . Use of alcohol, tobacco or illicit drugs  . Current medications and supplements including opioid prescriptions. Patient is not currently taking opioid prescriptions. . Functional ability and status . Nutritional status . Physical activity . Advanced directives . List of other physicians . Hospitalizations, surgeries, and ER visits in previous 12 months . Vitals . Screenings to include cognitive, depression, and falls . Referrals and appointments  In addition, I have reviewed and discussed with patient certain preventive protocols, quality metrics, and best practice recommendations. A written personalized care plan for preventive services as well as general preventive health recommendations were provided to patient.     Randel Pigg, LPN   [phone]   Nurse Notes: none

## 2021-02-28 ENCOUNTER — Other Ambulatory Visit: Payer: Self-pay | Admitting: Family Medicine

## 2021-02-28 DIAGNOSIS — I4891 Unspecified atrial fibrillation: Secondary | ICD-10-CM

## 2021-03-02 ENCOUNTER — Other Ambulatory Visit: Payer: Self-pay | Admitting: Adult Health

## 2021-03-18 ENCOUNTER — Ambulatory Visit (HOSPITAL_BASED_OUTPATIENT_CLINIC_OR_DEPARTMENT_OTHER)
Admission: RE | Admit: 2021-03-18 | Discharge: 2021-03-18 | Disposition: A | Discharge status: Home / Self Care | Payer: PPO | Source: Ambulatory Visit | Attending: Family Medicine | Admitting: Family Medicine

## 2021-03-18 ENCOUNTER — Encounter: Payer: Self-pay | Admitting: Family Medicine

## 2021-03-18 ENCOUNTER — Other Ambulatory Visit: Payer: Self-pay

## 2021-03-18 ENCOUNTER — Ambulatory Visit (INDEPENDENT_AMBULATORY_CARE_PROVIDER_SITE_OTHER): Payer: PPO | Admitting: Family Medicine

## 2021-03-18 ENCOUNTER — Ambulatory Visit: Payer: Self-pay

## 2021-03-18 VITALS — BP 130/80 | Ht 66.0 in | Wt 204.0 lb

## 2021-03-18 DIAGNOSIS — T148XXA Other injury of unspecified body region, initial encounter: Secondary | ICD-10-CM

## 2021-03-18 DIAGNOSIS — M25521 Pain in right elbow: Secondary | ICD-10-CM

## 2021-03-18 DIAGNOSIS — W19XXXA Unspecified fall, initial encounter: Secondary | ICD-10-CM

## 2021-03-18 DIAGNOSIS — S8001XA Contusion of right knee, initial encounter: Secondary | ICD-10-CM | POA: Diagnosis not present

## 2021-03-18 DIAGNOSIS — M25561 Pain in right knee: Secondary | ICD-10-CM | POA: Diagnosis not present

## 2021-03-18 NOTE — Patient Instructions (Signed)
Good to see you  Please try heat on the knee  Please try compression  I will call the results.  Please send me a message in MyChart with any questions or updates.  Please see me back in 1 week.   --Dr. Raeford Razor

## 2021-03-18 NOTE — Progress Notes (Signed)
Glenn Ortega - 87 y.o. male MRN [id number]  Date of birth: 28-Apr-1934  SUBJECTIVE:  Including CC & ROS.  No chief complaint on file.   Glenn Ortega is a 87 y.o. male that is presenting with a fall.  He landed on his right knee as well as his right elbow.  Has significant pain and bruising around the right knee.  Feels like it is getting give way.  Landed on his right elbow but no significant pain but does have minor scrapes and bruises.   Review of Systems See HPI   HISTORY: Past Medical, Surgical, Social, and Family History Reviewed & Updated per EMR.   Pertinent Historical Findings include:  Past Medical History:  Diagnosis Date  . Atrial fibrillation ([place])   . Back pain   . GERD (gastroesophageal reflux disease)   . Hyperlipidemia   . Hypertension    EKG and chest x ray 8/12 EPIC/states had stress test 2 yrs ago- doesnt remember where- not in EPIC  . Spinal stenosis    lumbar  . Thyroid trouble     Past Surgical History:  Procedure Laterality Date  . COLONOSCOPY    . CYSTOSCOPY  12/30/2011   Procedure: CYSTOSCOPY;  Surgeon: Reece Packer, MD;  Location: [facility];  Service: Urology;  Laterality: N/A;  . EYE SURGERY Bilateral 06/18/2017   cataract sx with lens implant  . HERNIA REPAIR  2013  . HERNIA REPAIR Right    age  86  . LUMBAR LAMINECTOMY/DECOMPRESSION MICRODISCECTOMY N/A 11/09/2013   Procedure: Lumbar Laminectomy/Decompression, Microdiscectomy Lumbar Three-Four, Four-Five, with Coflex;  Surgeon: Faythe Ghee, MD;  Location: [facility];  Service: Neurosurgery;  Laterality: N/A;  Lumbar Laminectomy/Decompression, Microdiscectomy Lumbar Three-Four, Four-Five, with Coflex  . PAROTIDECTOMY Left 11/17/2015   Procedure: LEFT PAROTIDECTOMY;  Surgeon: Izora Gala, MD;  Location: [facility];  Service: ENT;  Laterality: Left;  . PROSTATE SURGERY  2013  . SALIVARY GLAND SURGERY    . TONSILLECTOMY    . trigger thumb  1980   right    Family History   Problem Relation Age of Onset  . Hypertension Other   . Cancer Mother        bone?  . Myasthenia gravis Father   . Obesity Father   . Heart disease Brother     Social History   Socioeconomic History  . Marital status: Widowed    Spouse name: [name]  . Number of children: Not on file  . Years of education: Not on file  . Highest education level: Not on file  Occupational History  . Occupation: Retired  Tobacco Use  . Smoking status: Never Smoker  . Smokeless tobacco: Never Used  . Tobacco comment: per patient he never smoke. 11/25/2014 /rc  Vaping Use  . Vaping Use: Never used  Substance and Sexual Activity  . Alcohol use: Yes    Alcohol/week: 2.0 standard drinks    Types: 2 Standard drinks or equivalent per week    Comment: socially- occ beer  . Drug use: No  . Sexual activity: Not Currently  Other Topics Concern  . Not on file  Social History Narrative   Works 3 nights a week at [employer].    Previously worked as a Freight forwarder for [employer].   2 caffeinated drinks per day. Does not exercise he says secondary to back problems.  Lives in a one story home.     Has 2 children.  Education: some college.  He is an is in [facility] from [place]   Wife passed from [place] in 2021.    Social Determinants of Health   Financial Resource Strain: Low Risk   . Difficulty of Paying Living Expenses: Not hard at all  Food Insecurity: No Food Insecurity  . Worried About Charity fundraiser in the Last Year: Never true  . Ran Out of Food in the Last Year: Never true  Transportation Needs: No Transportation Needs  . Lack of Transportation (Medical): No  . Lack of Transportation (Non-Medical): No  Physical Activity: Insufficiently Active  . Days of Exercise per Week: 3 days  . Minutes of Exercise per Session: 30 min  Stress: No Stress Concern Present  . Feeling of Stress : Not at all  Social Connections: Moderately Integrated  . Frequency of Communication with  Friends and Family: More than three times a week  . Frequency of Social Gatherings with Friends and Family: More than three times a week  . Attends Religious Services: 1 to 4 times per year  . Active Member of Clubs or Organizations: No  . Attends Archivist Meetings: 1 to 4 times per year  . Marital Status: Widowed  Intimate Partner Violence: Not At Risk  . Fear of Current or Ex-Partner: No  . Emotionally Abused: No  . Physically Abused: No  . Sexually Abused: No     PHYSICAL EXAM:  VS: BP 130/80 (BP Location: Left Arm, Patient Position: Sitting, Cuff Size: Normal)   Ht 5\' 6"  (1.676 m)   Wt 204 lb (92.5 kg)   BMI 32.93 kg/m  Physical Exam Gen: NAD, alert, cooperative with exam, well-appearing MSK:  Right knee: Prepatellar bursa present. Normal range of motion passively. Tenderness to palpation of the medial compartment. Neurovascularly intact  Limited ultrasound: Right knee:  Trace effusion. Normal-appearing quadricep patellar tendon. Large hematoma in the soft tissue from the medial compartment extending Laterally. Moderate degenerative changes of the medial joint space.  Summary: Hematoma apparent from trauma.  Ultrasound and interpretation by Clearance Coots, MD   Aspiration/Injection Procedure Note Glenn Ortega September 25, 1934  Procedure: Aspiration Indications: Right knee pain  Procedure Details Consent: Risks of procedure as well as the alternatives and risks of each were explained to the (patient/caregiver).  Consent for procedure obtained. Time Out: Verified patient identification, verified procedure, site/side was marked, verified correct patient position, special equipment/implants available, medications/allergies/relevent history reviewed, required imaging and test results available.  Performed.  The area was cleaned with iodine and alcohol swabs.    The right medial knee was injected using 4 cc's of 1% lidocaine with a 22 1 1/2" needle.  An  18-gauge 1-1/2 inch needle was used to achieve aspiration.  Ultrasound was used. Images were obtained in long views showing the injection.    Amount of Fluid Aspirated: minimal amount Character of Fluid: bloody Fluid was sent for:n/a  A sterile dressing was applied.  Patient did tolerate procedure well.    ASSESSMENT & PLAN:   Hematoma Hematoma over the medial aspect of the knee.  Does not appear to have fracture no significant exacerbation of underlying degenerative changes of the knee. -Counseled on home exercise therapy and supportive care. -Aspiration today. -Counseled on heat and compression. -Hinged knee brace. -X-ray of the right knee -Follow-up in 1 week.  Right elbow pain Has good range of motion and minimal pain. -Counseled on home exercise therapy and supportive care. -X-ray.

## 2021-03-19 ENCOUNTER — Telehealth: Payer: Self-pay | Admitting: Family Medicine

## 2021-03-19 DIAGNOSIS — M25521 Pain in right elbow: Secondary | ICD-10-CM | POA: Insufficient documentation

## 2021-03-19 DIAGNOSIS — T148XXA Other injury of unspecified body region, initial encounter: Secondary | ICD-10-CM | POA: Insufficient documentation

## 2021-03-19 MED ORDER — HYDROCODONE-ACETAMINOPHEN 5-325 MG PO TABS: 1.0000 | ORAL_TABLET | Freq: Three times a day (TID) | ORAL | 0 refills | Status: DC | PRN

## 2021-03-19 NOTE — Telephone Encounter (Signed)
Informed of results. Provided norco for pain.   Rosemarie Ax, MD [facility] 03/19/2021, 8:25 AM

## 2021-03-19 NOTE — Assessment & Plan Note (Signed)
Has good range of motion and minimal pain. -Counseled on home exercise therapy and supportive care. -X-ray.

## 2021-03-19 NOTE — Assessment & Plan Note (Signed)
Hematoma over the medial aspect of the knee.  Does not appear to have fracture no significant exacerbation of underlying degenerative changes of the knee. -Counseled on home exercise therapy and supportive care. -Aspiration today. -Counseled on heat and compression. -Hinged knee brace. -X-ray of the right knee -Follow-up in 1 week.

## 2021-03-20 DIAGNOSIS — S838X1A Sprain of other specified parts of right knee, initial encounter: Secondary | ICD-10-CM | POA: Diagnosis not present

## 2021-03-22 ENCOUNTER — Inpatient Hospital Stay (HOSPITAL_BASED_OUTPATIENT_CLINIC_OR_DEPARTMENT_OTHER)
Admission: EM | Admit: 2021-03-22 | Discharge: 2021-03-24 | DRG: 603 | Disposition: A | Discharge status: Home / Self Care | Payer: PPO | Attending: Internal Medicine | Admitting: Internal Medicine

## 2021-03-22 ENCOUNTER — Other Ambulatory Visit: Payer: Self-pay

## 2021-03-22 ENCOUNTER — Encounter (HOSPITAL_BASED_OUTPATIENT_CLINIC_OR_DEPARTMENT_OTHER): Payer: Self-pay | Admitting: Emergency Medicine

## 2021-03-22 ENCOUNTER — Emergency Department (HOSPITAL_BASED_OUTPATIENT_CLINIC_OR_DEPARTMENT_OTHER): Payer: PPO

## 2021-03-22 DIAGNOSIS — S8001XA Contusion of right knee, initial encounter: Secondary | ICD-10-CM | POA: Diagnosis present

## 2021-03-22 DIAGNOSIS — L03115 Cellulitis of right lower limb: Principal | ICD-10-CM | POA: Diagnosis present

## 2021-03-22 DIAGNOSIS — S80211A Abrasion, right knee, initial encounter: Secondary | ICD-10-CM | POA: Diagnosis not present

## 2021-03-22 DIAGNOSIS — Z880 Allergy status to penicillin: Secondary | ICD-10-CM | POA: Diagnosis not present

## 2021-03-22 DIAGNOSIS — Z7901 Long term (current) use of anticoagulants: Secondary | ICD-10-CM

## 2021-03-22 DIAGNOSIS — Z9842 Cataract extraction status, left eye: Secondary | ICD-10-CM

## 2021-03-22 DIAGNOSIS — S50311A Abrasion of right elbow, initial encounter: Secondary | ICD-10-CM | POA: Diagnosis present

## 2021-03-22 DIAGNOSIS — E039 Hypothyroidism, unspecified: Secondary | ICD-10-CM | POA: Diagnosis not present

## 2021-03-22 DIAGNOSIS — S8011XA Contusion of right lower leg, initial encounter: Secondary | ICD-10-CM | POA: Diagnosis not present

## 2021-03-22 DIAGNOSIS — Z888 Allergy status to other drugs, medicaments and biological substances status: Secondary | ICD-10-CM

## 2021-03-22 DIAGNOSIS — I480 Paroxysmal atrial fibrillation: Secondary | ICD-10-CM | POA: Diagnosis present

## 2021-03-22 DIAGNOSIS — Z8249 Family history of ischemic heart disease and other diseases of the circulatory system: Secondary | ICD-10-CM | POA: Diagnosis not present

## 2021-03-22 DIAGNOSIS — I10 Essential (primary) hypertension: Secondary | ICD-10-CM | POA: Diagnosis present

## 2021-03-22 DIAGNOSIS — Z20822 Contact with and (suspected) exposure to covid-19: Secondary | ICD-10-CM | POA: Diagnosis present

## 2021-03-22 DIAGNOSIS — E038 Other specified hypothyroidism: Secondary | ICD-10-CM

## 2021-03-22 DIAGNOSIS — Z809 Family history of malignant neoplasm, unspecified: Secondary | ICD-10-CM | POA: Diagnosis not present

## 2021-03-22 DIAGNOSIS — Y92481 Parking lot as the place of occurrence of the external cause: Secondary | ICD-10-CM | POA: Diagnosis not present

## 2021-03-22 DIAGNOSIS — L03119 Cellulitis of unspecified part of limb: Secondary | ICD-10-CM

## 2021-03-22 DIAGNOSIS — W010XXA Fall on same level from slipping, tripping and stumbling without subsequent striking against object, initial encounter: Secondary | ICD-10-CM | POA: Diagnosis present

## 2021-03-22 DIAGNOSIS — Z961 Presence of intraocular lens: Secondary | ICD-10-CM | POA: Diagnosis present

## 2021-03-22 DIAGNOSIS — Z9841 Cataract extraction status, right eye: Secondary | ICD-10-CM

## 2021-03-22 DIAGNOSIS — K219 Gastro-esophageal reflux disease without esophagitis: Secondary | ICD-10-CM | POA: Diagnosis not present

## 2021-03-22 DIAGNOSIS — I4891 Unspecified atrial fibrillation: Secondary | ICD-10-CM | POA: Diagnosis not present

## 2021-03-22 DIAGNOSIS — M25561 Pain in right knee: Secondary | ICD-10-CM | POA: Diagnosis not present

## 2021-03-22 DIAGNOSIS — E785 Hyperlipidemia, unspecified: Secondary | ICD-10-CM | POA: Diagnosis present

## 2021-03-22 DIAGNOSIS — M7989 Other specified soft tissue disorders: Secondary | ICD-10-CM | POA: Diagnosis not present

## 2021-03-22 LAB — CBC WITH DIFFERENTIAL/PLATELET
Abs Immature Granulocytes: 0.09 10*3/uL — ABNORMAL HIGH (ref 0.00–0.07)
Basophils Absolute: 0.1 10*3/uL (ref 0.0–0.1)
Basophils Relative: 1 %
Eosinophils Absolute: 0.2 10*3/uL (ref 0.0–0.5)
Eosinophils Relative: 2 %
HCT: 42 % (ref 39.0–52.0)
Hemoglobin: 14.2 g/dL (ref 13.0–17.0)
Immature Granulocytes: 1 %
Lymphocytes Relative: 18 %
Lymphs Abs: 1.7 10*3/uL (ref 0.7–4.0)
MCH: 28.8 pg (ref 26.0–34.0)
MCHC: 33.8 g/dL (ref 30.0–36.0)
MCV: 85.2 fL (ref 80.0–100.0)
Monocytes Absolute: 0.9 10*3/uL (ref 0.1–1.0)
Monocytes Relative: 10 %
Neutro Abs: 6.6 10*3/uL (ref 1.7–7.7)
Neutrophils Relative %: 68 %
Platelets: 230 10*3/uL (ref 150–400)
RBC: 4.93 MIL/uL (ref 4.22–5.81)
RDW: 14.6 % (ref 11.5–15.5)
WBC: 9.5 10*3/uL (ref 4.0–10.5)
nRBC: 0 % (ref 0.0–0.2)

## 2021-03-22 LAB — BASIC METABOLIC PANEL
Anion gap: 10 (ref 5–15)
BUN: 20 mg/dL (ref 8–23)
CO2: 26 mmol/L (ref 22–32)
Calcium: 8.9 mg/dL (ref 8.9–10.3)
Chloride: 103 mmol/L (ref 98–111)
Creatinine, Ser: 1 mg/dL (ref 0.61–1.24)
GFR, Estimated: >60 mL/min (ref >60)
Glucose, Bld: 84 mg/dL (ref 70–99)
Potassium: 4.1 mmol/L (ref 3.5–5.1)
Sodium: 139 mmol/L (ref 135–145)

## 2021-03-22 LAB — SEDIMENTATION RATE: Sed Rate: 30 mm/hr — ABNORMAL HIGH (ref 0–16)

## 2021-03-22 LAB — RESP PANEL BY RT-PCR (FLU A&B, COVID) ARPGX2
Influenza A by PCR: NEGATIVE
Influenza B by PCR: NEGATIVE
SARS Coronavirus 2 by RT PCR: NEGATIVE

## 2021-03-22 LAB — C-REACTIVE PROTEIN: CRP: 5.3 mg/dL — ABNORMAL HIGH (ref <1.0)

## 2021-03-22 MED ORDER — VANCOMYCIN HCL 1000 MG IV SOLR
INTRAVENOUS | Status: AC
  Filled 2021-03-22: qty 1000

## 2021-03-22 MED ORDER — VANCOMYCIN HCL IN DEXTROSE 1-5 GM/200ML-% IV SOLN
1000.0000 mg | INTRAVENOUS | Status: DC
  Administered 2021-03-23: 1000 mg via INTRAVENOUS
  Filled 2021-03-22: qty 200

## 2021-03-22 MED ORDER — HYDROCODONE-ACETAMINOPHEN 5-325 MG PO TABS
1.0000 | ORAL_TABLET | Freq: Four times a day (QID) | ORAL | Status: DC | PRN
  Administered 2021-03-23 – 2021-03-24 (×3): 1 via ORAL
  Filled 2021-03-22 (×3): qty 1

## 2021-03-22 MED ORDER — VANCOMYCIN HCL IN DEXTROSE 1-5 GM/200ML-% IV SOLN
1000.0000 mg | Freq: Once | INTRAVENOUS | Status: AC
  Administered 2021-03-22: 1000 mg via INTRAVENOUS

## 2021-03-22 MED ORDER — ONDANSETRON HCL 4 MG/2ML IJ SOLN: 4.0000 mg | Freq: Four times a day (QID) | INTRAMUSCULAR | Status: DC | PRN

## 2021-03-22 MED ORDER — MORPHINE SULFATE (PF) 2 MG/ML IV SOLN: 2.0000 mg | INTRAVENOUS | Status: DC | PRN

## 2021-03-22 MED ORDER — POLYETHYLENE GLYCOL 3350 17 G PO PACK: 17.0000 g | PACK | Freq: Every day | ORAL | Status: DC | PRN

## 2021-03-22 MED ORDER — ONDANSETRON HCL 4 MG PO TABS: 4.0000 mg | ORAL_TABLET | Freq: Four times a day (QID) | ORAL | Status: DC | PRN

## 2021-03-22 MED ORDER — ACETAMINOPHEN 325 MG PO TABS: 650.0000 mg | ORAL_TABLET | Freq: Four times a day (QID) | ORAL | Status: DC | PRN

## 2021-03-22 MED ORDER — LOSARTAN POTASSIUM 25 MG PO TABS
25.0000 mg | ORAL_TABLET | Freq: Every day | ORAL | Status: DC
  Administered 2021-03-23 – 2021-03-24 (×2): 25 mg via ORAL
  Filled 2021-03-22 (×2): qty 1

## 2021-03-22 MED ORDER — VANCOMYCIN HCL 1500 MG/300ML IV SOLN
1500.0000 mg | Freq: Once | INTRAVENOUS | Status: DC
  Filled 2021-03-22: qty 300

## 2021-03-22 MED ORDER — ATORVASTATIN CALCIUM 10 MG PO TABS
10.0000 mg | ORAL_TABLET | Freq: Every day | ORAL | Status: DC
  Administered 2021-03-23 – 2021-03-24 (×2): 10 mg via ORAL
  Filled 2021-03-22 (×2): qty 1

## 2021-03-22 MED ORDER — ACETAMINOPHEN 650 MG RE SUPP: 650.0000 mg | Freq: Four times a day (QID) | RECTAL | Status: DC | PRN

## 2021-03-22 MED ORDER — LEVOTHYROXINE SODIUM 50 MCG PO TABS
50.0000 ug | ORAL_TABLET | Freq: Every day | ORAL | Status: DC
  Administered 2021-03-23 – 2021-03-24 (×2): 50 ug via ORAL
  Filled 2021-03-22 (×2): qty 1

## 2021-03-22 MED ORDER — VANCOMYCIN HCL 500 MG/100ML IV SOLN
500.0000 mg | Freq: Once | INTRAVENOUS | Status: AC
  Administered 2021-03-22: 500 mg via INTRAVENOUS
  Filled 2021-03-22: qty 100

## 2021-03-22 MED ORDER — VANCOMYCIN HCL 500 MG IV SOLR
INTRAVENOUS | Status: AC
  Administered 2021-03-22: 1000 mg
  Filled 2021-03-22: qty 500

## 2021-03-22 NOTE — ED Notes (Signed)
Chips and crackers given to patient

## 2021-03-22 NOTE — H&P (Signed)
History and Physical    Glenn Ortega CNO:[id number] DOB: [birth year] DOA: 03/22/2021  PCP: [name], NP   Patient coming from: Home   Chief Complaint: Right knee, pain, swelling, redness, heat   HPI: Glenn Ortega is a 87 y.o. male with medical history significant for atrial fibrillation on Eliquis, hypertension, and hypothyroidism, now presenting to the emergency department for evaluation of worsening right knee redness, pain, heat, and swelling.  Patient reports that he tripped and fell on 03/17/2021, suffering abrasions to the right knee and right elbow.  He went on to develop significant swelling and pain at the right knee, was seen in a sports medicine clinic where aspiration was attempted but reportedly unsuccessful.  He was then seen at an orthopedic surgery clinic where he was started on doxycycline 2 days ago but continued to have increased redness, pain, swelling, and heat around the right knee and so he returned today and was directed to the ED.  He denies any fevers, chills, cough, shortness of breath, or chest pain.  [facility] [place] ED Course: Upon arrival to the ED, patient is found to be afebrile, saturating well on room air, and with stable blood pressure.  Chemistry panel and CBC are unremarkable.  ESR was 30.  Radiographs of the right knee are negative for acute fracture or dislocation but notable for degenerative changes, small joint effusion, chronic osteochondral injury at the medial femoral condyle, and soft tissue swelling.  ED physician discussed case with orthopedic surgery recommended admission for IV antibiotics and indicated that he may need surgery if he fails to respond to the antibiotic.  He was transferred to [facility] for admission.  Review of Systems:  All other systems reviewed and apart from HPI, are negative.  Past Medical History:  Diagnosis Date  . Atrial fibrillation ([place])   . Back pain   . GERD (gastroesophageal reflux disease)   .  Hyperlipidemia   . Hypertension    EKG and chest x ray 8/12 EPIC/states had stress test 2 yrs ago- doesnt remember where- not in EPIC  . Spinal stenosis    lumbar  . Thyroid trouble     Past Surgical History:  Procedure Laterality Date  . COLONOSCOPY    . CYSTOSCOPY  12/30/2011   Procedure: CYSTOSCOPY;  Surgeon: Reece Packer, MD;  Location: [facility];  Service: Urology;  Laterality: N/A;  . EYE SURGERY Bilateral 06/18/2017   cataract sx with lens implant  . HERNIA REPAIR  2013  . HERNIA REPAIR Right    age  31  . LUMBAR LAMINECTOMY/DECOMPRESSION MICRODISCECTOMY N/A 11/09/2013   Procedure: Lumbar Laminectomy/Decompression, Microdiscectomy Lumbar Three-Four, Four-Five, with Coflex;  Surgeon: Faythe Ghee, MD;  Location: [facility];  Service: Neurosurgery;  Laterality: N/A;  Lumbar Laminectomy/Decompression, Microdiscectomy Lumbar Three-Four, Four-Five, with Coflex  . PAROTIDECTOMY Left 11/17/2015   Procedure: LEFT PAROTIDECTOMY;  Surgeon: Izora Gala, MD;  Location: [facility];  Service: ENT;  Laterality: Left;  . PROSTATE SURGERY  2013  . SALIVARY GLAND SURGERY    . TONSILLECTOMY    . trigger thumb  1980   right    Social History:   reports that he has never smoked. He has never used smokeless tobacco. He reports current alcohol use of about 2.0 standard drinks of alcohol per week. He reports that he does not use drugs.  Allergies  Allergen Reactions  . Penicillins Swelling    CHILDHOOD ALLERGY Has patient had a PCN reaction causing  immediate rash, facial/tongue/throat swelling, SOB or lightheadedness with hypotension: Yes Has patient had a PCN reaction causing severe rash involving mucus membranes or skin necrosis: No Has patient had a PCN reaction that required hospitalization: No Has patient had a PCN reaction occurring within the last 10 years: No If all of the above answers are "NO", then may proceed with Cephalosporin use.   . Gabapentin Other (See  Comments)    "MADE ME FEEL CRAZY"  . Lisinopril Cough  . Penicillin G Hives    Family History  Problem Relation Age of Onset  . Hypertension Other   . Cancer Mother        bone?  . Myasthenia gravis Father   . Obesity Father   . Heart disease Brother      Prior to Admission medications   Medication Sig Start Date End Date Taking? Authorizing Provider  atorvastatin (LIPITOR) 10 MG tablet TAKE ONE TABLET BY MOUTH DAILY Patient taking differently: Take 10 mg by mouth at bedtime. 11/25/20  Yes Nafziger, Tommi Rumps, NP  doxycycline (VIBRA-TABS) 100 MG tablet Take 100 mg by mouth 2 (two) times daily. 03/20/21  Yes [provider]  ELIQUIS 5 MG TABS tablet TAKE ONE TABLET BY MOUTH TWICE A DAY Patient taking differently: Take 5 mg by mouth 2 (two) times daily. 03/02/21  Yes Hali Marry, MD  HYDROcodone-acetaminophen (NORCO/VICODIN) 5-325 MG tablet Take 1 tablet by mouth every 8 (eight) hours as needed. Patient taking differently: Take 1 tablet by mouth every 8 (eight) hours as needed for severe pain. 03/19/21  Yes Rosemarie Ax, MD  levothyroxine (SYNTHROID) 50 MCG tablet TAKE ONE TABLET BY MOUTH DAILY Patient taking differently: Take 50 mcg by mouth every morning. 03/02/21  Yes Nafziger, Tommi Rumps, NP  losartan (COZAAR) 25 MG tablet TAKE 1 TABLET BY MOUTH DAILY Patient taking differently: Take 25 mg by mouth every morning. 11/26/20  Yes Nafziger, Tommi Rumps, NP  Multiple Vitamins-Minerals (PRESERVISION AREDS 2 PO) Take 1 capsule by mouth 2 (two) times daily.   Yes [provider]    Physical Exam: Vitals:   03/22/21 1616 03/22/21 1619 03/22/21 1700 03/22/21 1932  BP:  140/83 (!) 136/101 (!) 159/77  Pulse: 80 70 65 71  Resp: _0 Temp:    97.8 F (36.6 C)  TempSrc:    Oral  SpO2: 98% 99% 94% 98%    Constitutional: NAD, calm  Eyes: PERTLA, lids and conjunctivae normal ENMT: Mucous membranes are moist. Posterior pharynx clear of any exudate or lesions.   Neck: supple,  no masses  Respiratory:  no wheezing, no crackles. No accessory muscle use.  Cardiovascular: Rate ~60. Right leg edema.  Abdomen: No distension, no tenderness, soft. Bowel sounds active.  Musculoskeletal: no clubbing / cyanosis. Right knee heat, erythema, and edema; ROM intact.   Skin: Abrasions to anterior right knee extensor aspect of right elbow. Edema, erythema, and heat centered around anteromedial right knee. Warm, dry, well-perfused. Neurologic: CN 2-12 grossly intact. Sensation intact. Moving all extremities.  Psychiatric: Alert and oriented to person, place, and situation. Pleasant and cooperative.    Labs and Imaging on Admission: I have personally reviewed following labs and imaging studies  CBC: Recent Labs  Lab 03/22/21 1618  WBC 9.5  NEUTROABS 6.6  HGB 14.2  HCT 42.0  MCV 85.2  PLT 010   Basic Metabolic Panel: Recent Labs  Lab 03/22/21 1618  NA 139  K 4.1  CL 103  CO2 26  GLUCOSE  84  BUN 20  CREATININE 1.00  CALCIUM 8.9   GFR: Estimated Creatinine Clearance: 58.6 mL/min (by C-G formula based on SCr of 1 mg/dL). Liver Function Tests: No results for input(s): AST, ALT, ALKPHOS, BILITOT, PROT, ALBUMIN in the last 168 hours. No results for input(s): LIPASE, AMYLASE in the last 168 hours. No results for input(s): AMMONIA in the last 168 hours. Coagulation Profile: No results for input(s): INR, PROTIME in the last 168 hours. Cardiac Enzymes: No results for input(s): CKTOTAL, CKMB, CKMBINDEX, TROPONINI in the last 168 hours. BNP (last 3 results) No results for input(s): PROBNP in the last 8760 hours. HbA1C: No results for input(s): HGBA1C in the last 72 hours. CBG: No results for input(s): GLUCAP in the last 168 hours. Lipid Profile: No results for input(s): CHOL, HDL, LDLCALC, TRIG, CHOLHDL, LDLDIRECT in the last 72 hours. Thyroid Function Tests: No results for input(s): TSH, T4TOTAL, FREET4, T3FREE, THYROIDAB in the last 72 hours. Anemia Panel: No  results for input(s): VITAMINB12, FOLATE, FERRITIN, TIBC, IRON, RETICCTPCT in the last 72 hours. Urine analysis: No results found for: COLORURINE, APPEARANCEUR, LABSPEC, PHURINE, GLUCOSEU, HGBUR, BILIRUBINUR, KETONESUR, PROTEINUR, UROBILINOGEN, NITRITE, LEUKOCYTESUR Sepsis Labs: _0 (procalcitonin:4,lacticidven:4) ) Recent Results (from the past 240 hour(s))  Resp Panel by RT-PCR (Flu A&B, Covid) Nasopharyngeal Swab     Status: None   Collection Time: 03/22/21  4:18 PM   Specimen: Nasopharyngeal Swab; Nasopharyngeal(NP) swabs in vial transport medium  Result Value Ref Range Status   SARS Coronavirus 2 by RT PCR NEGATIVE NEGATIVE Final    Comment: (NOTE) SARS-CoV-2 target nucleic acids are NOT DETECTED.  The SARS-CoV-2 RNA is generally detectable in upper respiratory specimens during the acute phase of infection. The lowest concentration of SARS-CoV-2 viral copies this assay can detect is 138 copies/mL. A negative result does not preclude SARS-Cov-2 infection and should not be used as the sole basis for treatment or other patient management decisions. A negative result may occur with  improper specimen collection/handling, submission of specimen other than nasopharyngeal swab, presence of viral mutation(s) within the areas targeted by this assay, and inadequate number of viral copies(<138 copies/mL). A negative result must be combined with clinical observations, patient history, and epidemiological information. The expected result is Negative.  Fact Sheet for Patients:  EntrepreneurPulse.com.au  Fact Sheet for Healthcare Providers:  IncredibleEmployment.be  This test is no t yet approved or cleared by the [place] FDA and  has been authorized for detection and/or diagnosis of SARS-CoV-2 by FDA under an Emergency Use Authorization (EUA). This EUA will remain  in effect (meaning this test can be used) for the duration of the COVID-19  declaration under Section 564(b)(1) of the Act, 21 U.S.C.section 360bbb-3(b)(1), unless the authorization is terminated  or revoked sooner.       Influenza A by PCR NEGATIVE NEGATIVE Final   Influenza B by PCR NEGATIVE NEGATIVE Final    Comment: (NOTE) The Xpert Xpress SARS-CoV-2/FLU/RSV plus assay is intended as an aid in the diagnosis of influenza from Nasopharyngeal swab specimens and should not be used as a sole basis for treatment. Nasal washings and aspirates are unacceptable for Xpert Xpress SARS-CoV-2/FLU/RSV testing.  Fact Sheet for Patients: EntrepreneurPulse.com.au  Fact Sheet for Healthcare Providers: IncredibleEmployment.be  This test is not yet approved or cleared by the [place] FDA and has been authorized for detection and/or diagnosis of SARS-CoV-2 by FDA under an Emergency Use Authorization (EUA). This EUA will remain in effect (meaning this test can be used) for the  duration of the COVID-19 declaration under Section 564(b)(1) of the Act, 21 U.S.C. section 360bbb-3(b)(1), unless the authorization is terminated or revoked.  Performed at [facility], [address], [place], [place] [address]      Radiological Exams on Admission: DG Knee Complete 4 Views Right  Result Date: 03/22/2021 CLINICAL DATA:  [place] last week, progressively worsening knee pain, swelling, possible joint effusion EXAM: RIGHT KNEE - COMPLETE 4+ VIEW COMPARISON:  03/18/2021 FINDINGS: Osseous demineralization. Joint space narrowing greatest at medial compartment. Minimal chondrocalcinosis. Again identified osteochondral irregularity at the medial femoral condyle, at present since at least 09/17/2019. Diffuse soft tissue swelling especially at anterior and medial RIGHT knee region. No acute fracture, dislocation, or bone destruction. Small joint effusion. IMPRESSION: Osseous demineralization with degenerative changes and small joint  effusion. Chronic osteochondral irregularity at medial femoral condyle present since at least 09/17/2019. Soft tissue swelling RIGHT knee without acute fracture or dislocation. Electronically Signed   By: Lavonia Dana M.D.   On: 03/22/2021 14:38    Assessment/Plan   1. Right knee cellulitis  - Presents with worsening right knee erythema, edema, pain, and heat despite taking doxycycline  - Orthopedic surgery advised IV antibiotics for now, may need surgery if fails to improve  - Continue IV vancomycin, trend inflammatory markers and clinical response to treatment    2. Atrial fibrillation  - CHADS-VASc 3 for HTN and age x2  - Last dose Eliquis was am of 6/5, will hold for now with plan to resume in next 24-48 hrs if knee improvement with medical mgmt, otherwise may need surgery    3. Hypothyroidism  - Continue Synthroid    4. Hypertension  - Continue losartan     DVT prophylaxis: Eliquis pta, SCDs for now Code Status: Full  Level of Care: Level of care: Med-Surg Family Communication: none present  Disposition Plan:  Patient is from: Home  Anticipated d/c is to: Home  Anticipated d/c date is: 03/25/21 Patient currently: Pending improvement with IV antibiotics or may need surgery  Consults called: Orthopedic surgery consulted by ED physician  Admission status: Inpatient     Vianne Bulls, MD [facility]  03/22/2021, 7:57 PM

## 2021-03-22 NOTE — Plan of Care (Signed)
POC initiated 

## 2021-03-22 NOTE — Progress Notes (Signed)
Pharmacy Antibiotic Note  Glenn Ortega is a 87 y.o. male admitted on 03/22/2021 with cellulitis.  Pharmacy has been consulted for vancomycin dosing.  Plan: Vancomycin 1500 mg IV x 1, then 1000 mg IV q24h (eAUC 474, Goal AUC 400-550, SCr 1) Monitor renal function, clinical progression to narrow Vancomycin levels as needed     Temp (24hrs), Avg:98 F (36.7 C), Min:98 F (36.7 C), Max:98 F (36.7 C)  Recent Labs  Lab 03/22/21 1618  WBC 9.5  CREATININE 1.00    Estimated Creatinine Clearance: 58.6 mL/min (by C-G formula based on SCr of 1 mg/dL).    Allergies  Allergen Reactions  . Penicillins Swelling    CHILDHOOD ALLERGY Has patient had a PCN reaction causing immediate rash, facial/tongue/throat swelling, SOB or lightheadedness with hypotension: Yes Has patient had a PCN reaction causing severe rash involving mucus membranes or skin necrosis: No Has patient had a PCN reaction that required hospitalization: No Has patient had a PCN reaction occurring within the last 10 years: No If all of the above answers are "NO", then may proceed with Cephalosporin use.   . Gabapentin     "MADE ME FEEL CRAZY"    Bertis Ruddy, PharmD Clinical Pharmacist ED Pharmacist Phone # [phone] 03/22/2021 5:10 PM

## 2021-03-22 NOTE — Plan of Care (Signed)
POC will be initiated upon arrival

## 2021-03-22 NOTE — ED Provider Notes (Signed)
[facility] EMERGENCY DEPT Provider Note   CSN: [id number] Arrival date & time: 03/22/21  1301     History Chief Complaint  Patient presents with  . Knee Injury    Glenn Ortega is a 87 y.o. male.  87 year old male with past medical history below including atrial fibrillation on Eliquis who presents with right knee swelling and pain.  Approximately 5 days ago, patient had a mechanical fall during which he injured his right knee, right elbow, and back of head.  He did not lose consciousness.  He was evaluated in the sports medicine clinic by Dr. Raeford Razor had x-rays of the elbow and knee which were negative for fracture.  Dr. Raeford Razor tried to aspirate blood off of his knee without success.  He continued to have pain and followed up at [facility] 2 days ago.  There, they started him on antibiotics because he has an abrasion on his knee.  Since then, he has had progressively worsening swelling and pain in his knee that feels like a burning sensation.  He has been trying to stay off of his leg and keeping it as elevated as possible.  He denies any fevers or malaise.  He went back to [facility] urgent care today and was referred to the ED for evaluation.  He has been compliant with antibiotics.  The history is provided by the patient.       Past Medical History:  Diagnosis Date  . Atrial fibrillation ([place])   . Back pain   . GERD (gastroesophageal reflux disease)   . Hyperlipidemia   . Hypertension    EKG and chest x ray 8/12 EPIC/states had stress test 2 yrs ago- doesnt remember where- not in EPIC  . Spinal stenosis    lumbar  . Thyroid trouble     Patient Active Problem List   Diagnosis Date Noted  . Hematoma 03/19/2021  . Right elbow pain 03/19/2021  . Acute bilateral low back pain without sciatica 11/12/2020  . Essential hypertension 08/18/2020  . Depressed mood, not depression 08/18/2020  . Senile purpura ([place]) 08/14/2020  . Vitamin D deficiency 07/29/2020   . Hypothyroidism 07/15/2020  . Hyperglycemia 07/15/2020  . BMI 38.0-38.9,adult 06/18/2020  . Bilateral lower extremity edema 06/18/2020  . Atrial fibrillation ([place]) 06/17/2020  . BPPV (benign paroxysmal positional vertigo) 06/17/2020  . Primary osteoarthritis of left knee 09/17/2019  . Hyperlipidemia 09/27/2018  . 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 Parotid mass 11/17/2015  . Carpal tunnel syndrome, bilateral 08/18/2015  . Unspecified hereditary and idiopathic peripheral neuropathy 06/18/2014  . Lumbar spinal stenosis 11/09/2013  . ESOPHAGEAL REFLUX 04/10/2010  . HYPERTENSION, PULMONARY 04/07/2010  . UNSPECIFIED HEARING LOSS 08/07/2008  . HYPERTENSION, BENIGN 08/07/2008  . BPH (benign prostatic hyperplasia) 08/07/2008  . SCIATICA 08/07/2008    Past Surgical History:  Procedure Laterality Date  . COLONOSCOPY    . CYSTOSCOPY  12/30/2011   Procedure: CYSTOSCOPY;  Surgeon: Reece Packer, MD;  Location: [facility];  Service: Urology;  Laterality: N/A;  . EYE SURGERY Bilateral 06/18/2017   cataract sx with lens implant  . HERNIA REPAIR  2013  . HERNIA REPAIR Right    age  55  . LUMBAR LAMINECTOMY/DECOMPRESSION MICRODISCECTOMY N/A 11/09/2013   Procedure: Lumbar Laminectomy/Decompression, Microdiscectomy Lumbar Three-Four, Four-Five, with Coflex;  Surgeon: Faythe Ghee, MD;  Location: [facility];  Service: Neurosurgery;  Laterality: N/A;  Lumbar Laminectomy/Decompression, Microdiscectomy Lumbar Three-Four, Four-Five,  with Coflex  . PAROTIDECTOMY Left 11/17/2015   Procedure: LEFT PAROTIDECTOMY;  Surgeon: Izora Gala, MD;  Location: [facility];  Service: ENT;  Laterality: Left;  . PROSTATE SURGERY  2013  . SALIVARY GLAND SURGERY    . TONSILLECTOMY    . trigger thumb  1980   right       Family History  Problem Relation Age of Onset  . Hypertension Other   . Cancer Mother        bone?  . Myasthenia gravis  Father   . Obesity Father   . Heart disease Brother     Social History   Tobacco Use  . Smoking status: Never Smoker  . Smokeless tobacco: Never Used  . Tobacco comment: per patient he never smoke. 11/25/2014 /rc  Vaping Use  . Vaping Use: Never used  Substance Use Topics  . Alcohol use: Yes    Alcohol/week: 2.0 standard drinks    Types: 2 Standard drinks or equivalent per week    Comment: socially- occ beer  . Drug use: No    Home Medications Prior to Admission medications   Medication Sig Start Date End Date Taking? Authorizing Provider  HYDROcodone-acetaminophen (NORCO/VICODIN) 5-325 MG tablet Take 1 tablet by mouth every 8 (eight) hours as needed. 03/19/21   Rosemarie Ax, MD  atorvastatin (LIPITOR) 10 MG tablet TAKE ONE TABLET BY MOUTH DAILY 11/25/20   Nafziger, Tommi Rumps, NP  ELIQUIS 5 MG TABS tablet TAKE ONE TABLET BY MOUTH TWICE A DAY 03/02/21   Hali Marry, MD  levothyroxine (SYNTHROID) 50 MCG tablet TAKE ONE TABLET BY MOUTH DAILY 03/02/21   Nafziger, Tommi Rumps, NP  losartan (COZAAR) 25 MG tablet TAKE 1 TABLET BY MOUTH DAILY 11/26/20   [name], NP  Multiple Vitamins-Minerals (CENTRUM SILVER 50+MEN) TABS 1-2 po qd 07/29/20   Opalski, Neoma Laming, DO  Multiple Vitamins-Minerals (PRESERVISION AREDS 2 PO) Take by mouth.    [provider]  tizanidine (ZANAFLEX) 2 MG capsule Take 1 capsule (2 mg total) by mouth at bedtime. Patient not taking: Reported on 02/17/2021 12/16/20   [name], NP  Vitamin D, Ergocalciferol, (DRISDOL) 1.25 MG (50000 UNIT) CAPS capsule Take 1 capsule (50,000 Units total) by mouth every 7 (seven) days. Patient not taking: Reported on 02/17/2021 09/08/20   Mellody Dance, DO    Allergies    Penicillins and Gabapentin  Review of Systems   Review of Systems All other systems reviewed and are negative except that which was mentioned in HPI  Physical Exam Updated Vital Signs BP 129/77 (BP Location: Right Arm)   Pulse 95   Temp 98 F  (36.7 C) (Oral)   Resp 16   SpO2 99%   Physical Exam Constitutional:      General: He is not in acute distress.    Appearance: Normal appearance.  HENT:     Head: Normocephalic and atraumatic.  Eyes:     Conjunctiva/sclera: Conjunctivae normal.  Cardiovascular:     Rate and Rhythm: Normal rate and regular rhythm.     Pulses: Normal pulses.     Heart sounds: Normal heart sounds. No murmur heard.   Pulmonary:     Effort: Pulmonary effort is normal.     Breath sounds: Normal breath sounds.  Abdominal:     General: Abdomen is flat. Bowel sounds are normal. There is no distension.     Palpations: Abdomen is soft.     Tenderness: There is no abdominal tenderness.  Musculoskeletal:  General: Swelling and tenderness present.     Right lower leg: No edema.     Left lower leg: No edema.     Comments: Significant edema and ecchymoses of left medial thigh to knee with ecchymoses extending down medial side of leg; prepatellar edema; abrasion just distal to patella with erythema surrounding medial side of knee  Skin:    General: Skin is warm and dry.  Neurological:     Mental Status: He is alert and oriented to person, place, and time.     Comments: fluent  Psychiatric:        Mood and Affect: Mood normal.        Behavior: Behavior normal.     ED Results / Procedures / Treatments   Labs (all labs ordered are listed, but only abnormal results are displayed) Labs Reviewed - No data to display  EKG None  Radiology DG Knee Complete 4 Views Right  Result Date: 03/22/2021 CLINICAL DATA:  [place] last week, progressively worsening knee pain, swelling, possible joint effusion EXAM: RIGHT KNEE - COMPLETE 4+ VIEW COMPARISON:  03/18/2021 FINDINGS: Osseous demineralization. Joint space narrowing greatest at medial compartment. Minimal chondrocalcinosis. Again identified osteochondral irregularity at the medial femoral condyle, at present since at least 09/17/2019. Diffuse soft tissue  swelling especially at anterior and medial RIGHT knee region. No acute fracture, dislocation, or bone destruction. Small joint effusion. IMPRESSION: Osseous demineralization with degenerative changes and small joint effusion. Chronic osteochondral irregularity at medial femoral condyle present since at least 09/17/2019. Soft tissue swelling RIGHT knee without acute fracture or dislocation. Electronically Signed   By: Lavonia Dana M.D.   On: 03/22/2021 14:38    Procedures Procedures   Medications Ordered in ED Medications - No data to display  ED Course  I have reviewed the triage vital signs and the nursing notes.  Pertinent labs & imaging results that were available during my care of the patient were reviewed by me and considered in my medical decision making (see chart for details).    MDM Rules/Calculators/A&P                          Exam concerning for cellulitis versus prepatellar infectious bursitis versus septic joint.  X-ray is negative for fracture or joint effusion.  I discussed with orthopedics on-call Dr. Marcelino Scot who recommended admission to medicine for IV antibiotics.  If the patient's symptoms progress and infection seems to coalesce, he may require surgical intervention in the future but IV antibiotics only for now.  We will obtain screening lab work then discussed with [facility] hospitalist for transfer and admission.  I have ordered vancomycin. Final Clinical Impression(s) / ED Diagnoses Final diagnoses:  None    Rx / DC Orders ED Discharge Orders    None       Josuha Fontanez, Wenda Overland, MD 03/22/21 [phone]

## 2021-03-22 NOTE — ED Notes (Signed)
Attempted to call report to floor 

## 2021-03-22 NOTE — ED Triage Notes (Addendum)
Pt arrives to ED with c/o right knee pain and swelling x1 week ago. Pt fell in a parking lot causing him to land on his right knee. Dr. Raeford Razor treated pt and performed an aspiration of hematoma of the right knee x2 days ago. Pt has had scans which showed no fractures. Pt reports he is no getting pain relief with elevation, compression, or heating the knee. States worsening of burning and swelling.

## 2021-03-23 ENCOUNTER — Inpatient Hospital Stay (HOSPITAL_COMMUNITY): Payer: PPO

## 2021-03-23 DIAGNOSIS — E039 Hypothyroidism, unspecified: Secondary | ICD-10-CM

## 2021-03-23 DIAGNOSIS — I480 Paroxysmal atrial fibrillation: Secondary | ICD-10-CM

## 2021-03-23 LAB — SEDIMENTATION RATE: Sed Rate: 42 mm/hr — ABNORMAL HIGH (ref 0–16)

## 2021-03-23 LAB — CBC
HCT: 37 % — ABNORMAL LOW (ref 39.0–52.0)
Hemoglobin: 12.7 g/dL — ABNORMAL LOW (ref 13.0–17.0)
MCH: 29.1 pg (ref 26.0–34.0)
MCHC: 34.3 g/dL (ref 30.0–36.0)
MCV: 84.9 fL (ref 80.0–100.0)
Platelets: 214 10*3/uL (ref 150–400)
RBC: 4.36 MIL/uL (ref 4.22–5.81)
RDW: 14.3 % (ref 11.5–15.5)
WBC: 8.8 10*3/uL (ref 4.0–10.5)
nRBC: 0 % (ref 0.0–0.2)

## 2021-03-23 LAB — BASIC METABOLIC PANEL
Anion gap: 5 (ref 5–15)
BUN: 19 mg/dL (ref 8–23)
CO2: 28 mmol/L (ref 22–32)
Calcium: 8.8 mg/dL — ABNORMAL LOW (ref 8.9–10.3)
Chloride: 106 mmol/L (ref 98–111)
Creatinine, Ser: 1.04 mg/dL (ref 0.61–1.24)
GFR, Estimated: >60 mL/min (ref >60)
Glucose, Bld: 109 mg/dL — ABNORMAL HIGH (ref 70–99)
Potassium: 3.8 mmol/L (ref 3.5–5.1)
Sodium: 139 mmol/L (ref 135–145)

## 2021-03-23 LAB — C-REACTIVE PROTEIN: CRP: 4.4 mg/dL — ABNORMAL HIGH (ref <1.0)

## 2021-03-23 MED ORDER — SODIUM CHLORIDE 0.9 % IV SOLN
2.0000 g | INTRAVENOUS | Status: DC
  Administered 2021-03-23: 2 g via INTRAVENOUS
  Filled 2021-03-23 (×2): qty 20

## 2021-03-23 NOTE — Progress Notes (Signed)
Report received from [facility] RN/ED. Pt to room 1345 via stretcher /[facility]. Transferred to bed without difficulty. Education initiated on use of call bell for needs/safety and pt's handbook given to pt at bedside

## 2021-03-23 NOTE — Consult Note (Signed)
ORTHOPAEDIC CONSULTATION  REQUESTING PHYSICIAN: Aline August, MD  Chief Complaint: right knee pain and swelling  HPI: Glenn Ortega is a 87 y.o. male with a h/o A fib on Eliquis, HTN, and hypothyroidism who complains of right knee pain and swelling since he fell in the parking lot on 5/31. He says he stepped off the curb and fell directly onto his right knee. He had immediate pain. He went to see Dr. Raeford Razor on 6/1 who attempted to do an aspiration of the right knee joint but was unable to get much fluid out. The patient said the doctor then pushed hard all over his right leg which caused his pain and swelling to increase substantially. He tried to treat it at home with OTC pain medicine, ice, heat, and elevation. He went to ortho urgent care on 6/3 and 6/5. His knee pain and swelling continued to get worse. He was then sent to the ED for possible joint infection. He was admitted and has been on IV Vancomycin since admission. Today he says his pain and swelling are slightly better. He denies fever, chills, HA, CP, SOB, N/V.   Imaging shows Osseous demineralization with degenerative changes and small joint effusion. Chronic osteochondral irregularity at medial femoral condyle present since at least 09/17/2019. Soft tissue swelling RIGHT knee without acute fracture or dislocation.   Orthopedics was consulted for evaluation.    No history of MI, CVA, DVT, PE.  Previously ambulatory without the use of assistive devices. The patient is living at home.    Past Medical History:  Diagnosis Date  . Atrial fibrillation ([place])   . Back pain   . GERD (gastroesophageal reflux disease)   . Hyperlipidemia   . Hypertension    EKG and chest x ray 8/12 EPIC/states had stress test 2 yrs ago- doesnt remember where- not in EPIC  . Spinal stenosis    lumbar  . Thyroid trouble    Past Surgical History:  Procedure Laterality Date  . COLONOSCOPY    . CYSTOSCOPY  12/30/2011   Procedure: CYSTOSCOPY;   Surgeon: Reece Packer, MD;  Location: [facility];  Service: Urology;  Laterality: N/A;  . EYE SURGERY Bilateral 06/18/2017   cataract sx with lens implant  . HERNIA REPAIR  2013  . HERNIA REPAIR Right    age  58  . LUMBAR LAMINECTOMY/DECOMPRESSION MICRODISCECTOMY N/A 11/09/2013   Procedure: Lumbar Laminectomy/Decompression, Microdiscectomy Lumbar Three-Four, Four-Five, with Coflex;  Surgeon: Faythe Ghee, MD;  Location: [facility];  Service: Neurosurgery;  Laterality: N/A;  Lumbar Laminectomy/Decompression, Microdiscectomy Lumbar Three-Four, Four-Five, with Coflex  . PAROTIDECTOMY Left 11/17/2015   Procedure: LEFT PAROTIDECTOMY;  Surgeon: Izora Gala, MD;  Location: [facility];  Service: ENT;  Laterality: Left;  . PROSTATE SURGERY  2013  . SALIVARY GLAND SURGERY    . TONSILLECTOMY    . trigger thumb  1980   right   Social History   Socioeconomic History  . Marital status: Widowed    Spouse name: [name]  . Number of children: Not on file  . Years of education: Not on file  . Highest education level: Not on file  Occupational History  . Occupation: Retired  Tobacco Use  . Smoking status: Never Smoker  . Smokeless tobacco: Never Used  . Tobacco comment: per patient he never smoke. 11/25/2014 /rc  Vaping Use  . Vaping Use: Never used  Substance and Sexual Activity  . Alcohol use: Yes    Alcohol/week:  2.0 standard drinks    Types: 2 Standard drinks or equivalent per week    Comment: socially- occ beer  . Drug use: No  . Sexual activity: Not Currently  Other Topics Concern  . Not on file  Social History Narrative   Works 3 nights a week at [employer].    Previously worked as a Freight forwarder for [employer].   2 caffeinated drinks per day. Does not exercise he says secondary to back problems.  Lives in a one story home.     Has 2 children.  Education: some college. He is an is in [facility] from [place]   Wife passed from [place] in 2021.    Social  Determinants of Health   Financial Resource Strain: Low Risk   . Difficulty of Paying Living Expenses: Not hard at all  Food Insecurity: No Food Insecurity  . Worried About Charity fundraiser in the Last Year: Never true  . Ran Out of Food in the Last Year: Never true  Transportation Needs: No Transportation Needs  . Lack of Transportation (Medical): No  . Lack of Transportation (Non-Medical): No  Physical Activity: Insufficiently Active  . Days of Exercise per Week: 3 days  . Minutes of Exercise per Session: 30 min  Stress: No Stress Concern Present  . Feeling of Stress : Not at all  Social Connections: Moderately Integrated  . Frequency of Communication with Friends and Family: More than three times a week  . Frequency of Social Gatherings with Friends and Family: More than three times a week  . Attends Religious Services: 1 to 4 times per year  . Active Member of Clubs or Organizations: No  . Attends Archivist Meetings: 1 to 4 times per year  . Marital Status: Widowed   Family History  Problem Relation Age of Onset  . Hypertension Other   . Cancer Mother        bone?  . Myasthenia gravis Father   . Obesity Father   . Heart disease Brother    Allergies  Allergen Reactions  . Penicillins Swelling    CHILDHOOD ALLERGY Has patient had a PCN reaction causing immediate rash, facial/tongue/throat swelling, SOB or lightheadedness with hypotension: Yes Has patient had a PCN reaction causing severe rash involving mucus membranes or skin necrosis: No Has patient had a PCN reaction that required hospitalization: No Has patient had a PCN reaction occurring within the last 10 years: No If all of the above answers are "NO", then may proceed with Cephalosporin use.   . Gabapentin Other (See Comments)    "MADE ME FEEL CRAZY"  . Lisinopril Cough  . Penicillin G Hives   Prior to Admission medications   Medication Sig Start Date End Date Taking? Authorizing Provider   atorvastatin (LIPITOR) 10 MG tablet TAKE ONE TABLET BY MOUTH DAILY Patient taking differently: Take 10 mg by mouth at bedtime. 11/25/20  Yes Nafziger, Tommi Rumps, NP  doxycycline (VIBRA-TABS) 100 MG tablet Take 100 mg by mouth 2 (two) times daily. 03/20/21  Yes [provider]  ELIQUIS 5 MG TABS tablet TAKE ONE TABLET BY MOUTH TWICE A DAY Patient taking differently: Take 5 mg by mouth 2 (two) times daily. 03/02/21  Yes Hali Marry, MD  HYDROcodone-acetaminophen (NORCO/VICODIN) 5-325 MG tablet Take 1 tablet by mouth every 8 (eight) hours as needed. Patient taking differently: Take 1 tablet by mouth every 8 (eight) hours as needed for severe pain. 03/19/21  Yes  Rosemarie Ax, MD  levothyroxine (SYNTHROID) 50 MCG tablet TAKE ONE TABLET BY MOUTH DAILY Patient taking differently: Take 50 mcg by mouth every morning. 03/02/21  Yes Nafziger, Tommi Rumps, NP  losartan (COZAAR) 25 MG tablet TAKE 1 TABLET BY MOUTH DAILY Patient taking differently: Take 25 mg by mouth every morning. 11/26/20  Yes Nafziger, Tommi Rumps, NP  Multiple Vitamins-Minerals (PRESERVISION AREDS 2 PO) Take 1 capsule by mouth 2 (two) times daily.   Yes [provider]   DG Knee Complete 4 Views Right  Result Date: 03/22/2021 CLINICAL DATA:  [place] last week, progressively worsening knee pain, swelling, possible joint effusion EXAM: RIGHT KNEE - COMPLETE 4+ VIEW COMPARISON:  03/18/2021 FINDINGS: Osseous demineralization. Joint space narrowing greatest at medial compartment. Minimal chondrocalcinosis. Again identified osteochondral irregularity at the medial femoral condyle, at present since at least 09/17/2019. Diffuse soft tissue swelling especially at anterior and medial RIGHT knee region. No acute fracture, dislocation, or bone destruction. Small joint effusion. IMPRESSION: Osseous demineralization with degenerative changes and small joint effusion. Chronic osteochondral irregularity at medial femoral condyle present since at least  09/17/2019. Soft tissue swelling RIGHT knee without acute fracture or dislocation. Electronically Signed   By: Lavonia Dana M.D.   On: 03/22/2021 14:38    Positive ROS: All other systems have been reviewed and were otherwise negative with the exception of those mentioned in the HPI and as above.  Objective: Labs cbc Recent Labs    03/22/21 1618 03/23/21 0318  WBC 9.5 8.8  HGB 14.2 12.7*  HCT 42.0 37.0*  PLT 230 214    Labs inflam Recent Labs    03/22/21 1618 03/23/21 0318  CRP 5.3* 4.4*    Labs coag No results for input(s): INR, PTT in the last 72 hours.  Invalid input(s): PT  Recent Labs    03/22/21 1618 03/23/21 0318  NA 139 139  K 4.1 3.8  CL 103 106  CO2 26 28  GLUCOSE 84 109*  BUN 20 19  CREATININE 1.00 1.04  CALCIUM 8.9 8.8*    Physical Exam: Vitals:   03/23/21 0140 03/23/21 0644  BP: (!) 150/81 126/72  Pulse: 72 64  Resp: 16 16  Temp: 98.2 F (36.8 C) 97.9 F (36.6 C)  SpO2: 96% 96%   General: Alert, no acute distress. Laying in bed. Calm, comfortable Mental status: Alert and Oriented x3 Neurologic: Speech Clear and organized, no gross focal findings or movement disorder appreciated. Respiratory: No cyanosis, no use of accessory musculature Cardiovascular: RRR. RLE pedal edema GI: Abdomen is soft and non-tender, non-distended. Skin: Warm and dry.  Abrasions right olecranon and right anterior knee Extremities: Warm and well perfused  Psychiatric: Patient is competent for consent with normal mood and affect  MUSCULOSKELETAL:  RLE 2+ pitting edema at shin and ankle, circumferential knee edema, extensive ecchymosis thigh and calf, ROM 0-80, skin erythematous and warm to the touch on anterior knee and proximal shin (area outlined with marker), TTP inner right thigh, fluid wave present, can easily palpate subcutaneous fluid peripatellar, no d/c or active bleeding seen Other extremities are atraumatic with painless ROM and NVI.  Assessment /  Plan: Principal Problem:   Cellulitis of right knee Active Problems:   HYPERTENSION, BENIGN   Atrial fibrillation ([facility])   Hypothyroidism  I do not think this looks like a joint infection. This seems more likely to be a subcutaneous hematoma exacerbated by his Eliquis use. He has not had any constitutional symptoms like fever or chills. The area  of erythema and warmth is still contained within the outline drawn on the skin. No elevation in WBCs. CRP trending down.  To be sure we are not missing anything though, I will order a right knee MRI to look for hematoma vs. cellulitis vs. Septic joint. For now will treat non-operatively and continue IV ABX. Further care TBD based on MRI results.    Weightbearing: WBAT RLE Insicional and dressing care: Reinforce dressings as needed Orthopedic device(s): None Showering: would be best if he can use a shower chair for safety VTE prophylaxis: Eliquis on hold per medicine. Agree with this and recommend continuing to hold   Pain control: Tylenol, Norco, Morphine PRN  Contact information: Marchia Bond MD, [facility] PA-C  Britt Bottom PA-C Office [phone] 03/23/2021 5:25 PM

## 2021-03-23 NOTE — Progress Notes (Signed)
Patient ID: Glenn Ortega, male   DOB: 1934-01-23, 87 y.o.   MRN: [id number]  PROGRESS NOTE    CALEB DECOCK  VDI:[id number] DOB: [birth year] DOA: 03/22/2021 PCP: [name], NP   Brief Narrative:  87 y.o. male with medical history significant for atrial fibrillation on Eliquis, hypertension, and hypothyroidism presented with right knee redness, pain, swelling getting worse after a recent fall on 03/17/2021 resulting in right knee abrasions.  He was seen in sports medicine clinic recently for the same where aspiration was attempted but reportedly unsuccessful.  He was seen at an orthopedic surgery clinic where he was started on doxycycline but continued to have increasing redness, pain and swelling.  On presentation, vitals were stable.  CBC and chemistry panel was unremarkable.  ESR was 30.  Right knee x-ray was negative for acute fracture or dislocation but showed degenerative changes, small joint effusion, chronic osteochondral injury at the medial femoral condyle and soft tissue swelling.  ED provider discussed with orthopedic surgery who recommended IV antibiotics and admission to hospital.  Assessment & Plan:   Right knee cellulitis after recent fall and trauma resulting in right knee abrasions -Patient presented with worsening right knee erythema, pain and swelling despite taking doxycycline.  Prior to that, aspiration of the knee was attempted in sports medicine clinic but was unsuccessful -Right knee x-ray was negative for acute fracture or dislocation but showed degenerative changes, small joint effusion, chronic osteochondral injury at the medial femoral condyle and soft tissue swelling.  And -Orthopedics recommended admission for IV antibiotics.  Currently on vancomycin but will add Rocephin as well.  Await orthopedics evaluation and recommendations  Paroxysmal A. Fib -Currently rate controlled.  Eliquis on hold till orthopedics evaluation.  Last dose was in the AM of  03/22/2021.  Hypothyroidism -Continue Synthroid  Hypertension -Continue losartan.  Monitor blood pressure.  Hyperlipidemia -Continue statin    DVT prophylaxis: SCDs.  Eliquis on hold Code Status: Full Family Communication: None at bedside Disposition Plan: Status is: Inpatient  Remains inpatient appropriate because:Inpatient level of care appropriate due to severity of illness   Dispo: The patient is from: Home              Anticipated d/c is to: Home              Patient currently is not medically stable to d/c.   Difficult to place patient No  Consultants: Orthopedics  Procedures: None  Antimicrobials: Vancomycin from 03/22/2021 onwards   Subjective: Patient seen and examined at bedside.  Still complains of right knee swelling and pain and has not noticed much improvement.  No overnight fever or vomiting reported.  Objective: Vitals:   03/22/21 1932 03/22/21 2235 03/23/21 0140 03/23/21 0644  BP: (!) 159/77 120/69 (!) 150/81 126/72  Pulse: 71 72 72 64  Resp: _0 Temp: 97.8 F (36.6 C) 98.1 F (36.7 C) 98.2 F (36.8 C) 97.9 F (36.6 C)  TempSrc: Oral Oral Oral Oral  SpO2: 98% 98% 96% 96%    Intake/Output Summary (Last 24 hours) at 03/23/2021 1121 Last data filed at 03/23/2021 0900 Gross per 24 hour  Intake 600 ml  Output 2 ml  Net 598 ml   There were no vitals filed for this visit.  Examination:  General exam: Appears calm and comfortable  Respiratory system: Bilateral decreased breath sounds at bases Cardiovascular system: S1 & S2 heard, Rate controlled Gastrointestinal system: Abdomen is nondistended, soft and nontender. Normal bowel sounds heard.  Extremities: No cyanosis, clubbing, edema  Central nervous system: Alert and oriented. No focal neurological deficits. Moving extremities Skin: No rashes, lesions or ulcers Psychiatry: Judgement and insight appear normal. Mood & affect appropriate.  Musculoskeletal: Right knee swelling with erythema,  tenderness and decreased range of motion present.    Data Reviewed: I have personally reviewed following labs and imaging studies  CBC: Recent Labs  Lab 03/22/21 1618 03/23/21 0318  WBC 9.5 8.8  NEUTROABS 6.6  --   HGB 14.2 12.7*  HCT 42.0 37.0*  MCV 85.2 84.9  PLT 230 341   Basic Metabolic Panel: Recent Labs  Lab 03/22/21 1618 03/23/21 0318  NA 139 139  K 4.1 3.8  CL 103 106  CO2 26 28  GLUCOSE 84 109*  BUN 20 19  CREATININE 1.00 1.04  CALCIUM 8.9 8.8*   GFR: Estimated Creatinine Clearance: 56.3 mL/min (by C-G formula based on SCr of 1.04 mg/dL). Liver Function Tests: No results for input(s): AST, ALT, ALKPHOS, BILITOT, PROT, ALBUMIN in the last 168 hours. No results for input(s): LIPASE, AMYLASE in the last 168 hours. No results for input(s): AMMONIA in the last 168 hours. Coagulation Profile: No results for input(s): INR, PROTIME in the last 168 hours. Cardiac Enzymes: No results for input(s): CKTOTAL, CKMB, CKMBINDEX, TROPONINI in the last 168 hours. BNP (last 3 results) No results for input(s): PROBNP in the last 8760 hours. HbA1C: No results for input(s): HGBA1C in the last 72 hours. CBG: No results for input(s): GLUCAP in the last 168 hours. Lipid Profile: No results for input(s): CHOL, HDL, LDLCALC, TRIG, CHOLHDL, LDLDIRECT in the last 72 hours. Thyroid Function Tests: No results for input(s): TSH, T4TOTAL, FREET4, T3FREE, THYROIDAB in the last 72 hours. Anemia Panel: No results for input(s): VITAMINB12, FOLATE, FERRITIN, TIBC, IRON, RETICCTPCT in the last 72 hours. Sepsis Labs: No results for input(s): PROCALCITON, LATICACIDVEN in the last 168 hours.  Recent Results (from the past 240 hour(s))  Resp Panel by RT-PCR (Flu A&B, Covid) Nasopharyngeal Swab     Status: None   Collection Time: 03/22/21  4:18 PM   Specimen: Nasopharyngeal Swab; Nasopharyngeal(NP) swabs in vial transport medium  Result Value Ref Range Status   SARS Coronavirus 2 by RT PCR  NEGATIVE NEGATIVE Final    Comment: (NOTE) SARS-CoV-2 target nucleic acids are NOT DETECTED.  The SARS-CoV-2 RNA is generally detectable in upper respiratory specimens during the acute phase of infection. The lowest concentration of SARS-CoV-2 viral copies this assay can detect is 138 copies/mL. A negative result does not preclude SARS-Cov-2 infection and should not be used as the sole basis for treatment or other patient management decisions. A negative result may occur with  improper specimen collection/handling, submission of specimen other than nasopharyngeal swab, presence of viral mutation(s) within the areas targeted by this assay, and inadequate number of viral copies(<138 copies/mL). A negative result must be combined with clinical observations, patient history, and epidemiological information. The expected result is Negative.  Fact Sheet for Patients:  EntrepreneurPulse.com.au  Fact Sheet for Healthcare Providers:  IncredibleEmployment.be  This test is no t yet approved or cleared by the [place] FDA and  has been authorized for detection and/or diagnosis of SARS-CoV-2 by FDA under an Emergency Use Authorization (EUA). This EUA will remain  in effect (meaning this test can be used) for the duration of the COVID-19 declaration under Section 564(b)(1) of the Act, 21 U.S.C.section 360bbb-3(b)(1), unless the authorization is terminated  or revoked sooner.  Influenza A by PCR NEGATIVE NEGATIVE Final   Influenza B by PCR NEGATIVE NEGATIVE Final    Comment: (NOTE) The Xpert Xpress SARS-CoV-2/FLU/RSV plus assay is intended as an aid in the diagnosis of influenza from Nasopharyngeal swab specimens and should not be used as a sole basis for treatment. Nasal washings and aspirates are unacceptable for Xpert Xpress SARS-CoV-2/FLU/RSV testing.  Fact Sheet for Patients: EntrepreneurPulse.com.au  Fact Sheet for  Healthcare Providers: IncredibleEmployment.be  This test is not yet approved or cleared by the [place] FDA and has been authorized for detection and/or diagnosis of SARS-CoV-2 by FDA under an Emergency Use Authorization (EUA). This EUA will remain in effect (meaning this test can be used) for the duration of the COVID-19 declaration under Section 564(b)(1) of the Act, 21 U.S.C. section 360bbb-3(b)(1), unless the authorization is terminated or revoked.  Performed at [facility], [address] [place], [place] [address]          Radiology Studies: DG Knee Complete 4 Views Right  Result Date: 03/22/2021 CLINICAL DATA:  [place] last week, progressively worsening knee pain, swelling, possible joint effusion EXAM: RIGHT KNEE - COMPLETE 4+ VIEW COMPARISON:  03/18/2021 FINDINGS: Osseous demineralization. Joint space narrowing greatest at medial compartment. Minimal chondrocalcinosis. Again identified osteochondral irregularity at the medial femoral condyle, at present since at least 09/17/2019. Diffuse soft tissue swelling especially at anterior and medial RIGHT knee region. No acute fracture, dislocation, or bone destruction. Small joint effusion. IMPRESSION: Osseous demineralization with degenerative changes and small joint effusion. Chronic osteochondral irregularity at medial femoral condyle present since at least 09/17/2019. Soft tissue swelling RIGHT knee without acute fracture or dislocation. Electronically Signed   By: Lavonia Dana M.D.   On: 03/22/2021 14:38        Scheduled Meds: . atorvastatin  10 mg Oral Daily  . levothyroxine  50 mcg Oral Q0600  . losartan  25 mg Oral Daily   Continuous Infusions: . vancomycin            Aline August, MD [facility] 03/23/2021, 11:21 AM

## 2021-03-23 NOTE — Plan of Care (Signed)

## 2021-03-24 ENCOUNTER — Encounter: Payer: Self-pay | Admitting: Internal Medicine

## 2021-03-24 DIAGNOSIS — S8011XA Contusion of right lower leg, initial encounter: Secondary | ICD-10-CM

## 2021-03-24 LAB — CBC
HCT: 39.4 % (ref 39.0–52.0)
Hemoglobin: 12.9 g/dL — ABNORMAL LOW (ref 13.0–17.0)
MCH: 28.4 pg (ref 26.0–34.0)
MCHC: 32.7 g/dL (ref 30.0–36.0)
MCV: 86.6 fL (ref 80.0–100.0)
Platelets: 236 10*3/uL (ref 150–400)
RBC: 4.55 MIL/uL (ref 4.22–5.81)
RDW: 14.3 % (ref 11.5–15.5)
WBC: 9.4 10*3/uL (ref 4.0–10.5)
nRBC: 0 % (ref 0.0–0.2)

## 2021-03-24 LAB — BASIC METABOLIC PANEL
Anion gap: 8 (ref 5–15)
BUN: 23 mg/dL (ref 8–23)
CO2: 26 mmol/L (ref 22–32)
Calcium: 8.8 mg/dL — ABNORMAL LOW (ref 8.9–10.3)
Chloride: 102 mmol/L (ref 98–111)
Creatinine, Ser: 1.32 mg/dL — ABNORMAL HIGH (ref 0.61–1.24)
GFR, Estimated: 53 mL/min — ABNORMAL LOW (ref >60)
Glucose, Bld: 116 mg/dL — ABNORMAL HIGH (ref 70–99)
Potassium: 5 mmol/L (ref 3.5–5.1)
Sodium: 136 mmol/L (ref 135–145)

## 2021-03-24 LAB — MAGNESIUM: Magnesium: 1.7 mg/dL (ref 1.7–2.4)

## 2021-03-24 LAB — C-REACTIVE PROTEIN: CRP: 5.7 mg/dL — ABNORMAL HIGH (ref <1.0)

## 2021-03-24 MED ORDER — OXYCODONE HCL 5 MG PO TABS: 5.0000 mg | ORAL_TABLET | ORAL | 0 refills | Status: AC | PRN

## 2021-03-24 NOTE — Progress Notes (Signed)
Provided discharged education/instructions, all questions and concerns addressed, Pt not in distress. Pt to discharge home with belongings accompanied by daughter.

## 2021-03-24 NOTE — Plan of Care (Signed)

## 2021-03-24 NOTE — Discharge Instructions (Signed)
Okay to weight-bear as tolerated, use warm compresses for the leg to help reabsorb the hematoma, practice active straight leg raise and knee range of motion.

## 2021-03-24 NOTE — Plan of Care (Signed)

## 2021-03-24 NOTE — Progress Notes (Signed)
Patient seen and examined, he reports having had a fall directly onto the right leg with swelling, there was an attempted aspiration under ultrasound by another physician, he developed worsening swelling, he is on anticoagulation.  He denies systemic fevers, chills, or other systemic illness.  On exam he has substantial hematoma formation, this does not appear like infection to me, but rather more like hematoma.  He is got intact straight leg raise and active range of motion from about 10 degrees to 80 degrees.  MRI demonstrates hematoma, not abscess.  Impression right lower extremity hematoma formation after a traumatic fall on anticoagulation.  Plan I do not believe this is infection, I do not feel that he needs long-term antibiotics, and he can weight-bear as tolerated, use warm compresses, and could potentially be discharged later today if optimized by the medical team.  It may be valuable to hold anticoagulation for at least a couple of more days if possible just to ensure stabilization of the hematoma clot.  I will plan to see him as an outpatient in 1 week and reevaluate.  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  Marchia Bond, MD

## 2021-03-25 NOTE — Discharge Summary (Addendum)
Physician Discharge Summary   Glenn Ortega HDQ:[id number] DOB: Oct 05, 1934 DOA: 03/22/2021  PCP: [name], NP  Admit date: 03/22/2021 Discharge date: 03/24/2021   Admitted From: home Disposition:  home Discharging physician: Dwyane Dee, MD  Recommendations for Outpatient Follow-up:  1. Resume Eliquis on 03/26/21 2. Follow up with ortho   Patient discharged to home in Discharge Condition: stable Risk of unplanned readmission score: Unplanned Admission- Pilot do not use: 10.44  CODE STATUS: Full Diet recommendation:  Diet Orders (From admission, onward)    Start     Ordered   03/24/21 0000  Diet - low sodium heart healthy        03/24/21 1135          Hospital Course:  87 y.o.malewith medical history significant foratrial fibrillation on Eliquis, hypertension, and hypothyroidism presented with right knee redness, pain, swelling getting worse after a recent fall on 03/17/2021 resulting in right knee abrasions.  He was seen in sports medicine clinic recently for the same where aspiration was attempted but reportedly unsuccessful.   He was seen at an orthopedic surgery clinic where he was started on doxycycline but continued to have increasing redness, pain and swelling.  On presentation, vitals were stable.  CBC and chemistry panel was unremarkable.  ESR was 30.  Right knee x-ray was negative for acute fracture or dislocation but showed degenerative changes, small joint effusion, chronic osteochondral injury at the medial femoral condyle and soft tissue swelling.   He was admitted for orthopedic evaluation, antibiotics, and further monitoring   Right knee cellulitis after recent fall and trauma resulting in right knee abrasions Right knee hematoma 2/2 traumatic fall -Patient presented with worsening right knee erythema, pain and swelling despite taking doxycycline.  Prior to that, aspiration of the knee was attempted in sports medicine clinic but was unsuccessful -Right knee  x-ray was negative for acute fracture or dislocation but showed degenerative changes, small joint effusion, chronic osteochondral injury at the medial femoral condyle and soft tissue swelling.  And -Suspicion for septic arthritis low and antibiotics discontinued at time of discharge; he was evaluated by orthopedic surgery during hospitalization and will follow up outpatient  Paroxysmal A. Fib -Currently rate controlled.  Eliquis on hold.  Discussed with patient.  He will resume on 03/26/2021  Hypothyroidism -Continue Synthroid  Hypertension -Continue losartan.  Hyperlipidemia -Continue statin   The patient's chronic medical conditions were treated accordingly per the patient's home medication regimen except as noted.  On day of discharge, patient was felt deemed stable for discharge. Patient/family member advised to call PCP or come back to ER if needed.   Principal Diagnosis: Cellulitis of right knee  Discharge Diagnoses: Active Hospital Problems   Diagnosis Date Noted  . Cellulitis of right knee 03/22/2021  . Leg hematoma, right, initial encounter 03/24/2021  . Hypothyroidism 07/15/2020  . Atrial fibrillation ([place]) 06/17/2020  . HYPERTENSION, BENIGN 08/07/2008    Resolved Hospital Problems  No resolved problems to display.    Discharge Instructions    Diet - low sodium heart healthy   Complete by: As directed    Discharge instructions   Complete by: As directed    Resume Eliquis on 03/26/21   Increase activity slowly   Complete by: As directed      Allergies as of 03/24/2021      Reactions   Penicillins Swelling   CHILDHOOD ALLERGY Has patient had a PCN reaction causing immediate rash, facial/tongue/throat swelling, SOB or lightheadedness with hypotension: Yes Has  patient had a PCN reaction causing severe rash involving mucus membranes or skin necrosis: No Has patient had a PCN reaction that required hospitalization: No Has patient had a PCN reaction occurring within  the last 10 years: No If all of the above answers are "NO", then may proceed with Cephalosporin use.   Gabapentin Other (See Comments)   "MADE ME FEEL CRAZY"   Lisinopril Cough   Penicillin G Hives      Medication List    STOP taking these medications   doxycycline 100 MG tablet Commonly known as: VIBRA-TABS   HYDROcodone-acetaminophen 5-325 MG tablet Commonly known as: NORCO/VICODIN     TAKE these medications   atorvastatin 10 MG tablet Commonly known as: LIPITOR TAKE ONE TABLET BY MOUTH DAILY What changed: when to take this   Eliquis 5 MG Tabs tablet Generic drug: apixaban TAKE ONE TABLET BY MOUTH TWICE A DAY What changed: how much to take   levothyroxine 50 MCG tablet Commonly known as: SYNTHROID TAKE ONE TABLET BY MOUTH DAILY What changed: when to take this   losartan 25 MG tablet Commonly known as: COZAAR TAKE 1 TABLET BY MOUTH DAILY What changed:   how much to take  how to take this  when to take this  additional instructions   oxyCODONE 5 MG immediate release tablet Commonly known as: Oxy IR/ROXICODONE Take 1 tablet (5 mg total) by mouth every 4 (four) hours as needed for up to 5 days for severe pain.   PRESERVISION AREDS 2 PO Take 1 capsule by mouth 2 (two) times daily. Notes to patient: Resume home regimen       Follow-up Information    Marchia Bond, MD. Schedule an appointment as soon as possible for a visit in 1 week.   Specialty: Orthopedic Surgery Contact information: [address] [place] [address] [phone]              Allergies  Allergen Reactions  . Penicillins Swelling    CHILDHOOD ALLERGY Has patient had a PCN reaction causing immediate rash, facial/tongue/throat swelling, SOB or lightheadedness with hypotension: Yes Has patient had a PCN reaction causing severe rash involving mucus membranes or skin necrosis: No Has patient had a PCN reaction that required hospitalization: No Has patient had  a PCN reaction occurring within the last 10 years: No If all of the above answers are "NO", then may proceed with Cephalosporin use.   . Gabapentin Other (See Comments)    "MADE ME FEEL CRAZY"  . Lisinopril Cough  . Penicillin G Hives    Consultations: Orthopedic surgery  Discharge Exam: BP 120/78 (BP Location: Left Arm)   Pulse 67   Temp 98.1 F (36.7 C) (Oral)   Resp 16   SpO2 97%  General appearance: alert, cooperative and no distress Head: Normocephalic, without obvious abnormality, atraumatic Eyes: EOMI Lungs: clear to auscultation bilaterally Heart: regular rate and rhythm and S1, S2 normal Abdomen: normal findings: bowel sounds normal and soft, non-tender Extremities: Right knee noted with swelling, mild erythema.  Better range of motion active and passively appreciated Skin: mobility and turgor normal Neurologic: Grossly normal  The results of significant diagnostics from this hospitalization (including imaging, microbiology, ancillary and laboratory) are listed below for reference.   Microbiology: Recent Results (from the past 240 hour(s))  Resp Panel by RT-PCR (Flu A&B, Covid) Nasopharyngeal Swab     Status: None   Collection Time: 03/22/21  4:18 PM   Specimen: Nasopharyngeal Swab; Nasopharyngeal(NP) swabs  in vial transport medium  Result Value Ref Range Status   SARS Coronavirus 2 by RT PCR NEGATIVE NEGATIVE Final    Comment: (NOTE) SARS-CoV-2 target nucleic acids are NOT DETECTED.  The SARS-CoV-2 RNA is generally detectable in upper respiratory specimens during the acute phase of infection. The lowest concentration of SARS-CoV-2 viral copies this assay can detect is 138 copies/mL. A negative result does not preclude SARS-Cov-2 infection and should not be used as the sole basis for treatment or other patient management decisions. A negative result may occur with  improper specimen collection/handling, submission of specimen other than nasopharyngeal swab,  presence of viral mutation(s) within the areas targeted by this assay, and inadequate number of viral copies(<138 copies/mL). A negative result must be combined with clinical observations, patient history, and epidemiological information. The expected result is Negative.  Fact Sheet for Patients:  EntrepreneurPulse.com.au  Fact Sheet for Healthcare Providers:  IncredibleEmployment.be  This test is no t yet approved or cleared by the [place] FDA and  has been authorized for detection and/or diagnosis of SARS-CoV-2 by FDA under an Emergency Use Authorization (EUA). This EUA will remain  in effect (meaning this test can be used) for the duration of the COVID-19 declaration under Section 564(b)(1) of the Act, 21 U.S.C.section 360bbb-3(b)(1), unless the authorization is terminated  or revoked sooner.       Influenza A by PCR NEGATIVE NEGATIVE Final   Influenza B by PCR NEGATIVE NEGATIVE Final    Comment: (NOTE) The Xpert Xpress SARS-CoV-2/FLU/RSV plus assay is intended as an aid in the diagnosis of influenza from Nasopharyngeal swab specimens and should not be used as a sole basis for treatment. Nasal washings and aspirates are unacceptable for Xpert Xpress SARS-CoV-2/FLU/RSV testing.  Fact Sheet for Patients: EntrepreneurPulse.com.au  Fact Sheet for Healthcare Providers: IncredibleEmployment.be  This test is not yet approved or cleared by the [place] FDA and has been authorized for detection and/or diagnosis of SARS-CoV-2 by FDA under an Emergency Use Authorization (EUA). This EUA will remain in effect (meaning this test can be used) for the duration of the COVID-19 declaration under Section 564(b)(1) of the Act, 21 U.S.C. section 360bbb-3(b)(1), unless the authorization is terminated or revoked.  Performed at [facility], [address], [place], [place] [address]       Labs: BNP (last 3 results) No results for input(s): BNP in the last 8760 hours. Basic Metabolic Panel: Recent Labs  Lab 03/22/21 1618 03/23/21 0318 03/24/21 0338  NA 139 139 136  K 4.1 3.8 5.0  CL 103 106 102  CO2 26 28 26   GLUCOSE 84 109* 116*  BUN 20 19 23   CREATININE 1.00 1.04 1.32*  CALCIUM 8.9 8.8* 8.8*  MG  --   --  1.7   Liver Function Tests: No results for input(s): AST, ALT, ALKPHOS, BILITOT, PROT, ALBUMIN in the last 168 hours. No results for input(s): LIPASE, AMYLASE in the last 168 hours. No results for input(s): AMMONIA in the last 168 hours. CBC: Recent Labs  Lab 03/22/21 1618 03/23/21 0318 03/24/21 0338  WBC 9.5 8.8 9.4  NEUTROABS 6.6  --   --   HGB 14.2 12.7* 12.9*  HCT 42.0 37.0* 39.4  MCV 85.2 84.9 86.6  PLT 230 214 236   Cardiac Enzymes: No results for input(s): CKTOTAL, CKMB, CKMBINDEX, TROPONINI in the last 168 hours. BNP: Invalid input(s): POCBNP CBG: No results for input(s): GLUCAP in the last 168 hours. D-Dimer No results for input(s): DDIMER  in the last 72 hours. Hgb A1c No results for input(s): HGBA1C in the last 72 hours. Lipid Profile No results for input(s): CHOL, HDL, LDLCALC, TRIG, CHOLHDL, LDLDIRECT in the last 72 hours. Thyroid function studies No results for input(s): TSH, T4TOTAL, T3FREE, THYROIDAB in the last 72 hours.  Invalid input(s): FREET3 Anemia work up No results for input(s): VITAMINB12, FOLATE, FERRITIN, TIBC, IRON, RETICCTPCT in the last 72 hours. Urinalysis No results found for: COLORURINE, APPEARANCEUR, [place], [place], GLUCOSEU, [place], [place], [place], PROTEINUR, UROBILINOGEN, NITRITE, LEUKOCYTESUR Sepsis Labs Invalid input(s): PROCALCITONIN,  WBC,  LACTICIDVEN Microbiology Recent Results (from the past 240 hour(s))  Resp Panel by RT-PCR (Flu A&B, Covid) Nasopharyngeal Swab     Status: None   Collection Time: 03/22/21  4:18 PM   Specimen: Nasopharyngeal Swab; Nasopharyngeal(NP) swabs in vial  transport medium  Result Value Ref Range Status   SARS Coronavirus 2 by RT PCR NEGATIVE NEGATIVE Final    Comment: (NOTE) SARS-CoV-2 target nucleic acids are NOT DETECTED.  The SARS-CoV-2 RNA is generally detectable in upper respiratory specimens during the acute phase of infection. The lowest concentration of SARS-CoV-2 viral copies this assay can detect is 138 copies/mL. A negative result does not preclude SARS-Cov-2 infection and should not be used as the sole basis for treatment or other patient management decisions. A negative result may occur with  improper specimen collection/handling, submission of specimen other than nasopharyngeal swab, presence of viral mutation(s) within the areas targeted by this assay, and inadequate number of viral copies(<138 copies/mL). A negative result must be combined with clinical observations, patient history, and epidemiological information. The expected result is Negative.  Fact Sheet for Patients:  EntrepreneurPulse.com.au  Fact Sheet for Healthcare Providers:  IncredibleEmployment.be  This test is no t yet approved or cleared by the [place] FDA and  has been authorized for detection and/or diagnosis of SARS-CoV-2 by FDA under an Emergency Use Authorization (EUA). This EUA will remain  in effect (meaning this test can be used) for the duration of the COVID-19 declaration under Section 564(b)(1) of the Act, 21 U.S.C.section 360bbb-3(b)(1), unless the authorization is terminated  or revoked sooner.       Influenza A by PCR NEGATIVE NEGATIVE Final   Influenza B by PCR NEGATIVE NEGATIVE Final    Comment: (NOTE) The Xpert Xpress SARS-CoV-2/FLU/RSV plus assay is intended as an aid in the diagnosis of influenza from Nasopharyngeal swab specimens and should not be used as a sole basis for treatment. Nasal washings and aspirates are unacceptable for Xpert Xpress SARS-CoV-2/FLU/RSV testing.  Fact  Sheet for Patients: EntrepreneurPulse.com.au  Fact Sheet for Healthcare Providers: IncredibleEmployment.be  This test is not yet approved or cleared by the [place] FDA and has been authorized for detection and/or diagnosis of SARS-CoV-2 by FDA under an Emergency Use Authorization (EUA). This EUA will remain in effect (meaning this test can be used) for the duration of the COVID-19 declaration under Section 564(b)(1) of the Act, 21 U.S.C. section 360bbb-3(b)(1), unless the authorization is terminated or revoked.  Performed at [facility], [address], [place], [place] [address]     Procedures/Studies: DG ELBOW COMPLETE RIGHT (3+VIEW)  Result Date: 03/18/2021 CLINICAL DATA:  Recent fall with right elbow pain, initial encounter EXAM: RIGHT ELBOW - COMPLETE 3+ VIEW COMPARISON:  None. FINDINGS: There is no evidence of fracture, dislocation, or joint effusion. There is no evidence of arthropathy or other focal bone abnormality. Soft tissues are unremarkable. IMPRESSION: No acute abnormality noted. Electronically Signed  By: Inez Catalina M.D.   On: 03/18/2021 21:17   MR KNEE RIGHT WO CONTRAST  Result Date: 03/24/2021 CLINICAL DATA:  Right knee pain and swelling since fall on 03/17/2021 EXAM: MRI OF THE RIGHT KNEE WITHOUT CONTRAST TECHNIQUE: Multiplanar, multisequence MR imaging of the knee was performed. No intravenous contrast was administered. COMPARISON:  X-ray 03/22/2021 FINDINGS: MENISCI Medial meniscus: Complex degenerative appearing tearing of the medial meniscal body and posterior horn. Small flap of meniscal tissue displaced into the inferior gutter. Lateral meniscus: Extensive intrasubstance degeneration of the lateral meniscus with oblique tear extending to the free edge and undersurface at the posterior horn-body junction, which likely extends into the body segment. LIGAMENTS Cruciates:  Intact ACL and PCL. Collaterals:  Medial collateral ligament is intact. Lateral collateral ligament complex is intact. CARTILAGE Patellofemoral: Partial-thickness chondral irregularity within the trochlear groove. No patellar chondral defect. Medial: 12 mm osteochondral impaction deformity of the central weight-bearing medial femoral condyle with associated partial cartilage delamination (series 10, image 14). Small full-thickness cartilage defect involving the more peripheral aspect of the medial tibial plateau with underlying subchondral marrow edema (series 8, image 13). Lateral:  Mild partial-thickness cartilage surface irregularity. Joint: Small-moderate knee joint effusion without layering fat-fluid or hematocrit level. Mild edema within Hoffa's fat. Popliteal Fossa:  No Baker cyst. Intact popliteus tendon. Extensor Mechanism:  Intact quadriceps tendon and patellar tendon. Bones: Bone marrow edema within the anterolateral aspect of the proximal tibia (series 8, images 25-26) with possible subtle nondisplaced trabecular fracture (series 6, images 20-21). No definite involvement of the articular surface of the lateral tibial plateau. Subchondral marrow signal changes within the medial and patellofemoral compartments with possible developing subchondral fracture at the posterior aspect of the medial tibial plateau (series 9, image 14). Other: Fluid collection within the prepatellar soft tissues measuring approximately 6.8 x 2.6 x 7.7 cm with fluid-fluid levels. Additional areas of more ill-defined complex fluid at the anteromedial aspect of the knee and anterior to the tibial tuberosity with mixed T1 and T2 signal intensity suggesting hematomas. Diffuse circumferential subcutaneous edema. Advanced tendinosis of the distal semimembranosus tendon at its tibial attachment site. IMPRESSION: 1. Multiple fluid collections at the anterior and anteromedial aspect of the right knee, largest within the prepatellar region measuring up to 7.7 cm. Findings  are most suggestive of hematomas. 2. Small-to-moderate size knee joint effusion, favored degenerative/reactive. If there is clinical concern for septic arthritis, arthrocentesis should be performed. 3. Bone marrow edema within the anterolateral aspect of the proximal tibia with possible subtle nondisplaced trabecular fracture, favored posttraumatic. 4. Tricompartmental osteoarthritis of the right knee, most pronounced within the medial compartment. 5. Complex degenerative appearing tearing of the medial meniscal body and posterior horn. 6. Extensive intrasubstance degeneration of the lateral meniscus with oblique tear extending to the free edge and undersurface at the posterior horn-body junction, which likely extends into the body segment. 7. Possible developing subchondral fracture at the posterior aspect of the medial tibial plateau. Electronically Signed   By: Davina Poke D.O.   On: 03/24/2021 08:22   [place] LIMITED JOINT SPACE STRUCTURES LOW RIGHT  Result Date: 03/24/2021 Limited ultrasound: Right knee:  Trace effusion. Normal-appearing quadricep patellar tendon. Large hematoma in the soft tissue from the medial compartment extending Laterally. Moderate degenerative changes of the medial joint space.  Summary: Hematoma apparent from trauma.  Ultrasound and interpretation by Clearance Coots, MD   Aspiration/Injection Procedure Note DARIN ARNDT [birth year]  Procedure: Aspiration Indications: Right knee pain  Procedure Details Consent:  Risks of procedure as well as the alternatives and risks of each were explained to the (patient/caregiver).  Consent for procedure obtained. Time Out: Verified patient identification, verified procedure, site/side was marked, verified correct patient position, special equipment/implants available, medications/allergies/relevent history reviewed, required imaging and test results available.  Performed.  The area was cleaned with iodine and alcohol swabs.   The right medial  knee was injected using 4 cc's of 1% lidocaine with a 22 1 1/2" needle.  An 18-gauge 1-1/2 inch needle was used to achieve aspiration.  Ultrasound was used. Images were obtained in long views showing the injection.   Amount of Fluid Aspirated: minimal amount Character of Fluid: bloody Fluid was sent for:n/a  A sterile dressing was applied.  Patient did tolerate procedure well.  DG Knee Complete 4 Views Right  Result Date: 03/22/2021 CLINICAL DATA:  [place] last week, progressively worsening knee pain, swelling, possible joint effusion EXAM: RIGHT KNEE - COMPLETE 4+ VIEW COMPARISON:  03/18/2021 FINDINGS: Osseous demineralization. Joint space narrowing greatest at medial compartment. Minimal chondrocalcinosis. Again identified osteochondral irregularity at the medial femoral condyle, at present since at least 09/17/2019. Diffuse soft tissue swelling especially at anterior and medial RIGHT knee region. No acute fracture, dislocation, or bone destruction. Small joint effusion. IMPRESSION: Osseous demineralization with degenerative changes and small joint effusion. Chronic osteochondral irregularity at medial femoral condyle present since at least 09/17/2019. Soft tissue swelling RIGHT knee without acute fracture or dislocation. Electronically Signed   By: Lavonia Dana M.D.   On: 03/22/2021 14:38   DG Knee Complete 4 Views Right  Result Date: 03/18/2021 CLINICAL DATA:  Right knee pain following fall, initial encounter EXAM: RIGHT KNEE - COMPLETE 4+ VIEW COMPARISON:  None. FINDINGS: Mild degenerative changes are noted in the medial and patellofemoral joint space. No acute fracture or dislocation is seen. No joint effusion is noted. IMPRESSION: Degenerative change without acute abnormality. Electronically Signed   By: Inez Catalina M.D.   On: 03/18/2021 21:17     Time coordinating discharge: Over 30 minutes    Dwyane Dee, MD  [facility] 03/25/2021, 5:05 PM

## 2021-03-26 ENCOUNTER — Telehealth: Payer: Self-pay

## 2021-03-26 NOTE — Telephone Encounter (Signed)
Transition Care Management Unsuccessful Follow-up Telephone Call  Date of discharge and from where: 03/24/2021  [facility]   Attempts:  1st Attempt  Reason for unsuccessful TCM follow-up call:  No answer/busy

## 2021-03-31 ENCOUNTER — Ambulatory Visit: Payer: PPO | Admitting: Family Medicine

## 2021-04-01 DIAGNOSIS — S8011XD Contusion of right lower leg, subsequent encounter: Secondary | ICD-10-CM | POA: Diagnosis not present

## 2021-04-13 ENCOUNTER — Ambulatory Visit: Payer: PPO | Admitting: Family Medicine

## 2021-04-29 DIAGNOSIS — G5601 Carpal tunnel syndrome, right upper limb: Secondary | ICD-10-CM | POA: Diagnosis not present

## 2021-04-29 DIAGNOSIS — S8011XD Contusion of right lower leg, subsequent encounter: Secondary | ICD-10-CM | POA: Diagnosis not present

## 2021-05-27 DIAGNOSIS — G5603 Carpal tunnel syndrome, bilateral upper limbs: Secondary | ICD-10-CM | POA: Diagnosis not present

## 2021-06-10 DIAGNOSIS — G5603 Carpal tunnel syndrome, bilateral upper limbs: Secondary | ICD-10-CM | POA: Diagnosis not present

## 2021-06-15 ENCOUNTER — Telehealth: Payer: Self-pay | Admitting: Interventional Cardiology

## 2021-06-15 DIAGNOSIS — I4891 Unspecified atrial fibrillation: Secondary | ICD-10-CM

## 2021-06-15 MED ORDER — APIXABAN 5 MG PO TABS
5.0000 mg | ORAL_TABLET | Freq: Two times a day (BID) | ORAL | 1 refills | Status: DC
Start: 2021-06-15 — End: 2022-03-22

## 2021-06-15 MED ORDER — APIXABAN 5 MG PO TABS: 5.0000 mg | ORAL_TABLET | Freq: Two times a day (BID) | ORAL | 5 refills | Status: DC

## 2021-06-15 NOTE — Telephone Encounter (Signed)
Pt last saw Dr Irish Lack 07/24/20, last labs 03/24/21 Creat 1.32, age 87, weight 92.5kg, based on specified criteria pt is on appropriate dosage of Eliquis '5mg'$  BID for afib.  Will refill rx.

## 2021-06-15 NOTE — Addendum Note (Signed)
Addended by: Brynda Peon on: 06/15/2021 10:22 AM   Modules accepted: Orders

## 2021-06-15 NOTE — Telephone Encounter (Signed)
 *  STAT* If patient is at the pharmacy, call can be transferred to refill team.   1. Which medications need to be refilled? (please list name of each medication and dose if known)   ELIQUIS 5 MG TABS tablet  2. Which pharmacy/location (including street and city if local pharmacy) is medication to be sent to?  [facility] CV:[id number] - [place], [place]  3. Do they need a 30 day or 90 day supply? 90 day

## 2021-06-16 ENCOUNTER — Telehealth: Payer: Self-pay | Admitting: Interventional Cardiology

## 2021-06-16 NOTE — Telephone Encounter (Signed)
New Message:      Patient would like to switch from Dr Irish Lack service to Dr Claiborne Billings service please. Patient said he wife was a patient of Dr Claiborne Billings. Is this alright with both of you?

## 2021-06-18 ENCOUNTER — Encounter: Payer: Self-pay | Admitting: Neurology

## 2021-06-18 NOTE — Telephone Encounter (Signed)
ok 

## 2021-06-27 ENCOUNTER — Emergency Department (HOSPITAL_COMMUNITY): Payer: PPO

## 2021-06-27 ENCOUNTER — Other Ambulatory Visit: Payer: Self-pay

## 2021-06-27 ENCOUNTER — Observation Stay (HOSPITAL_COMMUNITY)
Admission: EM | Admit: 2021-06-27 | Discharge: 2021-06-29 | Disposition: A | Discharge status: Home / Self Care | Payer: PPO | Attending: Emergency Medicine | Admitting: Emergency Medicine

## 2021-06-27 ENCOUNTER — Encounter (HOSPITAL_COMMUNITY): Payer: Self-pay | Admitting: Emergency Medicine

## 2021-06-27 DIAGNOSIS — M542 Cervicalgia: Secondary | ICD-10-CM | POA: Diagnosis not present

## 2021-06-27 DIAGNOSIS — E876 Hypokalemia: Secondary | ICD-10-CM | POA: Diagnosis present

## 2021-06-27 DIAGNOSIS — Z7901 Long term (current) use of anticoagulants: Secondary | ICD-10-CM | POA: Diagnosis not present

## 2021-06-27 DIAGNOSIS — U071 COVID-19: Secondary | ICD-10-CM | POA: Insufficient documentation

## 2021-06-27 DIAGNOSIS — Y900 Blood alcohol level of less than 20 mg/100 ml: Secondary | ICD-10-CM | POA: Insufficient documentation

## 2021-06-27 DIAGNOSIS — Z79899 Other long term (current) drug therapy: Secondary | ICD-10-CM | POA: Diagnosis not present

## 2021-06-27 DIAGNOSIS — Y92009 Unspecified place in unspecified non-institutional (private) residence as the place of occurrence of the external cause: Secondary | ICD-10-CM | POA: Diagnosis not present

## 2021-06-27 DIAGNOSIS — R4781 Slurred speech: Secondary | ICD-10-CM | POA: Diagnosis not present

## 2021-06-27 DIAGNOSIS — E039 Hypothyroidism, unspecified: Secondary | ICD-10-CM | POA: Diagnosis not present

## 2021-06-27 DIAGNOSIS — I480 Paroxysmal atrial fibrillation: Secondary | ICD-10-CM | POA: Diagnosis not present

## 2021-06-27 DIAGNOSIS — E782 Mixed hyperlipidemia: Secondary | ICD-10-CM | POA: Diagnosis present

## 2021-06-27 DIAGNOSIS — W19XXXA Unspecified fall, initial encounter: Secondary | ICD-10-CM

## 2021-06-27 DIAGNOSIS — I4891 Unspecified atrial fibrillation: Secondary | ICD-10-CM | POA: Diagnosis not present

## 2021-06-27 DIAGNOSIS — R531 Weakness: Secondary | ICD-10-CM | POA: Diagnosis not present

## 2021-06-27 DIAGNOSIS — R42 Dizziness and giddiness: Secondary | ICD-10-CM | POA: Insufficient documentation

## 2021-06-27 DIAGNOSIS — I10 Essential (primary) hypertension: Secondary | ICD-10-CM | POA: Insufficient documentation

## 2021-06-27 DIAGNOSIS — M25511 Pain in right shoulder: Principal | ICD-10-CM | POA: Insufficient documentation

## 2021-06-27 DIAGNOSIS — J189 Pneumonia, unspecified organism: Secondary | ICD-10-CM

## 2021-06-27 DIAGNOSIS — R059 Cough, unspecified: Secondary | ICD-10-CM | POA: Diagnosis present

## 2021-06-27 LAB — CBC WITH DIFFERENTIAL/PLATELET
Abs Immature Granulocytes: 0.07 10*3/uL (ref 0.00–0.07)
Basophils Absolute: 0 10*3/uL (ref 0.0–0.1)
Basophils Relative: 0 %
Eosinophils Absolute: 0 10*3/uL (ref 0.0–0.5)
Eosinophils Relative: 0 %
HCT: 45.1 % (ref 39.0–52.0)
Hemoglobin: 14.7 g/dL (ref 13.0–17.0)
Immature Granulocytes: 1 %
Lymphocytes Relative: 16 %
Lymphs Abs: 1.1 10*3/uL (ref 0.7–4.0)
MCH: 27.9 pg (ref 26.0–34.0)
MCHC: 32.6 g/dL (ref 30.0–36.0)
MCV: 85.7 fL (ref 80.0–100.0)
Monocytes Absolute: 1.1 10*3/uL — ABNORMAL HIGH (ref 0.1–1.0)
Monocytes Relative: 17 %
Neutro Abs: 4.3 10*3/uL (ref 1.7–7.7)
Neutrophils Relative %: 66 %
Platelets: 156 10*3/uL (ref 150–400)
RBC: 5.26 MIL/uL (ref 4.22–5.81)
RDW: 15.1 % (ref 11.5–15.5)
WBC: 6.6 10*3/uL (ref 4.0–10.5)
nRBC: 0 % (ref 0.0–0.2)

## 2021-06-27 LAB — ETHANOL: Alcohol, Ethyl (B): <10 mg/dL (ref <10)

## 2021-06-27 LAB — COMPREHENSIVE METABOLIC PANEL
ALT: 12 U/L (ref 0–44)
AST: 17 U/L (ref 15–41)
Albumin: 3.7 g/dL (ref 3.5–5.0)
Alkaline Phosphatase: 35 U/L — ABNORMAL LOW (ref 38–126)
Anion gap: 11 (ref 5–15)
BUN: 18 mg/dL (ref 8–23)
CO2: 20 mmol/L — ABNORMAL LOW (ref 22–32)
Calcium: 8.4 mg/dL — ABNORMAL LOW (ref 8.9–10.3)
Chloride: 105 mmol/L (ref 98–111)
Creatinine, Ser: 1.02 mg/dL (ref 0.61–1.24)
GFR, Estimated: >60 mL/min (ref >60)
Glucose, Bld: 97 mg/dL (ref 70–99)
Potassium: 3.4 mmol/L — ABNORMAL LOW (ref 3.5–5.1)
Sodium: 136 mmol/L (ref 135–145)
Total Bilirubin: 1 mg/dL (ref 0.3–1.2)
Total Protein: 6.7 g/dL (ref 6.5–8.1)

## 2021-06-27 NOTE — ED Triage Notes (Signed)
Patient arrives with family. Patient has had slurred speech for a few days, but today fell in the shower and had a bout of incontinence. Unable to get clear story of last known well.

## 2021-06-27 NOTE — ED Provider Notes (Signed)
[facility] DEPT Provider Note   CSN: [id number] Arrival date & time: 06/27/21  2205     History Chief Complaint  Patient presents with   Weakness    Glenn Ortega is a 87 y.o. male.  Patient with history of atrial fibrillation, HTN, HLD, presents with son who assists with history. For the past 3 weeks, the patient has been having "dizzy spells" described as a loss of sense of balance, "feels like I'm falling backward". No headache. He and his son were in [place] earlier when he felt his legs became weak causing multiple falls. No injury from the falls. No syncope. He denies chest pain, SOB, nausea, vomiting, urinary symptoms. He reports he has been out of his Eliquis for the past 3 weeks secondary to financial constraints. No fever, cough, congestion. His son reports he had periods of confusion today described as "would say something completely out of context to their conversation." Also c/o right shoulder pain without c/o neck pain. No numbness or weakness of the right UE.   The history is provided by the patient. No language interpreter was used.  Weakness Associated symptoms: dizziness (See HPI)   Associated symptoms: no abdominal pain, no chest pain, no dysuria, no fever, no nausea, no shortness of breath and no vomiting       Past Medical History:  Diagnosis Date   Atrial fibrillation (HCC)    Back pain    GERD (gastroesophageal reflux disease)    Hyperlipidemia    Hypertension    EKG and chest x ray 8/12 [facility]/states had stress test 2 yrs ago- doesnt remember where- not in [facility]   Spinal stenosis    lumbar   Thyroid trouble     Patient Active Problem List   Diagnosis Date Noted   Leg hematoma, right, initial encounter 03/24/2021   Cellulitis of right knee 03/22/2021   Hematoma 03/19/2021   Right elbow pain 03/19/2021   Acute bilateral low back pain without sciatica 11/12/2020   Essential hypertension 08/18/2020   Depressed mood,  not depression 08/18/2020   Senile purpura ([place]) 08/14/2020   Vitamin D deficiency 07/29/2020   Hypothyroidism 07/15/2020   Hyperglycemia 07/15/2020   BMI 38.0-38.9,adult 06/18/2020   Bilateral lower extremity edema 06/18/2020   Atrial fibrillation ([place]) 06/17/2020   BPPV (benign paroxysmal positional vertigo) 06/17/2020   Primary osteoarthritis of left knee 09/17/2019   Hyperlipidemia 09/27/2018   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 05/03/2016   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   Parotid mass 11/17/2015   Carpal tunnel syndrome, bilateral 08/18/2015   Unspecified hereditary and idiopathic peripheral neuropathy 06/18/2014   Lumbar spinal stenosis 11/09/2013   ESOPHAGEAL REFLUX 04/10/2010   HYPERTENSION, PULMONARY 04/07/2010   UNSPECIFIED HEARING LOSS 08/07/2008   HYPERTENSION, BENIGN 08/07/2008   BPH (benign prostatic hyperplasia) 08/07/2008   SCIATICA 08/07/2008    Past Surgical History:  Procedure Laterality Date   COLONOSCOPY     CYSTOSCOPY  12/30/2011   Procedure: CYSTOSCOPY;  Surgeon: Reece Packer, MD;  Location: [facility];  Service: Urology;  Laterality: N/A;   EYE SURGERY Bilateral 06/18/2017   cataract sx with lens implant   HERNIA REPAIR  2013   HERNIA REPAIR Right    age  82   LUMBAR LAMINECTOMY/DECOMPRESSION MICRODISCECTOMY N/A 11/09/2013   Procedure: Lumbar Laminectomy/Decompression, Microdiscectomy Lumbar Three-Four, Four-Five, with Coflex;  Surgeon: Faythe Ghee, MD;  Location: [facility];  Service: Neurosurgery;  Laterality: N/A;  Lumbar Laminectomy/Decompression, Microdiscectomy Lumbar Three-Four,  Four-Five, with Coflex   PAROTIDECTOMY Left 11/17/2015   Procedure: LEFT PAROTIDECTOMY;  Surgeon: Izora Gala, MD;  Location: [facility];  Service: ENT;  Laterality: Left;   PROSTATE SURGERY  2013   SALIVARY GLAND SURGERY     TONSILLECTOMY     trigger thumb  1980   right       Family History  Problem Relation Age of Onset   Hypertension  Other    Cancer Mother        bone?   Myasthenia gravis Father    Obesity Father    Heart disease Brother     Social History   Tobacco Use   Smoking status: Never   Smokeless tobacco: Never   Tobacco comments:    per patient he never smoke. 11/25/2014 /rc  Vaping Use   Vaping Use: Never used  Substance Use Topics   Alcohol use: Yes    Alcohol/week: 2.0 standard drinks    Types: 2 Standard drinks or equivalent per week    Comment: socially- occ beer   Drug use: No    Home Medications Prior to Admission medications   Medication Sig Start Date End Date Taking? Authorizing Provider  apixaban (ELIQUIS) 5 MG TABS tablet Take 1 tablet (5 mg total) by mouth 2 (two) times daily. 06/15/21  Yes Jettie Booze, MD  atorvastatin (LIPITOR) 10 MG tablet TAKE ONE TABLET BY MOUTH DAILY Patient taking differently: Take 10 mg by mouth at bedtime. 11/25/20  Yes Nafziger, Tommi Rumps, NP  levothyroxine (SYNTHROID) 50 MCG tablet TAKE ONE TABLET BY MOUTH DAILY Patient taking differently: Take 50 mcg by mouth every morning. 03/02/21  Yes Nafziger, Tommi Rumps, NP  losartan (COZAAR) 25 MG tablet TAKE 1 TABLET BY MOUTH DAILY Patient taking differently: Take 25 mg by mouth every morning. 11/26/20  Yes Nafziger, Tommi Rumps, NP  Multiple Vitamins-Minerals (PRESERVISION AREDS 2 PO) Take 1 capsule by mouth 2 (two) times daily.   Yes [provider]    Allergies    Penicillins, Gabapentin, Lisinopril, and Penicillin g  Review of Systems   Review of Systems  Constitutional:  Negative for chills, diaphoresis and fever.  HENT: Negative.    Eyes:  Negative for visual disturbance.  Respiratory: Negative.  Negative for shortness of breath.   Cardiovascular: Negative.  Negative for chest pain, palpitations and leg swelling.  Gastrointestinal: Negative.  Negative for abdominal pain, nausea and vomiting.  Genitourinary:  Negative for dysuria.  Musculoskeletal: Negative.  Negative for back pain and neck pain.        C/O right shoulder pain  Skin: Negative.  Negative for wound.  Neurological:  Positive for dizziness (See HPI) and weakness (Bilateral LE's). Negative for facial asymmetry and speech difficulty.  Psychiatric/Behavioral:  Positive for confusion.    Physical Exam Updated Vital Signs BP 137/81   Pulse 73   Temp 98.9 F (37.2 C) (Oral)   Resp 16   Ht '5\' 6"'$  (1.676 m)   Wt 90.7 kg   SpO2 98%   BMI 32.28 kg/m   Physical Exam Vitals and nursing note reviewed.  Constitutional:      General: He is not in acute distress.    Appearance: Normal appearance. He is well-developed. He is obese. He is not ill-appearing.  HENT:     Head: Normocephalic and atraumatic.     Mouth/Throat:     Mouth: Mucous membranes are moist.  Eyes:     Pupils: Pupils are equal, round, and reactive to light.  Neck:     Vascular: No carotid bruit.  Cardiovascular:     Rate and Rhythm: Normal rate and regular rhythm.     Heart sounds: No murmur heard. Pulmonary:     Effort: Pulmonary effort is normal.     Breath sounds: Normal breath sounds. No wheezing, rhonchi or rales.  Abdominal:     General: Bowel sounds are normal.     Palpations: Abdomen is soft.     Tenderness: There is no abdominal tenderness. There is no guarding or rebound.  Musculoskeletal:        General: Normal range of motion.     Cervical back: Normal range of motion and neck supple.     Comments: Bilateral LE's have normal strength. No significant edema.   Skin:    General: Skin is warm and dry.  Neurological:     General: No focal deficit present.     Mental Status: He is alert and oriented to person, place, and time.     GCS: GCS eye subscore is 4. GCS verbal subscore is 5. GCS motor subscore is 6.     Cranial Nerves: No dysarthria or facial asymmetry.     Sensory: No sensory deficit.     Motor: Motor function is intact. No weakness, abnormal muscle tone or pronator drift.     Coordination: Finger-Nose-Finger Test normal.      Comments: CN's 3-12 grossly intact    ED Results / Procedures / Treatments   Labs (all labs ordered are listed, but only abnormal results are displayed) Labs Reviewed  CBC WITH DIFFERENTIAL/PLATELET - Abnormal; Notable for the following components:      Result Value   Monocytes Absolute 1.1 (*)    All other components within normal limits  COMPREHENSIVE METABOLIC PANEL - Abnormal; Notable for the following components:   Potassium 3.4 (*)    CO2 20 (*)    Calcium 8.4 (*)    Alkaline Phosphatase 35 (*)    All other components within normal limits  URINALYSIS, ROUTINE W REFLEX MICROSCOPIC - Abnormal; Notable for the following components:   Color, Urine YELLOW (*)    APPearance CLEAR (*)    Hgb urine dipstick SMALL (*)    Protein, ur 30 (*)    Bacteria, UA RARE (*)    All other components within normal limits  ETHANOL   Results for orders placed or performed during the hospital encounter of 06/27/21  CBC with Differential  Result Value Ref Range   WBC 6.6 4.0 - 10.5 K/uL   RBC 5.26 4.22 - 5.81 MIL/uL   Hemoglobin 14.7 13.0 - 17.0 g/dL   HCT 45.1 39.0 - 52.0 %   MCV 85.7 80.0 - 100.0 fL   MCH 27.9 26.0 - 34.0 pg   MCHC 32.6 30.0 - 36.0 g/dL   RDW 15.1 11.5 - 15.5 %   Platelets 156 150 - 400 K/uL   nRBC 0.0 0.0 - 0.2 %   Neutrophils Relative % 66 %   Neutro Abs 4.3 1.7 - 7.7 K/uL   Lymphocytes Relative 16 %   Lymphs Abs 1.1 0.7 - 4.0 K/uL   Monocytes Relative 17 %   Monocytes Absolute 1.1 (H) 0.1 - 1.0 K/uL   Eosinophils Relative 0 %   Eosinophils Absolute 0.0 0.0 - 0.5 K/uL   Basophils Relative 0 %   Basophils Absolute 0.0 0.0 - 0.1 K/uL   Immature Granulocytes 1 %   Abs Immature Granulocytes 0.07 0.00 - 0.07 K/uL  Comprehensive metabolic panel  Result Value Ref Range   Sodium 136 135 - 145 mmol/L   Potassium 3.4 (L) 3.5 - 5.1 mmol/L   Chloride 105 98 - 111 mmol/L   CO2 20 (L) 22 - 32 mmol/L   Glucose, Bld 97 70 - 99 mg/dL   BUN 18 8 - 23 mg/dL   Creatinine, Ser  1.02 0.61 - 1.24 mg/dL   Calcium 8.4 (L) 8.9 - 10.3 mg/dL   Total Protein 6.7 6.5 - 8.1 g/dL   Albumin 3.7 3.5 - 5.0 g/dL   AST 17 15 - 41 U/L   ALT 12 0 - 44 U/L   Alkaline Phosphatase 35 (L) 38 - 126 U/L   Total Bilirubin 1.0 0.3 - 1.2 mg/dL   GFR, Estimated >60 >60 mL/min   Anion gap 11 5 - 15  Ethanol  Result Value Ref Range   Alcohol, Ethyl (B) <10 <10 mg/dL  Urinalysis, Routine w reflex microscopic  Result Value Ref Range   Color, Urine YELLOW (A) YELLOW   APPearance CLEAR (A) CLEAR   Specific Gravity, Urine >=1.030 1.005 - 1.030   pH 5.5 5.0 - 8.0   Glucose, UA NEGATIVE NEGATIVE mg/dL   Hgb urine dipstick SMALL (A) NEGATIVE   Bilirubin Urine NEGATIVE NEGATIVE   Ketones, ur NEGATIVE NEGATIVE mg/dL   Protein, ur 30 (A) NEGATIVE mg/dL   Nitrite NEGATIVE NEGATIVE   Leukocytes,Ua NEGATIVE NEGATIVE   RBC / HPF 0-5 0 - 5 RBC/hpf   WBC, UA 0-5 0 - 5 WBC/hpf   Bacteria, UA RARE (A) NONE SEEN   Mucus PRESENT     EKG EKG Interpretation  Date/Time:  Saturday June 27 2021 22:22:06 EDT Ventricular Rate:  71 PR Interval:    QRS Duration: 104 QT Interval:  408 QTC Calculation: 444 R Axis:   -9 Text Interpretation: Atrial fibrillation Inferior infarct, old No significant change since last tracing Confirmed by Wandra Arthurs [phone]) on 06/27/2021 11:06:42 PM  Radiology CT HEAD WO CONTRAST (5MM)  Result Date: 06/27/2021 CLINICAL DATA:  Slurred speech for several days, fell, incontinence EXAM: CT HEAD WITHOUT CONTRAST TECHNIQUE: Contiguous axial images were obtained from the base of the skull through the vertex without intravenous contrast. COMPARISON:  06/14/2017 FINDINGS: Brain: No acute infarct or hemorrhage. Lateral ventricles and midline structures are unremarkable. No acute extra-axial fluid collections. No mass effect. Vascular: No hyperdense vessel or unexpected calcification. Skull: Normal. Negative for fracture or focal lesion. Sinuses/Orbits: No acute finding. Other:  None. IMPRESSION: 1. No acute intracranial process. Electronically Signed   By: Randa Ngo M.D.   On: 06/27/2021 23:41    Procedures Procedures   Medications Ordered in ED Medications  sodium chloride 0.9 % bolus 500 mL (500 mLs Intravenous New Bag/Given 06/28/21 0040)    ED Course  I have reviewed the triage vital signs and the nursing notes.  Pertinent labs & imaging results that were available during my care of the patient were reviewed by me and considered in my medical decision making (see chart for details).    MDM Rules/Calculators/A&P                           Patient to ED with ss/sxs as detailed in the HPI.   Patient is awake, alert, oriented during ED encounter. No weakness appreciated on exam. Ambulation is deferred for now.   Head CT negative. Labs reassuring. The patient is ambulated and found to  favor left leg with the appearance of weakness. Steady, balanced. EKG c/w a-fib, regular rate.  Tele-neurology consult requested. On their assessment, concern for stroke vs seizure. Recommends admission for morning MRI, EEG. Hospitalist paged. Eliquis is re-started per neurology.   Discussed with Dr. Cyd Silence, [facility], who accepts for admission.   Final Clinical Impression(s) / ED Diagnoses Final diagnoses:  None   Weakness  Rx / DC Orders ED Discharge Orders     None        Dennie Bible 06/28/21 [id number]    Mesner, Corene Cornea, MD 06/29/21 0001

## 2021-06-28 ENCOUNTER — Observation Stay (HOSPITAL_BASED_OUTPATIENT_CLINIC_OR_DEPARTMENT_OTHER): Payer: PPO

## 2021-06-28 ENCOUNTER — Observation Stay (HOSPITAL_COMMUNITY): Payer: PPO

## 2021-06-28 ENCOUNTER — Encounter (HOSPITAL_COMMUNITY): Payer: Self-pay | Admitting: Internal Medicine

## 2021-06-28 DIAGNOSIS — E876 Hypokalemia: Secondary | ICD-10-CM | POA: Diagnosis not present

## 2021-06-28 DIAGNOSIS — E782 Mixed hyperlipidemia: Secondary | ICD-10-CM

## 2021-06-28 DIAGNOSIS — I4891 Unspecified atrial fibrillation: Secondary | ICD-10-CM | POA: Diagnosis not present

## 2021-06-28 DIAGNOSIS — R059 Cough, unspecified: Secondary | ICD-10-CM | POA: Diagnosis not present

## 2021-06-28 DIAGNOSIS — G4730 Sleep apnea, unspecified: Secondary | ICD-10-CM | POA: Insufficient documentation

## 2021-06-28 DIAGNOSIS — G9389 Other specified disorders of brain: Secondary | ICD-10-CM | POA: Diagnosis not present

## 2021-06-28 DIAGNOSIS — I361 Nonrheumatic tricuspid (valve) insufficiency: Secondary | ICD-10-CM

## 2021-06-28 DIAGNOSIS — E039 Hypothyroidism, unspecified: Secondary | ICD-10-CM | POA: Diagnosis not present

## 2021-06-28 DIAGNOSIS — R42 Dizziness and giddiness: Secondary | ICD-10-CM | POA: Diagnosis not present

## 2021-06-28 DIAGNOSIS — Y92009 Unspecified place in unspecified non-institutional (private) residence as the place of occurrence of the external cause: Secondary | ICD-10-CM | POA: Diagnosis not present

## 2021-06-28 DIAGNOSIS — I480 Paroxysmal atrial fibrillation: Secondary | ICD-10-CM | POA: Diagnosis not present

## 2021-06-28 DIAGNOSIS — I517 Cardiomegaly: Secondary | ICD-10-CM | POA: Diagnosis not present

## 2021-06-28 DIAGNOSIS — W19XXXA Unspecified fall, initial encounter: Secondary | ICD-10-CM

## 2021-06-28 DIAGNOSIS — H3530 Unspecified macular degeneration: Secondary | ICD-10-CM | POA: Insufficient documentation

## 2021-06-28 LAB — CBC
HCT: 41.6 % (ref 39.0–52.0)
Hemoglobin: 14 g/dL (ref 13.0–17.0)
MCH: 28.4 pg (ref 26.0–34.0)
MCHC: 33.7 g/dL (ref 30.0–36.0)
MCV: 84.4 fL (ref 80.0–100.0)
Platelets: 146 10*3/uL — ABNORMAL LOW (ref 150–400)
RBC: 4.93 MIL/uL (ref 4.22–5.81)
RDW: 14.9 % (ref 11.5–15.5)
WBC: 5.7 10*3/uL (ref 4.0–10.5)
nRBC: 0 % (ref 0.0–0.2)

## 2021-06-28 LAB — COMPREHENSIVE METABOLIC PANEL
ALT: 13 U/L (ref 0–44)
AST: 17 U/L (ref 15–41)
Albumin: 3.5 g/dL (ref 3.5–5.0)
Alkaline Phosphatase: 33 U/L — ABNORMAL LOW (ref 38–126)
Anion gap: 8 (ref 5–15)
BUN: 19 mg/dL (ref 8–23)
CO2: 25 mmol/L (ref 22–32)
Calcium: 8.3 mg/dL — ABNORMAL LOW (ref 8.9–10.3)
Chloride: 106 mmol/L (ref 98–111)
Creatinine, Ser: 1.09 mg/dL (ref 0.61–1.24)
GFR, Estimated: >60 mL/min (ref >60)
Glucose, Bld: 90 mg/dL (ref 70–99)
Potassium: 3.6 mmol/L (ref 3.5–5.1)
Sodium: 139 mmol/L (ref 135–145)
Total Bilirubin: 0.8 mg/dL (ref 0.3–1.2)
Total Protein: 6.4 g/dL — ABNORMAL LOW (ref 6.5–8.1)

## 2021-06-28 LAB — LIPID PANEL
Cholesterol: 132 mg/dL (ref 0–200)
HDL: 57 mg/dL (ref >40)
LDL Cholesterol: 65 mg/dL (ref 0–99)
Total CHOL/HDL Ratio: 2.3 RATIO
Triglycerides: 48 mg/dL (ref <150)
VLDL: 10 mg/dL (ref 0–40)

## 2021-06-28 LAB — ECHOCARDIOGRAM COMPLETE BUBBLE STUDY
AR max vel: 1.64 cm2
AV Area VTI: 1.65 cm2
AV Area mean vel: 1.64 cm2
AV Mean grad: 3.3 mmHg
AV Peak grad: 6.2 mmHg
Ao pk vel: 1.25 m/s
Area-P 1/2: 4.26 cm2
S' Lateral: 3.1 cm

## 2021-06-28 LAB — HEMOGLOBIN A1C
Hgb A1c MFr Bld: 5.3 % (ref 4.8–5.6)
Mean Plasma Glucose: 105.41 mg/dL

## 2021-06-28 LAB — URINALYSIS, ROUTINE W REFLEX MICROSCOPIC
Bilirubin Urine: NEGATIVE
Glucose, UA: NEGATIVE mg/dL
Ketones, ur: NEGATIVE mg/dL
Leukocytes,Ua: NEGATIVE
Nitrite: NEGATIVE
Protein, ur: 30 mg/dL — AB
Specific Gravity, Urine: >=1.03 (ref 1.005–1.030)
pH: 5.5 (ref 5.0–8.0)

## 2021-06-28 LAB — RESP PANEL BY RT-PCR (FLU A&B, COVID) ARPGX2
Influenza A by PCR: NEGATIVE
Influenza B by PCR: NEGATIVE
SARS Coronavirus 2 by RT PCR: POSITIVE — AB

## 2021-06-28 MED ORDER — ONDANSETRON HCL 4 MG/2ML IJ SOLN: 4.0000 mg | Freq: Four times a day (QID) | INTRAMUSCULAR | Status: DC | PRN

## 2021-06-28 MED ORDER — ACETAMINOPHEN 160 MG/5ML PO SOLN: 650.0000 mg | ORAL | Status: DC | PRN

## 2021-06-28 MED ORDER — POLYETHYLENE GLYCOL 3350 17 G PO PACK: 17.0000 g | PACK | Freq: Every day | ORAL | Status: DC | PRN

## 2021-06-28 MED ORDER — LEVOTHYROXINE SODIUM 50 MCG PO TABS
50.0000 ug | ORAL_TABLET | Freq: Every day | ORAL | Status: DC
  Administered 2021-06-28 – 2021-06-29 (×2): 50 ug via ORAL
  Filled 2021-06-28 (×2): qty 1

## 2021-06-28 MED ORDER — STROKE: EARLY STAGES OF RECOVERY BOOK: Freq: Once | Status: AC

## 2021-06-28 MED ORDER — ACETAMINOPHEN 650 MG RE SUPP: 650.0000 mg | RECTAL | Status: DC | PRN

## 2021-06-28 MED ORDER — POTASSIUM CHLORIDE CRYS ER 20 MEQ PO TBCR
40.0000 meq | EXTENDED_RELEASE_TABLET | Freq: Once | ORAL | Status: AC
  Administered 2021-06-28: 40 meq via ORAL
  Filled 2021-06-28: qty 2

## 2021-06-28 MED ORDER — APIXABAN 5 MG PO TABS
5.0000 mg | ORAL_TABLET | Freq: Two times a day (BID) | ORAL | Status: DC
  Administered 2021-06-28 – 2021-06-29 (×3): 5 mg via ORAL
  Filled 2021-06-28 (×6): qty 1

## 2021-06-28 MED ORDER — SODIUM CHLORIDE 0.9% FLUSH
10.0000 mL | Freq: Once | INTRAVENOUS | Status: AC
  Administered 2021-06-28: 10 mL via INTRAVENOUS

## 2021-06-28 MED ORDER — LOSARTAN POTASSIUM 25 MG PO TABS: 25.0000 mg | ORAL_TABLET | Freq: Every morning | ORAL | Status: DC

## 2021-06-28 MED ORDER — ACETAMINOPHEN 325 MG PO TABS: 650.0000 mg | ORAL_TABLET | ORAL | Status: DC | PRN

## 2021-06-28 MED ORDER — ATORVASTATIN CALCIUM 10 MG PO TABS
10.0000 mg | ORAL_TABLET | Freq: Every day | ORAL | Status: DC
  Administered 2021-06-28: 10 mg via ORAL
  Filled 2021-06-28: qty 1

## 2021-06-28 MED ORDER — SODIUM CHLORIDE 0.9 % IV BOLUS
500.0000 mL | Freq: Once | INTRAVENOUS | Status: AC
Start: 2021-06-28 — End: 2021-06-28
  Administered 2021-06-28: 500 mL via INTRAVENOUS

## 2021-06-28 NOTE — Progress Notes (Signed)
  Echocardiogram 2D Echocardiogram with bubble study has been performed.  Darlina Sicilian M 06/28/2021, 8:24 AM

## 2021-06-28 NOTE — Consult Note (Signed)
[facility] TeleNeurology Consult Services  Stat Consult  Date of Service:   06/28/2021 01:30:55  Diagnosis:       R56.9 - Seizures       R42 - Dizziness/ Vertigo/ Giddiness  Impression Ismeal is an 87 year old male who is evaluated for his dizziness and confusion. His NIH is 0 and his head CT shows no acute findings. I reviewed the findings with the ER provider and recommended admission for a brain MRI, EEG and to restart his Eliquis.  CT HEAD: As Per Radiologist CT Head Showed No Acute Hemorrhage or Acute Core Infarct  Our recommendations are outlined below.  Diagnostic Studies: MRI brain without contrast Routine EEG  Disposition: Neurology will sign off. Reconsult if Needed.   Metrics: [facility] Notification Time: 06/28/2021 01:28:13 Stamp Time: 06/28/2021 01:30:55 Callback Response Time: 06/28/2021 01:34:41   ----------------------------------------------------------------------------------------------------  Chief Complaint: Dizziness  History of Present Illness: Patient is a 87 year old Male.  Mr. Poteat is an 87 year old male with HTN, HLD and a-fib who came to the ER for evaluation of dizziness, frequent falls and a bout of incontinence. He stopped his Eliquis 3 weeks prior as he was not able to afford this. His dizziness started around that same time. He has had falls with the dizziness. Tonight he fell in the shower and suffered a bout of incontinence. He does not believe that he passed out. He also had a period of confusion in which he was not tracking the conversation. Currently, he is oriented and has no focal weakness.   Past Medical History: Hypertension Hyperlipidemia Atrial Fibrillation  Anticoagulant use:  No  Antiplatelet use: No   Examination: BP(126/89), Pulse(73), Blood Glucose(97) 1A: Level of Consciousness - Alert; keenly responsive + 0 1B: Ask Month and Age - Both Questions Right + 0 1C: Blink Eyes & Squeeze  Hands - Performs Both Tasks + 0 2: Test Horizontal Extraocular Movements - Normal + 0 3: Test Visual Fields - No Visual Loss + 0 4: Test Facial Palsy (Use Grimace if Obtunded) - Normal symmetry + 0 5A: Test Left Arm Motor Drift - No Drift for 10 Seconds + 0 5B: Test Right Arm Motor Drift - No Drift for 10 Seconds + 0 6A: Test Left Leg Motor Drift - No Drift for 5 Seconds + 0 6B: Test Right Leg Motor Drift - No Drift for 5 Seconds + 0 7: Test Limb Ataxia (FNF/Heel-Shin) - No Ataxia + 0 8: Test Sensation - Normal; No sensory loss + 0 9: Test Language/Aphasia - Normal; No aphasia + 0 10: Test Dysarthria - Normal + 0 11: Test Extinction/Inattention - No abnormality + 0  NIHSS Score: 0     Patient / Family was informed the Neurology Consult would occur via TeleHealth consult by way of interactive audio and video telecommunications and consented to receiving care in this manner.  Patient is being evaluated for possible acute neurologic impairment and high probability of imminent or life - threatening deterioration.I spent total of 24 minutes providing care to this patient, including time for face to face visit via telemedicine, review of medical records, imaging studies and discussion of findings with providers, the patient and / or family.   Dr Birdena Jubilee   [facility] [phone]  Case [id number]

## 2021-06-28 NOTE — ED Notes (Signed)
Pt walked to restroom with cane and stand-by assist with this nurse. Pt gait unsteady, and he had to grab the wall multiple times

## 2021-06-28 NOTE — Plan of Care (Signed)
  Problem: Health Behavior/Discharge Planning: Goal: Ability to manage health-related needs will improve Outcome: Progressing   Problem: Clinical Measurements: Goal: Will remain free from infection Outcome: Progressing Goal: Diagnostic test results will improve Outcome: Progressing   Problem: Elimination: Goal: Will not experience complications related to bowel motility Outcome: Progressing   Problem: Safety: Goal: Ability to remain free from injury will improve Outcome: Progressing

## 2021-06-28 NOTE — ED Notes (Signed)
Pt speaking with Dr. Renard Hamper, neurologist, via teleneuro

## 2021-06-28 NOTE — ED Notes (Signed)
ED TO INPATIENT HANDOFF REPORT  Name/Age/Gender Glenn Ortega 87 y.o. male  Code Status Code Status History     Date Active Date Inactive Code Status Order ID Comments User Context   03/22/2021 1947 03/24/2021 1803 Full Code [id number]  Vianne Bulls, MD Inpatient   11/17/2015 1116 11/18/2015 1204 Full Code [id number]  Izora Gala, MD Inpatient   11/09/2013 1329 11/10/2013 1606 Full Code [id number]  Kritzer, Olga Coaster, MD Inpatient      Questions for Most Recent Historical Code Status (Order [id number])        Home/SNF/Other Home  Chief Complaint Left-sided weakness [R53.1]  Level of Care/Admitting Diagnosis ED Disposition     ED Disposition  Admit   Condition  --   Comment  Hospital Area: [facility] [id number]  Level of Care: Telemetry [5]  Admit to tele based on following criteria: Monitor for Ischemic changes  May place patient in observation at [facility] or [facility] if equivalent level of care is available:: No  Covid Evaluation: Asymptomatic Screening Protocol (No Symptoms)  Diagnosis: Left-sided weakness [id number]  Admitting Physician: Vernelle Emerald [id number]  Attending Physician: Vernelle Emerald [id number]          Medical History Past Medical History:  Diagnosis Date   Atrial fibrillation (HCC)    Back pain    GERD (gastroesophageal reflux disease)    Hyperlipidemia    Hypertension    EKG and chest x ray 8/12 [facility]/states had stress test 2 yrs ago- doesnt remember where- not in [facility]   Spinal stenosis    lumbar   Thyroid trouble     Allergies Allergies  Allergen Reactions   Penicillins Swelling    CHILDHOOD ALLERGY Has patient had a PCN reaction causing immediate rash, facial/tongue/throat swelling, SOB or lightheadedness with hypotension: Yes Has patient had a PCN reaction causing severe rash involving mucus membranes or skin necrosis: No Has patient had a PCN reaction that required hospitalization: No Has patient had a  PCN reaction occurring within the last 10 years: No If all of the above answers are "NO", then may proceed with Cephalosporin use.    Gabapentin Other (See Comments)    "MADE ME FEEL CRAZY"   Lisinopril Cough   Penicillin G Hives    IV Location/Drains/Wounds Patient Lines/Drains/Airways Status     Active Line/Drains/Airways     Name Placement date Placement time Site Days   Peripheral IV 06/27/21 20 G Anterior;Left Hand 06/27/21  2244  Hand  1   Incision 11/09/13 Back Bilateral 11/09/13  1039  -- 2788   Incision (Closed) 11/17/15 Face Left 11/17/15  0935  -- 2050            Labs/Imaging Results for orders placed or performed during the hospital encounter of 06/27/21 (from the past 48 hour(s))  CBC with Differential     Status: Abnormal   Collection Time: 06/27/21 10:53 PM  Result Value Ref Range   WBC 6.6 4.0 - 10.5 K/uL   RBC 5.26 4.22 - 5.81 MIL/uL   Hemoglobin 14.7 13.0 - 17.0 g/dL   HCT 45.1 39.0 - 52.0 %   MCV 85.7 80.0 - 100.0 fL   MCH 27.9 26.0 - 34.0 pg   MCHC 32.6 30.0 - 36.0 g/dL   RDW 15.1 11.5 - 15.5 %   Platelets 156 150 - 400 K/uL   nRBC 0.0 0.0 - 0.2 %   Neutrophils Relative % 66 %   Neutro Abs 4.3  1.7 - 7.7 K/uL   Lymphocytes Relative 16 %   Lymphs Abs 1.1 0.7 - 4.0 K/uL   Monocytes Relative 17 %   Monocytes Absolute 1.1 (H) 0.1 - 1.0 K/uL   Eosinophils Relative 0 %   Eosinophils Absolute 0.0 0.0 - 0.5 K/uL   Basophils Relative 0 %   Basophils Absolute 0.0 0.0 - 0.1 K/uL   Immature Granulocytes 1 %   Abs Immature Granulocytes 0.07 0.00 - 0.07 K/uL    Comment: Performed at [facility], [place] [address] [place], [place] [address]  Comprehensive metabolic panel     Status: Abnormal   Collection Time: 06/27/21 10:53 PM  Result Value Ref Range   Sodium 136 135 - 145 mmol/L   Potassium 3.4 (L) 3.5 - 5.1 mmol/L   Chloride 105 98 - 111 mmol/L   CO2 20 (L) 22 - 32 mmol/L   Glucose, Bld 97 70 - 99 mg/dL    Comment: Glucose  reference range applies only to samples taken after fasting for at least 8 hours.   BUN 18 8 - 23 mg/dL   Creatinine, Ser 1.02 0.61 - 1.24 mg/dL   Calcium 8.4 (L) 8.9 - 10.3 mg/dL   Total Protein 6.7 6.5 - 8.1 g/dL   Albumin 3.7 3.5 - 5.0 g/dL   AST 17 15 - 41 U/L   ALT 12 0 - 44 U/L   Alkaline Phosphatase 35 (L) 38 - 126 U/L   Total Bilirubin 1.0 0.3 - 1.2 mg/dL   GFR, Estimated >60 >60 mL/min    Comment: (NOTE) Calculated using the CKD-EPI Creatinine Equation (2021)    Anion gap 11 5 - 15    Comment: Performed at [facility], [place] [address], [place], [place] [address]  Ethanol     Status: None   Collection Time: 06/27/21 10:53 PM  Result Value Ref Range   Alcohol, Ethyl (B) <10 <10 mg/dL    Comment: (NOTE) Lowest detectable limit for serum alcohol is 10 mg/dL.  For medical purposes only. Performed at [facility], [place] [address] [place], [place] [address]   Urinalysis, Routine w reflex microscopic     Status: Abnormal   Collection Time: 06/28/21 12:30 AM  Result Value Ref Range   Color, Urine YELLOW (A) YELLOW   APPearance CLEAR (A) CLEAR   Specific Gravity, Urine >=1.030 1.005 - 1.030   pH 5.5 5.0 - 8.0   Glucose, UA NEGATIVE NEGATIVE mg/dL   Hgb urine dipstick SMALL (A) NEGATIVE   Bilirubin Urine NEGATIVE NEGATIVE   Ketones, ur NEGATIVE NEGATIVE mg/dL   Protein, ur 30 (A) NEGATIVE mg/dL   Nitrite NEGATIVE NEGATIVE   Leukocytes,Ua NEGATIVE NEGATIVE   RBC / HPF 0-5 0 - 5 RBC/hpf   WBC, UA 0-5 0 - 5 WBC/hpf   Bacteria, UA RARE (A) NONE SEEN   Mucus PRESENT     Comment: Performed at [facility], [place] [address], [place], [place] [address]   CT HEAD WO CONTRAST (5MM)  Result Date: 06/27/2021 CLINICAL DATA:  Slurred speech for several days, fell, incontinence EXAM: CT HEAD WITHOUT CONTRAST TECHNIQUE: Contiguous axial images were obtained from the base of the skull through the vertex without intravenous contrast.  COMPARISON:  06/14/2017 FINDINGS: Brain: No acute infarct or hemorrhage. Lateral ventricles and midline structures are unremarkable. No acute extra-axial fluid collections. No mass effect. Vascular: No hyperdense vessel or unexpected calcification. Skull: Normal. Negative for fracture or focal lesion.  Sinuses/Orbits: No acute finding. Other: None. IMPRESSION: 1. No acute intracranial process. Electronically Signed   By: Randa Ngo M.D.   On: 06/27/2021 23:41    Pending Labs Unresulted Labs (From admission, onward)    None       Vitals/Pain Today's Vitals   06/28/21 0041 06/28/21 0100 06/28/21 0153 06/28/21 0230  BP: 134/75 137/81 126/89 129/75  Pulse: 74 73 73 76  Resp: '16 16 11 '$ (!) 27  Temp:      TempSrc:      SpO2: 98% 98% 99% 92%  Weight:      Height:      PainSc:        Isolation Precautions No active isolations  Medications Medications  apixaban (ELIQUIS) tablet 5 mg (has no administration in time range)  sodium chloride 0.9 % bolus 500 mL (0 mLs Intravenous Stopped 06/28/21 0140)    Mobility walks with device

## 2021-06-28 NOTE — Evaluation (Signed)
Physical Therapy Evaluation Patient Details Name: Glenn Ortega MRN: [id number] DOB: 1934-01-09 Today's Date: 06/28/2021   History of Present Illness  87 y.o. male admitted on 06/27/21 for weakness, fall, transient LE giving way.  CT scan negative for acute findings MRI pending. Pt found to be COVID (+), but not needing supplemental O2, just a productive cough.  Pt with significant PMH of A-fib (on Elliquis), back pain, HTN, spinal stenosis (lumbar) s/p lumbarlaminectomy (2015).  Clinical Impression  Pt was able to ambulate for ~10 mins with cane with me around his room, mod I level of function.  Multiple productive coughs, VSS on RA throughout. He has an abnormal gait pattern, however, I believe this to be his baseline as he reports for [place] distances he carries a cane.  Pattern improved with increased distance.  He would benefit from acute therapy acutely, but I do not believe he will need any at d/c.   PT to follow acutely for deficits listed below.       Follow Up Recommendations No PT follow up    Equipment Recommendations  None recommended by PT    Recommendations for Other Services       Precautions / Restrictions Precautions Precautions: Other (comment) Precaution Comments: COVID (+)      Mobility  Bed Mobility Overal bed mobility: Modified Independent             General bed mobility comments: a little extra time, but nothing significant, used rails, but mostly because they were there, I don't think he reiled on them.    Transfers Overall transfer level: Modified independent Equipment used: Straight cane             General transfer comment: sit to stand mod I with cane, without cane reaching for support, but this is baseline.  Abnormal gait pattern, but not overly unsteady, I think the pattern his his normal as well, improved with increased distance (like he was working some stiffness out).  Pt report other than this unexplainable event he has not had  falls due to imbalance, he had one fall because he tripped last year, but it was mechanical, not a balance issue (he tripped over something he did not see).  Ambulation/Gait Ambulation/Gait assistance: Modified independent (Device/Increase time) Gait Distance (Feet): 75 Feet (at least, we went back and forth in his room for ~10 mins walking and talking using his cane) Assistive device: Straight cane Gait Pattern/deviations:  (lateral moving gait pattern, stiff legged improved with distance)        Stairs            Wheelchair Mobility    Modified Rankin (Stroke Patients Only)       Balance Overall balance assessment: Mild deficits observed, not formally tested                                           Pertinent Vitals/Pain Pain Assessment: No/denies pain    Home Living Family/patient expects to be discharged to:: Private residence Living Arrangements: Alone   Type of Home: House         Home Equipment: Kasandra Knudsen - single point (has in hospital room, but reports only uses for [place] distances)      Prior Function Level of Independence: Independent         Comments: Pt works full time as a Administrator,  was going to [place] with his son and grandson to go to a [employer] when this all happened.  Wife passed away a few years ago.  He works to have something to do and enjoys trucking.     Hand Dominance   Dominant Hand: Right    Extremity/Trunk Assessment   Upper Extremity Assessment Upper Extremity Assessment: RUE deficits/detail RUE Deficits / Details: Pt reports a pinched nerve in R shoulder, but functional.    Lower Extremity Assessment Lower Extremity Assessment: RLE deficits/detail;LLE deficits/detail RLE Deficits / Details: generalized weakness, looks to have some arthritis in his knees, has a bit of a lateral hobble to his gait, but this is reported as normal. LLE Deficits / Details: generalized weakness, looks  to have some arthritis in his knees, has a bit of a lateral hobble to his gait, but this is reported as normal.    Cervical / Trunk Assessment Cervical / Trunk Assessment: Other exceptions Cervical / Trunk Exceptions: has h/o lumbar stenosis, and laminectomy in 2015  Communication   Communication: No difficulties  Cognition Arousal/Alertness: Awake/alert Behavior During Therapy: WFL for tasks assessed/performed Overall Cognitive Status: Within Functional Limits for tasks assessed                                        General Comments General comments (skin integrity, edema, etc.): Other than a productive cough a few times during our session, pt looked good on RA with VSS throughout session.    Exercises General Exercises - Upper Extremity Shoulder Flexion: AROM;Both;10 reps Elbow Flexion: AROM;Both;10 reps General Exercises - Lower Extremity Long Arc Quad: AROM;Both;10 reps Hip Flexion/Marching: AROM;Both;10 reps Toe Raises: AROM;Both;10 reps Heel Raises: AROM;Both;10 reps   Assessment/Plan    PT Assessment Patient needs continued PT services  PT Problem List Decreased strength;Decreased activity tolerance;Decreased balance;Decreased mobility;Decreased knowledge of use of DME       PT Treatment Interventions DME instruction;Gait training;Functional mobility training;Therapeutic activities;Patient/family education;Therapeutic exercise;Balance training    PT Goals (Current goals can be found in the Care Plan section)  Acute Rehab PT Goals Patient Stated Goal: to go home and have some of his questions answered PT Goal Formulation: With patient Time For Goal Achievement: 07/12/21 Potential to Achieve Goals: Good    Frequency Min 3X/week   Barriers to discharge        Co-evaluation               AM-PAC PT "6 Clicks" Mobility  Outcome Measure Help needed turning from your back to your side while in a flat bed without using bedrails?: None Help  needed moving from lying on your back to sitting on the side of a flat bed without using bedrails?: None Help needed moving to and from a bed to a chair (including a wheelchair)?: None Help needed standing up from a chair using your arms (e.g., wheelchair or bedside chair)?: None Help needed to walk in hospital room?: None Help needed climbing 3-5 steps with a railing? : A Little 6 Click Score: 23    End of Session   Activity Tolerance: Patient tolerated treatment well Patient left: in chair;with call bell/phone within reach Nurse Communication: Mobility status;Other (comment) (to RN and Engineer, manufacturing, ok for pt to go to the bathroom on his own.) PT Visit Diagnosis: Muscle weakness (generalized) (M62.81);Difficulty in walking, not elsewhere classified (R26.2)    Time: [id number]  PT Time Calculation (min) (ACUTE ONLY): 28 min   Charges:   PT Evaluation $PT Eval Moderate Complexity: 1 Mod PT Treatments $Gait Training: 8-22 mins        Verdene Lennert, PT, DPT  Acute Rehabilitation Ortho Tech Supervisor [phone] pager [phone]) [phone] office

## 2021-06-28 NOTE — H&P (Signed)
History and Physical    Glenn Ortega [id number] DOB: 1934-04-19 DOA: 06/27/2021  PCP: [name], NP  Patient coming from: Home   Chief Complaint:  Chief Complaint  Patient presents with   Weakness     HPI:    87 year old male with past medical history of paroxysmal atrial fibrillation (on Eliquis), hypertension, hypothyroidism, hyperlipidemia who presents to [facility] after experiencing an episode of fall and transient leg weakness.  Patient explains that yesterday morning he and his son had traveled to [facility] to watch a [employer].  Shortly after arriving and after checking into the hotel patient suddenly experienced an episode of urinary incontinence.  Patient states that this is never happened before.  Patient decided to go back to his hotel room to clean himself up and while he was in the bathroom he states that "I suddenly fell backwards" onto the bathroom floor.  Patient does not recall any associated lightheadedness or loss of consciousness preceding this fall.  Patient denies any palpitations chest pain or diaphoresis.  Patient proceeded to get up and go into the bedroom to get dressed but noticed that he was having trouble controlling his left leg when trying to put on his underwear.  Patient also complains of associated feeling of generalized malaise throughout these events which also gradually resolved with resolution of all symptoms shortly thereafter.  Due to this constellation of symptoms patient felt he needed to be seen in the emergency department.  Rather than being seen in a local ER he and his son drove over 7 hours back to the [place] area and presented to the [facility] long hospital emergency department for evaluation.  Upon further questioning patient reports that he stopped taking his Eliquis 3 weeks ago when he ran out of medication.  Patient states that he could not afford a refill at that time.  Triage note states that patient had  experienced some slurred speech for the past several days however when I asked patient about this he denied it.  Upon evaluation in the emergency department CT imaging of the head without contrast was performed and was unremarkable.  Patient was evaluated by teleneurology with Dr. Renard Hamper who recommended reinitiation of patient's Eliquis and hospitalization for observation, MRI brain and EEG monitoring.  The hospitalist group was then called to assess the patient for admission to the hospital.  Review of Systems:   Review of Systems  Musculoskeletal:  Positive for falls.  Neurological:  Positive for focal weakness, seizures and weakness.  All other systems reviewed and are negative.  Past Medical History:  Diagnosis Date   Atrial fibrillation (HCC)    Back pain    GERD (gastroesophageal reflux disease)    Hyperlipidemia    Hypertension    EKG and chest x ray 8/12 [facility]/states had stress test 2 yrs ago- doesnt remember where- not in EPIC   Spinal stenosis    lumbar   Thyroid trouble     Past Surgical History:  Procedure Laterality Date   COLONOSCOPY     CYSTOSCOPY  12/30/2011   Procedure: CYSTOSCOPY;  Surgeon: Reece Packer, MD;  Location: [facility];  Service: Urology;  Laterality: N/A;   EYE SURGERY Bilateral 06/18/2017   cataract sx with lens implant   HERNIA REPAIR  2013   HERNIA REPAIR Right    age  27   LUMBAR LAMINECTOMY/DECOMPRESSION MICRODISCECTOMY N/A 11/09/2013   Procedure: Lumbar Laminectomy/Decompression, Microdiscectomy Lumbar Three-Four, Four-Five, with Coflex;  Surgeon: Faythe Ghee, MD;  Location: [facility];  Service: Neurosurgery;  Laterality: N/A;  Lumbar Laminectomy/Decompression, Microdiscectomy Lumbar Three-Four, Four-Five, with Coflex   PAROTIDECTOMY Left 11/17/2015   Procedure: LEFT PAROTIDECTOMY;  Surgeon: Izora Gala, MD;  Location: [facility];  Service: ENT;  Laterality: Left;   PROSTATE SURGERY  2013   SALIVARY GLAND SURGERY      TONSILLECTOMY     trigger thumb  1980   right     reports that he has never smoked. He has never used smokeless tobacco. He reports current alcohol use of about 2.0 standard drinks per week. He reports that he does not use drugs.  Allergies  Allergen Reactions   Penicillins Swelling    CHILDHOOD ALLERGY Has patient had a PCN reaction causing immediate rash, facial/tongue/throat swelling, SOB or lightheadedness with hypotension: Yes Has patient had a PCN reaction causing severe rash involving mucus membranes or skin necrosis: No Has patient had a PCN reaction that required hospitalization: No Has patient had a PCN reaction occurring within the last 10 years: No If all of the above answers are "NO", then may proceed with Cephalosporin use.    Gabapentin Other (See Comments)    "MADE ME FEEL CRAZY"   Lisinopril Cough   Penicillin G Hives    Family History  Problem Relation Age of Onset   Hypertension Other    Cancer Mother        bone?   Myasthenia gravis Father    Obesity Father    Heart disease Brother      Prior to Admission medications   Medication Sig Start Date End Date Taking? Authorizing Provider  apixaban (ELIQUIS) 5 MG TABS tablet Take 1 tablet (5 mg total) by mouth 2 (two) times daily. 06/15/21  Yes Jettie Booze, MD  atorvastatin (LIPITOR) 10 MG tablet TAKE ONE TABLET BY MOUTH DAILY Patient taking differently: Take 10 mg by mouth at bedtime. 11/25/20  Yes Nafziger, Tommi Rumps, NP  levothyroxine (SYNTHROID) 50 MCG tablet TAKE ONE TABLET BY MOUTH DAILY Patient taking differently: Take 50 mcg by mouth every morning. 03/02/21  Yes Nafziger, Tommi Rumps, NP  losartan (COZAAR) 25 MG tablet TAKE 1 TABLET BY MOUTH DAILY Patient taking differently: Take 25 mg by mouth every morning. 11/26/20  Yes Nafziger, Tommi Rumps, NP  Multiple Vitamins-Minerals (PRESERVISION AREDS 2 PO) Take 1 capsule by mouth 2 (two) times daily.   Yes [provider]    Physical Exam: Vitals:   06/28/21  0100 06/28/21 0153 06/28/21 0230 06/28/21 0434  BP: 137/81 126/89 129/75 (!) 106/58  Pulse: 73 73 76 74  Resp: 16 11 (!) 27 20  Temp:    98.1 F (36.7 C)  TempSrc:    Oral  SpO2: 98% 99% 92% 100%  Weight:      Height:        Constitutional: Awake alert and oriented x3, no associated distress.   Skin: no rashes, no lesions, good skin turgor noted. Eyes: Pupils are equally reactive to light.  No evidence of scleral icterus or conjunctival pallor.  ENMT: Moist mucous membranes noted.  Posterior pharynx clear of any exudate or lesions.   Neck: normal, supple, no masses, no thyromegaly.  No evidence of jugular venous distension.   Respiratory: clear to auscultation bilaterally, no wheezing, no crackles. Normal respiratory effort. No accessory muscle use.  Cardiovascular: Regular rate and rhythm, no murmurs / rubs / gallops. No extremity edema. 2+ pedal pulses. No carotid bruits.  Chest:   Nontender without crepitus or  deformity.   Back:   Nontender without crepitus or deformity. Abdomen: Abdomen is soft and nontender.  No evidence of intra-abdominal masses.  Positive bowel sounds noted in all quadrants.   Musculoskeletal: No joint deformity upper and lower extremities. Good ROM, no contractures. Normal muscle tone.  Neurologic: CN 2-12 grossly intact. Sensation intact.  Mild discernible weakness of the proximal distal muscle groups of the left lower extremity.  Patient is following all commands.  Patient is responsive to verbal stimuli.   Psychiatric: Patient exhibits normal mood with appropriate affect.  Patient seems to possess insight as to their current situation.     Labs on Admission: I have personally reviewed following labs and imaging studies -   CBC: Recent Labs  Lab 06/27/21 2253  WBC 6.6  NEUTROABS 4.3  HGB 14.7  HCT 45.1  MCV 85.7  PLT [id number]   Basic Metabolic Panel: Recent Labs  Lab 06/27/21 2253  NA 136  K 3.4*  CL 105  CO2 20*  GLUCOSE 97  BUN 18  CREATININE  1.02  CALCIUM 8.4*   GFR: Estimated Creatinine Clearance: 55.9 mL/min (by C-G formula based on SCr of 1.02 mg/dL). Liver Function Tests: Recent Labs  Lab 06/27/21 2253  AST 17  ALT 12  ALKPHOS 35*  BILITOT 1.0  PROT 6.7  ALBUMIN 3.7   No results for input(s): LIPASE, AMYLASE in the last 168 hours. No results for input(s): AMMONIA in the last 168 hours. Coagulation Profile: No results for input(s): INR, PROTIME in the last 168 hours. Cardiac Enzymes: No results for input(s): CKTOTAL, CKMB, CKMBINDEX, TROPONINI in the last 168 hours. BNP (last 3 results) No results for input(s): PROBNP in the last 8760 hours. HbA1C: No results for input(s): HGBA1C in the last 72 hours. CBG: No results for input(s): GLUCAP in the last 168 hours. Lipid Profile: No results for input(s): CHOL, HDL, LDLCALC, TRIG, CHOLHDL, LDLDIRECT in the last 72 hours. Thyroid Function Tests: No results for input(s): TSH, T4TOTAL, FREET4, T3FREE, THYROIDAB in the last 72 hours. Anemia Panel: No results for input(s): VITAMINB12, FOLATE, FERRITIN, TIBC, IRON, RETICCTPCT in the last 72 hours. Urine analysis:    Component Value Date/Time   COLORURINE YELLOW (A) 06/28/2021 0030   APPEARANCEUR CLEAR (A) 06/28/2021 0030   LABSPEC >=1.030 06/28/2021 0030   PHURINE 5.5 06/28/2021 0030   GLUCOSEU NEGATIVE 06/28/2021 0030   HGBUR SMALL (A) 06/28/2021 0030   BILIRUBINUR NEGATIVE 06/28/2021 0030   KETONESUR NEGATIVE 06/28/2021 0030   PROTEINUR 30 (A) 06/28/2021 0030   NITRITE NEGATIVE 06/28/2021 0030   LEUKOCYTESUR NEGATIVE 06/28/2021 0030    Radiological Exams on Admission - Personally Reviewed: CT HEAD WO CONTRAST (5MM)  Result Date: 06/27/2021 CLINICAL DATA:  Slurred speech for several days, fell, incontinence EXAM: CT HEAD WITHOUT CONTRAST TECHNIQUE: Contiguous axial images were obtained from the base of the skull through the vertex without intravenous contrast. COMPARISON:  06/14/2017 FINDINGS: Brain: No acute  infarct or hemorrhage. Lateral ventricles and midline structures are unremarkable. No acute extra-axial fluid collections. No mass effect. Vascular: No hyperdense vessel or unexpected calcification. Skull: Normal. Negative for fracture or focal lesion. Sinuses/Orbits: No acute finding. Other: None. IMPRESSION: 1. No acute intracranial process. Electronically Signed   By: Randa Ngo M.D.   On: 06/27/2021 23:41    EKG: Personally reviewed.  Rhythm is atrial fibrillation with heart rate of 71 bpm.  No dynamic ST segment changes appreciated.  Assessment/Plan Principal Problem:   Fall, initial encounter  Patient presenting  with a series of vague neurologic complaints including sudden urinary incontinence, a sudden fall and transient left lower extremity weakness Patient is neurologically at baseline upon evaluation here although I do feel the patient may have some mild weakness of the left lower extremity on exam. It is possible that patient experienced a small stroke during his 3-week period of Eliquis noncompliance. Per Dr. Renard Hamper assessment, patient has been placed in observation with serial neurologic checks Obtaining noncontrast MRI brain to evaluate for possibility of acute stroke Obtaining EEG Obtaining echocardiogram Monitoring patient on telemetry Temporarily holding home regimen of losartan until MRI brain is completed. Obtaining hemoglobin A1c, lipid panel PT evaluation ordered If MRI does reveal a stroke we will discuss potential need for transfer to [facility] with neurology.  Active Problems:   Mixed hyperlipidemia  Continue home regimen of Lipitor Obtaining lipid panel to ensure LDL less than 70    AF (paroxysmal atrial fibrillation) (HCC) Resuming Eliquis therapy per recommendation of neurology Monitoring patient on telemetry Patient is currently rate controlled    Hypothyroidism  Continue home regimen of Synthroid    Cough  Patient found to exhibit frequent cough  throughout the interview Lungs are clear Oxygen saturation is normal Obtaining COVID-19 PCR testing and chest x-ray    Hypokalemia  Replacing with oral potassium chloride   Code Status:  Full code  code status decision has been confirmed with: patient Family Communication: deferred   Status is: Observation  The patient remains OBS appropriate and will d/c before 2 midnights.  Dispo: The patient is from: Home              Anticipated d/c is to: Home              Patient currently is not medically stable to d/c.   Difficult to place patient No        Vernelle Emerald MD [facility] Pager [phone]  If 7PM-7AM, please contact night-coverage [email or web address] Use universal [place] password for that web site. If you do not have the password, please call the hospital operator.  06/28/2021, 5:32 AM

## 2021-06-28 NOTE — Progress Notes (Signed)
This patient was admitted this morning by my partner.  After being admitted it was found he was incidentally COVID-positive with symptoms of cough no shortness of breath or fever.  He drove to [facility] to see [facility] game yesterday he thinks he probably got it from the arena.  He was admitted with multiple symptoms including urinary incontinence left lower extremity weakness with history of A. fib not taking his Eliquis.  He was seen by telemetry neurologist recommended observation admission MRI EEG and echo.  If they are negative he will be discharged home.  This was communicated with the patient.

## 2021-06-29 ENCOUNTER — Telehealth: Payer: Self-pay

## 2021-06-29 ENCOUNTER — Observation Stay (HOSPITAL_COMMUNITY)
Admit: 2021-06-29 | Discharge: 2021-06-29 | Disposition: A | Discharge status: Home / Self Care | Payer: PPO | Attending: Internal Medicine | Admitting: Internal Medicine

## 2021-06-29 DIAGNOSIS — R404 Transient alteration of awareness: Secondary | ICD-10-CM | POA: Diagnosis not present

## 2021-06-29 DIAGNOSIS — Y92009 Unspecified place in unspecified non-institutional (private) residence as the place of occurrence of the external cause: Secondary | ICD-10-CM | POA: Diagnosis not present

## 2021-06-29 DIAGNOSIS — W19XXXA Unspecified fall, initial encounter: Secondary | ICD-10-CM | POA: Diagnosis not present

## 2021-06-29 DIAGNOSIS — I480 Paroxysmal atrial fibrillation: Secondary | ICD-10-CM | POA: Diagnosis not present

## 2021-06-29 MED ORDER — LOPERAMIDE HCL 2 MG PO CAPS
2.0000 mg | ORAL_CAPSULE | Freq: Four times a day (QID) | ORAL | Status: DC | PRN
  Administered 2021-06-29: 2 mg via ORAL
  Filled 2021-06-29: qty 1

## 2021-06-29 MED ORDER — NIRMATRELVIR/RITONAVIR (PAXLOVID)TABLET: 3.0000 | ORAL_TABLET | Freq: Two times a day (BID) | ORAL | 0 refills | Status: AC

## 2021-06-29 NOTE — Procedures (Signed)
Patient Name: Glenn Ortega  MRN: [id number]  Epilepsy Attending: Lora Havens  Referring Physician/Provider: Dr Inda Merlin Date: 06/29/2021 Duration: 21.32 mins  Patient history: 87 year old male with an episode of confusion.  EEG to evaluate for seizures.  Level of alertness: Awake, asleep  AEDs during EEG study: None  Technical aspects: This EEG study was done with scalp electrodes positioned according to the 10-20 International system of electrode placement. Electrical activity was acquired at a sampling rate of '500Hz'$  and reviewed with a high frequency filter of '70Hz'$  and a low frequency filter of '1Hz'$ . EEG data were recorded continuously and digitally stored.   Description: The posterior dominant rhythm consists of 9 Hz activity of moderate voltage (25-35 uV) seen predominantly in posterior head regions, symmetric and reactive to eye opening and eye closing. Sleep was characterized by vertex waves, sleep spindles (12 to 14 Hz), maximal frontocentral region. Sharp transient was noted in left temporal region. Physiologic photic driving was seen during photic stimulation. Hyperventilation was not performed.     IMPRESSION: This study is within normal limits. No seizures or definite epileptiform discharges were seen throughout the recording.  Waldo Damian Barbra Sarks

## 2021-06-29 NOTE — Telephone Encounter (Signed)
Patient called stating that he is in the hospital and was Dx with Covid and would like a Rx sent to the pharmacy for Paxlovid pt was told to call his PCP for prescription

## 2021-06-29 NOTE — Discharge Summary (Signed)
Physician Discharge Summary  JAAN FILAR [id number] DOB: [birth year] DOA: 06/27/2021  PCP: [name], NP  Admit date: 06/27/2021 Discharge date: 06/29/2021  Admitted From: Home Disposition: Home   Recommendations for Outpatient Follow-up:  Follow up with PCP in 1-2 weeks Please obtain BMP/CBC in one week Please follow up with cardiology and neurology Continue taking Eliquis Home Health: None Equipment/Devices: None   Discharge Condition: Stable CODE STATUS: Full code Diet recommendation: Cardiac Brief/Interim Summary:87 year old male with past medical history of paroxysmal atrial fibrillation (on Eliquis), hypertension, hypothyroidism, hyperlipidemia who presents to [facility] after experiencing an episode of fall and transient leg weakness.  Patient explains that yesterday morning he and his son had traveled to [facility] to watch a [employer].  Shortly after arriving and after checking into the hotel patient suddenly experienced an episode of urinary incontinence.  Patient states that this is never happened before.   Patient decided to go back to his hotel room to clean himself up and while he was in the bathroom he states that "I suddenly fell backwards" onto the bathroom floor.  Patient does not recall any associated lightheadedness or loss of consciousness preceding this fall.  Patient denies any palpitations chest pain or diaphoresis.  Patient proceeded to get up and go into the bedroom to get dressed but noticed that he was having trouble controlling his left leg when trying to put on his underwear.  Patient also complains of associated feeling of generalized malaise throughout these events which also gradually resolved with resolution of all symptoms shortly thereafter.   Due to this constellation of symptoms patient felt he needed to be seen in the emergency department.  Rather than being seen in a local ER he and his son drove over 7 hours back to the  [place] area and presented to the [facility] long hospital emergency department for evaluation.   Upon further questioning patient reports that he stopped taking his Eliquis 3 weeks ago when he ran out of medication.  Patient states that he could not afford a refill at that time.   Triage note states that patient had experienced some slurred speech for the past several days however when I asked patient about this he denied it.   Upon evaluation in the emergency department CT imaging of the head without contrast was performed and was unremarkable.  Patient was evaluated by teleneurology with Dr. Renard Hamper who recommended reinitiation of patient's Eliquis and hospitalization for observation, MRI brain and EEG monitoring.  The hospitalist group was then called to assess the patient for admission to the hospital.     Discharge Diagnoses:  Principal Problem:   Fall at home, initial encounter Active Problems:   Mixed hyperlipidemia   AF (paroxysmal atrial fibrillation) (HCC)   Hypothyroidism   Cough   Hypokalemia   Pressure Injury 06/28/21 Buttocks Stage 2 -  Partial thickness loss of dermis presenting as a shallow open injury with a red, pink wound bed without slough. Pink with partial skin shearing noted to wound bed (Active)  06/28/21 0455  Location: Buttocks  Location Orientation:   Staging: Stage 2 -  Partial thickness loss of dermis presenting as a shallow open injury with a red, pink wound bed without slough.  Wound Description (Comments): Pink with partial skin shearing noted to wound bed  Present on Admission: Yes    #1 status post fall patient admitted with multiple vague neurologic complaints including sudden urinary incontinence and a fall and transient left lower extremity weakness.  Since being in the hospital he is not incontinent anymore he has not had any falls and his left lower extremity is back to his baseline.  Patient reports he was supposed to be taking Eliquis but he  stopped taking Eliquis due to financial struggle.  Patient was seen by telemetry neurologist.  Patient was admitted to monitor overnight with MRI and EEG and echocardiogram. MRI brain no acute intracranial abnormality mild chronic microvascular ischemic disease for age.  Sequelae of prior left parotidectomy. EEG pending Echo shows ejection fraction 55 to 60%.  Normal left ventricular function no regional wall motion abnormalities.  Tricuspid aortic valve. LDL 65, A1c is 5.3 Continue apixaban and statin on discharge.  #2 hyperlipidemia continue statin.  #3 paroxysmal atrial fibrillation continue Eliquis  #4 hypothyroidism continue Synthroid  #5 incidental COVID patient's complaint is mainly diarrhea he has no cough or shortness of breath or fever.  Symptomatic treatment.    Estimated body mass index is 32.28 kg/m as calculated from the following:   Height as of this encounter: '5\' 6"'$  (1.676 m).   Weight as of this encounter: 90.7 kg.  Discharge Instructions  Discharge Instructions     Diet - low sodium heart healthy   Complete by: As directed    Discharge wound care:   Complete by: As directed    See orders   Increase activity slowly   Complete by: As directed       Allergies as of 06/29/2021       Reactions   Penicillins Swelling   CHILDHOOD ALLERGY Has patient had a PCN reaction causing immediate rash, facial/tongue/throat swelling, SOB or lightheadedness with hypotension: Yes Has patient had a PCN reaction causing severe rash involving mucus membranes or skin necrosis: No Has patient had a PCN reaction that required hospitalization: No Has patient had a PCN reaction occurring within the last 10 years: No If all of the above answers are "NO", then may proceed with Cephalosporin use.   Gabapentin Other (See Comments)   "MADE ME FEEL CRAZY"   Lisinopril Cough   Penicillin G Hives        Medication List     TAKE these medications    apixaban 5 MG Tabs  tablet Commonly known as: Eliquis Take 1 tablet (5 mg total) by mouth 2 (two) times daily.   atorvastatin 10 MG tablet Commonly known as: LIPITOR TAKE ONE TABLET BY MOUTH DAILY What changed: when to take this   levothyroxine 50 MCG tablet Commonly known as: SYNTHROID TAKE ONE TABLET BY MOUTH DAILY What changed: when to take this   losartan 25 MG tablet Commonly known as: COZAAR TAKE 1 TABLET BY MOUTH DAILY What changed:  how much to take how to take this when to take this additional instructions   PRESERVISION AREDS 2 PO Take 1 capsule by mouth 2 (two) times daily.               Discharge Care Instructions  (From admission, onward)           Start     Ordered   06/29/21 0000  Discharge wound care:       Comments: See orders   06/29/21 1012            Follow-up Information     Nafziger, Tommi Rumps, NP Follow up.   Specialty: Family Medicine Contact information: [place] [address] [phone]         Jettie Booze, MD Follow  up.   Specialties: Cardiology, Radiology, Interventional Cardiology Contact information: [id number] [place] [address] [phone]                Allergies  Allergen Reactions   Penicillins Swelling    CHILDHOOD ALLERGY Has patient had a PCN reaction causing immediate rash, facial/tongue/throat swelling, SOB or lightheadedness with hypotension: Yes Has patient had a PCN reaction causing severe rash involving mucus membranes or skin necrosis: No Has patient had a PCN reaction that required hospitalization: No Has patient had a PCN reaction occurring within the last 10 years: No If all of the above answers are "NO", then may proceed with Cephalosporin use.    Gabapentin Other (See Comments)    "MADE ME FEEL CRAZY"   Lisinopril Cough   Penicillin G Hives    Consultations: Telemetry neurologist   Procedures/Studies: DG Chest 1 View  Result Date:  06/28/2021 CLINICAL DATA:  87 year old male with dizziness. Atrial fibrillation. EXAM: CHEST  1 VIEW COMPARISON:  Chest radiographs 06/18/2020 and earlier. FINDINGS: Portable AP semi upright view at 0550 hours. Mild cardiomegaly and tortuosity of the thoracic aorta appears stable. Lower lung volumes. Allowing for portable technique the lungs are clear. No pneumothorax or pleural effusion. Tracheal air column within normal limits. Osteopenia. No acute osseous abnormality identified. Paucity of bowel gas in the upper abdomen. IMPRESSION: Mild cardiomegaly.  No acute cardiopulmonary abnormality. Electronically Signed   By: Genevie Ann M.D.   On: 06/28/2021 06:30   CT HEAD WO CONTRAST (5MM)  Result Date: 06/27/2021 CLINICAL DATA:  Slurred speech for several days, fell, incontinence EXAM: CT HEAD WITHOUT CONTRAST TECHNIQUE: Contiguous axial images were obtained from the base of the skull through the vertex without intravenous contrast. COMPARISON:  06/14/2017 FINDINGS: Brain: No acute infarct or hemorrhage. Lateral ventricles and midline structures are unremarkable. No acute extra-axial fluid collections. No mass effect. Vascular: No hyperdense vessel or unexpected calcification. Skull: Normal. Negative for fracture or focal lesion. Sinuses/Orbits: No acute finding. Other: None. IMPRESSION: 1. No acute intracranial process. Electronically Signed   By: Randa Ngo M.D.   On: 06/27/2021 23:41   MR BRAIN WO CONTRAST  Result Date: 06/28/2021 CLINICAL DATA:  Initial evaluation for neuro deficit, stroke suspected. EXAM: MRI HEAD WITHOUT CONTRAST TECHNIQUE: Multiplanar, multiecho pulse sequences of the brain and surrounding structures were obtained without intravenous contrast. COMPARISON:  CT from 06/27/2021. FINDINGS: Brain: Susceptibility artifact emanates from the right suboccipital region, somewhat limiting the exam. Cerebral volume within normal limits for age. Patchy T2/FLAIR hyperintensity within the  periventricular and deep white matter both cerebral hemispheres most consistent with chronic small vessel ischemic disease, mild in nature. No abnormal foci of restricted diffusion to suggest acute or subacute ischemia. Gray-white matter differentiation maintained. No areas of encephalomalacia to suggest chronic cortical infarction. No visible evidence for acute or chronic intracranial hemorrhage. No mass lesion, midline shift or mass effect. No hydrocephalus or extra-axial fluid collection. Pituitary gland suprasellar region within normal limits. Midline structures intact. Vascular: Major intracranial vascular flow voids are maintained. Skull and upper cervical spine: Mild degenerative thickening noted at the tectorial membrane without significant stenosis. Craniocervical junction otherwise unremarkable. Bone marrow signal intensity within normal limits. No scalp soft tissue abnormality. Sinuses/Orbits: Fusion status post bilateral ocular lens replacement. Globes and orbital soft tissues demonstrate no acute finding. Scattered mucosal thickening noted throughout the paranasal sinuses. No air-fluid levels to suggest acute sinusitis. Small left mastoid effusion noted, of doubtful significance. Visualized nasopharynx unremarkable.  Other: Sequelae of prior left parietal ectomy partially visualized. 11 mm T2 hyperintense cystic lesion noted near the parotidectomy site (series 16, image 15), indeterminate, but possibly reflecting a small cystic lymph node. IMPRESSION: 1. No acute intracranial abnormality. 2. Mild chronic microvascular ischemic disease for age. 3. Sequelae of prior left parotidectomy. 11 mm cystic lesion positioned bed, indeterminate, but could reflect a small cystic lymph node. Correlation with physical exam recommended. Additionally, correlation with dedicated CT of the neck with contrast could be performed for further evaluation as warranted. Electronically Signed   By: Jeannine Boga M.D.   On:  06/28/2021 19:03   ECHOCARDIOGRAM COMPLETE BUBBLE STUDY  Result Date: 06/28/2021    ECHOCARDIOGRAM REPORT   Patient Name:   DEMAR FRITCHIE Date of Exam: 06/28/2021 Medical Rec #:  [id number]      Height:       66.0 in Accession #:    [id number]     Weight:       200.0 lb Date of Birth:  1934/02/28       BSA:          2.000 m Patient Age:    37 years       BP:           106/58 mmHg Patient Gender: M              HR:           79 bpm. Exam Location:  Inpatient Procedure: 2D Echo, Intracardiac Opacification Agent, Color Doppler and Cardiac            Doppler Indications:    Stroke 434.91 / I63.9  History:        Patient has prior history of Echocardiogram examinations, most                 recent 07/16/2020. Arrythmias:Atrial Fibrillation; Risk                 Factors:Hypertension and Dyslipidemia. GERD. COVID.  Sonographer:    Darlina Sicilian RDCS Referring Phys: [id number] [facility]  Sonographer Comments: Bubble study attempted multiple times, IV is located in the left radial vein. IMPRESSIONS  1. Saline microcavitation study with no clear shunt but suboptimal and PFO cannot be excluded.  2. Left ventricular ejection fraction, by estimation, is 55 to 60%. The left ventricle has normal function. The left ventricle has no regional wall motion abnormalities. Left ventricular diastolic function could not be evaluated.  3. Right ventricular systolic function is mildly reduced. The right ventricular size is mildly enlarged. There is normal pulmonary artery systolic pressure.  4. Left atrial size was mildly dilated.  5. Right atrial size was moderately dilated.  6. The mitral valve is normal in structure. Trivial mitral valve regurgitation. No evidence of mitral stenosis.  7. Tricuspid valve regurgitation is moderate.  8. The aortic valve is tricuspid. Aortic valve regurgitation is not visualized. Mild aortic valve sclerosis is present, with no evidence of aortic valve stenosis.  9. The inferior vena cava is normal in  size with greater than 50% respiratory variability, suggesting right atrial pressure of 3 mmHg. FINDINGS  Left Ventricle: Left ventricular ejection fraction, by estimation, is 55 to 60%. The left ventricle has normal function. The left ventricle has no regional wall motion abnormalities. The left ventricular internal cavity size was normal in size. There is  no left ventricular hypertrophy. Left ventricular diastolic function could not be evaluated due to atrial fibrillation. Left ventricular diastolic  function could not be evaluated. Right Ventricle: The right ventricular size is mildly enlarged. Right ventricular systolic function is mildly reduced. There is normal pulmonary artery systolic pressure. The tricuspid regurgitant velocity is 2.73 m/s, and with an assumed right atrial pressure of 3 mmHg, the estimated right ventricular systolic pressure is 0000000 mmHg. Left Atrium: Left atrial size was mildly dilated. Right Atrium: Right atrial size was moderately dilated. Pericardium: There is no evidence of pericardial effusion. Mitral Valve: The mitral valve is normal in structure. Trivial mitral valve regurgitation. No evidence of mitral valve stenosis. Tricuspid Valve: The tricuspid valve is normal in structure. Tricuspid valve regurgitation is moderate . No evidence of tricuspid stenosis. Aortic Valve: The aortic valve is tricuspid. Aortic valve regurgitation is not visualized. Mild aortic valve sclerosis is present, with no evidence of aortic valve stenosis. Aortic valve mean gradient measures 3.3 mmHg. Aortic valve peak gradient measures 6.2 mmHg. Aortic valve area, by VTI measures 1.65 cm. Pulmonic Valve: The pulmonic valve was not well visualized. Pulmonic valve regurgitation is not visualized. No evidence of pulmonic stenosis. Aorta: The aortic root is normal in size and structure. Venous: The inferior vena cava is normal in size with greater than 50% respiratory variability, suggesting right atrial pressure  of 3 mmHg. IAS/Shunts: No atrial level shunt detected by color flow Doppler. Additional Comments: Saline microcavitation study with no clear shunt but suboptimal and PFO cannot be excluded.  LEFT VENTRICLE PLAX 2D LVIDd:         4.00 cm LVIDs:         3.10 cm LV PW:         1.00 cm LV IVS:        1.20 cm LVOT diam:     1.80 cm  3D Volume EF: LV SV:         35       3D EF:        45 % LV SV Index:   18       LV EDV:       97 ml LVOT Area:     2.54 cm LV ESV:       53 ml                         LV SV:        44 ml RIGHT VENTRICLE RV S prime:     11.40 cm/s TAPSE (M-mode): 1.5 cm LEFT ATRIUM             Index       RIGHT ATRIUM           Index LA diam:        4.20 cm 2.10 cm/m  RA Area:     23.40 cm LA Vol (A2C):   61.9 ml 30.95 ml/m RA Volume:   70.50 ml  35.25 ml/m LA Vol (A4C):   61.4 ml 30.70 ml/m LA Biplane Vol: 63.6 ml 31.80 ml/m  AORTIC VALVE AV Area (Vmax):    1.64 cm AV Area (Vmean):   1.64 cm AV Area (VTI):     1.65 cm AV Vmax:           124.67 cm/s AV Vmean:          89.200 cm/s AV VTI:            0.214 m AV Peak Grad:      6.2 mmHg AV Mean Grad:  3.3 mmHg LVOT Vmax:         80.40 cm/s LVOT Vmean:        57.600 cm/s LVOT VTI:          0.138 m LVOT/AV VTI ratio: 0.65  AORTA Ao Root diam: 3.30 cm Ao Asc diam:  3.20 cm MITRAL VALVE                TRICUSPID VALVE MV Area (PHT): 4.26 cm     TR Peak grad:   29.8 mmHg MV Decel Time: 178 msec     TR Vmax:        273.00 cm/s MV E velocity: 106.00 cm/s                             SHUNTS                             Systemic VTI:  0.14 m                             Systemic Diam: 1.80 cm Kirk Ruths MD Electronically signed by Kirk Ruths MD Signature Date/Time: 06/28/2021/12:36:45 PM    Final    (Echo, Carotid, EGD, Colonoscopy, ERCP)    Subjective:  Patient is resting in bed anxious to go home he is awake and alert he is back to his baseline speech is clear he is eating food without any struggle he is walking in the room he has diarrhea which  is likely due to [place] Discharge Exam: Vitals:   06/28/21 2013 06/29/21 0517  BP: 104/64 124/66  Pulse: 85 75  Resp: 20 16  Temp: 99.5 F (37.5 C) 98.1 F (36.7 C)  SpO2: 95% 97%   Vitals:   06/28/21 0230 06/28/21 0434 06/28/21 2013 06/29/21 0517  BP: 129/75 (!) 106/58 104/64 124/66  Pulse: 76 74 85 75  Resp: (!) '27 20 20 16  '$ Temp:  98.1 F (36.7 C) 99.5 F (37.5 C) 98.1 F (36.7 C)  TempSrc:  Oral Oral Oral  SpO2: 92% 100% 95% 97%  Weight:      Height:        General: Pt is alert, awake, not in acute distress Cardiovascular: RRR, S1/S2 +, no rubs, no gallops Respiratory: CTA bilaterally, no wheezing, no rhonchi Abdominal: Soft, NT, ND, bowel sounds + Extremities: no edema, no cyanosis    The results of significant diagnostics from this hospitalization (including imaging, microbiology, ancillary and laboratory) are listed below for reference.     Microbiology: Recent Results (from the past 240 hour(s))  Resp Panel by RT-PCR (Flu A&B, Covid) Nasopharyngeal Swab     Status: Abnormal   Collection Time: 06/28/21  6:20 AM   Specimen: Nasopharyngeal Swab; Nasopharyngeal(NP) swabs in vial transport medium  Result Value Ref Range Status   SARS Coronavirus 2 by RT PCR POSITIVE (A) NEGATIVE Final    Comment: RESULT CALLED TO, READ BACK BY AND VERIFIED WITH: Katha Hamming, RN [phone] 06/28/21 KDS (NOTE) SARS-CoV-2 target nucleic acids are DETECTED.  The SARS-CoV-2 RNA is generally detectable in upper respiratory specimens during the acute phase of infection. Positive results are indicative of the presence of the identified virus, but do not rule out bacterial infection or co-infection with other pathogens not detected by the test. Clinical correlation with patient history and other diagnostic information is necessary  to determine patient infection status. The expected result is Negative.  Fact Sheet for Patients: EntrepreneurPulse.com.au  Fact Sheet for  Healthcare Providers: IncredibleEmployment.be  This test is not yet approved or cleared by the [place] FDA and  has been authorized for detection and/or diagnosis of SARS-CoV-2 by FDA under an Emergency Use Authorization (EUA).  This EUA will remain in effect (meaning this test can  be used) for the duration of  the COVID-19 declaration under Section 564(b)(1) of the Act, 21 U.S.C. section 360bbb-3(b)(1), unless the authorization is terminated or revoked sooner.     Influenza A by PCR NEGATIVE NEGATIVE Final   Influenza B by PCR NEGATIVE NEGATIVE Final    Comment: (NOTE) The Xpert Xpress SARS-CoV-2/FLU/RSV plus assay is intended as an aid in the diagnosis of influenza from Nasopharyngeal swab specimens and should not be used as a sole basis for treatment. Nasal washings and aspirates are unacceptable for Xpert Xpress SARS-CoV-2/FLU/RSV testing.  Fact Sheet for Patients: EntrepreneurPulse.com.au  Fact Sheet for Healthcare Providers: IncredibleEmployment.be  This test is not yet approved or cleared by the [place] FDA and has been authorized for detection and/or diagnosis of SARS-CoV-2 by FDA under an Emergency Use Authorization (EUA). This EUA will remain in effect (meaning this test can be used) for the duration of the COVID-19 declaration under Section 564(b)(1) of the Act, 21 U.S.C. section 360bbb-3(b)(1), unless the authorization is terminated or revoked.  Performed at [facility], [place] [address] [place], [place] [address]      Labs: BNP (last 3 results) No results for input(s): BNP in the last 8760 hours. Basic Metabolic Panel: Recent Labs  Lab 06/27/21 2253 06/28/21 0622  NA 136 139  K 3.4* 3.6  CL 105 106  CO2 20* 25  GLUCOSE 97 90  BUN 18 19  CREATININE 1.02 1.09  CALCIUM 8.4* 8.3*   Liver Function Tests: Recent Labs  Lab 06/27/21 2253 06/28/21 0622  AST 17 17   ALT 12 13  ALKPHOS 35* 33*  BILITOT 1.0 0.8  PROT 6.7 6.4*  ALBUMIN 3.7 3.5   No results for input(s): LIPASE, AMYLASE in the last 168 hours. No results for input(s): AMMONIA in the last 168 hours. CBC: Recent Labs  Lab 06/27/21 2253 06/28/21 0622  WBC 6.6 5.7  NEUTROABS 4.3  --   HGB 14.7 14.0  HCT 45.1 41.6  MCV 85.7 84.4  PLT 156 146*   Cardiac Enzymes: No results for input(s): CKTOTAL, CKMB, CKMBINDEX, TROPONINI in the last 168 hours. BNP: Invalid input(s): POCBNP CBG: No results for input(s): GLUCAP in the last 168 hours. D-Dimer No results for input(s): DDIMER in the last 72 hours. Hgb A1c Recent Labs    06/28/21 0622  HGBA1C 5.3   Lipid Profile Recent Labs    06/28/21 0622  CHOL 132  HDL 57  LDLCALC 65  TRIG 48  CHOLHDL 2.3   Thyroid function studies No results for input(s): TSH, T4TOTAL, T3FREE, THYROIDAB in the last 72 hours.  Invalid input(s): FREET3 Anemia work up No results for input(s): VITAMINB12, FOLATE, FERRITIN, TIBC, IRON, RETICCTPCT in the last 72 hours. Urinalysis    Component Value Date/Time   COLORURINE YELLOW (A) 06/28/2021 0030   APPEARANCEUR CLEAR (A) 06/28/2021 0030   LABSPEC >=1.030 06/28/2021 0030   PHURINE 5.5 06/28/2021 0030   GLUCOSEU NEGATIVE 06/28/2021 0030   HGBUR SMALL (A) 06/28/2021 0030   BILIRUBINUR NEGATIVE 06/28/2021 0030   KETONESUR NEGATIVE 06/28/2021 0030   PROTEINUR  30 (A) 06/28/2021 0030   NITRITE NEGATIVE 06/28/2021 0030   LEUKOCYTESUR NEGATIVE 06/28/2021 0030   Sepsis Labs Invalid input(s): PROCALCITONIN,  WBC,  LACTICIDVEN Microbiology Recent Results (from the past 240 hour(s))  Resp Panel by RT-PCR (Flu A&B, Covid) Nasopharyngeal Swab     Status: Abnormal   Collection Time: 06/28/21  6:20 AM   Specimen: Nasopharyngeal Swab; Nasopharyngeal(NP) swabs in vial transport medium  Result Value Ref Range Status   SARS Coronavirus 2 by RT PCR POSITIVE (A) NEGATIVE Final    Comment: RESULT CALLED TO,  READ BACK BY AND VERIFIED WITH: Katha Hamming, RN [phone] 06/28/21 K[facility] (NOTE) SARS-CoV-2 target nucleic acids are DETECTED.  The SARS-CoV-2 RNA is generally detectable in upper respiratory specimens during the acute phase of infection. Positive results are indicative of the presence of the identified virus, but do not rule out bacterial infection or co-infection with other pathogens not detected by the test. Clinical correlation with patient history and other diagnostic information is necessary to determine patient infection status. The expected result is Negative.  Fact Sheet for Patients: EntrepreneurPulse.com.au  Fact Sheet for Healthcare Providers: IncredibleEmployment.be  This test is not yet approved or cleared by the [place] FDA and  has been authorized for detection and/or diagnosis of SARS-CoV-2 by FDA under an Emergency Use Authorization (EUA).  This EUA will remain in effect (meaning this test can  be used) for the duration of  the COVID-19 declaration under Section 564(b)(1) of the Act, 21 U.S.C. section 360bbb-3(b)(1), unless the authorization is terminated or revoked sooner.     Influenza A by PCR NEGATIVE NEGATIVE Final   Influenza B by PCR NEGATIVE NEGATIVE Final    Comment: (NOTE) The Xpert Xpress SARS-CoV-2/FLU/RSV plus assay is intended as an aid in the diagnosis of influenza from Nasopharyngeal swab specimens and should not be used as a sole basis for treatment. Nasal washings and aspirates are unacceptable for Xpert Xpress SARS-CoV-2/FLU/RSV testing.  Fact Sheet for Patients: EntrepreneurPulse.com.au  Fact Sheet for Healthcare Providers: IncredibleEmployment.be  This test is not yet approved or cleared by the [place] FDA and has been authorized for detection and/or diagnosis of SARS-CoV-2 by FDA under an Emergency Use Authorization (EUA). This EUA will remain in effect  (meaning this test can be used) for the duration of the COVID-19 declaration under Section 564(b)(1) of the Act, 21 U.S.C. section 360bbb-3(b)(1), unless the authorization is terminated or revoked.  Performed at [facility], [place] [address] [place], [place] [address]      Time coordinating discharge: 39 minutes  SIGNED:   Georgette Shell, MD  [facility] 06/29/2021, 10:13 AM

## 2021-06-29 NOTE — Plan of Care (Signed)
Patient discharged per MD order 

## 2021-06-29 NOTE — Progress Notes (Signed)
EEG Completed; Results Pending  

## 2021-07-01 NOTE — Telephone Encounter (Signed)
Called pt no answer. Ok to send in Rx

## 2021-07-02 NOTE — Telephone Encounter (Signed)
Pt stated he was prescribed Rx in hospital and no longer needs the Rx.

## 2021-07-03 DIAGNOSIS — M25511 Pain in right shoulder: Secondary | ICD-10-CM | POA: Diagnosis not present

## 2021-07-03 DIAGNOSIS — M542 Cervicalgia: Secondary | ICD-10-CM | POA: Diagnosis not present

## 2021-07-03 DIAGNOSIS — G5603 Carpal tunnel syndrome, bilateral upper limbs: Secondary | ICD-10-CM | POA: Diagnosis not present

## 2021-07-06 ENCOUNTER — Other Ambulatory Visit: Payer: Self-pay

## 2021-07-06 DIAGNOSIS — R202 Paresthesia of skin: Secondary | ICD-10-CM

## 2021-07-07 ENCOUNTER — Ambulatory Visit (INDEPENDENT_AMBULATORY_CARE_PROVIDER_SITE_OTHER): Payer: PPO | Admitting: Neurology

## 2021-07-07 ENCOUNTER — Other Ambulatory Visit: Payer: Self-pay

## 2021-07-07 DIAGNOSIS — R202 Paresthesia of skin: Secondary | ICD-10-CM

## 2021-07-07 DIAGNOSIS — G5603 Carpal tunnel syndrome, bilateral upper limbs: Secondary | ICD-10-CM

## 2021-07-07 NOTE — Procedures (Signed)
[facility]  [place], [place]  [place], [place] [address] Tel: [phone] Fax:  [phone] Test Date:  07/07/2021  Patient: Glenn Ortega DOB: 1933-11-14 Physician: Narda Amber, DO  Sex: Male Height: 5\' 6"  Ref Phys: Marchia Bond, MD  ID#: [id number]   Technician:    Patient Complaints: This is a 87 year old man referred for evaluation of bilateral hand numbness, tingling, and pain.  NCV & EMG Findings: Extensive electrodiagnostic testing of the right upper extremity and additional studies of the left shows:  Bilateral median sensory responses are absent.  Bilateral ulnar sensory responses are within normal limits. Median motor response on the right is absent, and shows severely prolonged latency(10.2 ms) and reduced amplitude (1.7 mV) on the left.  Left ulnar motor response shows slowed conduction velocity across the elbow (A Elbow-B Elbow, 36 m/s).  Right ulnar motor responses within normal limits.   Severe chronic motor axonal loss changes are seen affecting bilateral abductor pollicis brevis muscles.  Chronic motor axonal loss changes are also seen in the left ulnar innervated muscles.  There is no evidence of active denervation.  Impression: Bilateral median neuropathy at or distal to the wrist, consistent with a clinical diagnosis of carpal tunnel syndrome.  Overall, these findings are very severe in degree electrically. Left ulnar neuropathy with slowing across the elbow, purely demyelinating, mild.   ___________________________ Narda Amber, DO    Nerve Conduction Studies Anti Sensory Summary Table   Stim Site NR Peak (ms) Norm Peak (ms) P-T Amp (V) Norm P-T Amp  Left Median Anti Sensory (2nd Digit)  32C  Wrist NR  <3.8  >10  Right Median Anti Sensory (2nd Digit)  32C  Wrist NR  <3.8  >10  Left Ulnar Anti Sensory (5th Digit)  32C  Wrist    3.2 <3.2 19.6 >5  Right Ulnar Anti Sensory (5th Digit)  32C  Wrist    3.2 <3.2 21.6 >5   Motor  Summary Table   Stim Site NR Onset (ms) Norm Onset (ms) O-P Amp (mV) Norm O-P Amp Site1 Site2 Delta-0 (ms) Dist (cm) Vel (m/s) Norm Vel (m/s)  Left Median Motor (Abd Poll Brev)  32C  Wrist    10.2 <4.0 1.7 >5 Elbow Wrist 5.3 30.0 57 >50  Elbow    15.5  1.7         Right Median Motor (Abd Poll Brev)  32C  Wrist NR  <4.0  >5 Elbow Wrist  0.0  >50  Elbow NR            Left Ulnar Motor (Abd Dig Minimi)  32C  Wrist    2.7 <3.1 7.5 >7 B Elbow Wrist 3.3 21.0 64 >50  B Elbow    6.0  7.0  A Elbow B Elbow 2.8 10.0 36 >50  A Elbow    8.8  7.1         Right Ulnar Motor (Abd Dig Minimi)  32C  Wrist    2.6 <3.1 8.8 >7 B Elbow Wrist 3.8 22.0 58 >50  B Elbow    6.4  8.1  A Elbow B Elbow 2.0 10.0 50 >50  A Elbow    8.4  8.0          EMG   Side Muscle Ins Act Fibs Psw Fasc Number Recrt Dur Dur. Amp Amp. Poly Poly. Comment  Right 1stDorInt Nml Nml Nml Nml Nml Nml Nml Nml Nml Nml Nml Nml N/A  Right Abd [place]  Brev Nml Nml Nml Nml SMU Rapid All 1+ All 1+ All 1+ ATR  Right PronatorTeres Nml Nml Nml Nml Nml Nml Nml Nml Nml Nml Nml Nml N/A  Right Biceps Nml Nml Nml Nml Nml Nml Nml Nml Nml Nml Nml Nml N/A  Right Triceps Nml Nml Nml Nml Nml Nml Nml Nml Nml Nml Nml Nml N/A  Right Deltoid Nml Nml Nml Nml Nml Nml Nml Nml Nml Nml Nml Nml N/A  Left 1stDorInt Nml Nml Nml Nml 1- Rapid Some 1+ Some 1+ Some 1+ N/A  Left Abd Poll Brev Nml Nml Nml Nml SMU Rapid All 1+ All 1+ All 1+ N/A  Left PronatorTeres Nml Nml Nml Nml Nml Nml Nml Nml Nml Nml Nml Nml N/A  Left Biceps Nml Nml Nml Nml Nml Nml Nml Nml Nml Nml Nml Nml N/A  Left Triceps Nml Nml Nml Nml Nml Nml Nml Nml Nml Nml Nml Nml N/A  Left Deltoid Nml Nml Nml Nml Nml Nml Nml Nml Nml Nml Nml Nml N/A  Left FlexCarpiUln Nml Nml Nml Nml 1- None Some 1+ Some 1+ Some 1+ N/A      Waveforms:

## 2021-07-29 DIAGNOSIS — G5603 Carpal tunnel syndrome, bilateral upper limbs: Secondary | ICD-10-CM | POA: Diagnosis not present

## 2021-08-18 ENCOUNTER — Telehealth: Payer: Self-pay

## 2021-08-18 NOTE — Telephone Encounter (Signed)
Caller reports he sustained a cut on ankle almost 2 weeks ago. Not sure how he sustained cut. Bacitracin applied, mild pain, denies s/s of infection. 1/4 inch cut.  08/18/2021 10:44:35 AM SEE PCP WITHIN 3 DAYS Yes Cisneros, RN, [place] Understands Yes  08/18/21 1348: No noted appt at this time. Pt states he is not able to schedule an appt at this time b/c he is driving; states he will call back. Appt can be scheduled with any available provider to meet the pt's scheduling needs.

## 2021-08-19 ENCOUNTER — Other Ambulatory Visit: Payer: Self-pay

## 2021-08-19 DIAGNOSIS — G5603 Carpal tunnel syndrome, bilateral upper limbs: Secondary | ICD-10-CM | POA: Diagnosis not present

## 2021-08-20 ENCOUNTER — Ambulatory Visit (INDEPENDENT_AMBULATORY_CARE_PROVIDER_SITE_OTHER): Payer: PPO | Admitting: Adult Health

## 2021-08-20 ENCOUNTER — Telehealth: Payer: Self-pay | Admitting: *Deleted

## 2021-08-20 ENCOUNTER — Encounter: Payer: Self-pay | Admitting: Adult Health

## 2021-08-20 VITALS — BP 126/60 | HR 88 | Temp 96.1°F | Ht 66.0 in | Wt 192.0 lb

## 2021-08-20 DIAGNOSIS — S0990XA Unspecified injury of head, initial encounter: Secondary | ICD-10-CM

## 2021-08-20 DIAGNOSIS — S51012A Laceration without foreign body of left elbow, initial encounter: Secondary | ICD-10-CM

## 2021-08-20 DIAGNOSIS — S50312A Abrasion of left elbow, initial encounter: Secondary | ICD-10-CM | POA: Diagnosis not present

## 2021-08-20 DIAGNOSIS — S91002A Unspecified open wound, left ankle, initial encounter: Secondary | ICD-10-CM

## 2021-08-20 NOTE — Telephone Encounter (Signed)
   Glenn Ortega Pre-operative Risk Assessment    Patient Name: Glenn Ortega  DOB: 1934-01-09 MRN: [id number]  HEARTCARE STAFF:  - IMPORTANT!!!!!! Under Visit Info/Reason for Call, type in Other and utilize the format Clearance MM/DD/YY or Clearance TBD. Do not use dashes or single digits. - Please review there is not already an duplicate clearance open for this procedure. - If request is for dental extraction, please clarify the # of teeth to be extracted. - If the patient is currently at the dentist's office, call Pre-Op Callback Staff (MA/nurse) to input urgent request.  - If the patient is not currently in the dentist office, please route to the Pre-Op pool.  Request for surgical clearance:  What type of surgery is being performed? Rt Carpal Tunnel Release  When is this surgery scheduled? TBD  What type of clearance is required (medical clearance vs. Pharmacy clearance to hold med vs. Both)? Both  Are there any medications that need to be held prior to surgery and how long? Eliquis  Practice name and name of physician performing surgery? Glenn Ortega, Dr Percell Miller  What is the office phone number? [place].   What is the office fax number? [place].   Anesthesia type (None, local, MAC, general) ? Local, MAC   Glenn Ortega 08/20/2021, 5:36 PM  _________________________________________________________________   (provider comments below)

## 2021-08-20 NOTE — Progress Notes (Addendum)
Subjective:    Patient ID: Glenn Ortega, male    DOB: Feb 13, 1934, 87 y.o.   MRN: [id number]  HPI 87 year old male who  has a past medical history of Atrial fibrillation ([place]), Back pain, GERD (gastroesophageal reflux disease), Hyperlipidemia, Hypertension, Spinal stenosis, and Thyroid trouble.  Was originally being seen for a wound on his left inner ankle that is been present for roughly 3 weeks.  He wanted to make sure it was not infected.  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  He is on Eliquis.  He has abrasions on his left elbow and hematoma on the crown of his head.  He denies headaches, blurred vision, or confusion   Review of Systems See HPI   Past Medical History:  Diagnosis Date   Atrial fibrillation (HCC)    Back pain    GERD (gastroesophageal reflux disease)    Hyperlipidemia    Hypertension    EKG and chest x ray 8/12 [facility]/states had stress test 2 yrs ago- doesnt remember where- not in [facility]   Spinal stenosis    lumbar   Thyroid trouble     Social History   Socioeconomic History   Marital status: Widowed    Spouse name: [name]   Number of children: Not on file   Years of education: Not on file   Highest education level: Not on file  Occupational History   Occupation: Retired  Tobacco Use   Smoking status: Never   Smokeless tobacco: Never   Tobacco comments:    per patient he never smoke. 11/25/2014 /rc  Vaping Use   Vaping Use: Never used  Substance and Sexual Activity   Alcohol use: Yes    Alcohol/week: 2.0 standard drinks    Types: 2 Standard drinks or equivalent per week    Comment: socially- occ beer   Drug use: No   Sexual activity: Not Currently  Other Topics Concern   Not on file  Social History Narrative   Works 3  nights a week at [employer].    Previously worked as a Freight forwarder for [employer].   2 caffeinated drinks per day. Does not exercise he says secondary to back problems.  Lives in a one story home.     Has 2 children.  Education: some college. He is an is in [name] from [place]   Wife passed from [place] in 2021.    Social Determinants of Health   Financial Resource Strain: Low Risk    Difficulty of Paying Living Expenses: Not hard at all  Food Insecurity: No Food Insecurity   Worried About Charity fundraiser in the Last Year: Never true   [place] in the Last Year: Never true  Transportation Needs: No Transportation Needs   Lack of Transportation (Medical): No   Lack of Transportation (Non-Medical): No  Physical Activity: Insufficiently Active   Days of Exercise per Week: 3 days   Minutes of Exercise per Session: 30 min  Stress: No Stress Concern Present   Feeling of Stress : Not at all  Social Connections: Moderately Integrated   Frequency of Communication with Friends and Family: More than three times a week  Frequency of Social Gatherings with Friends and Family: More than three times a week   Attends Religious Services: 1 to 4 times per year   Active Member of Clubs or Organizations: No   Attends Archivist Meetings: 1 to 4 times per year   Marital Status: Widowed  Intimate Partner Violence: Not At Risk   Fear of Current or Ex-Partner: No   Emotionally Abused: No   Physically Abused: No   Sexually Abused: No    Past Surgical History:  Procedure Laterality Date   COLONOSCOPY     CYSTOSCOPY  12/30/2011   Procedure: CYSTOSCOPY;  Surgeon: Reece Packer, MD;  Location: [facility];  Service: Urology;  Laterality: N/A;   EYE SURGERY Bilateral 06/18/2017   cataract sx with lens implant   HERNIA REPAIR  2013   HERNIA REPAIR Right    age  77   LUMBAR LAMINECTOMY/DECOMPRESSION MICRODISCECTOMY N/A 11/09/2013   Procedure: Lumbar  Laminectomy/Decompression, Microdiscectomy Lumbar Three-Four, Four-Five, with Coflex;  Surgeon: Faythe Ghee, MD;  Location: [facility];  Service: Neurosurgery;  Laterality: N/A;  Lumbar Laminectomy/Decompression, Microdiscectomy Lumbar Three-Four, Four-Five, with Coflex   PAROTIDECTOMY Left 11/17/2015   Procedure: LEFT PAROTIDECTOMY;  Surgeon: Izora Gala, MD;  Location: [facility];  Service: ENT;  Laterality: Left;   PROSTATE SURGERY  2013   SALIVARY GLAND SURGERY     TONSILLECTOMY     trigger thumb  1980   right    Family History  Problem Relation Age of Onset   Hypertension Other    Cancer Mother        bone?   Myasthenia gravis Father    Obesity Father    Heart disease Brother     Allergies  Allergen Reactions   Penicillins Swelling    CHILDHOOD ALLERGY Has patient had a PCN reaction causing immediate rash, facial/tongue/throat swelling, SOB or lightheadedness with hypotension: Yes Has patient had a PCN reaction causing severe rash involving mucus membranes or skin necrosis: No Has patient had a PCN reaction that required hospitalization: No Has patient had a PCN reaction occurring within the last 10 years: No If all of the above answers are "NO", then may proceed with Cephalosporin use.    Gabapentin Other (See Comments)    "MADE ME FEEL CRAZY"   Lisinopril Cough   Penicillin G Hives    Current Outpatient Medications on File Prior to Visit  Medication Sig Dispense Refill   apixaban (ELIQUIS) 5 MG TABS tablet Take 1 tablet (5 mg total) by mouth 2 (two) times daily. 180 tablet 1   atorvastatin (LIPITOR) 10 MG tablet TAKE ONE TABLET BY MOUTH DAILY (Patient taking differently: Take 10 mg by mouth at bedtime.) 90 tablet 3   levothyroxine (SYNTHROID) 50 MCG tablet TAKE ONE TABLET BY MOUTH DAILY (Patient taking differently: Take 50 mcg by mouth every morning.) 90 tablet 1   losartan (COZAAR) 25 MG tablet TAKE 1 TABLET BY MOUTH DAILY (Patient taking  differently: Take 25 mg by mouth every morning.) 90 tablet 2   Multiple Vitamins-Minerals (PRESERVISION AREDS 2 PO) Take 1 capsule by mouth 2 (two) times daily.     No current facility-administered medications on file prior to visit.    BP 126/60   Pulse 88   Temp (!) 96.1 F (35.6 C) (Oral)   Ht 5\' 6"  (1.676 m)   Wt 192 lb (87.1 kg)   SpO2 97%   BMI 30.99 kg/m  Objective:   Physical Exam Vitals reviewed.  Constitutional:      Appearance: Normal appearance.  Cardiovascular:     Rate and Rhythm: Normal rate and regular rhythm.     Pulses: Normal pulses.     Heart sounds: Normal heart sounds.  Pulmonary:     Effort: Pulmonary effort is normal.     Breath sounds: Normal breath sounds.  Skin:    General: Skin is warm and dry.     Findings: Signs of injury present.       Neurological:     General: No focal deficit present.     Mental Status: He is alert and oriented to person, place, and time.  Psychiatric:        Mood and Affect: Mood normal.        Behavior: Behavior normal.        Thought Content: Thought content normal.        Judgment: Judgment normal.      Assessment & Plan:  1. Traumatic injury of head, initial encounter -Was able to order head CT for next day at 11 AM.  Patient refused due to having to work.  Does not want to have head CT done at this time.  It was strongly advised that he find time and date to have the CAT scan done but he continued to refuse.  Risks reviewed.  Was advised to go to the emergency room if he develops confusion, headaches, or blurred vision  2. Wound of left ankle, initial encounter -No signs of infection noted.  Wound does appear to be healing.  3. Skin tear of left elbow without complication, initial encounter -Wound cleaned and wrapped with gauze.  Advised to keep wound clean and dry.  4. Abrasion of left elbow, initial encounter -Wound cleaned and wrapped with gauze.  Advised to keep wound clean and dry.  [name], NP

## 2021-08-21 ENCOUNTER — Inpatient Hospital Stay: Admission: RE | Admit: 2021-08-21 | Payer: PPO | Source: Ambulatory Visit

## 2021-08-23 NOTE — Telephone Encounter (Signed)
   Name: Glenn Ortega  DOB: 12-30-1933  MRN: [id number]  Primary Cardiologist: Shelva Majestic, MD  Chart reviewed as part of pre-operative protocol coverage. Because of Glenn Ortega's past medical history and time since last visit, he will require a follow-up visit in order to better assess preoperative cardiovascular risk.  Pre-op covering staff: - Please schedule appointment and call patient to inform them. If patient already had an upcoming appointment within acceptable timeframe, please add "pre-op clearance" to the appointment notes so provider is aware. - Please contact requesting surgeon's office via preferred method (i.e, phone, fax) to inform them of need for appointment prior to surgery.  I will route to pharmacy for input on holding anticoagulant agent as requested below so that this information is available to the clearing provider at time of patient's appointment.   Abigail Butts, PA-C  08/23/2021, 11:16 PM

## 2021-08-24 NOTE — Telephone Encounter (Signed)
I s/w the pt and he is agreeable to appt for pr eo clearance. Pt states he needs appt after 3 pm. Pt has been scheduled to see Dr. Claiborne Billings 08/26/21 @ 4:20. Pt is grateful for the help and the call. I will forward notes to MD for upcoming appt. Will send FYI to requesting office pt has appt 08/26/21.

## 2021-08-24 NOTE — Telephone Encounter (Signed)
Patient with diagnosis of A Fib on Eliquis for anticoagulation.    Note: patient overdue for office visit  Procedure: Rt Carpal Tunnel Release Date of procedure: TBD   CHA2DS2-VASc Score = 3  This indicates a 3.2% annual risk of stroke. The patient's score is based upon: CHF History: 0 HTN History: 1 Diabetes History: 0 Stroke History: 0 Vascular Disease History: 0 Age Score: 2 Gender Score: 0    CrCl 61 mL/min Platelet count 146K   Per office protocol, patient can hold Eliquis for 2 days prior to procedure.

## 2021-08-26 ENCOUNTER — Ambulatory Visit: Payer: PPO | Admitting: Cardiovascular Disease

## 2021-08-26 ENCOUNTER — Other Ambulatory Visit: Payer: Self-pay

## 2021-08-26 ENCOUNTER — Encounter: Payer: Self-pay | Admitting: Cardiovascular Disease

## 2021-08-26 DIAGNOSIS — I10 Essential (primary) hypertension: Secondary | ICD-10-CM

## 2021-08-26 DIAGNOSIS — I4891 Unspecified atrial fibrillation: Secondary | ICD-10-CM

## 2021-08-26 DIAGNOSIS — Z01818 Encounter for other preprocedural examination: Secondary | ICD-10-CM

## 2021-08-26 DIAGNOSIS — I4811 Longstanding persistent atrial fibrillation: Secondary | ICD-10-CM | POA: Diagnosis not present

## 2021-08-26 DIAGNOSIS — E785 Hyperlipidemia, unspecified: Secondary | ICD-10-CM

## 2021-08-26 DIAGNOSIS — R600 Localized edema: Secondary | ICD-10-CM | POA: Diagnosis not present

## 2021-08-26 DIAGNOSIS — Z7901 Long term (current) use of anticoagulants: Secondary | ICD-10-CM | POA: Diagnosis not present

## 2021-08-26 DIAGNOSIS — E039 Hypothyroidism, unspecified: Secondary | ICD-10-CM

## 2021-08-26 MED ORDER — METOPROLOL SUCCINATE ER 25 MG PO TB24: 12.5000 mg | ORAL_TABLET | Freq: Every day | ORAL | 3 refills | Status: DC

## 2021-08-26 MED ORDER — FUROSEMIDE 20 MG PO TABS: ORAL_TABLET | ORAL | 3 refills | Status: DC

## 2021-08-26 NOTE — Progress Notes (Signed)
Cardiology Office Note    Date:  09/13/2021   ID:  Glenn Ortega, DOB [birth year], MRN [id number]  PCP:  [name], NP  Cardiologist:  Shelva Majestic, MD   New cardiology evaluation for pre-oprerative clearance   History of Present Illness:  Glenn Ortega is a 87 y.o. male who presents to the office today for preoperative clearance prior to undergoing carpal tunnel surgery.  [name] has a history of hypertension, hypothyroidism, and hyperlipidemia.  He developed atrial fibrillation in September 2021.  An echo Doppler study showed an EF of 55 to 60% without regional wall motion abnormalities.  There was no significant valvular abnormality.  He was started on Eliquis for anticoagulation.  He was seen by Dr. Irish Lack on July 24, 2020 and his ECG from June 07, 2020 showed atrial fibrillation with controlled ventricular response.  He was on a regimen of atorvastatin 10 mg, losartan 25 mg, and levothyroxine 50 mcg in addition to his Eliquis 5 mg twice daily.  In September 2022, while in [place] watch a Tour manager game, after checking in just hotel he had an episode of urinary incontinence and fell backwards onto the bathroom floor.  Rather than being seen in the local ER he and his son drove back to [place] where he was evaluated in the [place], ER.  CT of his head was unremarkable.  He also was evaluated by neurology.  An EEG was normal.  He subsequently was seen by Dr. Posey Pronto of [facility] neurology due to bilateral hand numbness, tingling and pain.  He was ultimately found to have bilateral median neuropathy at or distal to the wrist consistent with a clinical diagnosis of carpal tunnel syndrome.  He also had left ulnar neuropathy.  As result, he would need to undergo carpal tunnel surgery.  I previously cared for his wife who passed today in 2021.  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  He typically works 40 hours/week.  He denies any chest  pain or shortness of breath and he remains active.  He admits to bilateral lower extremity edema.  He states that he has lost approximately 25 pounds over the past year after his wife's death.  He denies any presyncope or syncope.  He presents for evaluation.   Past Medical History:  Diagnosis Date   Atrial fibrillation (HCC)    Back pain    GERD (gastroesophageal reflux disease)    Hyperlipidemia    Hypertension    EKG and chest x ray 8/12 EPIC/states had stress test 2 yrs ago- doesnt remember where- not in EPIC   Spinal stenosis    lumbar   Thyroid trouble     Past Surgical History:  Procedure Laterality Date   COLONOSCOPY     CYSTOSCOPY  12/30/2011   Procedure: CYSTOSCOPY;  Surgeon: Reece Packer, MD;  Location: [facility];  Service: Urology;  Laterality: N/A;   EYE SURGERY Bilateral 06/18/2017   cataract sx with lens implant   HERNIA REPAIR  2013   HERNIA REPAIR Right    age  40   LUMBAR LAMINECTOMY/DECOMPRESSION MICRODISCECTOMY N/A 11/09/2013   Procedure: Lumbar Laminectomy/Decompression, Microdiscectomy Lumbar Three-Four, Four-Five, with Coflex;  Surgeon: Faythe Ghee, MD;  Location: [facility];  Service: Neurosurgery;  Laterality: N/A;  Lumbar Laminectomy/Decompression, Microdiscectomy Lumbar Three-Four, Four-Five, with Coflex   PAROTIDECTOMY Left 11/17/2015   Procedure: LEFT PAROTIDECTOMY;  Surgeon: Izora Gala, MD;  Location: [facility];  Service: ENT;  Laterality: Left;   PROSTATE SURGERY  2013   SALIVARY GLAND SURGERY     TONSILLECTOMY     trigger thumb  1980   right    Current Medications: Outpatient Medications Prior to Visit  Medication Sig Dispense Refill   apixaban (ELIQUIS) 5 MG TABS tablet Take 1 tablet (5 mg total) by mouth 2 (two) times daily. 180 tablet 1   atorvastatin (LIPITOR) 10 MG tablet TAKE ONE TABLET BY MOUTH DAILY (Patient taking differently: Take 10 mg by mouth at bedtime.) 90 tablet 3   levothyroxine (SYNTHROID) 50 MCG tablet  TAKE ONE TABLET BY MOUTH DAILY (Patient taking differently: Take 50 mcg by mouth every morning.) 90 tablet 1   Multiple Vitamins-Minerals (PRESERVISION AREDS 2 PO) Take 1 capsule by mouth 2 (two) times daily.     losartan (COZAAR) 25 MG tablet TAKE 1 TABLET BY MOUTH DAILY (Patient taking differently: Take 25 mg by mouth every morning.) 90 tablet 2   No facility-administered medications prior to visit.     Allergies:   Penicillins, Gabapentin, Lisinopril, and Penicillin g   Social History   Socioeconomic History   Marital status: Widowed    Spouse name: Glenn Ortega   Number of children: Not on file   Years of education: Not on file   Highest education level: Not on file  Occupational History   Occupation: Retired  Tobacco Use   Smoking status: Never   Smokeless tobacco: Never   Tobacco comments:    per patient he never smoke. 11/25/2014 /rc  Vaping Use   Vaping Use: Never used  Substance and Sexual Activity   Alcohol use: Yes    Alcohol/week: 2.0 standard drinks    Types: 2 Standard drinks or equivalent per week    Comment: socially- occ beer   Drug use: No   Sexual activity: Not Currently  Other Topics Concern   Not on file  Social History Narrative   Works 3 nights a week at [employer].    Previously worked as a Freight forwarder for [employer].   2 caffeinated drinks per day. Does not exercise he says secondary to back problems.  Lives in a one story home.     Has 2 children.  Education: some college. He is an is in [facility] from [place]   Wife passed from [place] in 2021.    Social Determinants of Health   Financial Resource Strain: Low Risk    Difficulty of Paying Living Expenses: Not hard at all  Food Insecurity: No Food Insecurity   Worried About Charity fundraiser in the Last Year: Never true   [place] in the Last Year: Never true  Transportation Needs: No Transportation Needs   Lack of Transportation (Medical): No   Lack of Transportation  (Non-Medical): No  Physical Activity: Insufficiently Active   Days of Exercise per Week: 3 days   Minutes of Exercise per Session: 30 min  Stress: No Stress Concern Present   Feeling of Stress : Not at all  Social Connections: Moderately Integrated   Frequency of Communication with Friends and Family: More than three times a week   Frequency of Social Gatherings with Friends and Family: More than three times a week   Attends Religious Services: 1 to 4 times per year   Active Member of [employer] or Organizations: No   Attends Archivist Meetings: 1 to 4 times per year   Marital Status: Widowed  Additional social history is notable that he was born in [birth year] and for walk away [place].  He works driving truck with parts for [employer] and typically works from 7 until 3:30 PM daily.  Family History:  The patient's family history includes Cancer in his mother; Heart disease in his brother; Hypertension in an other family member; Myasthenia gravis in his father; Obesity in his father.   ROS General: Negative; No fevers, chills, or night sweats;  HEENT: Negative; No changes in vision or hearing, sinus congestion, difficulty swallowing Pulmonary: Negative; No cough, wheezing, shortness of breath, hemoptysis Cardiovascular: See HPI GI: Negative; No nausea, vomiting, diarrhea, or abdominal pain GU: Negative; No dysuria, hematuria, or difficulty voiding Musculoskeletal: Negative; no myalgias, joint pain, or weakness Hematologic/Oncology: Negative; no easy bruising, bleeding Endocrine: Negative; no heat/cold intolerance; no diabetes Neuro: Negative; no changes in balance, headaches Skin: Negative; No rashes or skin lesions Psychiatric: Negative; No behavioral problems, depression Sleep: Negative; No snoring, daytime sleepiness, hypersomnolence, bruxism, restless legs, hypnogognic hallucinations, no cataplexy Other comprehensive 14 point system review is negative.   PHYSICAL  EXAM:   VS:  BP 122/74   Pulse 80   Ht 5' 6"  (1.676 m)   Wt 192 lb 12.8 oz (87.5 kg)   SpO2 97%   BMI 31.12 kg/m     Repeat blood pressure by me was 142/72  Wt Readings from Last 3 Encounters:  08/26/21 192 lb 12.8 oz (87.5 kg)  08/20/21 192 lb (87.1 kg)  06/27/21 200 lb (90.7 kg)    General: Alert, oriented, no distress.  Skin: normal turgor, no rashes, warm and dry HEENT: Normocephalic, atraumatic. Pupils equal round and reactive to light; sclera anicteric; extraocular muscles intact; Nose without nasal septal hypertrophy Mouth/Parynx benign; Mallinpatti scale 3 Neck: No JVD, no carotid bruits; normal carotid upstroke Lungs: clear to ausculatation and percussion; no wheezing or rales Chest wall: without tenderness to palpitation Heart: PMI not displaced, irregularly irregular with ventricular rate in the 80s, s1 s2 normal, 1/6 systolic murmur, no diastolic murmur, no rubs, gallops, thrills, or heaves Abdomen: soft, nontender; no hepatosplenomehaly, BS+; abdominal aorta nontender and not dilated by palpation. Back: no CVA tenderness Pulses 2+ Musculoskeletal: full range of motion, normal strength, no joint deformities Extremities: Bilateral lower extremity edema with venous stasis changes; no clubbing, cyanosis; Homan's sign negative  Neurologic: grossly nonfocal; Cranial nerves grossly wnl Psychologic: Normal mood and affect   Studies/Labs Reviewed:   EKG:  EKG is ordered today.  ECG (independently read by me):  Atrial fibrillation at 80, QTc 459 msec  Recent Labs: BMP Latest Ref Rng & Units 06/28/2021 06/27/2021 03/24/2021  Glucose 70 - 99 mg/dL 90 97 116(H)  BUN 8 - 23 mg/dL 19 18 23   Creatinine 0.61 - 1.24 mg/dL 1.09 1.02 1.32(H)  BUN/Creat Ratio 6 - 22 (calc) - - -  Sodium 135 - 145 mmol/L 139 136 136  Potassium 3.5 - 5.1 mmol/L 3.6 3.4(L) 5.0  Chloride 98 - 111 mmol/L 106 105 102  CO2 22 - 32 mmol/L 25 20(L) 26  Calcium 8.9 - 10.3 mg/dL 8.3(L) 8.4(L) 8.8(L)      Hepatic Function Latest Ref Rng & Units 06/28/2021 06/27/2021 06/18/2020  Total Protein 6.5 - 8.1 g/dL 6.4(L) 6.7 7.0  Albumin 3.5 - 5.0 g/dL 3.5 3.7 -  AST 15 - 41 U/L 17 17 13   ALT 0 - 44 U/L 13 12 12   Alk Phosphatase 38 - 126 U/L 33(L) 35(L) -  Total  Bilirubin 0.3 - 1.2 mg/dL 0.8 1.0 0.6  Bilirubin, Direct 0.0 - 0.3 mg/dL - - -    CBC Latest Ref Rng & Units 06/28/2021 06/27/2021 03/24/2021  WBC 4.0 - 10.5 K/uL 5.7 6.6 9.4  Hemoglobin 13.0 - 17.0 g/dL 14.0 14.7 12.9(L)  Hematocrit 39.0 - 52.0 % 41.6 45.1 39.4  Platelets 150 - 400 K/uL 146(L) 156 236   Lab Results  Component Value Date   MCV 84.4 06/28/2021   MCV 85.7 06/27/2021   MCV 86.6 03/24/2021   Lab Results  Component Value Date   TSH 1.61 06/18/2020   Lab Results  Component Value Date   HGBA1C 5.3 06/28/2021     BNP No results found for: BNP  ProBNP No results found for: PROBNP   Lipid Panel     Component Value Date/Time   CHOL 132 06/28/2021 0622   CHOL 188 07/15/2020 0848   TRIG 48 06/28/2021 0622   HDL 57 06/28/2021 0622   HDL 87 07/15/2020 0848   CHOLHDL 2.3 06/28/2021 0622   VLDL 10 06/28/2021 0622   LDLCALC 65 06/28/2021 0622   LDLCALC 88 07/15/2020 0848   LABVLDL 13 07/15/2020 0848     RADIOLOGY: No results found.   Additional studies/ records that were reviewed today include:  I reviewed his prior records of Dr. Irish Lack , as well as his ER records, images, and neurologic evaluation in September 2022.  ECHO: 06/28/2021 IMPRESSIONS   1. Saline microcavitation study with no clear shunt but suboptimal and  PFO cannot be excluded.   2. Left ventricular ejection fraction, by estimation, is 55 to 60%. The  left ventricle has normal function. The left ventricle has no regional  wall motion abnormalities. Left ventricular diastolic function could not  be evaluated.   3. Right ventricular systolic function is mildly reduced. The right  ventricular size is mildly enlarged. There is normal  pulmonary artery  systolic pressure.   4. Left atrial size was mildly dilated.   5. Right atrial size was moderately dilated.   6. The mitral valve is normal in structure. Trivial mitral valve  regurgitation. No evidence of mitral stenosis.   7. Tricuspid valve regurgitation is moderate.   8. The aortic valve is tricuspid. Aortic valve regurgitation is not  visualized. Mild aortic valve sclerosis is present, with no evidence of  aortic valve stenosis.   9. The inferior vena cava is normal in size with greater than 50%  respiratory variability, suggesting right atrial pressure of 3 mmHg.   ASSESSMENT:    1. Preoperative clearance   2. Longstanding persistent atrial fibrillation ([place])   3. Essential hypertension   4. Hypothyroidism, unspecified type   5. Anticoagulation adequate   6. Bilateral lower extremity edema   7. Hyperlipidemia, unspecified hyperlipidemia type     PLAN:  Mr. Abel "Annye Asa is an 87 year old gentleman who is the husband of my former patient Ms. Norma Fredrickson.  He has a history of hypertension, hyperlipidemia, spinal stenosis, GERD, and is in need for carpal tunnel surgery.  He denies any history of chest pain.  His echo Doppler studies have shown normal systolic function.  He has been documented to be in atrial fibrillation which is permanent and has been on Eliquis anticoagulation.  He recently had fallen which led to his ER evaluation in September 2022.  He was admitted overnight.  He is CT of his head was unremarkable.  MRI of his brain did not show acute intracranial abnormality although there  was mild chronic microvascular ischemic disease for his age.  There was sequelae of prior left parotid ectomy.  He has subsequently been diagnosed with bilateral median neuropathy at or distal to the wrist and left ulnar neuropathy and will need carpal tunnel surgery.  His blood pressure today on presentation was normal at 122/74 but on repeat by me was 142/72.  His atrial  fibrillation rate is in the 80s.  He has bilateral lower extremity edema with venous stasis changes.  Recent TSH level was normal at 1.6.  I have given him clearance to undergo his carpal tunnel surgery.  I suggested that he hold his Eliquis for at least 2 to possibly 3 days rather than the 7 days which he had initially been told.  With his lower extremity edema I have given him a prescription for Lasix 20 mg to take for the next 4 days and then 20 mg every other day.  I am also adding low-dose metoprolol succinate 12.5 mg daily for additional rate control and blood pressure stability.  He will continue his current dose of levothyroxine 50 mcg.  He is on atorvastatin 10 mg for hyperlipidemia.  I will see him in several months for follow-up evaluation and further recommendations will be made at that time.   Medication Adjustments/Labs and Tests Ordered: Current medicines are reviewed at length with the patient today.  Concerns regarding medicines are outlined above.  Medication changes, Labs and Tests ordered today are listed in the Patient Instructions below. Patient Instructions  Medication Instructions:  Take furosemide 20 mg each day for 4 days. Then take 1 pill every other day. When your swelling is gone, take 1 tablet as needed for any swelling you may have. Take metoprolol succinate 12. 5 mg (1/2 tablet) each day.  Stop taking the eliquis 3 days prior to your surgery.  *If you need a refill on your cardiac medications before your next appointment, please call your pharmacy*   Follow-Up: At [facility], you and your health needs are our priority.  As part of our continuing mission to provide you with exceptional heart care, we have created designated Provider Care Teams.  These Care Teams include your primary Cardiologist (physician) and Advanced Practice Providers (APPs -  Physician Assistants and Nurse Practitioners) who all work together to provide you with the care you need, when you need  it.  We recommend signing up for the patient portal called "MyChart".  Sign up information is provided on this After Visit Summary.  MyChart is used to connect with patients for Virtual Visits (Telemedicine).  Patients are able to view lab/test results, encounter notes, upcoming appointments, etc.  Non-urgent messages can be sent to your provider as well.   To learn more about what you can do with MyChart, go to [email or web address].    Your next appointment:   2 month(s)   The format for your next appointment:   In Person  Provider:   Shelva Majestic, MD    Other Instructions You are cleared for surgery. Please follow medication instructions.   Signed, Shelva Majestic, MD  09/13/2021 9:19 AM    [facility] [address] [place], [place], [place]  [address] Phone: [phone]

## 2021-08-26 NOTE — Patient Instructions (Addendum)
Medication Instructions:  Take furosemide 20 mg each day for 4 days. Then take 1 pill every other day. When your swelling is gone, take 1 tablet as needed for any swelling you may have. Take metoprolol succinate 12. 5 mg (1/2 tablet) each day.  Stop taking the eliquis 3 days prior to your surgery.  *If you need a refill on your cardiac medications before your next appointment, please call your pharmacy*   Follow-Up: At [facility], you and your health needs are our priority.  As part of our continuing mission to provide you with exceptional heart care, we have created designated Provider Care Teams.  These Care Teams include your primary Cardiologist (physician) and Advanced Practice Providers (APPs -  Physician Assistants and Nurse Practitioners) who all work together to provide you with the care you need, when you need it.  We recommend signing up for the patient portal called "MyChart".  Sign up information is provided on this After Visit Summary.  MyChart is used to connect with patients for Virtual Visits (Telemedicine).  Patients are able to view lab/test results, encounter notes, upcoming appointments, etc.  Non-urgent messages can be sent to your provider as well.   To learn more about what you can do with MyChart, go to [email or web address].    Your next appointment:   2 month(s)   The format for your next appointment:   In Person  Provider:   Shelva Majestic, MD    Other Instructions You are cleared for surgery. Please follow medication instructions.

## 2021-09-04 NOTE — Telephone Encounter (Signed)
Patient called stating Glenn Ortega never received his clearance for surgery.

## 2021-09-04 NOTE — Telephone Encounter (Signed)
Glenn Ortega. Creson "[name]" was recently seen by you in the clinic on 08/26/2021.  This was a follow-up for his atrial fibrillation and preoperative cardiac evaluation.  He has sent a note to the preoperative pool about his clearance.  It looks like you are still finishing up your note.  I am just sending this message as a reminder to forward your preoperative cardiac clearance to Dr. Percell Miller at [facility] for his upcoming right carpal tunnel release, when you have completed the note.  Thank you for your help,  Jossie Ng. Westly Hinnant NP-C    09/04/2021, 8:35 AM [place] [address] Office [phone] Fax [phone]

## 2021-09-07 NOTE — Telephone Encounter (Signed)
Pt called the office to check on his pre op clearance. I assured the pt that we are just waiting for Dr. Claiborne Billings to finish his ov note with the clearance. I assured the pt that once we have the clearance notes all intact we will be sure to let him know as well. I did assure the pt that that I have also updated the surgeon's office as well just waiting on MD to finis his ov note with clearance. Pt thanked me for the help.

## 2021-09-07 NOTE — Telephone Encounter (Signed)
Our office received another duplicate clearance request. We have already the clearance request in process. At this time the pt recently saw the cardiologist. We apologize for any delay, however, we are just waiting on the cardiologist to finish his note from the appt. Once we have the completed ov note giving clearance we will be happy to fax notes to your office. Again we do apologize for any delays.

## 2021-09-08 ENCOUNTER — Other Ambulatory Visit: Payer: Self-pay | Admitting: Adult Health

## 2021-09-08 NOTE — Telephone Encounter (Signed)
Hi Dr. Claiborne Billings. You saw this patient on 08/26/2021 for pre-op evaluation for upcoming carpal tunnel surgery but I am unable to see your full note at this time. Is he OK to proceed with surgery?   Please route response to P CV DIV PROEP.  Thank you! Aleshka Corney

## 2021-09-13 ENCOUNTER — Encounter: Payer: Self-pay | Admitting: Cardiovascular Disease

## 2021-09-14 NOTE — Telephone Encounter (Signed)
   Name: [name]  DOB: [birth year]  MRN: [id number]   Primary Cardiologist: Shelva Majestic, MD  Chart reviewed as part of pre-operative protocol coverage.  KRAVEN CALK was last seen on 08/26/21 by Dr. Claiborne Billings.  Dr. Claiborne Billings cleared him for carpal tunnel surgery at that visit. He may hold eliquis for 2 days.   Therefore, based on ACC/AHA guidelines, the patient would be at acceptable risk for the planned procedure without further cardiovascular testing.   The patient was advised that if he develops new symptoms prior to surgery to contact our office to arrange for a follow-up visit, and he verbalized understanding.  I will route this recommendation to the requesting party via Epic fax function and remove from pre-op pool. Please call with questions.  Tami Lin Cina Klumpp, PA 09/14/2021, 2:15 PM

## 2021-09-14 NOTE — Telephone Encounter (Signed)
I s/w the pt and he is aware per Dr. Claiborne Billings he is cleared for surgery and ok to hold Eliquis x 2 days, resume once surgeon feels it is safe. Pt thanked me for the call today and the help. I assured the pt that I will fax new notes from today to surgeon's office.

## 2021-09-23 IMAGING — DX DG CHEST 2V
2 series · 2 of 2 positions shown · non-contrast
Comparison: None.

CLINICAL DATA: Shortness of breath and cough.

EXAM:
CHEST - 2 VIEW

[chest pa]
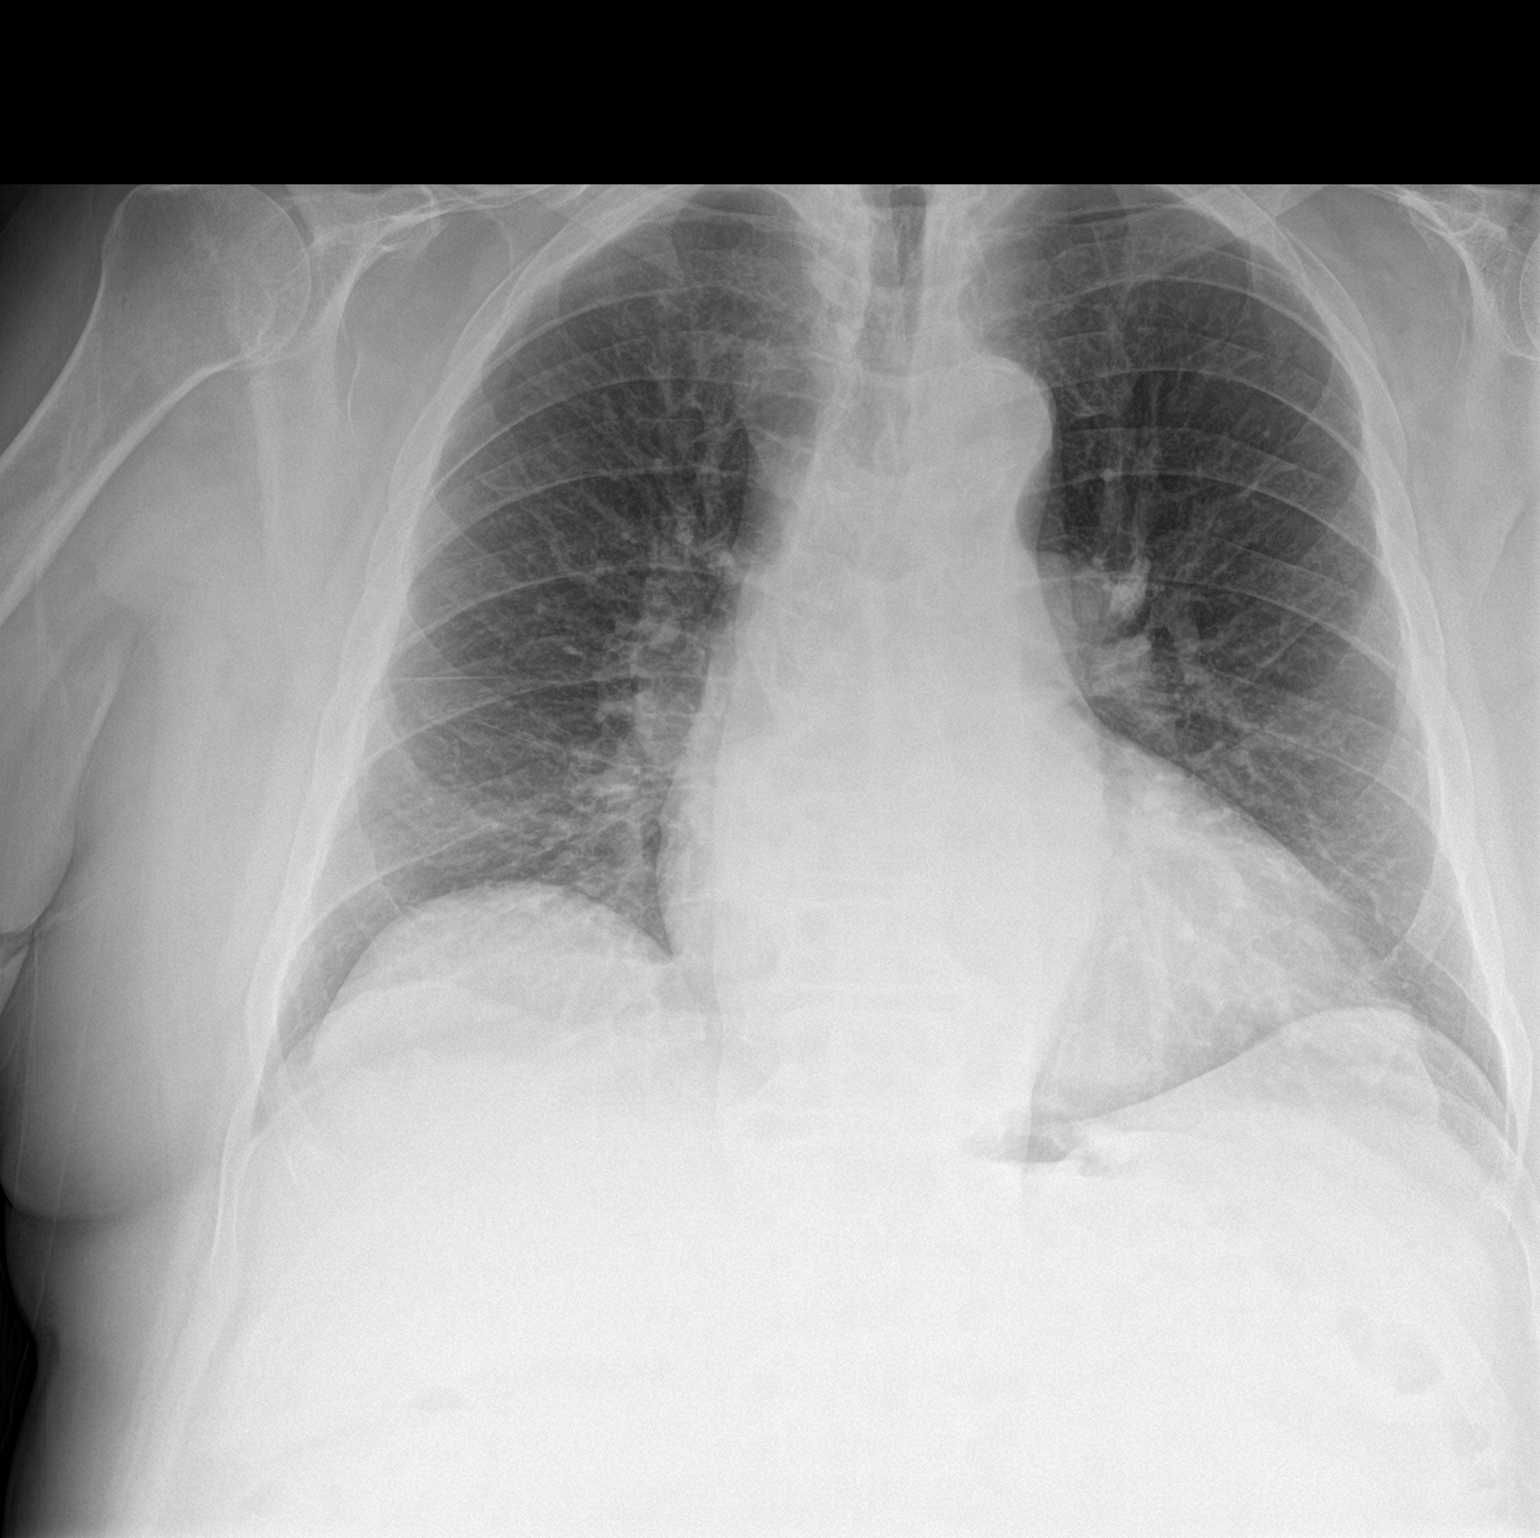

[chest lat]
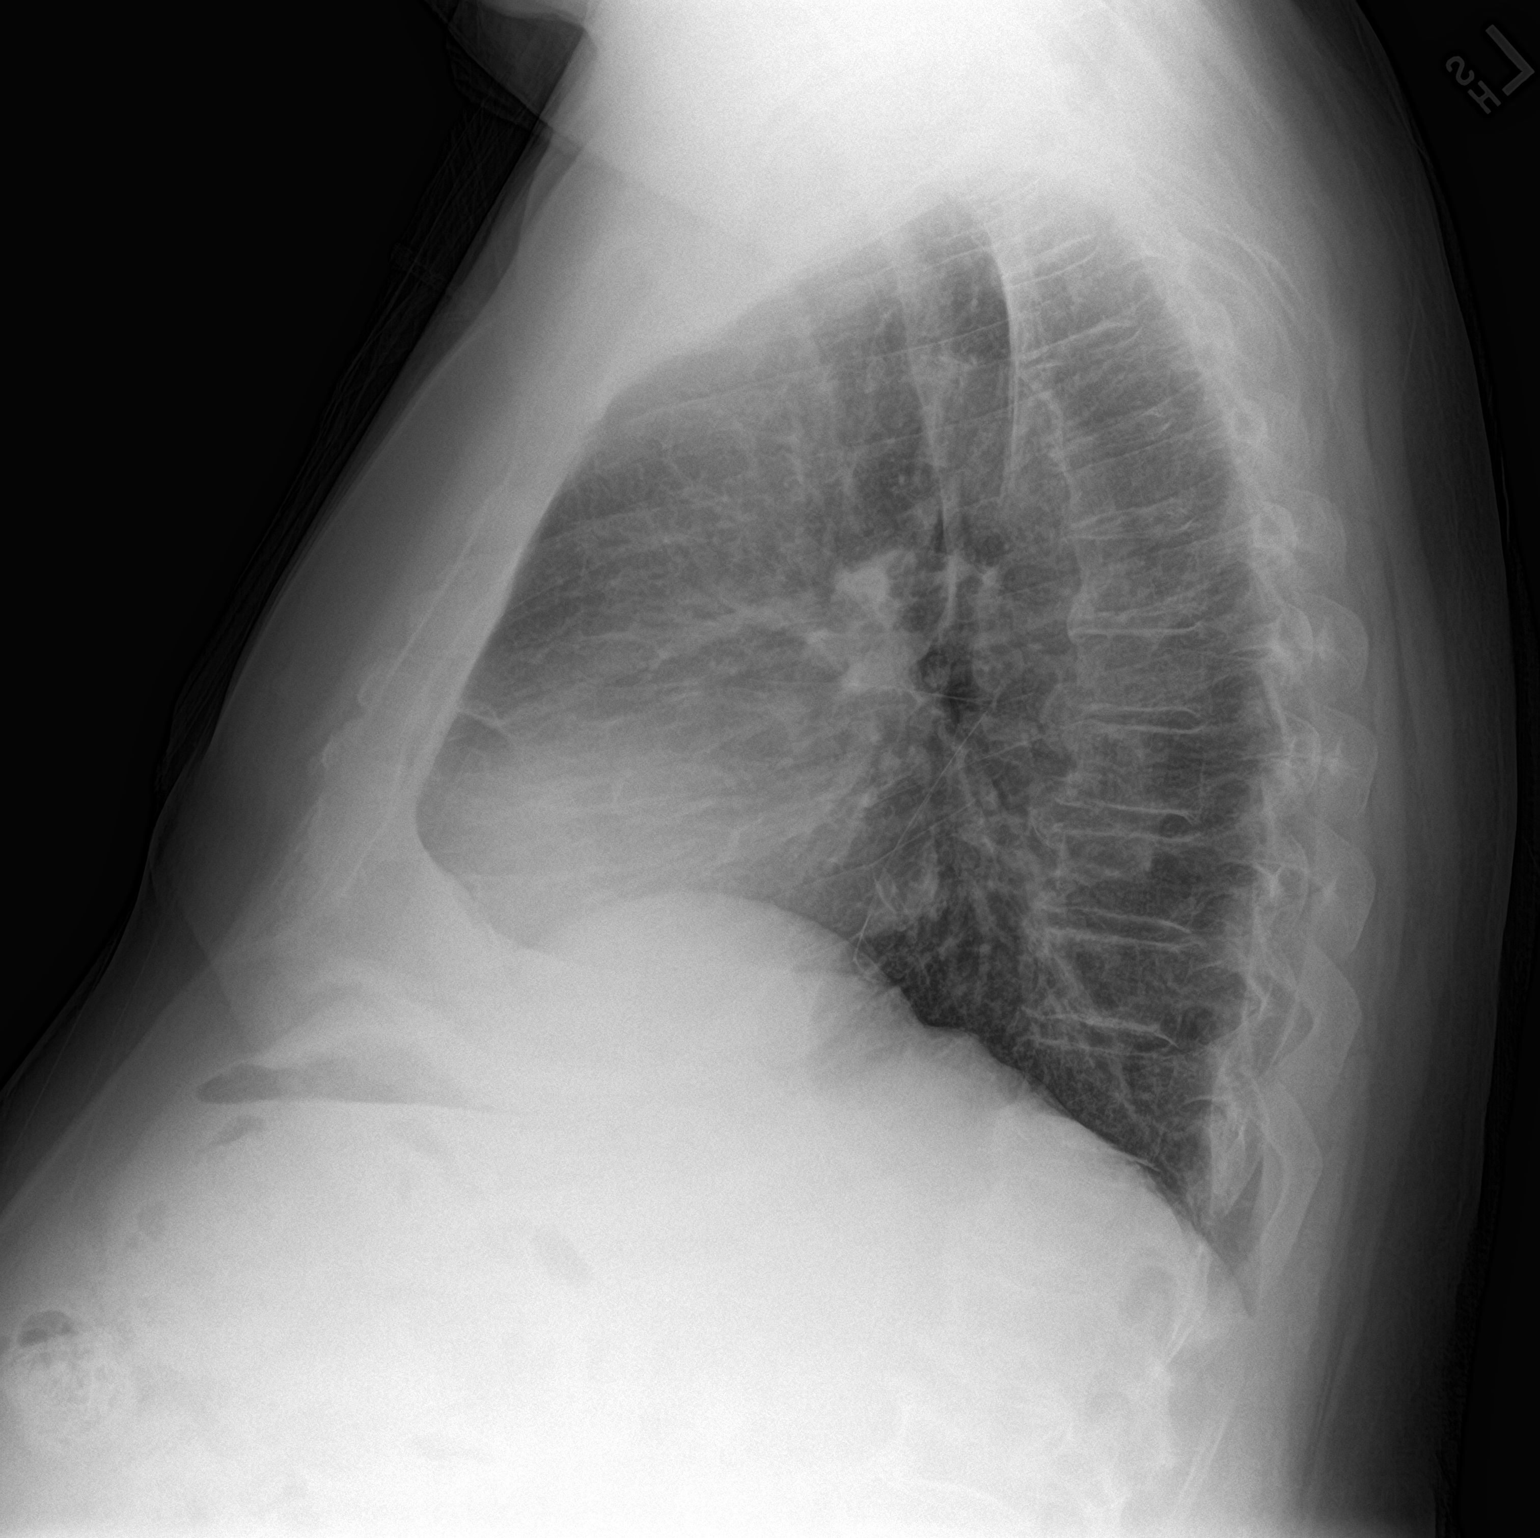

[2 of 2 positions shown; findings below may reference images not displayed]

FINDINGS: There is no evidence of acute infiltrate, pleural effusion or
pneumothorax. The heart size and mediastinal contours are within
normal limits. Multilevel degenerative changes are seen within the
mid and lower thoracic spine.
IMPRESSION: No active cardiopulmonary disease.

## 2021-10-01 DIAGNOSIS — G5601 Carpal tunnel syndrome, right upper limb: Secondary | ICD-10-CM | POA: Diagnosis not present

## 2021-10-05 ENCOUNTER — Telehealth: Payer: Self-pay | Admitting: Cardiovascular Disease

## 2021-10-05 NOTE — Telephone Encounter (Signed)
Medication samples have been provided to the patient.  Drug name: Eliquis 5mg  Qty: 2 LOT: [id number]  Exp.Date: 11/24  Samples left at front desk for patient pick-up. Patient notified.

## 2021-10-05 NOTE — Telephone Encounter (Signed)
Patient calling the office for samples of medication:   1.  What medication and dosage are you requesting samples for? apixaban (ELIQUIS) 5 MG TABS tablet  2.  Are you currently out of this medication? PT HAS ENOUGH ON HAND FOR 4 DAYS, HE WANTS TO KNOW IF HE CAN GET SAMPLES TO LAST HIM TIL 10/2021

## 2021-10-05 NOTE — Telephone Encounter (Signed)
Ok to give samples of Eliquis 5mg  BID until Jan

## 2021-10-14 DIAGNOSIS — G5603 Carpal tunnel syndrome, bilateral upper limbs: Secondary | ICD-10-CM | POA: Diagnosis not present

## 2021-10-21 ENCOUNTER — Ambulatory Visit: Payer: PPO | Admitting: Physician Assistant

## 2021-10-26 ENCOUNTER — Other Ambulatory Visit: Payer: Self-pay | Admitting: Adult Health

## 2021-11-05 DIAGNOSIS — L821 Other seborrheic keratosis: Secondary | ICD-10-CM | POA: Diagnosis not present

## 2021-11-05 DIAGNOSIS — D2271 Melanocytic nevi of right lower limb, including hip: Secondary | ICD-10-CM | POA: Diagnosis not present

## 2021-11-05 DIAGNOSIS — D2272 Melanocytic nevi of left lower limb, including hip: Secondary | ICD-10-CM | POA: Diagnosis not present

## 2021-11-05 DIAGNOSIS — L570 Actinic keratosis: Secondary | ICD-10-CM | POA: Diagnosis not present

## 2021-11-05 DIAGNOSIS — D1801 Hemangioma of skin and subcutaneous tissue: Secondary | ICD-10-CM | POA: Diagnosis not present

## 2021-11-05 DIAGNOSIS — D2262 Melanocytic nevi of left upper limb, including shoulder: Secondary | ICD-10-CM | POA: Diagnosis not present

## 2021-11-05 DIAGNOSIS — D485 Neoplasm of uncertain behavior of skin: Secondary | ICD-10-CM | POA: Diagnosis not present

## 2021-11-05 DIAGNOSIS — L814 Other melanin hyperpigmentation: Secondary | ICD-10-CM | POA: Diagnosis not present

## 2021-11-05 DIAGNOSIS — D2261 Melanocytic nevi of right upper limb, including shoulder: Secondary | ICD-10-CM | POA: Diagnosis not present

## 2021-11-05 DIAGNOSIS — D225 Melanocytic nevi of trunk: Secondary | ICD-10-CM | POA: Diagnosis not present

## 2021-12-08 ENCOUNTER — Other Ambulatory Visit: Payer: Self-pay | Admitting: Adult Health

## 2021-12-08 DIAGNOSIS — E785 Hyperlipidemia, unspecified: Secondary | ICD-10-CM

## 2022-01-04 ENCOUNTER — Other Ambulatory Visit: Payer: Self-pay

## 2022-01-04 ENCOUNTER — Ambulatory Visit: Payer: PPO | Admitting: Cardiovascular Disease

## 2022-01-04 ENCOUNTER — Encounter: Payer: Self-pay | Admitting: Cardiovascular Disease

## 2022-01-04 VITALS — BP 157/77 | HR 66 | Wt 194.0 lb

## 2022-01-04 DIAGNOSIS — E785 Hyperlipidemia, unspecified: Secondary | ICD-10-CM

## 2022-01-04 DIAGNOSIS — E039 Hypothyroidism, unspecified: Secondary | ICD-10-CM | POA: Diagnosis not present

## 2022-01-04 DIAGNOSIS — I4811 Longstanding persistent atrial fibrillation: Secondary | ICD-10-CM | POA: Diagnosis not present

## 2022-01-04 DIAGNOSIS — Z7901 Long term (current) use of anticoagulants: Secondary | ICD-10-CM

## 2022-01-04 DIAGNOSIS — I10 Essential (primary) hypertension: Secondary | ICD-10-CM | POA: Diagnosis not present

## 2022-01-04 NOTE — Progress Notes (Signed)
? ?Cardiology Office Note   ? ?Date:  01/05/2022  ? ?ID:  Glenn Ortega, DOB [birth year], MRN [id number] ? ?PCP:  [name], NP  ?Cardiologist:  Shelva Majestic, MD  ? ?4 month cardiology evaluation  ? ? ?History of Present Illness:  ?Glenn Ortega is a 88 y.o. male who I saw for initial cardiology evaluation for preoperative clearance prior to undergoing carpal tunnel surgery on August 26, 2021.  He presents for a 33-monthfollow-up evaluation. ? ?Mr. Glenn Rushinghas a history of hypertension, hypothyroidism, and hyperlipidemia.  He developed atrial fibrillation in September 2021.  An echo Doppler study showed an EF of 55 to 60% without regional wall motion abnormalities.  There was no significant valvular abnormality.  He was started on Eliquis for anticoagulation.  He was seen by Dr. VIrish Lackon July 24, 2020 and his ECG from June 07, 2020 showed atrial fibrillation with controlled ventricular response.  He was on a regimen of atorvastatin 10 mg, losartan 25 mg, and levothyroxine 50 mcg in addition to his Eliquis 5 mg twice daily. ? ?In September 2022, [place] he had an episode of urinary incontinence and fell backwards onto the bathroom floor.  Rather than being seen in the local ER [place]  CT of his head was unremarkable.  He also was evaluated by neurology.  An EEG was normal.  He [facility] to bilateral hand numbness, tingling and pain.  He was ultimately found to have bilateral median neuropathy at or distal to the wrist consistent with a clinical diagnosis of carpal tunnel syndrome.  He also had left ulnar neuropathy.  As result, he would need to undergo carpal tunnel surgery. ? ?I previously cared for his wife who passed today in 2021.  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   He typically works 40 hours/week.  He denies any chest pain or shortness of breath and he remains active.  He admits to bilateral lower extremity edema.  He states that he has lost approximately 25 pounds over the past year after his wife's death.  He denies any presyncope or syncope.  During his initial evaluation, his blood pressure was stable.  His atrial fibrillation was in the 80s.  He had previously been told to hold Eliquis for 7 days prior to surgery but I recommended that he hold Lasix for 2 to possibly 3 days rather than 7 before surgery. ? ?Since I saw him, he is doing well.  He tolerated carpal tunnel surgery without difficulty.  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  He takes Lasix on a as needed basis for leg edema and typically has been taking this 2 times per week.  [facility] is [facility].  He denies chest pain.  He is scheduled to see CRonna Polio NP who will be checking laboratory.  He presents for reevaluation. ? ? ?Past Medical History:  ?Diagnosis Date  ? Atrial fibrillation ([place]   ? Back pain   ? GERD (gastroesophageal reflux disease)   ? Hyperlipidemia   ? Hypertension   ? EKG and chest x ray 8/12 EPIC/states had stress test 2 yrs ago- doesnt remember where- not in EPIC  ? Spinal stenosis   ?  lumbar  ? Thyroid trouble   ? ? ?Past Surgical History:  ?Procedure Laterality Date  ? COLONOSCOPY    ? CYSTOSCOPY  12/30/2011  ? Procedure: CYSTOSCOPY;  Surgeon: Reece Packer, MD;  [facility];  Service: Urology;  Laterality: N/A;  ? EYE SURGERY Bilateral 06/18/2017  ? cataract sx with lens implant  ? HERNIA REPAIR  2013  ? HERNIA REPAIR Right   ? age  36  ? LUMBAR LAMINECTOMY/DECOMPRESSION MICRODISCECTOMY N/A 11/09/2013  ? Procedure: Lumbar Laminectomy/Decompression, Microdiscectomy Lumbar Three-Four, Four-Five, with Coflex;  Surgeon: Faythe Ghee, MD;  Location: [facility];  Service: Neurosurgery;   Laterality: N/A;  Lumbar Laminectomy/Decompression, Microdiscectomy Lumbar Three-Four, Four-Five, with Coflex  ? PAROTIDECTOMY Left 11/17/2015  ? Procedure: LEFT PAROTIDECTOMY;  Surgeon: Izora Gala, MD;  Location: [facility];  Service: ENT;  Laterality: Left;  ? PROSTATE SURGERY  2013  ? SALIVARY GLAND SURGERY    ? TONSILLECTOMY    ? trigger thumb  1980  ? right  ? ? ?Current Medications: ?Outpatient Medications Prior to Visit  ?Medication Sig Dispense Refill  ? apixaban (ELIQUIS) 5 MG TABS tablet Take 1 tablet (5 mg total) by mouth 2 (two) times daily. 180 tablet 1  ? atorvastatin (LIPITOR) 10 MG tablet TAKE ONE TABLET BY MOUTH DAILY 90 tablet 3  ? furosemide (LASIX) 20 MG tablet Take 1 tablet each day for 4 days. Then take 1 tablet every other day. If your swelling is gone, then take a tablet as needed for the swelling. 90 tablet 3  ? levothyroxine (SYNTHROID) 50 MCG tablet Take 1 tablet (50 mcg total) by mouth every morning. 90 tablet 1  ? losartan (COZAAR) 25 MG tablet TAKE ONE TABLET BY MOUTH DAILY 90 tablet 2  ? Multiple Vitamins-Minerals (PRESERVISION AREDS 2 PO) Take 1 capsule by mouth 2 (two) times daily.    ? metoprolol succinate (TOPROL-XL) 25 MG 24 hr tablet Take 0.5 tablets (12.5 mg total) by mouth daily. Take with or immediately following a meal. 45 tablet 3  ? ?No facility-administered medications prior to visit.  ?  ? ?Allergies:   Penicillins, Gabapentin, Lisinopril, and Penicillin g  ? ?Social History  ? ?Socioeconomic History  ? Marital status: Widowed  ?  Spouse name: Glenn Ortega  ? Number of children: Not on file  ? Years of education: Not on file  ? Highest education level: Not on file  ?Occupational History  ? Occupation: Retired  ?Tobacco Use  ? Smoking status: Never  ? Smokeless tobacco: Never  ? Tobacco comments:  ?  per patient he never smoke. 11/25/2014 /rc  ?Vaping Use  ? Vaping Use: Never used  ?Substance and Sexual Activity  ? Alcohol use: Yes  ?  Alcohol/week: 2.0 standard  drinks  ?  Types: 2 Standard drinks or equivalent per week  ?  Comment: socially- occ beer  ? Drug use: No  ? Sexual activity: Not Currently  ?Other Topics Concern  ? Not on file  ?Social History Narrative  ? Works 3 nights a week at [employer].   ? Previously worked as a Freight forwarder for [employer].  ? 2 caffeinated drinks per day. Does not exercise he says secondary to back problems.  Lives in a one story home.    ? Has 2 children.  Education: some college. He is an is in [employer]  ? Moved from [place]  ? Wife passed from cancer in 2021.   ? ?Social Determinants of  Health  ? ?Financial Resource Strain: Low Risk   ? Difficulty of Paying Living Expenses: Not hard at all  ?Food Insecurity: No Food Insecurity  ? Worried About Charity fundraiser in the Last Year: Never true  ? Ran Out of Food in the Last Year: Never true  ?Transportation Needs: No Transportation Needs  ? Lack of Transportation (Medical): No  ? Lack of Transportation (Non-Medical): No  ?Physical Activity: Insufficiently Active  ? Days of Exercise per Week: 3 days  ? Minutes of Exercise per Session: 30 min  ?Stress: No Stress Concern Present  ? Feeling of Stress : Not at all  ?Social Connections: Moderately Integrated  ? Frequency of Communication with Friends and Family: More than three times a week  ? Frequency of Social Gatherings with Friends and Family: More than three times a week  ? Attends Religious Services: 1 to 4 times per year  ? Active Member of Clubs or Organizations: No  ? Attends Archivist Meetings: 1 to 4 times per year  ? Marital Status: Widowed  ?  ?Additional social history is notable that he was born in [birth year] and for walk away [place].  He works driving truck with parts for [employer] and typically works from 7 until 3:30 PM daily. ? ?Family History:  The patient's family history includes Cancer in his mother; Heart disease in his brother; Hypertension in an other family member; Myasthenia gravis in his  father; Obesity in his father.  ? ?ROS ?General: Negative; No fevers, chills, or night sweats;  ?HEENT: Negative; No changes in vision or hearing, sinus congestion, difficulty swallowing ?Pulmonary: Negative; No c

## 2022-01-04 NOTE — Patient Instructions (Signed)
Medication Instructions:  ?The current medical regimen is effective;  continue present plan and medications as directed. Please refer to the Current Medication list given to you today.  ? ?*If you need a refill on your cardiac medications before your next appointment, please call your pharmacy* ? ?Lab Work:   Testing/Procedures:  ?NONE    NONE ? ?Follow-Up: ?Your next appointment:  12 month(s) In Person with Thomas Kelly, MD    ? ?Please call our office 2 months in advance to schedule this appointment  ? ?At [facility], you and your health needs are our priority.  As part of our continuing mission to provide you with exceptional heart care, we have created designated Provider Care Teams.  These Care Teams include your primary Cardiologist (physician) and Advanced Practice Providers (APPs -  Physician Assistants and Nurse Practitioners) who all work together to provide you with the care you need, when you need it. ? ? ?

## 2022-01-05 ENCOUNTER — Encounter: Payer: Self-pay | Admitting: Cardiovascular Disease

## 2022-02-09 ENCOUNTER — Ambulatory Visit (INDEPENDENT_AMBULATORY_CARE_PROVIDER_SITE_OTHER): Payer: PPO | Admitting: Adult Health

## 2022-02-09 ENCOUNTER — Encounter: Payer: Self-pay | Admitting: Adult Health

## 2022-02-09 VITALS — BP 122/60 | HR 75 | Temp 97.6°F | Ht 66.0 in | Wt 196.0 lb

## 2022-02-09 DIAGNOSIS — L0291 Cutaneous abscess, unspecified: Secondary | ICD-10-CM

## 2022-02-09 MED ORDER — DOXYCYCLINE HYCLATE 100 MG PO CAPS: 100.0000 mg | ORAL_CAPSULE | Freq: Two times a day (BID) | ORAL | 0 refills | Status: DC

## 2022-02-09 NOTE — Progress Notes (Signed)
? ?Subjective:  ? ? Patient ID: Glenn Ortega, male    DOB: 1934/07/17, 88 y.o.   MRN: [id number] ? ?HPI ? ?88 year old male who  has a past medical history of Atrial fibrillation ([place]), Back pain, GERD (gastroesophageal reflux disease), Hyperlipidemia, Hypertension, Spinal stenosis, and Thyroid trouble. ? ?He presents to the office today for an acute issue.  He reports that over the last couple weeks he has developed to "bumps" on his back, one bump at his tailbone and the other on his lower right shoulder blade.  These bumps have been painful, red, warm, and itching.  The 1 on his shoulder blade has improved.  He has not noticed any drainage ? ?Has been applying Neosporin ? ? ?Review of Systems ?See HPI  ?Past Medical History:  ?Diagnosis Date  ? Atrial fibrillation ([place])   ? Back pain   ? GERD (gastroesophageal reflux disease)   ? Hyperlipidemia   ? Hypertension   ? EKG and chest x ray 8/12 EPIC/states had stress test 2 yrs ago- doesnt remember where- not in EPIC  ? Spinal stenosis   ? lumbar  ? Thyroid trouble   ? ? ?Social History  ? ?Socioeconomic History  ? Marital status: Widowed  ?  Spouse name: [name]  ? Number of children: Not on file  ? Years of education: Not on file  ? Highest education level: Not on file  ?Occupational History  ? Occupation: Retired  ?Tobacco Use  ? Smoking status: Never  ? Smokeless tobacco: Never  ? Tobacco comments:  ?  per patient he never smoke. 11/25/2014 /rc  ?Vaping Use  ? Vaping Use: Never used  ?Substance and Sexual Activity  ? Alcohol use: Yes  ?  Alcohol/week: 2.0 standard drinks  ?  Types: 2 Standard drinks or equivalent per week  ?  Comment: socially- occ beer  ? Drug use: No  ? Sexual activity: Not Currently  ?Other Topics Concern  ? Not on file  ?Social History Narrative  ? Works 3 nights a week at [employer].   ? Previously worked as a Freight forwarder for [employer].  ? 2 caffeinated drinks per day. Does not exercise he says secondary to back problems.  Lives in a  one story home.    ? Has 2 children.  Education: some college. He is an is in [employer]  ? Moved from [place]  ? Wife passed from cancer in 2021.   ? ?Social Determinants of Health  ? ?Financial Resource Strain: Low Risk   ? Difficulty of Paying Living Expenses: Not hard at all  ?Food Insecurity: No Food Insecurity  ? Worried About Charity fundraiser in the Last Year: Never true  ? Ran Out of Food in the Last Year: Never true  ?Transportation Needs: No Transportation Needs  ? Lack of Transportation (Medical): No  ? Lack of Transportation (Non-Medical): No  ?Physical Activity: Insufficiently Active  ? Days of Exercise per Week: 3 days  ? Minutes of Exercise per Session: 30 min  ?Stress: No Stress Concern Present  ? Feeling of Stress : Not at all  ?Social Connections: Moderately Integrated  ? Frequency of Communication with Friends and Family: More than three times a week  ? Frequency of Social Gatherings with Friends and Family: More than three times a week  ? Attends Religious Services: 1 to 4 times per year  ? Active Member of Clubs or Organizations: No  ? Attends Archivist  Meetings: 1 to 4 times per year  ? Marital Status: Widowed  ?Intimate Partner Violence: Not At Risk  ? Fear of Current or Ex-Partner: No  ? Emotionally Abused: No  ? Physically Abused: No  ? Sexually Abused: No  ? ? ?Past Surgical History:  ?Procedure Laterality Date  ? COLONOSCOPY    ? CYSTOSCOPY  12/30/2011  ? Procedure: CYSTOSCOPY;  Surgeon: Reece Packer, MD;  [facility];  Service: Urology;  Laterality: N/A;  ? EYE SURGERY Bilateral 06/18/2017  ? cataract sx with lens implant  ? HERNIA REPAIR  2013  ? HERNIA REPAIR Right   ? age  88  ? LUMBAR LAMINECTOMY/DECOMPRESSION MICRODISCECTOMY N/A 11/09/2013  ? Procedure: Lumbar Laminectomy/Decompression, Microdiscectomy Lumbar Three-Four, Four-Five, with Coflex;  Surgeon: Faythe Ghee, MD;  Location: [facility];  Service: Neurosurgery;  Laterality: N/A;  Lumbar  Laminectomy/Decompression, Microdiscectomy Lumbar Three-Four, Four-Five, with Coflex  ? PAROTIDECTOMY Left 11/17/2015  ? Procedure: LEFT PAROTIDECTOMY;  Surgeon: Izora Gala, MD;  Location: [facility];  Service: ENT;  Laterality: Left;  ? PROSTATE SURGERY  2013  ? SALIVARY GLAND SURGERY    ? TONSILLECTOMY    ? trigger thumb  1980  ? right  ? ? ?Family History  ?Problem Relation Age of Onset  ? Hypertension Other   ? Cancer Mother   ?     bone?  ? Myasthenia gravis Father   ? Obesity Father   ? Heart disease Brother   ? ? ?Allergies  ?Allergen Reactions  ? Penicillins Swelling  ?  CHILDHOOD ALLERGY ?Has patient had a PCN reaction causing immediate rash, facial/tongue/throat swelling, SOB or lightheadedness with hypotension: Yes ?Has patient had a PCN reaction causing severe rash involving mucus membranes or skin necrosis: No ?Has patient had a PCN reaction that required hospitalization: No ?Has patient had a PCN reaction occurring within the last 10 years: No ?If all of the above answers are "NO", then may proceed with Cephalosporin use. ?  ? Gabapentin Other (See Comments)  ?  "MADE ME FEEL CRAZY"  ? Lisinopril Cough  ? Penicillin G Hives  ? ? ?Current Outpatient Medications on File Prior to Visit  ?Medication Sig Dispense Refill  ? apixaban (ELIQUIS) 5 MG TABS tablet Take 1 tablet (5 mg total) by mouth 2 (two) times daily. 180 tablet 1  ? atorvastatin (LIPITOR) 10 MG tablet TAKE ONE TABLET BY MOUTH DAILY 90 tablet 3  ? furosemide (LASIX) 20 MG tablet Take 1 tablet each day for 4 days. Then take 1 tablet every other day. If your swelling is gone, then take a tablet as needed for the swelling. 90 tablet 3  ? levothyroxine (SYNTHROID) 50 MCG tablet Take 1 tablet (50 mcg total) by mouth every morning. 90 tablet 1  ? losartan (COZAAR) 25 MG tablet TAKE ONE TABLET BY MOUTH DAILY 90 tablet 2  ? Multiple Vitamins-Minerals (PRESERVISION AREDS 2 PO) Take 1 capsule by mouth 2 (two) times daily.    ? metoprolol  succinate (TOPROL-XL) 25 MG 24 hr tablet Take 0.5 tablets (12.5 mg total) by mouth daily. Take with or immediately following a meal. 45 tablet 3  ? ?No current facility-administered medications on file prior to visit.  ? ? ?BP 122/60   Pulse 75   Temp 97.6 ?F (36.4 ?C) (Oral)   Ht '5\' 6"'$  (1.676 m)   Wt 196 lb (88.9 kg)   SpO2 97%   BMI 31.64 kg/m?  ? ? ?   ?  Objective:  ? Physical Exam ?Vitals and nursing note reviewed.  ?Constitutional:   ?   Appearance: Normal appearance.  ?Skin: ?   Capillary Refill: Capillary refill takes less than 2 seconds.  ? ?    ?Neurological:  ?   General: No focal deficit present.  ?   Mental Status: He is oriented to person, place, and time.  ?Psychiatric:     ?   Mood and Affect: Mood normal.     ?   Behavior: Behavior normal.     ?   Thought Content: Thought content normal.  ? ?   ?Assessment & Plan:  ?1. Abscess ? ?- doxycycline (VIBRAMYCIN) 100 MG capsule; Take 1 capsule (100 mg total) by mouth 2 (two) times daily.  Dispense: 14 capsule; Refill: 0 ? ? ?[name], NP ? ? ?

## 2022-02-10 DIAGNOSIS — G5602 Carpal tunnel syndrome, left upper limb: Secondary | ICD-10-CM | POA: Diagnosis not present

## 2022-02-22 ENCOUNTER — Ambulatory Visit (INDEPENDENT_AMBULATORY_CARE_PROVIDER_SITE_OTHER): Payer: PPO

## 2022-02-22 VITALS — Ht 68.0 in | Wt 192.0 lb

## 2022-02-22 DIAGNOSIS — Z0000 Encounter for general adult medical examination without abnormal findings: Secondary | ICD-10-CM

## 2022-02-22 NOTE — Progress Notes (Signed)
?I connected with Glenn Ortega today by telephone and verified that I am speaking with the correct person using two identifiers. ?Location patient: home ?Location provider: work ?Persons participating in the virtual visit: Vernis, Eid LPN. ?  ?I discussed the limitations, risks, security and privacy concerns of performing an evaluation and management service by telephone and the availability of in person appointments. I also discussed with the patient that there may be a patient responsible charge related to this service. The patient expressed understanding and verbally consented to this telephonic visit.  ?  ?Interactive audio and video telecommunications were attempted between this provider and patient, however failed, due to patient having technical difficulties OR patient did not have access to video capability.  We continued and completed visit with audio only. ? ?  ? ?Vital signs may be patient reported or missing. ? ?Subjective:  ? Glenn Ortega is a 88 y.o. male who presents for Medicare Annual/Subsequent preventive examination. ? ?Review of Systems    ? ?Cardiac Risk Factors include: advanced age (>70mn, >>44women);dyslipidemia;hypertension;male gender ? ?   ?Objective:  ?  ?Today's Vitals  ? 02/22/22 1601  ?Weight: 192 lb (87.1 kg)  ?Height: '5\' 8"'$  (1.727 m)  ? ?Body mass index is 29.19 kg/m?. ? ? ?  02/22/2022  ?  4:07 PM 06/28/2021  ?  4:50 AM 06/27/2021  ? 10:10 PM 03/22/2021  ? 11:00 PM 03/22/2021  ?  1:13 PM 02/17/2021  ?  3:35 PM 12/27/2020  ?  9:05 AM  ?Advanced Directives  ?Does Patient Have a Medical Advance Directive? No  No No No Yes No  ?Type of AVisual merchandiserof ADixonLiving will   ?Does patient want to make changes to medical advance directive?      No - Patient declined   ?Copy of [place]?      No - copy requested   ?Would patient like information on creating a medical advance directive?  No - Patient declined  No - Patient  declined No - Patient declined  No - Patient declined  ? ? ?Current Medications (verified) ?Outpatient Encounter Medications as of 02/22/2022  ?Medication Sig  ? apixaban (ELIQUIS) 5 MG TABS tablet Take 1 tablet (5 mg total) by mouth 2 (two) times daily.  ? atorvastatin (LIPITOR) 10 MG tablet TAKE ONE TABLET BY MOUTH DAILY  ? furosemide (LASIX) 20 MG tablet Take 1 tablet each day for 4 days. Then take 1 tablet every other day. If your swelling is gone, then take a tablet as needed for the swelling.  ? levothyroxine (SYNTHROID) 50 MCG tablet Take 1 tablet (50 mcg total) by mouth every morning.  ? losartan (COZAAR) 25 MG tablet TAKE ONE TABLET BY MOUTH DAILY  ? Multiple Vitamins-Minerals (PRESERVISION AREDS 2 PO) Take 1 capsule by mouth 2 (two) times daily.  ? doxycycline (VIBRAMYCIN) 100 MG capsule Take 1 capsule (100 mg total) by mouth 2 (two) times daily. (Patient not taking: Reported on 02/22/2022)  ? metoprolol succinate (TOPROL-XL) 25 MG 24 hr tablet Take 0.5 tablets (12.5 mg total) by mouth daily. Take with or immediately following a meal.  ? ?No facility-administered encounter medications on file as of 02/22/2022.  ? ? ?Allergies (verified) ?Penicillins, Gabapentin, Lisinopril, and Penicillin g  ? ?History: ?Past Medical History:  ?Diagnosis Date  ? Atrial fibrillation ([place]   ? Back pain   ? GERD (gastroesophageal reflux disease)   ?  Hyperlipidemia   ? Hypertension   ? EKG and chest x ray 8/12 EPIC/states had stress test 2 yrs ago- doesnt remember where- not in EPIC  ? Spinal stenosis   ? lumbar  ? Thyroid trouble   ? ?Past Surgical History:  ?Procedure Laterality Date  ? CARPAL TUNNEL RELEASE Right   ? 09/2021  ? COLONOSCOPY    ? CYSTOSCOPY  12/30/2011  ? Procedure: CYSTOSCOPY;  Surgeon: Reece Packer, MD;  [facility];  Service: Urology;  Laterality: N/A;  ? EYE SURGERY Bilateral 06/18/2017  ? cataract sx with lens implant  ? HERNIA REPAIR  2013  ? HERNIA REPAIR Right   ? age  41  ? LUMBAR  LAMINECTOMY/DECOMPRESSION MICRODISCECTOMY N/A 11/09/2013  ? Procedure: Lumbar Laminectomy/Decompression, Microdiscectomy Lumbar Three-Four, Four-Five, with Coflex;  Surgeon: Faythe Ghee, MD;  Location: [facility];  Service: Neurosurgery;  Laterality: N/A;  Lumbar Laminectomy/Decompression, Microdiscectomy Lumbar Three-Four, Four-Five, with Coflex  ? PAROTIDECTOMY Left 11/17/2015  ? Procedure: LEFT PAROTIDECTOMY;  Surgeon: Izora Gala, MD;  Location: [facility];  Service: ENT;  Laterality: Left;  ? PROSTATE SURGERY  2013  ? SALIVARY GLAND SURGERY    ? TONSILLECTOMY    ? trigger thumb  1980  ? right  ? ?Family History  ?Problem Relation Age of Onset  ? Hypertension Other   ? Cancer Mother   ?     bone?  ? Myasthenia gravis Father   ? Obesity Father   ? Heart disease Brother   ? ?Social History  ? ?Socioeconomic History  ? Marital status: Widowed  ?  Spouse name: [name]  ? Number of children: Not on file  ? Years of education: Not on file  ? Highest education level: Not on file  ?Occupational History  ? Occupation: Retired  ?Tobacco Use  ? Smoking status: Never  ? Smokeless tobacco: Never  ? Tobacco comments:  ?  per patient he never smoke. 11/25/2014 /rc  ?Vaping Use  ? Vaping Use: Never used  ?Substance and Sexual Activity  ? Alcohol use: Yes  ?  Alcohol/week: 2.0 standard drinks  ?  Types: 2 Standard drinks or equivalent per week  ?  Comment: socially- occ beer  ? Drug use: No  ? Sexual activity: Not Currently  ?Other Topics Concern  ? Not on file  ?Social History Narrative  ? Works 3 nights a week at [employer].   ? Previously worked as a Freight forwarder for [employer].  ? 2 caffeinated drinks per day. Does not exercise he says secondary to back problems.  Lives in a one story home.    ? Has 2 children.  Education: some college. He is an is in [employer]  ? Moved from [place]  ? Wife passed from cancer in 2021.   ? ?Social Determinants of Health  ? ?Financial Resource Strain: Low Risk   ?  Difficulty of Paying Living Expenses: Not hard at all  ?Food Insecurity: No Food Insecurity  ? Worried About Charity fundraiser in the Last Year: Never true  ? Ran Out of Food in the Last Year: Never true  ?Transportation Needs: No Transportation Needs  ? Lack of Transportation (Medical): No  ? Lack of Transportation (Non-Medical): No  ?Physical Activity: Sufficiently Active  ? Days of Exercise per Week: 2 days  ? Minutes of Exercise per Session: 90 min  ?Stress: No Stress Concern Present  ? Feeling of Stress : Not at all  ?  Social Connections: Not on file  ? ? ?Tobacco Counseling ?Counseling given: Not Answered ?Tobacco comments: per patient he never smoke. 11/25/2014 /rc ? ? ?Clinical Intake: ? ?Pre-visit preparation completed: Yes ? ?Pain : No/denies pain ? ?  ? ?Nutritional Status: BMI 25 -29 Overweight ?Nutritional Risks: None ?Diabetes: No ? ?How often do you need to have someone help you when you read instructions, pamphlets, or other written materials from your doctor or pharmacy?: 1 - Never ? ?Diabetic? no ? ?Interpreter Needed?: No ? ?Information entered by :: NAllen LPN ? ? ?Activities of Daily Living ? ?  02/22/2022  ?  4:08 PM 06/29/2021  ?  4:16 PM  ?In your present state of health, do you have any difficulty performing the following activities:  ?Hearing? 0   ?Comment has hearing aids   ?Vision? 0   ?Difficulty concentrating or making decisions? 0   ?Walking or climbing stairs? 0   ?Dressing or bathing? 0   ?Doing errands, shopping? 0 0  ?Preparing Food and eating ? N   ?Using the Toilet? N   ?In the past six months, have you accidently leaked urine? N   ?Do you have problems with loss of bowel control? N   ?Managing your Medications? N   ?Managing your Finances? N   ?Housekeeping or managing your Housekeeping? N   ? ? ?Patient Care Team: ?[name], NP as PCP - General (Family Medicine) ?Troy Sine, MD as PCP - Cardiology (Cardiology) ? ?Indicate any recent Medical Services you may have  received from other than [facility] providers in the past year (date may be approximate). ? ?   ?Assessment:  ? This is a routine wellness examination for Jawuan. ? ?Hearing/Vision screen ?Vision Screening - Comments:: Regular eye exams

## 2022-02-22 NOTE — Patient Instructions (Signed)
Glenn Ortega , ?Thank you for taking time to come for your Medicare Wellness Visit. I appreciate your ongoing commitment to your health goals. Please review the following plan we discussed and let me know if I can assist you in the future.  ? ?Screening recommendations/referrals: ?Colonoscopy: not required ?Recommended yearly ophthalmology/optometry visit for glaucoma screening and checkup ?Recommended yearly dental visit for hygiene and checkup ? ?Vaccinations: ?Influenza vaccine: due 05/18/2022 ?Pneumococcal vaccine: completed 10/28/2015 ?Tdap vaccine: completed 04/08/2014, due 04/08/2024 ?Shingles vaccine: completed   ?Covid-19:  08/11/2021, 12/03/2019, 11/11/2019 ? ?Advanced directives: Advance directive discussed with you today.  ? ?Conditions/risks identified: none ? ?Next appointment: Follow up in one year for your annual wellness visit.  ? ?Preventive Care 86 Years and Older, Male ?Preventive care refers to lifestyle choices and visits with your health care provider that can promote health and wellness. ?What does preventive care include? ?A yearly physical exam. This is also called an annual well check. ?Dental exams once or twice a year. ?Routine eye exams. Ask your health care provider how often you should have your eyes checked. ?Personal lifestyle choices, including: ?Daily care of your teeth and gums. ?Regular physical activity. ?Eating a healthy diet. ?Avoiding tobacco and drug use. ?Limiting alcohol use. ?Practicing safe sex. ?Taking low doses of aspirin every day. ?Taking vitamin and mineral supplements as recommended by your health care provider. ?What happens during an annual well check? ?The services and screenings done by your health care provider during your annual well check will depend on your age, overall health, lifestyle risk factors, and family history of disease. ?Counseling  ?Your health care provider may ask you questions about your: ?Alcohol use. ?Tobacco use. ?Drug use. ?Emotional  well-being. ?Home and relationship well-being. ?Sexual activity. ?Eating habits. ?History of falls. ?Memory and ability to understand (cognition). ?Work and work Statistician. ?Screening  ?You may have the following tests or measurements: ?Height, weight, and BMI. ?Blood pressure. ?Lipid and cholesterol levels. These may be checked every 5 years, or more frequently if you are over 36 years old. ?Skin check. ?Lung cancer screening. You may have this screening every year starting at age 26 if you have a 30-pack-year history of smoking and currently smoke or have quit within the past 15 years. ?Fecal occult blood test (FOBT) of the stool. You may have this test every year starting at age 9. ?Flexible sigmoidoscopy or colonoscopy. You may have a sigmoidoscopy every 5 years or a colonoscopy every 10 years starting at age 9. ?Prostate cancer screening. Recommendations will vary depending on your family history and other risks. ?Hepatitis C blood test. ?Hepatitis B blood test. ?Sexually transmitted disease (STD) testing. ?Diabetes screening. This is done by checking your blood sugar (glucose) after you have not eaten for a while (fasting). You may have this done every 1-3 years. ?Abdominal aortic aneurysm (AAA) screening. You may need this if you are a current or former smoker. ?Osteoporosis. You may be screened starting at age 43 if you are at high risk. ?Talk with your health care provider about your test results, treatment options, and if necessary, the need for more tests. ?Vaccines  ?Your health care provider may recommend certain vaccines, such as: ?Influenza vaccine. This is recommended every year. ?Tetanus, diphtheria, and acellular pertussis (Tdap, Td) vaccine. You may need a Td booster every 10 years. ?Zoster vaccine. You may need this after age 3. ?Pneumococcal 13-valent conjugate (PCV13) vaccine. One dose is recommended after age 46. ?Pneumococcal polysaccharide (PPSV23) vaccine. One dose is recommended  after  age 70. ?Talk to your health care provider about which screenings and vaccines you need and how often you need them. ?This information is not intended to replace advice given to you by your health care provider. Make sure you discuss any questions you have with your health care provider. ?Document Released: 10/31/2015 Document Revised: 06/23/2016 Document Reviewed: 08/05/2015 ?Elsevier Interactive Patient Education ? 2017 [place]. ? ?Fall Prevention in the Home ?Falls can cause injuries. They can happen to people of all ages. There are many things you can do to make your home safe and to help prevent falls. ?What can I do on the outside of my home? ?Regularly fix the edges of walkways and driveways and fix any cracks. ?Remove anything that might make you trip as you walk through a door, such as a raised step or threshold. ?Trim any bushes or trees on the path to your home. ?Use bright outdoor lighting. ?Clear any walking paths of anything that might make someone trip, such as rocks or tools. ?Regularly check to see if handrails are loose or broken. Make sure that both sides of any steps have handrails. ?Any raised decks and porches should have guardrails on the edges. ?Have any leaves, snow, or ice cleared regularly. ?Use sand or salt on walking paths during winter. ?Clean up any spills in your garage right away. This includes oil or grease spills. ?What can I do in the bathroom? ?Use night lights. ?Install grab bars by the toilet and in the tub and shower. Do not use towel bars as grab bars. ?Use non-skid mats or decals in the tub or shower. ?If you need to sit down in the shower, use a plastic, non-slip stool. ?Keep the floor dry. Clean up any water that spills on the floor as soon as it happens. ?Remove soap buildup in the tub or shower regularly. ?Attach bath mats securely with double-sided non-slip rug tape. ?Do not have throw rugs and other things on the floor that can make you trip. ?What can I do in the  bedroom? ?Use night lights. ?Make sure that you have a light by your bed that is easy to reach. ?Do not use any sheets or blankets that are too big for your bed. They should not hang down onto the floor. ?Have a firm chair that has side arms. You can use this for support while you get dressed. ?Do not have throw rugs and other things on the floor that can make you trip. ?What can I do in the kitchen? ?Clean up any spills right away. ?Avoid walking on wet floors. ?Keep items that you use a lot in easy-to-reach places. ?If you need to reach something above you, use a strong step stool that has a grab bar. ?Keep electrical cords out of the way. ?Do not use floor polish or wax that makes floors slippery. If you must use wax, use non-skid floor wax. ?Do not have throw rugs and other things on the floor that can make you trip. ?What can I do with my stairs? ?Do not leave any items on the stairs. ?Make sure that there are handrails on both sides of the stairs and use them. Fix handrails that are broken or loose. Make sure that handrails are as long as the stairways. ?Check any carpeting to make sure that it is firmly attached to the stairs. Fix any carpet that is loose or worn. ?Avoid having throw rugs at the top or bottom of the stairs. If you  do have throw rugs, attach them to the floor with carpet tape. ?Make sure that you have a light switch at the top of the stairs and the bottom of the stairs. If you do not have them, ask someone to add them for you. ?What else can I do to help prevent falls? ?Wear shoes that: ?Do not have high heels. ?Have rubber bottoms. ?Are comfortable and fit you well. ?Are closed at the toe. Do not wear sandals. ?If you use a stepladder: ?Make sure that it is fully opened. Do not climb a closed stepladder. ?Make sure that both sides of the stepladder are locked into place. ?Ask someone to hold it for you, if possible. ?Clearly mark and make sure that you can see: ?Any grab bars or  handrails. ?First and last steps. ?Where the edge of each step is. ?Use tools that help you move around (mobility aids) if they are needed. These include: ?Canes. ?Walkers. ?Scooters. ?Crutches. ?Turn on the lights when you

## 2022-03-05 ENCOUNTER — Ambulatory Visit (INDEPENDENT_AMBULATORY_CARE_PROVIDER_SITE_OTHER): Payer: PPO | Admitting: Adult Health

## 2022-03-05 ENCOUNTER — Encounter: Payer: Self-pay | Admitting: Adult Health

## 2022-03-05 VITALS — BP 122/80 | HR 76 | Temp 97.6°F | Ht 65.0 in | Wt 196.0 lb

## 2022-03-05 DIAGNOSIS — Z0000 Encounter for general adult medical examination without abnormal findings: Secondary | ICD-10-CM | POA: Diagnosis not present

## 2022-03-05 DIAGNOSIS — E039 Hypothyroidism, unspecified: Secondary | ICD-10-CM | POA: Diagnosis not present

## 2022-03-05 DIAGNOSIS — I4891 Unspecified atrial fibrillation: Secondary | ICD-10-CM

## 2022-03-05 DIAGNOSIS — I10 Essential (primary) hypertension: Secondary | ICD-10-CM | POA: Diagnosis not present

## 2022-03-05 DIAGNOSIS — E782 Mixed hyperlipidemia: Secondary | ICD-10-CM

## 2022-03-05 NOTE — Progress Notes (Signed)
Subjective:    Patient ID: Glenn Ortega, male    DOB: 1934/05/20, 88 y.o.   MRN: [id number]  HPI Patient presents for yearly preventative medicine examination. He is a pleasant 88 year old male who  has a past medical history of Atrial fibrillation ([place]), Back pain, GERD (gastroesophageal reflux disease), Hyperlipidemia, Hypertension, Spinal stenosis, and Thyroid trouble.  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  Currently prescribed Eliquis 5 mg twice daily and metoprolol XL 12.5 mg daily. He denies chest pain or shortness of breath   Hypothyroidism - takes synthroid 50 mcg daily   Lab Results  Component Value Date   TSH 1.61 06/18/2020   Essential Hypertension -managed with Cozaar 25 mg daily.  He denies dizziness, lightheadedness, or blurred vision. BP Readings from Last 3 Encounters:  03/05/22 122/80  02/09/22 122/60  01/04/22 (!) 157/77   Hyperlipidemia-currently prescribed Lipitor 10 mg daily.  He denies myalgia or fatigue  Lab Results  Component Value Date   CHOL 132 06/28/2021   HDL 57 06/28/2021   LDLCALC 65 06/28/2021   TRIG 48 06/28/2021   CHOLHDL 2.3 06/28/2021    All immunizations and health maintenance protocols were reviewed with the patient and needed orders were placed.  Appropriate screening laboratory values were ordered for the patient including screening of hyperlipidemia, renal function and hepatic function. If indicated by BPH, a PSA was ordered.  Medication reconciliation,  past medical history, social history, problem list and allergies were reviewed in detail with the patient  Goals were established with regard to weight loss, exercise, and  diet in compliance with medications. He is going to the gym on a routine basis and is trying to eat healthy   Wt Readings from Last 10 Encounters:  03/05/22 196 lb (88.9 kg)  02/22/22 192 lb (87.1 kg)  02/09/22 196 lb (88.9 kg)  01/04/22 194 lb (88 kg)   08/26/21 192 lb 12.8 oz (87.5 kg)  08/20/21 192 lb (87.1 kg)  06/27/21 200 lb (90.7 kg)  03/18/21 204 lb (92.5 kg)  02/11/21 204 lb (92.5 kg)  12/23/20 204 lb (92.5 kg)    Review of Systems  Constitutional: Negative.   HENT: Negative.    Eyes: Negative.   Respiratory: Negative.    Cardiovascular: Negative.   Gastrointestinal: Negative.   Endocrine: Negative.   Genitourinary: Negative.   Musculoskeletal: Negative.   Skin: Negative.   Allergic/Immunologic: Negative.   Neurological: Negative.   Hematological: Negative.   Psychiatric/Behavioral: Negative.    All other systems reviewed and are negative.  Past Medical History:  Diagnosis Date   Atrial fibrillation ([facility])    Back pain    GERD (gastroesophageal reflux disease)    Hyperlipidemia    Hypertension    EKG and chest x ray 8/12 [facility]/states had stress test 2 yrs ago- doesnt remember where- not in [facility]   Spinal stenosis    lumbar   Thyroid trouble     Social History   Socioeconomic History   Marital status: Widowed    Spouse name: [name]   Number of children: Not on file   Years of education: Not on file   Highest education level: Not on file  Occupational History   Occupation: Retired  Tobacco Use   Smoking status: Never   Smokeless tobacco: Never   Tobacco comments:    per patient he never smoke. 11/25/2014 /rc  Vaping Use   Vaping Use: Never  used  Substance and Sexual Activity   Alcohol use: Yes    Alcohol/week: 2.0 standard drinks    Types: 2 Standard drinks or equivalent per week    Comment: socially- occ beer   Drug use: No   Sexual activity: Not Currently  Other Topics Concern   Not on file  Social History Narrative   Works 3 nights a week at [employer].    Previously worked as a Freight forwarder for [employer].   2 caffeinated drinks per day. Does not exercise he says secondary to back problems.  Lives in a one story home.     Has 2 children.  Education: some college. He is an is in [facility] from [place]   Wife passed from [place] in 2021.    Social Determinants of Health   Financial Resource Strain: Low Risk    Difficulty of Paying Living Expenses: Not hard at all  Food Insecurity: No Food Insecurity   Worried About Charity fundraiser in the Last Year: Never true   [place] in the Last Year: Never true  Transportation Needs: No Transportation Needs   Lack of Transportation (Medical): No   Lack of Transportation (Non-Medical): No  Physical Activity: Sufficiently Active   Days of Exercise per Week: 2 days   Minutes of Exercise per Session: 90 min  Stress: No Stress Concern Present   Feeling of Stress : Not at all  Social Connections: Not on file  Intimate Partner Violence: Not on file    Past Surgical History:  Procedure Laterality Date   CARPAL TUNNEL RELEASE Right    09/2021   COLONOSCOPY     CYSTOSCOPY  12/30/2011   Procedure: CYSTOSCOPY;  Surgeon: Reece Packer, MD;  Location: [facility];  Service: Urology;  Laterality: N/A;   EYE SURGERY Bilateral 06/18/2017   cataract sx with lens implant   HERNIA REPAIR  2013   HERNIA REPAIR Right    age  66   LUMBAR LAMINECTOMY/DECOMPRESSION MICRODISCECTOMY N/A 11/09/2013   Procedure: Lumbar Laminectomy/Decompression, Microdiscectomy Lumbar Three-Four, Four-Five, with Coflex;  Surgeon: Faythe Ghee, MD;  Location: [facility];  Service: Neurosurgery;  Laterality: N/A;  Lumbar Laminectomy/Decompression, Microdiscectomy Lumbar Three-Four, Four-Five, with Coflex   PAROTIDECTOMY Left 11/17/2015   Procedure: LEFT PAROTIDECTOMY;  Surgeon: Izora Gala, MD;  Location: [facility];  Service: ENT;  Laterality: Left;   PROSTATE SURGERY  2013   SALIVARY GLAND SURGERY     TONSILLECTOMY     trigger thumb  1980   right    Family History  Problem Relation Age of Onset   Hypertension Other    Cancer Mother        bone?   Myasthenia gravis Father    Obesity Father    Heart disease Brother      Allergies  Allergen Reactions   Penicillins Swelling    CHILDHOOD ALLERGY Has patient had a PCN reaction causing immediate rash, facial/tongue/throat swelling, SOB or lightheadedness with hypotension: Yes Has patient had a PCN reaction causing severe rash involving mucus membranes or skin necrosis: No Has patient had a PCN reaction that required hospitalization: No Has patient had a PCN reaction occurring within the last 10 years: No If all of the above answers are "NO", then may proceed with Cephalosporin use.    Gabapentin Other (See Comments)    "MADE ME FEEL CRAZY"   Lisinopril Cough   Penicillin G Hives  Current Outpatient Medications on File Prior to Visit  Medication Sig Dispense Refill   apixaban (ELIQUIS) 5 MG TABS tablet Take 1 tablet (5 mg total) by mouth 2 (two) times daily. 180 tablet 1   atorvastatin (LIPITOR) 10 MG tablet TAKE ONE TABLET BY MOUTH DAILY 90 tablet 3   furosemide (LASIX) 20 MG tablet Take 1 tablet each day for 4 days. Then take 1 tablet every other day. If your swelling is gone, then take a tablet as needed for the swelling. 90 tablet 3   levothyroxine (SYNTHROID) 50 MCG tablet Take 1 tablet (50 mcg total) by mouth every morning. 90 tablet 1   losartan (COZAAR) 25 MG tablet TAKE ONE TABLET BY MOUTH DAILY 90 tablet 2   Multiple Vitamins-Minerals (PRESERVISION AREDS 2 PO) Take 1 capsule by mouth 2 (two) times daily.     metoprolol succinate (TOPROL-XL) 25 MG 24 hr tablet Take 0.5 tablets (12.5 mg total) by mouth daily. Take with or immediately following a meal. 45 tablet 3   No current facility-administered medications on file prior to visit.    BP 122/80   Pulse 76   Temp 97.6 F (36.4 C) (Oral)   Ht '5\' 5"'$  (1.651 m)   Wt 196 lb (88.9 kg)   SpO2 98%   BMI 32.62 kg/m       Objective:   Physical Exam Vitals and nursing note reviewed.  Constitutional:      General: He is not in acute distress.    Appearance: Normal appearance. He is  well-developed and overweight.  HENT:     Head: Normocephalic and atraumatic.     Right Ear: Tympanic membrane, ear canal and external ear normal. There is no impacted cerumen.     Left Ear: Tympanic membrane, ear canal and external ear normal. There is no impacted cerumen.     Nose: Nose normal. No congestion or rhinorrhea.     Mouth/Throat:     Mouth: Mucous membranes are moist.     Pharynx: Oropharynx is clear. No oropharyngeal exudate or posterior oropharyngeal erythema.  Eyes:     General:        Right eye: No discharge.        Left eye: No discharge.     Extraocular Movements: Extraocular movements intact.     Conjunctiva/sclera: Conjunctivae normal.     Pupils: Pupils are equal, round, and reactive to light.  Neck:     Vascular: No carotid bruit.     Trachea: No tracheal deviation.  Cardiovascular:     Rate and Rhythm: Normal rate and regular rhythm.     Pulses: Normal pulses.     Heart sounds: Normal heart sounds. No murmur heard.   No friction rub. No gallop.  Pulmonary:     Effort: Pulmonary effort is normal. No respiratory distress.     Breath sounds: Normal breath sounds. No stridor. No wheezing, rhonchi or rales.  Chest:     Chest wall: No tenderness.  Abdominal:     General: Bowel sounds are normal. There is no distension.     Palpations: Abdomen is soft. There is no mass.     Tenderness: There is no abdominal tenderness. There is no right CVA tenderness, left CVA tenderness, guarding or rebound.     Hernia: No hernia is present.  Musculoskeletal:        General: No swelling, tenderness, deformity or signs of injury. Normal range of motion.     Right lower leg: No  edema.     Left lower leg: No edema.  Lymphadenopathy:     Cervical: No cervical adenopathy.  Skin:    General: Skin is warm and dry.     Capillary Refill: Capillary refill takes less than 2 seconds.     Coloration: Skin is not jaundiced or pale.     Findings: No bruising, erythema, lesion or rash.   Neurological:     General: No focal deficit present.     Mental Status: He is alert and oriented to person, place, and time.     Cranial Nerves: No cranial nerve deficit.     Sensory: No sensory deficit.     Motor: No weakness.     Coordination: Coordination normal.     Gait: Gait normal.     Deep Tendon Reflexes: Reflexes normal.  Psychiatric:        Mood and Affect: Mood normal.        Behavior: Behavior normal.        Thought Content: Thought content normal.        Judgment: Judgment normal.      Assessment & Plan:  1. Routine general medical examination at a health care facility - Follow up in one year or sooner if needed - Continue to exercise and eat healthy  - TSH; Future - Lipid panel; Future - Comprehensive metabolic panel; Future - CBC with Differential/Platelet; Future - CBC with Differential/Platelet - Comprehensive metabolic panel - Lipid panel - TSH  2. Essential hypertension - Well controlled. No change in medications  - TSH; Future - Lipid panel; Future - Comprehensive metabolic panel; Future - CBC with Differential/Platelet; Future - CBC with Differential/Platelet - Comprehensive metabolic panel - Lipid panel - TSH  3. Atrial fibrillation, unspecified type ([place]) - SR today. Follow up with Cardiology as directed - TSH; Future - Lipid panel; Future - Comprehensive metabolic panel; Future - CBC with Differential/Platelet; Future - CBC with Differential/Platelet - Comprehensive metabolic panel - Lipid panel - TSH  4. Mixed hyperlipidemia - Consider increase in statin  - TSH; Future - Lipid panel; Future - Comprehensive metabolic panel; Future - CBC with Differential/Platelet; Future - CBC with Differential/Platelet - Comprehensive metabolic panel - Lipid panel - TSH  5. Hypothyroidism, unspecified type - Consider increase in synthroid - TSH; Future - Lipid panel; Future - Comprehensive metabolic panel; Future - CBC with  Differential/Platelet; Future - CBC with Differential/Platelet - Comprehensive metabolic panel - Lipid panel - TSH  [name], NP

## 2022-03-06 LAB — CBC WITH DIFFERENTIAL/PLATELET
Absolute Monocytes: 711 cells/uL (ref 200–950)
Basophils Absolute: 40 cells/uL (ref 0–200)
Basophils Relative: 0.5 %
Eosinophils Absolute: 150 cells/uL (ref 15–500)
Eosinophils Relative: 1.9 %
HCT: 44.9 % (ref 38.5–50.0)
Hemoglobin: 15.4 g/dL (ref 13.2–17.1)
Lymphs Abs: 1912 cells/uL (ref 850–3900)
MCH: 29.2 pg (ref 27.0–33.0)
MCHC: 34.3 g/dL (ref 32.0–36.0)
MCV: 85.2 fL (ref 80.0–100.0)
MPV: 10.1 fL (ref 7.5–12.5)
Monocytes Relative: 9 %
Neutro Abs: 5088 cells/uL (ref 1500–7800)
Neutrophils Relative %: 64.4 %
Platelets: 203 10*3/uL (ref 140–400)
RBC: 5.27 10*6/uL (ref 4.20–5.80)
RDW: 14.1 % (ref 11.0–15.0)
Total Lymphocyte: 24.2 %
WBC: 7.9 10*3/uL (ref 3.8–10.8)

## 2022-03-06 LAB — COMPREHENSIVE METABOLIC PANEL
AG Ratio: 1.6 (calc) (ref 1.0–2.5)
ALT: 10 U/L (ref 9–46)
AST: 15 U/L (ref 10–35)
Albumin: 4.4 g/dL (ref 3.6–5.1)
Alkaline phosphatase (APISO): 45 U/L (ref 35–144)
BUN: 18 mg/dL (ref 7–25)
CO2: 27 mmol/L (ref 20–32)
Calcium: 9.5 mg/dL (ref 8.6–10.3)
Chloride: 104 mmol/L (ref 98–110)
Creat: 1.13 mg/dL (ref 0.70–1.22)
Globulin: 2.8 g/dL (calc) (ref 1.9–3.7)
Glucose, Bld: 78 mg/dL (ref 65–99)
Potassium: 4.3 mmol/L (ref 3.5–5.3)
Sodium: 141 mmol/L (ref 135–146)
Total Bilirubin: 1.2 mg/dL (ref 0.2–1.2)
Total Protein: 7.2 g/dL (ref 6.1–8.1)

## 2022-03-06 LAB — LIPID PANEL
Cholesterol: 176 mg/dL (ref <200)
HDL: 67 mg/dL (ref >=40)
LDL Cholesterol (Calc): 92 mg/dL (calc)
Non-HDL Cholesterol (Calc): 109 mg/dL (calc) (ref <130)
Total CHOL/HDL Ratio: 2.6 (calc) (ref <5.0)
Triglycerides: 76 mg/dL (ref <150)

## 2022-03-06 LAB — TSH: TSH: 4.26 mIU/L (ref 0.40–4.50)

## 2022-03-22 ENCOUNTER — Other Ambulatory Visit: Payer: Self-pay

## 2022-03-22 DIAGNOSIS — I4891 Unspecified atrial fibrillation: Secondary | ICD-10-CM

## 2022-03-22 MED ORDER — APIXABAN 5 MG PO TABS: 5.0000 mg | ORAL_TABLET | Freq: Two times a day (BID) | ORAL | 1 refills | Status: DC

## 2022-03-22 NOTE — Telephone Encounter (Signed)
Prescription refill request for Eliquis received. Indication:Afib  Last office visit: 01/04/22 Claiborne Billings)  Scr: 1.13 (03/05/22)  Age: 88 Weight: 88.9kg  Appropriate dose and refill sent to requested pharmacy.

## 2022-03-23 ENCOUNTER — Other Ambulatory Visit: Payer: Self-pay

## 2022-03-23 DIAGNOSIS — E785 Hyperlipidemia, unspecified: Secondary | ICD-10-CM

## 2022-03-23 MED ORDER — LEVOTHYROXINE SODIUM 50 MCG PO TABS: 50.0000 ug | ORAL_TABLET | Freq: Every morning | ORAL | 2 refills | Status: DC

## 2022-03-23 MED ORDER — LOSARTAN POTASSIUM 25 MG PO TABS: ORAL_TABLET | ORAL | 2 refills | Status: DC

## 2022-03-23 MED ORDER — ATORVASTATIN CALCIUM 10 MG PO TABS: 10.0000 mg | ORAL_TABLET | Freq: Every day | ORAL | 3 refills | Status: DC

## 2022-03-29 ENCOUNTER — Telehealth: Payer: Self-pay | Admitting: Adult Health

## 2022-03-29 NOTE — Telephone Encounter (Signed)
Patient stopped by to change pharmacies. Patient is no longer using [facility] and wants none of his prescriptions to go there. Please now send to the [facility] unless otherwise stated.      Please advise

## 2022-03-29 NOTE — Telephone Encounter (Signed)
Pharmacy was updated in EMR.

## 2022-04-07 ENCOUNTER — Telehealth: Payer: Self-pay | Admitting: Adult Health

## 2022-04-07 ENCOUNTER — Encounter: Payer: Self-pay | Admitting: Adult Health

## 2022-04-07 ENCOUNTER — Ambulatory Visit (INDEPENDENT_AMBULATORY_CARE_PROVIDER_SITE_OTHER): Payer: PPO | Admitting: Adult Health

## 2022-04-07 VITALS — BP 130/78 | HR 57 | Temp 98.0°F | Ht 65.0 in | Wt 196.0 lb

## 2022-04-07 DIAGNOSIS — S46811A Strain of other muscles, fascia and tendons at shoulder and upper arm level, right arm, initial encounter: Secondary | ICD-10-CM

## 2022-04-07 DIAGNOSIS — T148XXA Other injury of unspecified body region, initial encounter: Secondary | ICD-10-CM

## 2022-04-07 DIAGNOSIS — M19042 Primary osteoarthritis, left hand: Secondary | ICD-10-CM | POA: Diagnosis not present

## 2022-04-07 MED ORDER — PREDNISONE 10 MG PO TABS: 10.0000 mg | ORAL_TABLET | Freq: Every day | ORAL | 0 refills | Status: DC

## 2022-04-07 NOTE — Progress Notes (Signed)
Subjective:    Patient ID: Glenn Ortega, male    DOB: Oct 24, 1933, 88 y.o.   MRN: [id number]  HPI 88 year old male who  has a past medical history of Atrial fibrillation ([place]), Back pain, GERD (gastroesophageal reflux disease), Hyperlipidemia, Hypertension, Spinal stenosis, and Thyroid trouble.  He presents to the office today for the complaint of left hand pain and right shoulder pain.   Left Hand Pain -symptoms have been present for roughly 1 week.  He reports difficulty with closing his left hand and has decreased grip strength.  Pain is located across the dorsal aspect.  Does have a history of carpal tunnel syndrome but denies similar symptoms.  He does not feel as though his hand falls asleep.  Denies trauma or aggravating injury.  Has not had any falls.  He has not noticed any bruising, redness, warmth, or swelling of the left hand.  Additionally he has had some right shoulder pain for the last week or so.  Pain is located in the shoulder and radiates down the back of his right upper arm.  He believes that he may have injured his shoulder/upper arm at work.  Denies any loss of range of motion or grip strength in his right arm.  Pain is worse when he sleeps on his right shoulder   Review of Systems See HPI   Past Medical History:  Diagnosis Date   Atrial fibrillation (HCC)    Back pain    GERD (gastroesophageal reflux disease)    Hyperlipidemia    Hypertension    EKG and chest x ray 8/12 [facility]/states had stress test 2 yrs ago- doesnt remember where- not in [facility]   Spinal stenosis    lumbar   Thyroid trouble     Social History   Socioeconomic History   Marital status: Widowed    Spouse name: [name]   Number of children: Not on file   Years of education: Not on file   Highest education level: Not on file  Occupational History   Occupation: Retired  Tobacco Use   Smoking status: Never   Smokeless tobacco: Never   Tobacco comments:    per patient he never smoke. 11/25/2014  /rc  Vaping Use   Vaping Use: Never used  Substance and Sexual Activity   Alcohol use: Yes    Alcohol/week: 2.0 standard drinks of alcohol    Types: 2 Standard drinks or equivalent per week    Comment: socially- occ beer   Drug use: No   Sexual activity: Not Currently  Other Topics Concern   Not on file  Social History Narrative   Works 3 nights a week at [employer].    Previously worked as a Freight forwarder for [employer].   2 caffeinated drinks per day. Does not exercise he says secondary to back problems.  Lives in a one story home.     Has 2 children.  Education: some college. He is an is in [facility] from [place]   Wife passed from [place] in 2021.    Social Determinants of Health   Financial Resource Strain: Low Risk  (02/22/2022)   Overall Financial Resource Strain (CARDIA)    Difficulty of Paying Living Expenses: Not hard at all  Food Insecurity: No Food Insecurity (02/22/2022)   Hunger Vital Sign    Worried About Running Out of Food in the Last Year: Never true    Ran Out of Food in the Last Year: Never  true  Transportation Needs: No Transportation Needs (02/22/2022)   PRAPARE - Hydrologist (Medical): No    Lack of Transportation (Non-Medical): No  Physical Activity: Sufficiently Active (02/22/2022)   Exercise Vital Sign    Days of Exercise per Week: 2 days    Minutes of Exercise per Session: 90 min  Stress: No Stress Concern Present (02/22/2022)   [place]    Feeling of Stress : Not at all  Social Connections: Not on file  Intimate Partner Violence: Not on file    Past Surgical History:  Procedure Laterality Date   CARPAL TUNNEL RELEASE Right    09/2021   COLONOSCOPY     CYSTOSCOPY  12/30/2011   Procedure: CYSTOSCOPY;  Surgeon: Reece Packer, MD;  Location: [facility];  Service: Urology;  Laterality: N/A;   EYE SURGERY Bilateral 06/18/2017   cataract sx with  lens implant   HERNIA REPAIR  2013   HERNIA REPAIR Right    age  52   LUMBAR LAMINECTOMY/DECOMPRESSION MICRODISCECTOMY N/A 11/09/2013   Procedure: Lumbar Laminectomy/Decompression, Microdiscectomy Lumbar Three-Four, Four-Five, with Coflex;  Surgeon: Faythe Ghee, MD;  Location: [facility];  Service: Neurosurgery;  Laterality: N/A;  Lumbar Laminectomy/Decompression, Microdiscectomy Lumbar Three-Four, Four-Five, with Coflex   PAROTIDECTOMY Left 11/17/2015   Procedure: LEFT PAROTIDECTOMY;  Surgeon: Izora Gala, MD;  Location: [facility];  Service: ENT;  Laterality: Left;   PROSTATE SURGERY  2013   SALIVARY GLAND SURGERY     TONSILLECTOMY     trigger thumb  1980   right    Family History  Problem Relation Age of Onset   Hypertension Other    Cancer Mother        bone?   Myasthenia gravis Father    Obesity Father    Heart disease Brother     Allergies  Allergen Reactions   Penicillins Swelling    CHILDHOOD ALLERGY Has patient had a PCN reaction causing immediate rash, facial/tongue/throat swelling, SOB or lightheadedness with hypotension: Yes Has patient had a PCN reaction causing severe rash involving mucus membranes or skin necrosis: No Has patient had a PCN reaction that required hospitalization: No Has patient had a PCN reaction occurring within the last 10 years: No If all of the above answers are "NO", then may proceed with Cephalosporin use.    Gabapentin Other (See Comments)    "MADE ME FEEL CRAZY"   Lisinopril Cough   Penicillin G Hives    Current Outpatient Medications on File Prior to Visit  Medication Sig Dispense Refill   apixaban (ELIQUIS) 5 MG TABS tablet Take 1 tablet (5 mg total) by mouth 2 (two) times daily. 180 tablet 1   atorvastatin (LIPITOR) 10 MG tablet Take 1 tablet (10 mg total) by mouth daily. 90 tablet 3   furosemide (LASIX) 20 MG tablet Take 1 tablet each day for 4 days. Then take 1 tablet every other day. If your swelling is  gone, then take a tablet as needed for the swelling. 90 tablet 3   levothyroxine (SYNTHROID) 50 MCG tablet Take 1 tablet (50 mcg total) by mouth every morning. 90 tablet 2   losartan (COZAAR) 25 MG tablet TAKE ONE TABLET BY MOUTH DAILY 90 tablet 2   Multiple Vitamins-Minerals (PRESERVISION AREDS 2 PO) Take 1 capsule by mouth 2 (two) times daily.     metoprolol succinate (TOPROL-XL) 25 MG 24 hr tablet Take 0.5 tablets (  12.5 mg total) by mouth daily. Take with or immediately following a meal. 45 tablet 3   No current facility-administered medications on file prior to visit.    BP 130/78   Pulse (!) 57   Temp 98 F (36.7 C) (Oral)   Ht '5\' 5"'$  (1.651 m)   Wt 196 lb (88.9 kg)   SpO2 95%   BMI 32.62 kg/m       Objective:   Physical Exam Vitals and nursing note reviewed.  Constitutional:      Appearance: Normal appearance.  Cardiovascular:     Rate and Rhythm: Normal rate.  Musculoskeletal:     Right shoulder: No swelling, tenderness, bony tenderness or crepitus. Normal range of motion.     Right upper arm: Tenderness (soft tissue tender to tricept area) present.     Left upper arm: Bony tenderness present. No swelling, edema, deformity or tenderness.     Comments: Has decreased grip strength in left hand. Pain with palpation throughout dorsal and palmer aspect   Skin:    General: Skin is warm and dry.  Neurological:     General: No focal deficit present.     Mental Status: He is alert and oriented to person, place, and time.  Psychiatric:        Mood and Affect: Mood normal.        Behavior: Behavior normal.        Thought Content: Thought content normal.       Assessment & Plan:  1. Primary osteoarthritis of left hand -Seems to be related to osteoarthritis.  He is on Eliquis and cannot take anti-inflammatories.  Tylenol is not cutting it for pain relief.  We will trial him on some prednisone to see if we can get him some relief. - predniSONE (DELTASONE) 10 MG tablet; Take 1  tablet (10 mg total) by mouth daily with breakfast.  Dispense: 10 tablet; Refill: 0  2. Muscle strain -Seems to be more muscular in nature.  Advised rest and heat.  Prednisone may also help with this. -Follow-up if symptoms not resolving  [name], NP

## 2022-04-07 NOTE — Telephone Encounter (Signed)
Pt has been scheduled.  °

## 2022-04-07 NOTE — Telephone Encounter (Signed)
Pt does not want to make an appt and would like cory to return his call he is having sciatica pain

## 2022-04-19 ENCOUNTER — Other Ambulatory Visit: Payer: Self-pay | Admitting: Family

## 2022-04-19 ENCOUNTER — Telehealth: Payer: Self-pay | Admitting: Adult Health

## 2022-04-19 ENCOUNTER — Ambulatory Visit (INDEPENDENT_AMBULATORY_CARE_PROVIDER_SITE_OTHER): Payer: PPO | Admitting: Family

## 2022-04-19 VITALS — BP 138/70 | HR 48 | Temp 98.1°F | Wt 196.0 lb

## 2022-04-19 DIAGNOSIS — I10 Essential (primary) hypertension: Secondary | ICD-10-CM | POA: Diagnosis not present

## 2022-04-19 DIAGNOSIS — M5416 Radiculopathy, lumbar region: Secondary | ICD-10-CM

## 2022-04-19 DIAGNOSIS — I4811 Longstanding persistent atrial fibrillation: Secondary | ICD-10-CM

## 2022-04-19 MED ORDER — PREDNISONE 20 MG PO TABS: 20.0000 mg | ORAL_TABLET | Freq: Every day | ORAL | 0 refills | Status: DC

## 2022-04-19 MED ORDER — METOPROLOL SUCCINATE ER 25 MG PO TB24: 12.5000 mg | ORAL_TABLET | Freq: Every day | ORAL | 1 refills | Status: DC

## 2022-04-19 MED ORDER — OXYCODONE-ACETAMINOPHEN 10-325 MG PO TABS
1.0000 | ORAL_TABLET | Freq: Three times a day (TID) | ORAL | 0 refills | Status: AC | PRN
Start: 2022-04-19 — End: 2022-04-24

## 2022-04-19 NOTE — Progress Notes (Signed)
Acute Office Visit  Subjective:     Patient ID: Glenn Ortega, male    DOB: 27-Sep-1934, 88 y.o.   MRN: [id number]  Chief Complaint  Patient presents with  . Leg Pain    Constant pain in left leg starts from hip to knee. OV on 04/07/22, was given steroids that have not been effective. Took some of wife's oxycodone & that was helpful    HPI Patient is in today with c/o left leg pain from the hip that radiates down to the left knee. Has been on Prednisone 10 mg tabs that he reports have not helped his symptoms. Pain 9/10, worse with walking. Able to sleep ok. Has taken a few of his wife's Oxycodone that help a lot. He is concerned because he has to go back to work on Wednesday. Is a truck driver 4 days per week  Review of Systems  Musculoskeletal:  Positive for back pain.  Neurological:  Negative for dizziness and loss of consciousness.       Upper Leg pain that radiates to the left knee  All other systems reviewed and are negative.      Objective:    BP 138/70 (BP Location: Right Arm, Patient Position: Sitting, Cuff Size: Normal)   Pulse (!) 48   Temp 98.1 F (36.7 C) (Oral)   Wt 196 lb (88.9 kg)   SpO2 97%   BMI 32.62 kg/m    Physical Exam Vitals and nursing note reviewed.  Constitutional:      Appearance: Normal appearance.     Comments: Ambulates with a cane  Cardiovascular:     Rate and Rhythm: Normal rate and regular rhythm.  Pulmonary:     Effort: Pulmonary effort is normal.     Breath sounds: Normal breath sounds.  Musculoskeletal:     Cervical back: Normal range of motion and neck supple.     Comments: No tenderness to palpation of the lumbar spine. Pain with flexion at the hips Tenderness to palpation of the left sciatic nerve in the gluteal area.   Skin:    General: Skin is warm and dry.  Neurological:     General: No focal deficit present.     Mental Status: He is alert and oriented to person, place, and time.   No results found for any visits on  04/19/22.      Assessment & Plan:   Problem List Items Addressed This Visit     Essential hypertension   Relevant Medications   metoprolol succinate (TOPROL-XL) 25 MG 24 hr tablet   Other Visit Diagnoses     Lumbar radiculopathy    -  Primary   Longstanding persistent atrial fibrillation (HCC)       Relevant Medications   metoprolol succinate (TOPROL-XL) 25 MG 24 hr tablet       Meds ordered this encounter  Medications  . metoprolol succinate (TOPROL-XL) 25 MG 24 hr tablet    Sig: Take 0.5 tablets (12.5 mg total) by mouth daily. Take with or immediately following a meal.    Dispense:  45 tablet    Refill:  1  . oxyCODONE-acetaminophen (PERCOCET) 10-325 MG tablet    Sig: Take 1 tablet by mouth every 8 (eight) hours as needed for up to 5 days for pain.    Dispense:  15 tablet    Refill:  0   Temp supply of Oxycodone prescribed PRN  Has 4 tabs of Prednisone '10mg'$ . Advised take 2 today and 2  tomorrow.  Call the office if symptoms worsen or persist.  Recheck as scheduled and sooner as needed.   No follow-ups on file.  Kennyth Arnold, FNP

## 2022-04-19 NOTE — Telephone Encounter (Signed)
Pt states he was in with Dorothy Puffer earlier and discussed prednisone for his condition. Thought he had more at home but thinks he may need a few more. requesting a prescription for prednisone to be sent to  [facility] [id number] [place], [place] Phone:  [phone]  Fax:  [phone]

## 2022-04-21 ENCOUNTER — Telehealth: Payer: Self-pay | Admitting: Adult Health

## 2022-04-21 DIAGNOSIS — M5416 Radiculopathy, lumbar region: Secondary | ICD-10-CM | POA: Diagnosis not present

## 2022-04-21 NOTE — Telephone Encounter (Signed)
Pt has a pain in his leg, thinks it sciatic nerve, prescribed medication, not helping. Wants to know if there is another course of treatment

## 2022-04-22 DIAGNOSIS — M48061 Spinal stenosis, lumbar region without neurogenic claudication: Secondary | ICD-10-CM | POA: Diagnosis not present

## 2022-04-22 DIAGNOSIS — M4726 Other spondylosis with radiculopathy, lumbar region: Secondary | ICD-10-CM | POA: Diagnosis not present

## 2022-04-23 NOTE — Telephone Encounter (Signed)
Patient notified of update  and verbalized understanding. 

## 2022-04-26 DIAGNOSIS — M4316 Spondylolisthesis, lumbar region: Secondary | ICD-10-CM | POA: Diagnosis not present

## 2022-05-04 ENCOUNTER — Telehealth: Payer: Self-pay

## 2022-05-04 NOTE — Telephone Encounter (Signed)
FMLA PPW received via fax. PPW placed on providers desk.

## 2022-05-05 DIAGNOSIS — M5416 Radiculopathy, lumbar region: Secondary | ICD-10-CM | POA: Diagnosis not present

## 2022-05-11 ENCOUNTER — Encounter: Payer: Self-pay | Admitting: Adult Health

## 2022-05-11 ENCOUNTER — Telehealth (INDEPENDENT_AMBULATORY_CARE_PROVIDER_SITE_OTHER): Payer: PPO | Admitting: Adult Health

## 2022-05-11 VITALS — Ht 65.0 in | Wt 196.0 lb

## 2022-05-11 DIAGNOSIS — M5416 Radiculopathy, lumbar region: Secondary | ICD-10-CM | POA: Diagnosis not present

## 2022-05-11 NOTE — Progress Notes (Signed)
Virtual Visit via Telephone Note  I connected with Glenn Ortega on 05/11/22 at  1:45 PM EDT by telephone and verified that I am speaking with the correct person using two identifiers.   I discussed the limitations, risks, security and privacy concerns of performing an evaluation and management service by telephone and the availability of in person appointments. I also discussed with the patient that there may be a patient responsible charge related to this service. The patient expressed understanding and agreed to proceed.  Location patient: home Location provider: work or home office Participants present for the call: patient, provider Patient did not have a visit in the prior 7 days to address this/these issue(s).   History of Present Illness: 88 year old male who  has a past medical history of Atrial fibrillation ([name]), Back pain, GERD (gastroesophageal reflux disease), Hyperlipidemia, Hypertension, Spinal stenosis, and Thyroid trouble.  He is being evaluated today for need of FMLA paperwork filled out due to lumbar radiculopathy.  He was seen by another provider in the office on 04/19/2022.  At this time he had left leg pain from the hip that radiated down to the left knee.  He has been on prednisone 10 mg tabs but it had not helped with his symptoms.  He was prescribed a short course of Norco.  He ultimately then was seen by Dr. Maylon [facility] [place].  An MRI was ordered and then he received bilateral epidural injections last Wednesday for herniated disc at L2-L3.  He does report that this has resolved his pain and he is able to go back to work.   Unfortunately, on another note he reports that he will be switching his primary care to Dr. Madilyn Ortega at [facility].  This is easier for him in case he needs to be seen.   Observations/Objective: Patient sounds cheerful and well on the phone. I do not appreciate any SOB. Speech and thought  processing are grossly intact. Patient reported vitals:  Assessment and Plan: 1. Lumbar radiculopathy -I am glad he is feeling better.  FMLA paperwork will be filled out and faxed back to his employer.  He was wished the very best in the future and it was a pleasure taking care of him for the last few years.   Follow Up Instructions:  I did not refer this patient for an OV in the next 24 hours for this/these issue(s).  I discussed the assessment and treatment plan with the patient. The patient was provided an opportunity to ask questions and all were answered. The patient agreed with the plan and demonstrated an understanding of the instructions.   The patient was advised to call back or seek an in-person evaluation if the symptoms worsen or if the condition fails to improve as anticipated.  I provided 18 minutes of non-face-to-face time during this encounter.   [name], NP

## 2022-05-12 ENCOUNTER — Ambulatory Visit: Payer: PPO | Admitting: Family Medicine

## 2022-05-17 DIAGNOSIS — M79671 Pain in right foot: Secondary | ICD-10-CM | POA: Diagnosis not present

## 2022-05-17 DIAGNOSIS — M25775 Osteophyte, left foot: Secondary | ICD-10-CM | POA: Diagnosis not present

## 2022-05-17 DIAGNOSIS — M25774 Osteophyte, right foot: Secondary | ICD-10-CM | POA: Diagnosis not present

## 2022-05-17 DIAGNOSIS — L600 Ingrowing nail: Secondary | ICD-10-CM | POA: Diagnosis not present

## 2022-05-17 DIAGNOSIS — M79672 Pain in left foot: Secondary | ICD-10-CM | POA: Diagnosis not present

## 2022-05-26 ENCOUNTER — Encounter (INDEPENDENT_AMBULATORY_CARE_PROVIDER_SITE_OTHER): Payer: Self-pay

## 2022-05-31 ENCOUNTER — Other Ambulatory Visit: Payer: Self-pay | Admitting: Adult Health

## 2022-05-31 ENCOUNTER — Ambulatory Visit (INDEPENDENT_AMBULATORY_CARE_PROVIDER_SITE_OTHER): Payer: PPO | Admitting: Family Medicine

## 2022-05-31 ENCOUNTER — Encounter: Payer: Self-pay | Admitting: Family Medicine

## 2022-05-31 VITALS — BP 107/84 | HR 62 | Ht 65.0 in | Wt 200.0 lb

## 2022-05-31 DIAGNOSIS — M25511 Pain in right shoulder: Secondary | ICD-10-CM | POA: Diagnosis not present

## 2022-05-31 DIAGNOSIS — I480 Paroxysmal atrial fibrillation: Secondary | ICD-10-CM

## 2022-05-31 DIAGNOSIS — M79642 Pain in left hand: Secondary | ICD-10-CM | POA: Diagnosis not present

## 2022-05-31 DIAGNOSIS — I10 Essential (primary) hypertension: Secondary | ICD-10-CM

## 2022-05-31 DIAGNOSIS — T8189XA Other complications of procedures, not elsewhere classified, initial encounter: Secondary | ICD-10-CM | POA: Diagnosis not present

## 2022-05-31 NOTE — Patient Instructions (Signed)
And use Voltaren gel on your knuckles. See handout for exercises for your right shoulder.  If it is getting worse or not improving then please let us know or can follow-up with your orthopedist.

## 2022-05-31 NOTE — Assessment & Plan Note (Signed)
Pressure looks great today if in fact it is a little bit on the lower end.

## 2022-05-31 NOTE — Assessment & Plan Note (Signed)
On Eliquis and metoprolol.

## 2022-05-31 NOTE — Progress Notes (Signed)
Established Patient Office Visit  Subjective   Patient ID: Glenn Ortega, male    DOB: [birth year]  Age: 88 y.o. MRN: [id number]  Chief Complaint  Patient presents with   Establish Care    HPI  Here today to reestablish care.  He recently was having some problems with his low back and received bilateral epidurals it has been helpful.  In fact he is back at work he works 4 days a week.  He transferred back here because he is able to get her more easily now he had been taking care of his very sick wife for a couple of years.  She actually passed away recently on hospice.  He is also having some right shoulder pain and discomfort he says it mostly bothers him at night when he turns over onto that shoulder and then wakes up with pain in it.  He has not noticed a significant decreased range of motion.  No recent injury or trauma.  He has had bursitis before.  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  He also has age-related macular degeneration and goes to the [place] for this.  He does take a supplement.  Was diagnosed with A-fib since I last saw him.  He is now on Eliquis and metoprolol.  Still has some pain over the dorsum of his left hand as well as some swelling and decreased flexion and its been bothersome for about a week or so.  No recent injury or trauma.    ROS    Objective:     BP 107/84   Pulse 62   Ht '5\' 5"'$  (1.651 m)   Wt 200 lb (90.7 kg)   SpO2 99%   BMI 33.28 kg/m    Physical Exam Constitutional:      Appearance: He is well-developed.  HENT:     Head: Normocephalic and atraumatic.  Cardiovascular:     Rate and Rhythm: Normal rate. Rhythm irregular.     Heart sounds: Normal heart sounds.  Pulmonary:     Effort: Pulmonary effort is normal.     Breath sounds: Normal breath sounds.  Skin:    General:  Skin is warm and dry.  Neurological:     Mental Status: He is alert and oriented to person, place, and time.  Psychiatric:        Behavior: Behavior normal.      No results found for any visits on 05/31/22.    The ASCVD Risk score (Arnett DK, et al., 2019) failed to calculate for the following reasons:   The 2019 ASCVD risk score is only valid for ages 8 to 44    Assessment & Plan:   Problem List Items Addressed This Visit       Cardiovascular and Mediastinum   HYPERTENSION, BENIGN    Pressure looks great today if in fact it is a little bit on the lower end.      AF (paroxysmal atrial fibrillation) (HCC)    On Eliquis and metoprolol.      Other Visit Diagnoses     Acute pain of right shoulder    -  Primary   Left hand pain           Shoulder pain, right -most consistent with bursitis.  Though he has some weakness  indicating some rotator cuff injury as well.  Unable to reach full extension.  Doubt given with exercises to do on his own at home.  If not improving or getting worse then please let us know.  Following over the dorsum of the left hand-apply Voltaren gel as needed.  This is now over-the-counter.  If not improving then please let us know.  Back that he has some underlying osteoarthritis with some synovitis causing some inflammation swelling and decreased range of motion.    No follow-ups on file.    [name], MD

## 2022-06-02 DIAGNOSIS — G5602 Carpal tunnel syndrome, left upper limb: Secondary | ICD-10-CM | POA: Diagnosis not present

## 2022-06-11 DIAGNOSIS — T8189XD Other complications of procedures, not elsewhere classified, subsequent encounter: Secondary | ICD-10-CM | POA: Diagnosis not present

## 2022-06-28 DIAGNOSIS — T8189XD Other complications of procedures, not elsewhere classified, subsequent encounter: Secondary | ICD-10-CM | POA: Diagnosis not present

## 2022-07-13 ENCOUNTER — Encounter: Payer: Self-pay | Admitting: Family Medicine

## 2022-07-13 ENCOUNTER — Ambulatory Visit (INDEPENDENT_AMBULATORY_CARE_PROVIDER_SITE_OTHER): Payer: PPO | Admitting: Family Medicine

## 2022-07-13 ENCOUNTER — Ambulatory Visit (INDEPENDENT_AMBULATORY_CARE_PROVIDER_SITE_OTHER): Payer: PPO

## 2022-07-13 VITALS — BP 129/63 | HR 72 | Ht 66.0 in | Wt 200.0 lb

## 2022-07-13 DIAGNOSIS — M79642 Pain in left hand: Secondary | ICD-10-CM

## 2022-07-13 NOTE — Progress Notes (Signed)
   Acute Office Visit  Subjective:     Patient ID: Glenn Ortega, male    DOB: 1934-01-31, 88 y.o.   MRN: [id number]  Chief Complaint  Patient presents with   Hand Pain    Left hand pain    HPI Patient is in today for Left hand pain x 1 month.  I last saw him we recommended a trial of Voltaren gel.  He says it really did not help so he quit using it and he was worried since his dog tends to lick his hands.  He is on Eliquis so cannot take NSAIDs.  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  Pain is mostly over the MCPs, down the index finger and down the fifth metacarpal bone in the hand.  ROS      Objective:    BP 129/63   Pulse 72   Ht '5\' 6"'$  (1.676 m)   Wt 200 lb (90.7 kg)   SpO2 99%   BMI 32.28 kg/m    Physical Exam Vitals reviewed.  Constitutional:      Appearance: He is well-developed.  HENT:     Head: Normocephalic and atraumatic.  Eyes:     Conjunctiva/sclera: Conjunctivae normal.  Cardiovascular:     Rate and Rhythm: Normal rate.  Pulmonary:     Effort: Pulmonary effort is normal.  Musculoskeletal:     Comments: Nontender directly over the MCP joints though when I did squeeze his hand he had some discomfort.  He is unable to fully flex his fingers.  But he has good strength in all of them.  He has a little bit of swelling over his posterior wrist and he has a mallet finger  Skin:    General: Skin is dry.     Coloration: Skin is not pale.  Neurological:     Mental Status: He is alert and oriented to person, place, and time.  Psychiatric:        Behavior: Behavior normal.     No results found for any visits on 07/13/22.      Assessment & Plan:   Problem List Items Addressed This Visit   None Visit Diagnoses     Left hand pain    -  Primary   Relevant Orders   Sedimentation rate   C-reactive protein   ANA   Cyclic citrul peptide antibody, IgG   Rheumatoid factor   DG Hand Complete Left      Left hand  pain mostly over the joints -since he did not improve with topical. Will recommend trial fo Tylenol Arthritis for pain relief.  We will go ahead and get a plain film x-ray as well as do some additional labs.  No orders of the defined types were placed in this encounter.   No follow-ups on file.  [name], MD

## 2022-07-14 NOTE — Progress Notes (Signed)
Hi Bob, the sed rate is just mildly elevated but less than its been in the past so that is reassuring though your CRP which is an also inflammatory marker is up and it can relate more to cardiac disease.  Still awaiting the additional test.

## 2022-07-15 LAB — C-REACTIVE PROTEIN: CRP: 20.7 mg/L — ABNORMAL HIGH (ref <8.0)

## 2022-07-15 LAB — CYCLIC CITRUL PEPTIDE ANTIBODY, IGG: Cyclic Citrullin Peptide Ab: <16 UNITS

## 2022-07-15 LAB — ANTI-NUCLEAR AB-TITER (ANA TITER)
ANA TITER: 1:320 {titer} — ABNORMAL HIGH
ANA TITER: 1:80 {titer} — ABNORMAL HIGH
ANA Titer 1: 1:80 {titer} — ABNORMAL HIGH

## 2022-07-15 LAB — SEDIMENTATION RATE: Sed Rate: 22 mm/h — ABNORMAL HIGH (ref 0–20)

## 2022-07-15 LAB — RHEUMATOID FACTOR: Rheumatoid fact SerPl-aCnc: <14 IU/mL (ref <14)

## 2022-07-15 LAB — ANA: Anti Nuclear Antibody (ANA): POSITIVE — AB

## 2022-07-15 NOTE — Progress Notes (Signed)
Scheduled for 07/19/2022- tvt

## 2022-07-15 NOTE — Progress Notes (Signed)
Hi Bob, x-ray shows some pretty advanced arthritis in the distal joint in your fingers particularly in the second and third finger, and near your wrist.  They actually did not see anything concerning over your knuckles which I know is where you were mostly having some pain.  I like to get you in with Dr. Dianah Field our sports medicine doctor for further evaluation for your hand pain.

## 2022-07-19 ENCOUNTER — Ambulatory Visit (INDEPENDENT_AMBULATORY_CARE_PROVIDER_SITE_OTHER): Payer: PPO

## 2022-07-19 ENCOUNTER — Ambulatory Visit (INDEPENDENT_AMBULATORY_CARE_PROVIDER_SITE_OTHER): Payer: PPO | Admitting: Sports Medicine

## 2022-07-19 DIAGNOSIS — G5603 Carpal tunnel syndrome, bilateral upper limbs: Secondary | ICD-10-CM | POA: Diagnosis not present

## 2022-07-19 NOTE — Progress Notes (Signed)
Glenn Ortega, the test for lupus was a little bit borderline.  The blood level itself does not make the diagnosis is a conglomeration of symptoms and testing.  But it is suspicious enough with your recent hand pain and swelling that I would like for you to consider seeing a rheumatologist.  If you are okay with that then please let me know and I can make a referral.  If you have a preference for provider or location then please let me know.

## 2022-07-19 NOTE — Progress Notes (Signed)
    Procedures performed today:    Procedure: Real-time Ultrasound Guided hydrodissection of the left median nerve at the carpal tunnel Device: Samsung HS60 Verbal informed consent obtained.  Time-out conducted.  Noted no overlying erythema, induration, or other signs of local infection.  Skin prepped in a sterile fashion.  Local anesthesia: Topical Ethyl chloride.  With sterile technique and under real time ultrasound guidance: Noted enlarged median nerve.  Using a 25-gauge needle advanced into the carpal tunnel, taking care to avoid intraneural injection I injected medication both superficial to and deep to the median nerve freeing it from surrounding structures, I then redirected the needle deep and injected further medication around the flexor tendons deep within the carpal tunnel for a total of 1 cc kenalog 40, 5 cc 1% lidocaine without epinephrine. Completed without difficulty  Advised to call if fevers/chills, erythema, induration, drainage, or persistent bleeding.  Images permanently stored and available for review in PACS.  Impression: Technically successful ultrasound guided median nerve hydrodissection.  Independent interpretation of notes and tests performed by another provider:   None.  Brief History, Exam, Impression, and Recommendations:    Carpal tunnel syndrome, bilateral This is a very pleasant 88 year old male, he has known bilateral carpal tunnel syndrome, we last did an injection back in 2021 and he did really well. He still works, fairly heavy laborious manual labor. Unfortunately having recurrence of pain left hand volar aspect with radiation to the third finger, no overt triggering, occasional achiness, numbness and tingling in a carpal tunnel distribution as well. On exam he has a negative carpal tunnel Tinel's sign, he does have tenderness volar third flexor tendon sheath without triggering. Today we did a carpal tunnel injection, the medication will run down the  flexor tendon sheath and help his third flexor tenosynovitis as well. Return to see me in about 4 weeks.    ____________________________________________ Gwen Her. Dianah Field, M.D., ABFM., CAQSM., AME. Primary Care and Sports Medicine [facility]  Adjunct Professor of [place] of [facility] of Medicine  Risk manager

## 2022-07-19 NOTE — Assessment & Plan Note (Signed)
This is a very pleasant 88 year old male, he has known bilateral carpal tunnel syndrome, we last did an injection back in 2021 and he did really well. He still works, fairly heavy laborious manual labor. Unfortunately having recurrence of pain left hand volar aspect with radiation to the third finger, no overt triggering, occasional achiness, numbness and tingling in a carpal tunnel distribution as well. On exam he has a negative carpal tunnel Tinel's sign, he does have tenderness volar third flexor tendon sheath without triggering. Today we did a carpal tunnel injection, the medication will run down the flexor tendon sheath and help his third flexor tenosynovitis as well. Return to see me in about 4 weeks.

## 2022-07-20 ENCOUNTER — Other Ambulatory Visit: Payer: Self-pay | Admitting: *Deleted

## 2022-07-20 DIAGNOSIS — R768 Other specified abnormal immunological findings in serum: Secondary | ICD-10-CM

## 2022-08-03 ENCOUNTER — Telehealth: Payer: Self-pay | Admitting: Cardiovascular Disease

## 2022-08-03 NOTE — Telephone Encounter (Signed)
Spoke to patient Eliquis 5 mg samples left at [employer] office front desk.

## 2022-08-03 NOTE — Telephone Encounter (Signed)
Pt c/o medication issue:  1. Name of Medication: apixaban (ELIQUIS) 5 MG TABS tablet  2. How are you currently taking this medication (dosage and times per day)? Take 1 tablet (5 mg total) by mouth 2 (two) times daily.  3. Are you having a reaction (difficulty breathing--STAT)? no  4. What is your medication issue? Patient is in the donut whole with the medication. Calling to see what his options are

## 2022-08-16 ENCOUNTER — Ambulatory Visit (INDEPENDENT_AMBULATORY_CARE_PROVIDER_SITE_OTHER): Payer: PPO

## 2022-08-16 ENCOUNTER — Encounter: Payer: Self-pay | Admitting: Sports Medicine

## 2022-08-16 ENCOUNTER — Ambulatory Visit (INDEPENDENT_AMBULATORY_CARE_PROVIDER_SITE_OTHER): Payer: PPO | Admitting: Sports Medicine

## 2022-08-16 DIAGNOSIS — M65842 Other synovitis and tenosynovitis, left hand: Secondary | ICD-10-CM

## 2022-08-16 DIAGNOSIS — G5603 Carpal tunnel syndrome, bilateral upper limbs: Secondary | ICD-10-CM | POA: Diagnosis not present

## 2022-08-16 NOTE — Assessment & Plan Note (Signed)
Known bilateral carpal tunnel syndrome, injection on the left at the last visit has resolved the majority of his carpal tunnel symptoms.

## 2022-08-16 NOTE — Progress Notes (Signed)
    Procedures performed today:    Procedure: Real-time Ultrasound Guided injection of the left third flexor tendon sheath Device: Samsung HS60  Verbal informed consent obtained.  Time-out conducted.  Noted no overlying erythema, induration, or other signs of local infection.  Skin prepped in a sterile fashion.  Local anesthesia: Topical Ethyl chloride.  With sterile technique and under real time ultrasound guidance: Noted flexor tendon nodule, 1/2 cc lidocaine, 1/2 cc kenalog 40 injected easily. Completed without difficulty  Advised to call if fevers/chills, erythema, induration, drainage, or persistent bleeding.  Images permanently stored and available for review in PACS.  Impression: Technically successful ultrasound guided injection.  Independent interpretation of notes and tests performed by another provider:   None.  Brief History, Exam, Impression, and Recommendations:    Stenosing tenosynovitis of finger of left hand Carpal tunnel syndrome symptoms improved considerably after median nerve hydrodissection at the last visit, still has some pain associated with the third flexor tendon sheath on the left, we did a third flexor tendon sheath injection today, return as needed.  Carpal tunnel syndrome, bilateral Known bilateral carpal tunnel syndrome, injection on the left at the last visit has resolved the majority of his carpal tunnel symptoms.  Chronic process with exacerbation and pharmacologic intervention  ____________________________________________ Gwen Her. Dianah Field, M.D., ABFM., CAQSM., AME. Primary Care and Sports Medicine [facility]  Adjunct Professor of [place] [employer] of Medicine  Risk manager

## 2022-08-16 NOTE — Assessment & Plan Note (Signed)
Carpal tunnel syndrome symptoms improved considerably after median nerve hydrodissection at the last visit, still has some pain associated with the third flexor tendon sheath on the left, we did a third flexor tendon sheath injection today, return as needed.

## 2022-08-24 ENCOUNTER — Telehealth: Payer: Self-pay | Admitting: *Deleted

## 2022-08-24 NOTE — Telephone Encounter (Signed)
Pt called and wanted Dr. Madilyn Fireman to know that he had seen Dr. Darene Lamer and he gave him shots in his hands. He stated that his hands are much better and that Dr. Madilyn Fireman had put a referral in for him to be seen by the rheumatologist and he doesn't feel that he needs to go now and he did call their office to cancel the appointment. He just wanted to make her aware of this.

## 2022-08-30 DIAGNOSIS — H903 Sensorineural hearing loss, bilateral: Secondary | ICD-10-CM | POA: Diagnosis not present

## 2022-09-06 ENCOUNTER — Encounter: Payer: Self-pay | Admitting: Family Medicine

## 2022-09-06 ENCOUNTER — Ambulatory Visit (INDEPENDENT_AMBULATORY_CARE_PROVIDER_SITE_OTHER): Payer: PPO | Admitting: Family Medicine

## 2022-09-06 VITALS — BP 129/58 | HR 58 | Ht 66.0 in | Wt 201.1 lb

## 2022-09-06 DIAGNOSIS — I10 Essential (primary) hypertension: Secondary | ICD-10-CM | POA: Diagnosis not present

## 2022-09-06 DIAGNOSIS — I480 Paroxysmal atrial fibrillation: Secondary | ICD-10-CM

## 2022-09-06 DIAGNOSIS — R7309 Other abnormal glucose: Secondary | ICD-10-CM | POA: Diagnosis not present

## 2022-09-06 DIAGNOSIS — E039 Hypothyroidism, unspecified: Secondary | ICD-10-CM | POA: Diagnosis not present

## 2022-09-06 NOTE — Progress Notes (Signed)
   Established Patient Office Visit  Subjective   Patient ID: Glenn Ortega, male    DOB: [birth year]  Age: 88 y.o. MRN: [id number]  Chief Complaint  Patient presents with   Hypertension   Thyroid Problem    HPI  Hypertension- Pt denies chest pain, SOB, dizziness, or heart palpitations.  Taking meds as directed w/o problems.  Denies medication side effects.    Hypothyroidism - Taking medication regularly in the AM away from food and vitamins, etc. No recent change to skin, hair, or energy levels.  Hypothyroidism - Taking medication regularly in the AM away from food and vitamins, etc. No recent change to skin, hair, or energy levels.      ROS    Objective:     BP (!) 129/58   Pulse (!) 58   Ht '5\' 6"'$  (1.676 m)   Wt 201 lb 1.9 oz (91.2 kg)   SpO2 99%   BMI 32.46 kg/m    Physical Exam Constitutional:      Appearance: He is well-developed.  HENT:     Head: Normocephalic and atraumatic.  Cardiovascular:     Rate and Rhythm: Normal rate and regular rhythm.     Heart sounds: Normal heart sounds.  Pulmonary:     Effort: Pulmonary effort is normal.     Breath sounds: Normal breath sounds.  Skin:    General: Skin is warm and dry.  Neurological:     Mental Status: He is alert and oriented to person, place, and time.  Psychiatric:        Behavior: Behavior normal.      No results found for any visits on 09/06/22.    The ASCVD Risk score (Arnett DK, et al., 2019) failed to calculate for the following reasons:   The 2019 ASCVD risk score is only valid for ages 45 to 32    Assessment & Plan:   Problem List Items Addressed This Visit       Cardiovascular and Mediastinum   HYPERTENSION, BENIGN - Primary    Well controlled. Continue current regimen. Follow up in  6 months.        Relevant Medications   metoprolol succinate (TOPROL-XL) 25 MG 24 hr tablet   Other Relevant Orders   BASIC METABOLIC PANEL WITH GFR   Hemoglobin A1c   AF (paroxysmal atrial  fibrillation) ([place])    We will see if we can get assistance for his Eliquis with occasional clinical pharmacist since he is currently having a hard time affording the medication.  He had his Medicare gap.  Did already apply for patient assistance and did not qualify as he still works.  He says he is no longer taking the metoprolol.      Relevant Medications   metoprolol succinate (TOPROL-XL) 25 MG 24 hr tablet   Other Relevant Orders   BASIC METABOLIC PANEL WITH GFR   Hemoglobin A1c     Endocrine   Hypothyroidism    To recheck TSH.  Tolerating medication well without any side effects or problems.      Relevant Medications   metoprolol succinate (TOPROL-XL) 25 MG 24 hr tablet    Return in about 6 months (around 03/07/2023) for Hypertension, Afib.    [name], MD

## 2022-09-06 NOTE — Assessment & Plan Note (Signed)
To recheck TSH.  Tolerating medication well without any side effects or problems.

## 2022-09-06 NOTE — Assessment & Plan Note (Signed)
Well controlled. Continue current regimen. Follow up in  6 months.  

## 2022-09-06 NOTE — Assessment & Plan Note (Addendum)
We will see if we can get assistance for his Eliquis with occasional clinical pharmacist since he is currently having a hard time affording the medication.  He had his Medicare gap.  Did already apply for patient assistance and did not qualify as he still works.  He says he is no longer taking the metoprolol.

## 2022-09-07 LAB — BASIC METABOLIC PANEL WITH GFR
BUN: 23 mg/dL (ref 7–25)
CO2: 28 mmol/L (ref 20–32)
Calcium: 9.2 mg/dL (ref 8.6–10.3)
Chloride: 107 mmol/L (ref 98–110)
Creat: 1.18 mg/dL (ref 0.70–1.22)
Glucose, Bld: 76 mg/dL (ref 65–99)
Potassium: 4.2 mmol/L (ref 3.5–5.3)
Sodium: 143 mmol/L (ref 135–146)
eGFR: 60 mL/min/{1.73_m2} (ref >=60)

## 2022-09-07 LAB — HEMOGLOBIN A1C
Hgb A1c MFr Bld: 5.7 % of total Hgb — ABNORMAL HIGH (ref <5.7)
Mean Plasma Glucose: 117 mg/dL
eAG (mmol/L): 6.5 mmol/L

## 2022-09-07 NOTE — Progress Notes (Signed)
[name], A1c was up a little bit compared to last year you got it down to 5.3 which was awesome but it was back up to 5.7.  Just encouraged her to continue to work on healthy food choices and regular exercise.  Metabolic panel looks great.

## 2022-09-27 ENCOUNTER — Encounter: Payer: Self-pay | Admitting: Sports Medicine

## 2022-09-27 ENCOUNTER — Ambulatory Visit (INDEPENDENT_AMBULATORY_CARE_PROVIDER_SITE_OTHER): Payer: PPO | Admitting: Sports Medicine

## 2022-09-27 VITALS — Wt 201.0 lb

## 2022-09-27 DIAGNOSIS — M65842 Other synovitis and tenosynovitis, left hand: Secondary | ICD-10-CM | POA: Diagnosis not present

## 2022-09-27 NOTE — Progress Notes (Signed)
    Procedures performed today:    None.  Independent interpretation of notes and tests performed by another provider:   None.  Brief History, Exam, Impression, and Recommendations:    Stenosing tenosynovitis of finger of left hand Now resolved after third flexor tendon sheath injection on the left, return as needed.    ____________________________________________ Gwen Her. Dianah Field, M.D., ABFM., CAQSM., AME. Primary Care and Sports Medicine [place] [facility]  Adjunct Professor of [place] of [facility] of Medicine  Risk manager

## 2022-09-27 NOTE — Assessment & Plan Note (Signed)
Now resolved after third flexor tendon sheath injection on the left, return as needed.

## 2022-10-04 ENCOUNTER — Telehealth: Payer: Self-pay | Admitting: Cardiovascular Disease

## 2022-10-04 ENCOUNTER — Ambulatory Visit
Admission: EM | Admit: 2022-10-04 | Discharge: 2022-10-04 | Disposition: A | Discharge status: Home / Self Care | Payer: PPO | Attending: Family Medicine | Admitting: Family Medicine

## 2022-10-04 DIAGNOSIS — R58 Hemorrhage, not elsewhere classified: Secondary | ICD-10-CM

## 2022-10-04 NOTE — ED Provider Notes (Signed)
[facility]    CSN: [id number] Arrival date & time: 10/04/22  1318      History   Chief Complaint Chief Complaint  Patient presents with   Bleeding/Bruising    On Face    HPI Glenn Ortega is a 88 y.o. male.   HPI  Patient is on Eliquis for chronic atrial fibrillation.  He does not like this medicine.  It is causing him to have a lot of bruising.  He has a bruise on his face right now that he cannot identify.  He is here to see if this constitutes "serious bleeding".  He is not having any nosebleeds, bleeding from the gums, blood noticed in urine or stool.  Past Medical History:  Diagnosis Date   Atrial fibrillation ([place])    Back pain    GERD (gastroesophageal reflux disease)    Hyperlipidemia    Hypertension    EKG and chest x ray 8/12 EPIC/states had stress test 2 yrs ago- doesnt remember where- not in EPIC   Spinal stenosis    lumbar   Thyroid trouble     Patient Active Problem List   Diagnosis Date Noted   Stenosing tenosynovitis of finger of left hand 08/16/2022   Age-related macular degeneration 06/28/2021   Sleep apnea 06/28/2021   Hypokalemia 06/28/2021   Right elbow pain 03/19/2021   Acute bilateral low back pain without sciatica 11/12/2020   Depressed mood, not depression 08/18/2020   Senile purpura ([place]) 08/14/2020   Vitamin D deficiency 07/29/2020   Hypothyroidism 07/15/2020   Hyperglycemia 07/15/2020   BMI 38.0-38.9,adult 06/18/2020   Bilateral lower extremity edema 06/18/2020   AF (paroxysmal atrial fibrillation) ([place]) 06/17/2020   BPPV (benign paroxysmal positional vertigo) 06/17/2020   Primary osteoarthritis of left knee 09/17/2019   Mixed hyperlipidemia 09/27/2018   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 05/03/2016   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   Parotid mass 11/17/2015   Carpal tunnel syndrome, bilateral 08/18/2015   Unspecified hereditary and idiopathic peripheral neuropathy 06/18/2014   Lumbar spinal stenosis  11/09/2013   ESOPHAGEAL REFLUX 04/10/2010   HYPERTENSION, PULMONARY 04/07/2010   UNSPECIFIED HEARING LOSS 08/07/2008   HYPERTENSION, BENIGN 08/07/2008   BPH (benign prostatic hyperplasia) 08/07/2008   SCIATICA 08/07/2008    Past Surgical History:  Procedure Laterality Date   CARPAL TUNNEL RELEASE Right    09/2021   COLONOSCOPY     CYSTOSCOPY  12/30/2011   Procedure: CYSTOSCOPY;  Surgeon: Reece Packer, MD;  Location: [facility];  Service: Urology;  Laterality: N/A;   EYE SURGERY Bilateral 06/18/2017   cataract sx with lens implant   HERNIA REPAIR  2013   HERNIA REPAIR Right    age  18   LUMBAR LAMINECTOMY/DECOMPRESSION MICRODISCECTOMY N/A 11/09/2013   Procedure: Lumbar Laminectomy/Decompression, Microdiscectomy Lumbar Three-Four, Four-Five, with Coflex;  Surgeon: Faythe Ghee, MD;  Location: [facility];  Service: Neurosurgery;  Laterality: N/A;  Lumbar Laminectomy/Decompression, Microdiscectomy Lumbar Three-Four, Four-Five, with Coflex   PAROTIDECTOMY Left 11/17/2015   Procedure: LEFT PAROTIDECTOMY;  Surgeon: Izora Gala, MD;  Location: [place];  Service: ENT;  Laterality: Left;   PROSTATE SURGERY  2013   SALIVARY GLAND SURGERY     TONSILLECTOMY     trigger thumb  1980   right       Home Medications    Prior to Admission medications   Medication Sig Start Date End Date Taking? Authorizing Provider  apixaban (ELIQUIS) 5 MG TABS tablet Take 1  tablet (5 mg total) by mouth 2 (two) times daily. 03/22/22   Troy Sine, MD  atorvastatin (LIPITOR) 10 MG tablet Take 1 tablet (10 mg total) by mouth daily. 03/23/22   Nafziger, Tommi Rumps, NP  levothyroxine (SYNTHROID) 50 MCG tablet Take 1 tablet (50 mcg total) by mouth every morning. 03/23/22   Nafziger, Tommi Rumps, NP  losartan (COZAAR) 25 MG tablet TAKE ONE TABLET BY MOUTH DAILY 03/23/22   Nafziger, Tommi Rumps, NP  metoprolol succinate (TOPROL-XL) 25 MG 24 hr tablet Take 25 mg by mouth daily. Take 0.5 po qd w/or imm following a  meal    [provider]  Multiple Vitamins-Minerals (PRESERVISION AREDS 2 PO) Take 1 capsule by mouth 2 (two) times daily.    [provider]    Family History Family History  Problem Relation Age of Onset   Hypertension Other    Cancer Mother        bone?   Myasthenia gravis Father    Obesity Father    Heart disease Brother     Social History Social History   Tobacco Use   Smoking status: Never   Smokeless tobacco: Never   Tobacco comments:    per patient he never smoke. 11/25/2014 /rc  Vaping Use   Vaping Use: Never used  Substance Use Topics   Alcohol use: Yes    Alcohol/week: 2.0 standard drinks of alcohol    Types: 2 Standard drinks or equivalent per week    Comment: socially- occ beer   Drug use: No     Allergies   Penicillins, Gabapentin, Lisinopril, and Penicillin g   Review of Systems Review of Systems See HPI  Physical Exam Triage Vital Signs ED Triage Vitals  Enc Vitals Group     BP 10/04/22 1408 136/88     Pulse Rate 10/04/22 1408 76     Resp 10/04/22 1408 16     Temp 10/04/22 1408 97.8 F (36.6 C)     Temp Source 10/04/22 1408 Oral     SpO2 10/04/22 1408 99 %     Weight --      Height --      Head Circumference --      Peak Flow --      Pain Score 10/04/22 1410 0     Pain Loc --      Pain Edu? --      Excl. in [place]? --    No data found.  Updated Vital Signs BP 136/88 (BP Location: Right Arm)   Pulse 76   Temp 97.8 F (36.6 C) (Oral)   Resp 16   SpO2 99%       Physical Exam Constitutional:      General: He is not in acute distress.    Appearance: He is well-developed.     Comments: Pleasant looks stated age  HENT:     Head: Normocephalic and atraumatic.      Right Ear: Tympanic membrane normal.     Left Ear: Tympanic membrane normal.     Nose: Nose normal. No congestion.     Mouth/Throat:     Mouth: Mucous membranes are moist.     Pharynx: Oropharynx is clear.  Eyes:     Conjunctiva/sclera:  Conjunctivae normal.     Pupils: Pupils are equal, round, and reactive to light.  Cardiovascular:     Rate and Rhythm: Normal rate.  Pulmonary:     Effort: Pulmonary effort is normal. No respiratory distress.  Abdominal:  General: There is no distension.     Palpations: Abdomen is soft.  Musculoskeletal:        General: Normal range of motion.     Cervical back: Normal range of motion.  Skin:    General: Skin is warm and dry.  Neurological:     Mental Status: He is alert.      UC Treatments / Results  Labs (all labs ordered are listed, but only abnormal results are displayed) Labs Reviewed - No data to display  EKG   Radiology No results found.  Procedures Procedures (including critical care time)  Medications Ordered in UC Medications - No data to display  Initial Impression / Assessment and Plan / UC Course  I have reviewed the triage vital signs and the nursing notes.  Pertinent labs & imaging results that were available during my care of the patient were reviewed by me and considered in my medical decision making (see chart for details).     Patient gives no history of trauma.  He does not wear any masks to sleep.  He did not rest his face on a surface that was irregular.  I am unable to identify why he might have this bruise.  I told him that it is superficial and well heal without additional treatment Final Clinical Impressions(s) / UC Diagnoses   Final diagnoses:  Ecchymosis     Discharge Instructions      Try warm compresses of the bruised area.  This may help resolve sooner  Talk to your doctor about your Eliquis     ED Prescriptions   None    PDMP not reviewed this encounter.   Raylene Everts, MD 10/04/22 [phone]

## 2022-10-04 NOTE — ED Triage Notes (Signed)
Pt c/o bruising type mark on face on since waking up Saturday. Denies injury. Started on eliquis a year ago.

## 2022-10-04 NOTE — Telephone Encounter (Signed)
Patient stated he is in the donut hole and requested samples of eliquis '5mg'$  twice daily. Approved by PharmD K. Alvstad. Patient advised the 2 sample boxes are being held for him. Lot #: [id number]; Exp: 7/28.

## 2022-10-04 NOTE — Telephone Encounter (Signed)
Pt c/o medication issue:  1. Name of Medication:   apixaban (ELIQUIS) 5 MG TABS tablet   2. How are you currently taking this medication (dosage and times per day)? As prescribed  3. Are you having a reaction (difficulty breathing--STAT)?   No  4. What is your medication issue?   Patient stated he is in the donut-hole and  will more of this medication to take him through the end of the year.  Patient stated he has about a week's worth of this medication left.

## 2022-10-04 NOTE — Discharge Instructions (Signed)
Try warm compresses of the bruised area.  This may help resolve sooner  Talk to your doctor about your Eliquis

## 2022-10-08 DIAGNOSIS — M792 Neuralgia and neuritis, unspecified: Secondary | ICD-10-CM | POA: Diagnosis not present

## 2022-10-08 DIAGNOSIS — L603 Nail dystrophy: Secondary | ICD-10-CM | POA: Diagnosis not present

## 2022-10-08 DIAGNOSIS — B351 Tinea unguium: Secondary | ICD-10-CM | POA: Diagnosis not present

## 2022-10-08 DIAGNOSIS — I739 Peripheral vascular disease, unspecified: Secondary | ICD-10-CM | POA: Diagnosis not present

## 2022-11-08 ENCOUNTER — Ambulatory Visit (INDEPENDENT_AMBULATORY_CARE_PROVIDER_SITE_OTHER): Payer: PPO | Admitting: Sports Medicine

## 2022-11-08 DIAGNOSIS — M1712 Unilateral primary osteoarthritis, left knee: Secondary | ICD-10-CM

## 2022-11-08 MED ORDER — ACETAMINOPHEN ER 650 MG PO TBCR: 650.0000 mg | EXTENDED_RELEASE_TABLET | Freq: Three times a day (TID) | ORAL | 3 refills | Status: DC | PRN

## 2022-11-08 NOTE — Progress Notes (Signed)
    Procedures performed today:    None.  Independent interpretation of notes and tests performed by another provider:   None.  Brief History, Exam, Impression, and Recommendations:    Primary osteoarthritis of left knee This is a very pleasant 89 year old male, he works every day, he has x-ray confirmed osteoarthritis last injected in 2020. He has done well until recently, he had a few days of increasing swelling, tightness and discomfort. By the time he made his appointment the swelling, discomfort had resolved. On exam today he has good motion, a mild to moderate effusion, but without any discomfort I am hesitant to do any injections. We cannot use NSAIDs as he is on Eliquis, he will do arthritis strength Tylenol, home physical therapy, cryotherapy and return to see me as needed.    ____________________________________________ Gwen Her. Dianah Field, M.D., ABFM., CAQSM., AME. Primary Care and Sports Medicine [place] [facility]  Adjunct Professor of [place] of [facility] of Medicine  Risk manager

## 2022-11-08 NOTE — Assessment & Plan Note (Signed)
This is a very pleasant 89 year old male, he works every day, he has x-ray confirmed osteoarthritis last injected in 2020. He has done well until recently, he had a few days of increasing swelling, tightness and discomfort. By the time he made his appointment the swelling, discomfort had resolved. On exam today he has good motion, a mild to moderate effusion, but without any discomfort I am hesitant to do any injections. We cannot use NSAIDs as he is on Eliquis, he will do arthritis strength Tylenol, home physical therapy, cryotherapy and return to see me as needed.

## 2022-11-15 ENCOUNTER — Encounter: Payer: Self-pay | Admitting: Physician Assistant

## 2022-11-15 ENCOUNTER — Ambulatory Visit (INDEPENDENT_AMBULATORY_CARE_PROVIDER_SITE_OTHER): Payer: PPO | Admitting: Physician Assistant

## 2022-11-15 VITALS — BP 123/53 | HR 75 | Ht 66.0 in | Wt 203.0 lb

## 2022-11-15 DIAGNOSIS — L03115 Cellulitis of right lower limb: Secondary | ICD-10-CM | POA: Diagnosis not present

## 2022-11-15 DIAGNOSIS — I878 Other specified disorders of veins: Secondary | ICD-10-CM | POA: Diagnosis not present

## 2022-11-15 DIAGNOSIS — D225 Melanocytic nevi of trunk: Secondary | ICD-10-CM | POA: Diagnosis not present

## 2022-11-15 DIAGNOSIS — L570 Actinic keratosis: Secondary | ICD-10-CM | POA: Diagnosis not present

## 2022-11-15 DIAGNOSIS — L820 Inflamed seborrheic keratosis: Secondary | ICD-10-CM | POA: Diagnosis not present

## 2022-11-15 DIAGNOSIS — D692 Other nonthrombocytopenic purpura: Secondary | ICD-10-CM | POA: Diagnosis not present

## 2022-11-15 DIAGNOSIS — L814 Other melanin hyperpigmentation: Secondary | ICD-10-CM | POA: Diagnosis not present

## 2022-11-15 DIAGNOSIS — L821 Other seborrheic keratosis: Secondary | ICD-10-CM | POA: Diagnosis not present

## 2022-11-15 MED ORDER — DOXYCYCLINE HYCLATE 100 MG PO TABS: 100.0000 mg | ORAL_TABLET | Freq: Two times a day (BID) | ORAL | 0 refills | Status: DC

## 2022-11-15 NOTE — Patient Instructions (Addendum)
Take wrap off in 3 days.  Start doxycycline for 10 days.

## 2022-11-15 NOTE — Progress Notes (Signed)
Acute Office Visit  Subjective:     Patient ID: Glenn Ortega, male    DOB: January 27, 1934, 89 y.o.   MRN: [id number]  Chief Complaint  Patient presents with   Joint Swelling    HPI Patient is in today for right lower leg edema, redness and pain for the last week and a half. He took his lasix and helped some with edema but not the redness and pain. He thinks one of his animals might have jumped on his leg and caused some injury. He is on eliquis. He denies any SOB or cough. He is able to lay flat. Last echo was 06/2021.   .. 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 05/03/2016   Mixed hyperlipidemia 09/27/2018   Primary osteoarthritis of left knee 09/17/2019   AF (paroxysmal atrial fibrillation) ([place]) 06/17/2020   BPPV (benign paroxysmal positional vertigo) 06/17/2020   BMI 38.0-38.9,adult 06/18/2020   Bilateral lower extremity edema 06/18/2020   Hypothyroidism 07/15/2020   Hyperglycemia 07/15/2020   Vitamin D deficiency 07/29/2020   Senile purpura ([place]) 08/14/2020   Depressed mood, not depression 08/18/2020   Acute bilateral low back pain without sciatica 11/12/2020   Right elbow pain 03/19/2021   Age-related macular degeneration 06/28/2021   Sleep apnea 06/28/2021   Hypokalemia 06/28/2021   Stenosing tenosynovitis of finger of left hand 08/16/2022   Resolved Ambulatory Problems    Diagnosis Date Noted   SEBORRHEIC KERATOSIS 10/07/2009   BURSITIS, LEFT SHOULDER 04/10/2010   VASOVAGAL SYNCOPE  04/10/2010   Right shoulder injury 05/03/2016   Carpal tunnel syndrome on left 05/31/2016   TIA (transient ischemic attack)    Essential hypertension 08/18/2020   Hematoma 03/19/2021   Cellulitis of right knee 03/22/2021   Leg hematoma, right, initial encounter 03/24/2021   Fall at home, initial encounter 06/28/2021   Cough 06/28/2021   Past Medical History:  Diagnosis Date   Atrial fibrillation (HCC)    Back pain    GERD (gastroesophageal reflux disease)    Hyperlipidemia    Hypertension    Spinal stenosis    Thyroid trouble          ROS See HPI.      Objective:    BP (!) 123/53   Pulse 75   Ht '5\' 6"'$  (1.676 m)   Wt 203 lb (92.1 kg)   SpO2 100%   BMI 32.77 kg/m  BP Readings from Last 3 Encounters:  11/15/22 (!) 123/53  10/04/22 136/88  09/06/22 (!) 129/58   Wt Readings from Last 3 Encounters:  11/15/22 203 lb (92.1 kg)  09/27/22 201 lb (91.2 kg)  09/06/22 201 lb 1.9 oz (91.2 kg)      Physical Exam Constitutional:      Appearance: Normal appearance.  Cardiovascular:     Rate and Rhythm: Normal rate and regular rhythm.     Pulses: Normal pulses.  Pulmonary:     Effort: Pulmonary effort is normal.     Breath sounds: Normal breath sounds.  Musculoskeletal:     Right lower leg: Edema  present.     Left lower leg: Edema present.     Comments: 1+ pitting edema bilateral lower extremity Right leg with lateral erythema, warmth, tenderness about 4cm by 5cm with some central purple color in the center.   Neurological:     Mental Status: He is alert.  Psychiatric:        Mood and Affect: Mood normal.          Assessment & Plan:  Marland KitchenMarland KitchenReinhold was seen today for joint swelling.  Diagnoses and all orders for this visit:  Cellulitis of right lower extremity -     doxycycline (VIBRA-TABS) 100 MG tablet; Take 1 tablet (100 mg total) by mouth 2 (two) times daily.  Venous stasis of both lower extremities   No other signs of fluid overload Echo done  06/2021 with good EF Appears to be some cellulitis start doxycycline Unna boot placed on right leg for next 3 days Follow up in 1 week for recheck or sooner if needed    Iran Planas, PA-C

## 2022-11-19 ENCOUNTER — Telehealth: Payer: Self-pay | Admitting: *Deleted

## 2022-11-19 NOTE — Telephone Encounter (Unsigned)
Letter from Dr. Madilyn Fireman stating the following for him to be able to get help from the [place] to purchase a home:  he stated that the letter must state that his son will live with him to be a care giver and he will need a walk-in shower due to him being limited by his back and knee issues. (Lumbar spinal stenosis, and OA of L knee).   He has an appt on Monday with Jade and would like to pick up the letter that day.

## 2022-11-22 ENCOUNTER — Encounter: Payer: Self-pay | Admitting: Physician Assistant

## 2022-11-22 ENCOUNTER — Ambulatory Visit (INDEPENDENT_AMBULATORY_CARE_PROVIDER_SITE_OTHER): Payer: PPO | Admitting: Physician Assistant

## 2022-11-22 VITALS — BP 132/56 | HR 67 | Ht 66.0 in | Wt 206.0 lb

## 2022-11-22 DIAGNOSIS — I878 Other specified disorders of veins: Secondary | ICD-10-CM

## 2022-11-22 DIAGNOSIS — L03115 Cellulitis of right lower limb: Secondary | ICD-10-CM | POA: Diagnosis not present

## 2022-11-22 NOTE — Progress Notes (Signed)
Acute Office Visit  Subjective:     Patient ID: Glenn Ortega, male    DOB: 1934/09/29, 89 y.o.   MRN: [id number]  Chief Complaint  Patient presents with   Follow-up    HPI Patient is in today for right leg edema and cellulitis. He was placed in unna boot. He took it off at 3 day mark. His swelling is much better. Denies any pain in right leg. He is taking doxycycline. Denies any SOB. He takes lasix as needed.   He request a letter that he cannot live alone in order for his son to live with him. He request that it also state he needs a walk in shower.   [email or web address]   .Marland Kitchen 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 05/03/2016   Mixed hyperlipidemia 09/27/2018   Primary osteoarthritis of left knee 09/17/2019   AF (paroxysmal atrial fibrillation) ([place]) 06/17/2020   BPPV (benign paroxysmal positional vertigo) 06/17/2020   BMI 38.0-38.9,adult 06/18/2020   Bilateral lower extremity edema 06/18/2020   Hypothyroidism 07/15/2020   Hyperglycemia 07/15/2020   Vitamin D deficiency 07/29/2020   Senile purpura ([name]) 08/14/2020   Depressed mood, not depression 08/18/2020   Acute bilateral low back pain without sciatica 11/12/2020   Right elbow pain 03/19/2021   Age-related macular degeneration 06/28/2021   Sleep apnea 06/28/2021   Hypokalemia 06/28/2021   Stenosing tenosynovitis of finger of left hand 08/16/2022   Resolved Ambulatory Problems    Diagnosis Date Noted   SEBORRHEIC KERATOSIS 10/07/2009    BURSITIS, LEFT SHOULDER 04/10/2010   VASOVAGAL SYNCOPE 04/10/2010   Right shoulder injury 05/03/2016   Carpal tunnel syndrome on left 05/31/2016   TIA (transient ischemic attack)    Essential hypertension 08/18/2020   Hematoma 03/19/2021   Cellulitis of right knee 03/22/2021   Leg hematoma, right, initial encounter 03/24/2021   Fall at home, initial encounter 06/28/2021   Cough 06/28/2021   Past Medical History:  Diagnosis Date   Atrial fibrillation (HCC)    Back pain    GERD (gastroesophageal reflux disease)    Hyperlipidemia    Hypertension    Spinal stenosis    Thyroid trouble      ROS See HPI.      Objective:    BP (!) 132/56   Pulse 67   Ht '5\' 6"'$  (1.676 m)   Wt 206 lb (93.4 kg)   SpO2 100%   BMI 33.25 kg/m  BP Readings from Last 3 Encounters:  11/22/22 (!) 132/56  11/15/22 (!) 123/53  10/04/22 136/88   Wt Readings from Last 3 Encounters:  11/22/22 206 lb (93.4 kg)  11/15/22 203 lb (92.1 kg)  09/27/22 201 lb (91.2 kg)      Physical Exam Vitals reviewed.  Constitutional:      Appearance: Normal appearance.  HENT:     Head: Normocephalic.  Cardiovascular:     Rate and Rhythm: Normal rate.  Pulmonary:     Effort: Pulmonary effort is normal.  Musculoskeletal:     Right lower leg: Edema present.  Left lower leg: Edema present.     Comments: Very scant pitting edema Some purplish tint to lower legs, bilaterally No warmth or tenderness to palpation  Neurological:     General: No focal deficit present.     Mental Status: He is alert and oriented to person, place, and time.          Assessment & Plan:  Marland KitchenMarland KitchenBenton was seen today for follow-up.  Diagnoses and all orders for this visit:  Cellulitis of right lower extremity  Venous stasis of both lower extremities   Scant edema and redness improved Discussed compression stockings  Keep feet elevated Lasix as needed Finish doxycycline Follow up as needed  Request letter than he  cannot live alone. Will defer to Dr. Madilyn Fireman for letter.   Iran Planas, PA-C

## 2022-11-29 ENCOUNTER — Ambulatory Visit (INDEPENDENT_AMBULATORY_CARE_PROVIDER_SITE_OTHER): Payer: PPO | Admitting: Sports Medicine

## 2022-11-29 ENCOUNTER — Ambulatory Visit (INDEPENDENT_AMBULATORY_CARE_PROVIDER_SITE_OTHER): Payer: PPO

## 2022-11-29 DIAGNOSIS — M1812 Unilateral primary osteoarthritis of first carpometacarpal joint, left hand: Secondary | ICD-10-CM

## 2022-11-29 DIAGNOSIS — M170 Bilateral primary osteoarthritis of knee: Secondary | ICD-10-CM

## 2022-11-29 MED ORDER — TRIAMCINOLONE ACETONIDE 40 MG/ML IJ SUSP
40.0000 mg | Freq: Once | INTRAMUSCULAR | Status: AC
  Administered 2022-11-29: 40 mg via INTRAMUSCULAR

## 2022-11-29 MED ORDER — TRIAMCINOLONE ACETONIDE 40 MG/ML IJ SUSP
80.0000 mg | Freq: Once | INTRAMUSCULAR | Status: AC
  Administered 2022-11-29: 80 mg via INTRAMUSCULAR

## 2022-11-29 NOTE — Assessment & Plan Note (Signed)
Not responsive to Tylenol, injected today, return to see me in 6 weeks. No numbness and tingling today so carpal tunnel continues to be resolved.

## 2022-11-29 NOTE — Addendum Note (Signed)
Addended by: Tarri Glenn A on: 11/29/2022 04:58 PM   Modules accepted: Orders

## 2022-11-29 NOTE — Progress Notes (Signed)
    Procedures performed today:    Procedure: Real-time Ultrasound Guided injection of the left knee Device: Samsung HS60  Verbal informed consent obtained.  Time-out conducted.  Noted no overlying erythema, induration, or other signs of local infection.  Skin prepped in a sterile fashion.  Local anesthesia: Topical Ethyl chloride.  With sterile technique and under real time ultrasound guidance: Trace effusion noted, 1 cc Kenalog 40, 2 cc lidocaine, 2 cc bupivacaine injected easily Completed without difficulty  Advised to call if fevers/chills, erythema, induration, drainage, or persistent bleeding.  Images permanently stored and available for review in PACS.  Impression: Technically successful ultrasound guided injection.  Procedure: Real-time Ultrasound Guided injection of the right knee Device: Samsung HS60  Verbal informed consent obtained.  Time-out conducted.  Noted no overlying erythema, induration, or other signs of local infection.  Skin prepped in a sterile fashion.  Local anesthesia: Topical Ethyl chloride.  With sterile technique and under real time ultrasound guidance: Trace effusion noted, 1 cc Kenalog 40, 2 cc lidocaine, 2 cc bupivacaine injected easily Completed without difficulty  Advised to call if fevers/chills, erythema, induration, drainage, or persistent bleeding.  Images permanently stored and available for review in PACS.  Impression: Technically successful ultrasound guided injection.  Procedure: Real-time Ultrasound Guided injection of the left first [facility] Device: Samsung HS60  Verbal informed consent obtained.  Time-out conducted.  Noted no overlying erythema, induration, or other signs of local infection.  Skin prepped in a sterile fashion.  Local anesthesia: Topical Ethyl chloride.  With sterile technique and under real time ultrasound guidance: Noted arthritic joint, 1/2 cc lidocaine, 1/2 cc kenalog 40 injected easily. Completed without difficulty   Advised to call if fevers/chills, erythema, induration, drainage, or persistent bleeding.  Images permanently stored and available for review in PACS.  Impression: Technically successful ultrasound guided injection.  Independent interpretation of notes and tests performed by another provider:   None.  Brief History, Exam, Impression, and Recommendations:    Primary osteoarthritis of both knees Increasing pain, x-ray confirmed osteoarthritis last injected in 2020. We added some Tylenol and home physical therapy at the last visit, he has not responded so we will do bilateral knee joint injections today.  Primary osteoarthritis of first carpometacarpal joint of left hand Not responsive to Tylenol, injected today, return to see me in 6 weeks. No numbness and tingling today so carpal tunnel continues to be resolved.    ____________________________________________ Gwen Her. Dianah Field, M.D., ABFM., CAQSM., AME. Primary Care and Sports Medicine [facility]  Adjunct Professor of [place] of [facility] [employer] [facility] of Medicine  Risk manager

## 2022-11-29 NOTE — Assessment & Plan Note (Signed)
Increasing pain, x-ray confirmed osteoarthritis last injected in 2020. We added some Tylenol and home physical therapy at the last visit, he has not responded so we will do bilateral knee joint injections today.

## 2022-12-01 ENCOUNTER — Telehealth: Payer: Self-pay

## 2022-12-01 NOTE — Telephone Encounter (Signed)
Patient left VM and states knees are doing wonderful but he was wondering about a brace cause he feels like his knee gives out sometime please advise.

## 2022-12-02 NOTE — Telephone Encounter (Signed)
Yes a brace certainly could be effective, a medial unloader would be the one that is recommended.

## 2022-12-03 ENCOUNTER — Other Ambulatory Visit: Payer: Self-pay | Admitting: Adult Health

## 2022-12-03 NOTE — Telephone Encounter (Signed)
Dr. Mcneil Sober message was given to pt recommending a "medial unloader" pt does not know what that mean. Please advise.

## 2022-12-03 NOTE — Telephone Encounter (Signed)
We spoke to patient and we are getting him set up for nurse visit to get braced put on.

## 2022-12-06 ENCOUNTER — Ambulatory Visit (INDEPENDENT_AMBULATORY_CARE_PROVIDER_SITE_OTHER): Payer: PPO | Admitting: Sports Medicine

## 2022-12-06 VITALS — BP 122/55 | HR 64 | Ht 66.0 in | Wt 203.0 lb

## 2022-12-06 DIAGNOSIS — M170 Bilateral primary osteoarthritis of knee: Secondary | ICD-10-CM | POA: Diagnosis not present

## 2022-12-06 NOTE — Progress Notes (Unsigned)
   Subjective:    Patient ID: Glenn Ortega, male    DOB: Apr 21, 1934, 89 y.o.   MRN: [id number]  HPI Pt here for placement of reaction brace,left knee.   Review of Systems     Objective:   Physical Exam        Assessment & Plan:  Pt tolerated placement well, pt advised to follow up at next scheduled appointment 01/10/2023. Pt gave verbal consent otp to sign for billing of knee brace.

## 2022-12-07 NOTE — Progress Notes (Signed)
   I was present for all necessary parts of this visit.

## 2023-01-03 DIAGNOSIS — M792 Neuralgia and neuritis, unspecified: Secondary | ICD-10-CM | POA: Diagnosis not present

## 2023-01-03 DIAGNOSIS — L603 Nail dystrophy: Secondary | ICD-10-CM | POA: Diagnosis not present

## 2023-01-03 DIAGNOSIS — B351 Tinea unguium: Secondary | ICD-10-CM | POA: Diagnosis not present

## 2023-01-03 DIAGNOSIS — I739 Peripheral vascular disease, unspecified: Secondary | ICD-10-CM | POA: Diagnosis not present

## 2023-01-10 ENCOUNTER — Ambulatory Visit (INDEPENDENT_AMBULATORY_CARE_PROVIDER_SITE_OTHER): Payer: PPO | Admitting: Sports Medicine

## 2023-01-10 DIAGNOSIS — M170 Bilateral primary osteoarthritis of knee: Secondary | ICD-10-CM

## 2023-01-10 DIAGNOSIS — M1812 Unilateral primary osteoarthritis of first carpometacarpal joint, left hand: Secondary | ICD-10-CM

## 2023-01-10 NOTE — Assessment & Plan Note (Signed)
Bilateral knee pain doing well after injection at the last visit.

## 2023-01-10 NOTE — Progress Notes (Signed)
    Procedures performed today:    None.  Independent interpretation of notes and tests performed by another provider:   None.  Brief History, Exam, Impression, and Recommendations:    Primary osteoarthritis of both knees Bilateral knee pain doing well after injection at the last visit.  Primary osteoarthritis of first carpometacarpal joint of left hand Last time we injected his joint, he is doing really well, return as needed.    ____________________________________________ Gwen Her. Dianah Field, M.D., ABFM., CAQSM., AME. Primary Care and Sports Medicine [facility]  Adjunct Professor of [place] of [employer] of Medicine  Risk manager

## 2023-01-10 NOTE — Assessment & Plan Note (Signed)
Last time we injected his joint, he is doing really well, return as needed.

## 2023-01-17 ENCOUNTER — Other Ambulatory Visit: Payer: Self-pay | Admitting: Cardiovascular Disease

## 2023-01-17 DIAGNOSIS — I4891 Unspecified atrial fibrillation: Secondary | ICD-10-CM

## 2023-01-17 NOTE — Telephone Encounter (Signed)
Prescription refill request for Eliquis received. Indication:a fib Last office visit: Overdue.  Scr: 1.18 epic 09/06/22 Age: 89 Weight: 92kg  One month supply sent to pharmacy and message sent to scheduling.

## 2023-01-31 ENCOUNTER — Encounter: Payer: Self-pay | Admitting: *Deleted

## 2023-02-11 ENCOUNTER — Telehealth: Payer: Self-pay | Admitting: Family Medicine

## 2023-02-11 NOTE — Telephone Encounter (Signed)
Contacted [name] to schedule their annual wellness visit. Patient declined to schedule AWV at this time.  Glenn Ortega AWV direct phone # [phone]   Spoke with patient   he stated he has changed pcp

## 2023-02-11 NOTE — Progress Notes (Unsigned)
Cardiology Office Note:    Date:  02/14/2023   ID:  [name], DOB [birth year], MRN [id number]  PCP:  Agapito Games, MD   [facility] Providers Cardiologist:  Nicki Guadalajara, MD  Referring MD: Agapito Games, *   Chief Complaint  Patient presents with   Follow-up    Yearly follow up, Afib    History of Present Illness:    Glenn Ortega is a 89 y.o. male with a hx of HTN, hypothyroidism, HLD, atrial fibrillation. Patient was previously followed by Dr. Eldridge Dace, but is now followed by Dr. Tresa Endo. He presents today for his annual follow up.  Patient was diagnosed with atrial fibrillation in 06/2020. Echocardiogram from 07/16/20 showed EF 55-60%, no regional wall motion abnormalities, normal RV systolic function. He was referred to cardiology and seen by Dr. Eldridge Dace on 07/24/20 and he was treated with eliquis and losartan. Later, patient was admitted to the hospital in 06/2021 after and episode of urinary incontinence and mechanical fall. Found to have pneumonia. Underwent echocardiogram on 06/28/21  that showed EF 55-60%, no regional wall motion abnormalities, mildly reduced RV systolic function, moderate tricuspid valve regurgitation.  He was later seen by Dr. Tresa Endo in 08/2021 for preoperative evaluation prior to carpal tunnel surgery. He remained in rate-controlled atrial fibrillation at that time  Patient was last seen by cardiology on 01/04/22. AT that time, patient was taking lasix PRN for leg edema. EKG showed atrial fibrillation with HR 66 BPM. He remained on losartan 25 mg daily, metoprolol 12.5 mg daily, lipitor 10 mg daily, and eliquis 5 mg BID   Patient reports that he has been doing very well from a cardiovascular standpoint since last being seen.  He continues to work for [employer], and drives a parts truck.  He frequently lifts parts that are over 100 pounds.  Denies chest pain on exertion.  Denies dyspnea on exertion, orthopnea, syncope, near syncope, dizziness,  lightheadedness.  He has a known history of A-fib, reports that he has always been unable to tell when he is in A-fib and does not have any symptoms.  He is on Eliquis which causes bruising.  Denies nosebleeds, excessive bleeding, blood in urine/stools.  He takes Lasix for weight gain, usually takes about twice a month.  Past Medical History:  Diagnosis Date   Atrial fibrillation (HCC)    Back pain    GERD (gastroesophageal reflux disease)    Hyperlipidemia    Hypertension    EKG and chest x ray 8/12 EPIC/states had stress test 2 yrs ago- doesnt remember where- not in EPIC   Spinal stenosis    lumbar   Thyroid trouble     Past Surgical History:  Procedure Laterality Date   CARPAL TUNNEL RELEASE Right    09/2021   COLONOSCOPY     CYSTOSCOPY  12/30/2011   Procedure: CYSTOSCOPY;  Surgeon: Martina Sinner, MD;  Location: [facility];  Service: Urology;  Laterality: N/A;   EYE SURGERY Bilateral 06/18/2017   cataract sx with lens implant   HERNIA REPAIR  2013   HERNIA REPAIR Right    age  65   LUMBAR LAMINECTOMY/DECOMPRESSION MICRODISCECTOMY N/A 11/09/2013   Procedure: Lumbar Laminectomy/Decompression, Microdiscectomy Lumbar Three-Four, Four-Five, with Coflex;  Surgeon: Reinaldo Meeker, MD;  Location: [facility];  Service: Neurosurgery;  Laterality: N/A;  Lumbar Laminectomy/Decompression, Microdiscectomy Lumbar Three-Four, Four-Five, with Coflex   PAROTIDECTOMY Left 11/17/2015   Procedure: LEFT PAROTIDECTOMY;  Surgeon: Serena Colonel, MD;  Location:  [facility];  Service: ENT;  Laterality: Left;   PROSTATE SURGERY  2013   SALIVARY GLAND SURGERY     TONSILLECTOMY     trigger thumb  1980   right    Current Medications: Current Meds  Medication Sig   acetaminophen (TYLENOL) 650 MG CR tablet Take 1 tablet (650 mg total) by mouth every 8 (eight) hours as needed for pain.   atorvastatin (LIPITOR) 10 MG tablet Take 1 tablet (10 mg total) by mouth daily.   levothyroxine  (SYNTHROID) 50 MCG tablet Take 1 tablet by mouth every morning.   losartan (COZAAR) 25 MG tablet TAKE ONE TABLET BY MOUTH DAILY   metoprolol succinate (TOPROL-XL) 25 MG 24 hr tablet Take 1/2 tablet by mouth daily. Take with or immediately following a meal.   Multiple Vitamins-Minerals (PRESERVISION AREDS 2 PO) Take 1 capsule by mouth 2 (two) times daily.   [DISCONTINUED] apixaban (ELIQUIS) 5 MG TABS tablet Take 1 tablet by mouth twice daily.   [DISCONTINUED] doxycycline (VIBRA-TABS) 100 MG tablet Take 1 tablet (100 mg total) by mouth 2 (two) times daily.   [DISCONTINUED] FUROSEMIDE PO Take by mouth daily as needed.     Allergies:   Penicillins, Gabapentin, Lisinopril, and Penicillin g   Social History   Socioeconomic History   Marital status: Widowed    Spouse name: Lupita Leash   Number of children: Not on file   Years of education: Not on file   Highest education level: Not on file  Occupational History   Occupation: Retired  Tobacco Use   Smoking status: Never   Smokeless tobacco: Never   Tobacco comments:    per patient he never smoke. 11/25/2014 /rc  Vaping Use   Vaping Use: Never used  Substance and Sexual Activity   Alcohol use: Yes    Alcohol/week: 2.0 standard drinks of alcohol    Types: 2 Standard drinks or equivalent per week    Comment: socially- occ beer   Drug use: No   Sexual activity: Not Currently  Other Topics Concern   Not on file  Social History Narrative   Works 3 nights a week at [employer].    Previously worked as a Production designer, theatre/television/film for [employer].   2 caffeinated drinks per day. Does not exercise he says secondary to back problems.  Lives in a one story home.     Has 2 children.  Education: some college. He is an is in [facility] from [place]   Wife passed from cancer in 2021.    Social Determinants of Health   Financial Resource Strain: Low Risk  (02/22/2022)   Overall Financial Resource Strain (CARDIA)    Difficulty of Paying Living Expenses:  Not hard at all  Food Insecurity: No Food Insecurity (02/22/2022)   Hunger Vital Sign    Worried About Running Out of Food in the Last Year: Never true    Ran Out of Food in the Last Year: Never true  Transportation Needs: No Transportation Needs (02/22/2022)   PRAPARE - Administrator, Civil Service (Medical): No    Lack of Transportation (Non-Medical): No  Physical Activity: Sufficiently Active (02/22/2022)   Exercise Vital Sign    Days of Exercise per Week: 2 days    Minutes of Exercise per Session: 90 min  Stress: No Stress Concern Present (02/22/2022)   [employer] of Occupational Health - Occupational Stress Questionnaire    Feeling of Stress : Not at all  Social Connections: Moderately Integrated (02/17/2021)   Social Connection and Isolation Panel [NHANES]    Frequency of Communication with Friends and Family: More than three times a week    Frequency of Social Gatherings with Friends and Family: More than three times a week    Attends Religious Services: 1 to 4 times per year    Active Member of [employer] or Organizations: No    Attends Banker Meetings: 1 to 4 times per year    Marital Status: Widowed     Family History: The patient's family history includes Cancer in his mother; Heart disease in his brother; Hypertension in an other family member; Myasthenia gravis in his father; Obesity in his father.  ROS:   Please see the history of present illness.     All other systems reviewed and are negative.  EKGs/Labs/Other Studies Reviewed:    The following studies were reviewed today: Cardiac Studies & Procedures       ECHOCARDIOGRAM  ECHOCARDIOGRAM COMPLETE BUBBLE STUDY 06/28/2021  Narrative ECHOCARDIOGRAM REPORT    Patient Name:   REVANTH NEIDIG Date of Exam: 06/28/2021 Medical Rec #:  [id number]      Height:       66.0 in Accession #:    [id number]     Weight:       200.0 lb Date of Birth:  07-06-1934       BSA:          2.000 m Patient Age:     87 years       BP:           106/58 mmHg Patient Gender: M              HR:           79 bpm. Exam Location:  Inpatient  Procedure: 2D Echo, Intracardiac Opacification Agent, Color Doppler and Cardiac Doppler  Indications:    Stroke 434.91 / I63.9  History:        Patient has prior history of Echocardiogram examinations, most recent 07/16/2020. Arrythmias:Atrial Fibrillation; Risk Factors:Hypertension and Dyslipidemia. GERD. COVID.  Sonographer:    Leta Jungling RDCS Referring Phys: [id number] Deno Lunger [facility]   Sonographer Comments: Bubble study attempted multiple times, IV is located in the left radial vein. IMPRESSIONS   1. Saline microcavitation study with no clear shunt but suboptimal and PFO cannot be excluded. 2. Left ventricular ejection fraction, by estimation, is 55 to 60%. The left ventricle has normal function. The left ventricle has no regional wall motion abnormalities. Left ventricular diastolic function could not be evaluated. 3. Right ventricular systolic function is mildly reduced. The right ventricular size is mildly enlarged. There is normal pulmonary artery systolic pressure. 4. Left atrial size was mildly dilated. 5. Right atrial size was moderately dilated. 6. The mitral valve is normal in structure. Trivial mitral valve regurgitation. No evidence of mitral stenosis. 7. Tricuspid valve regurgitation is moderate. 8. The aortic valve is tricuspid. Aortic valve regurgitation is not visualized. Mild aortic valve sclerosis is present, with no evidence of aortic valve stenosis. 9. The inferior vena cava is normal in size with greater than 50% respiratory variability, suggesting right atrial pressure of 3 mmHg.  FINDINGS Left Ventricle: Left ventricular ejection fraction, by estimation, is 55 to 60%. The left ventricle has normal function. The left ventricle has no regional wall motion abnormalities. The left ventricular internal cavity size was normal in size. There  is no left ventricular hypertrophy. Left ventricular diastolic  function could not be evaluated due to atrial fibrillation. Left ventricular diastolic function could not be evaluated.  Right Ventricle: The right ventricular size is mildly enlarged. Right ventricular systolic function is mildly reduced. There is normal pulmonary artery systolic pressure. The tricuspid regurgitant velocity is 2.73 m/s, and with an assumed right atrial pressure of 3 mmHg, the estimated right ventricular systolic pressure is 32.8 mmHg.  Left Atrium: Left atrial size was mildly dilated.  Right Atrium: Right atrial size was moderately dilated.  Pericardium: There is no evidence of pericardial effusion.  Mitral Valve: The mitral valve is normal in structure. Trivial mitral valve regurgitation. No evidence of mitral valve stenosis.  Tricuspid Valve: The tricuspid valve is normal in structure. Tricuspid valve regurgitation is moderate . No evidence of tricuspid stenosis.  Aortic Valve: The aortic valve is tricuspid. Aortic valve regurgitation is not visualized. Mild aortic valve sclerosis is present, with no evidence of aortic valve stenosis. Aortic valve mean gradient measures 3.3 mmHg. Aortic valve peak gradient measures 6.2 mmHg. Aortic valve area, by VTI measures 1.65 cm.  Pulmonic Valve: The pulmonic valve was not well visualized. Pulmonic valve regurgitation is not visualized. No evidence of pulmonic stenosis.  Aorta: The aortic root is normal in size and structure.  Venous: The inferior vena cava is normal in size with greater than 50% respiratory variability, suggesting right atrial pressure of 3 mmHg.  IAS/Shunts: No atrial level shunt detected by color flow Doppler.  Additional Comments: Saline microcavitation study with no clear shunt but suboptimal and PFO cannot be excluded.   LEFT VENTRICLE PLAX 2D LVIDd:         4.00 cm LVIDs:         3.10 cm LV PW:         1.00 cm LV IVS:        1.20  cm LVOT diam:     1.80 cm  3D Volume EF: LV SV:         35       3D EF:        45 % LV SV Index:   18       LV EDV:       97 ml LVOT Area:     2.54 cm LV ESV:       53 ml LV SV:        44 ml  RIGHT VENTRICLE RV S prime:     11.40 cm/s TAPSE (M-mode): 1.5 cm  LEFT ATRIUM             Index       RIGHT ATRIUM           Index LA diam:        4.20 cm 2.10 cm/m  RA Area:     23.40 cm LA Vol (A2C):   61.9 ml 30.95 ml/m RA Volume:   70.50 ml  35.25 ml/m LA Vol (A4C):   61.4 ml 30.70 ml/m LA Biplane Vol: 63.6 ml 31.80 ml/m AORTIC VALVE AV Area (Vmax):    1.64 cm AV Area (Vmean):   1.64 cm AV Area (VTI):     1.65 cm AV Vmax:           124.67 cm/s AV Vmean:          89.200 cm/s AV VTI:            0.214 m AV Peak Grad:      6.2 mmHg AV Mean Grad:  3.3 mmHg LVOT Vmax:         80.40 cm/s LVOT Vmean:        57.600 cm/s LVOT VTI:          0.138 m LVOT/AV VTI ratio: 0.65  AORTA Ao Root diam: 3.30 cm Ao Asc diam:  3.20 cm  MITRAL VALVE                TRICUSPID VALVE MV Area (PHT): 4.26 cm     TR Peak grad:   29.8 mmHg MV Decel Time: 178 msec     TR Vmax:        273.00 cm/s MV E velocity: 106.00 cm/s SHUNTS Systemic VTI:  0.14 m Systemic Diam: 1.80 cm  Olga Millers MD Electronically signed by Olga Millers MD Signature Date/Time: 06/28/2021/12:36:45 PM    Final              EKG:  EKG is ordered today.  The ekg ordered today demonstrates atrial fibrillation, HR 65 BPM. No changes compared to EKG from 01/04/22  Recent Labs: 03/05/2022: ALT 10; Hemoglobin 15.4; Platelets 203; TSH 4.26 09/06/2022: BUN 23; Creat 1.18; Potassium 4.2; Sodium 143  Recent Lipid Panel    Component Value Date/Time   CHOL 176 03/05/2022 1549   CHOL 188 07/15/2020 0848   TRIG 76 03/05/2022 1549   HDL 67 03/05/2022 1549   HDL 87 07/15/2020 0848   CHOLHDL 2.6 03/05/2022 1549   VLDL 10 06/28/2021 0622   LDLCALC 92 03/05/2022 1549     Risk Assessment/Calculations:    CHA2DS2-VASc  Score = 3   This indicates a 3.2% annual risk of stroke. The patient's score is based upon: CHF History: 0 HTN History: 1 Diabetes History: 0 Stroke History: 0 Vascular Disease History: 0 Age Score: 2 Gender Score: 0         Physical Exam:    VS:  BP 130/74 (BP Location: Left Arm, Patient Position: Sitting, Cuff Size: Normal)   Pulse 65   Ht 5\' 6"  (1.676 m)   Wt 204 lb 3.2 oz (92.6 kg)   SpO2 96%   BMI 32.96 kg/m     Wt Readings from Last 3 Encounters:  02/14/23 204 lb 3.2 oz (92.6 kg)  12/06/22 203 lb (92.1 kg)  11/22/22 206 lb (93.4 kg)     GEN:  Well nourished, well developed in no acute distress. Sitting comfortably on the exam table HEENT: Normal NECK: No JVD CARDIAC: Irregular rate and rhythm. Grade 2/6 systolic murmur. Radial pulses 2+ bilaterally  RESPIRATORY:  Crackles in bilateral lung bases. Normal work of breathing on room air  ABDOMEN: Soft, non-tender, non-distended MUSCULOSKELETAL:  1+ edema in BLE. No deformity  SKIN: Warm and dry NEUROLOGIC:  Alert and oriented x 3 PSYCHIATRIC:  Normal affect   ASSESSMENT:    1. Tricuspid valve insufficiency, unspecified etiology   2. Atrial fibrillation, unspecified type (HCC)   3. Essential hypertension   4. Hyperlipidemia, unspecified hyperlipidemia type   5. Cough in adult   6. Ankle edema    PLAN:    In order of problems listed above:  Permanent Atrial Fibrillation - EKG today shows atrial fibrillation with HR 65 BPM. Patient is asymptomatic   - Continue metoprolol succinate 12.5 mg daily  - Continue eliquis 5 mg BID. This is the correct dose for him. Denies significant bleeding on eliquis, but does bruise easily  - Ordered CBC  - Patient does not get paid for 2 weeks and  is concerned that he will not be able to afford his eliquis until then. Provided samples to last until he gets paid and can pick up his prescription   HTN  - BP well controlled on current medications. No dizziness, syncope, near  syncope.   - Continue losartan 25 mg daily and metoprolol succinate 12.5 mg daily  - Ordered BMP to assess renal function and electrolytes on losartan   HLD  - Lipid panel from 02/2022 showed LD 92, HDL 67, triglycerides 76, total cholesterol 176  - Continue Lipitor 10 mg daily   Moderate Tricuspid Valve Regurgitation  - Noted on echocardiogram from 06/2021. He does have a murmur on exam  - Ordered echocardiogram for monitoring   Cough  Ankle Edema  - Patient complains of recent dry cough. Denies shortness of breath. He has crackles in lung bases and ankle edema on exam  - Most recent echo from 06/2021 showed EF 55-60%, mildly reduced RV systolic function  - Ordered BNP  - Ordered echocardiogram as above  - Patient has varicose veins on exam. Encouraged him to wear compression stockings, elevate his feet, and follow a low sodium diet   Hypothyroidism  - Managed by PCP, on synthroid 50 mcg daily   Medication Adjustments/Labs and Tests Ordered: Current medicines are reviewed at length with the patient today.  Concerns regarding medicines are outlined above.  Orders Placed This Encounter  Procedures   CBC   Basic Metabolic Panel (BMET)   B Nat Peptide   EKG 12-Lead   ECHOCARDIOGRAM COMPLETE   Meds ordered this encounter  Medications   apixaban (ELIQUIS) 5 MG TABS tablet    Sig: Take 1 tablet (5 mg total) by mouth 2 (two) times daily.    Dispense:  60 tablet    Refill:  0   furosemide (LASIX) 40 MG tablet    Sig: Take 1 tablet (40 mg total) by mouth daily as needed.    Dispense:  90 tablet    Refill:  3    Patient Instructions  Medication Instructions:  No Changes *If you need a refill on your cardiac medications before your next appointment, please call your pharmacy*   Lab Work: Your physician recommends that you have lab work today: CBC, BMP, BNP   If you have labs (blood work) drawn today and your tests are completely normal, you will receive your results only  by: MyChart Message (if you have MyChart) OR A paper copy in the mail If you have any lab test that is abnormal or we need to change your treatment, we will call you to review the results.   Testing/Procedures: Your physician has requested that you have an echocardiogram. Echocardiography is a painless test that uses sound waves to create images of your heart. It provides your doctor with information about the size and shape of your heart and how well your heart's chambers and valves are working. This procedure takes approximately one hour. There are no restrictions for this procedure. Please do NOT wear cologne, perfume, aftershave, or lotions (deodorant is allowed). Please arrive 15 minutes prior to your appointment time.    Follow-Up: At [facility], you and your health needs are our priority.  As part of our continuing mission to provide you with exceptional heart care, we have created designated Provider Care Teams.  These Care Teams include your primary Cardiologist (physician) and Advanced Practice Providers (APPs -  Physician Assistants and Nurse Practitioners) who all work together to provide  you with the care you need, when you need it.   Your next appointment:   6 month(s)  Provider:   Robet Leu, PA-C         Signed, Jonita Albee, PA-C  02/14/2023 11:28 AM    [facility]

## 2023-02-14 ENCOUNTER — Ambulatory Visit: Payer: PPO | Attending: General Practice | Admitting: Cardiology

## 2023-02-14 ENCOUNTER — Encounter: Payer: Self-pay | Admitting: General Practice

## 2023-02-14 VITALS — BP 130/74 | HR 65 | Ht 66.0 in | Wt 204.2 lb

## 2023-02-14 DIAGNOSIS — R059 Cough, unspecified: Secondary | ICD-10-CM

## 2023-02-14 DIAGNOSIS — I10 Essential (primary) hypertension: Secondary | ICD-10-CM | POA: Diagnosis not present

## 2023-02-14 DIAGNOSIS — M25473 Effusion, unspecified ankle: Secondary | ICD-10-CM

## 2023-02-14 DIAGNOSIS — E785 Hyperlipidemia, unspecified: Secondary | ICD-10-CM

## 2023-02-14 DIAGNOSIS — I4891 Unspecified atrial fibrillation: Secondary | ICD-10-CM

## 2023-02-14 DIAGNOSIS — I071 Rheumatic tricuspid insufficiency: Secondary | ICD-10-CM | POA: Diagnosis not present

## 2023-02-14 MED ORDER — APIXABAN 5 MG PO TABS: 5.0000 mg | ORAL_TABLET | Freq: Two times a day (BID) | ORAL | 0 refills | Status: DC

## 2023-02-14 MED ORDER — FUROSEMIDE 40 MG PO TABS: 40.0000 mg | ORAL_TABLET | Freq: Every day | ORAL | 3 refills | Status: DC | PRN

## 2023-02-14 NOTE — Patient Instructions (Signed)
Medication Instructions:  No Changes *If you need a refill on your cardiac medications before your next appointment, please call your pharmacy*   Lab Work: Your physician recommends that you have lab work today: CBC, BMP, BNP   If you have labs (blood work) drawn today and your tests are completely normal, you will receive your results only by: MyChart Message (if you have MyChart) OR A paper copy in the mail If you have any lab test that is abnormal or we need to change your treatment, we will call you to review the results.   Testing/Procedures: Your physician has requested that you have an echocardiogram. Echocardiography is a painless test that uses sound waves to create images of your heart. It provides your doctor with information about the size and shape of your heart and how well your heart's chambers and valves are working. This procedure takes approximately one hour. There are no restrictions for this procedure. Please do NOT wear cologne, perfume, aftershave, or lotions (deodorant is allowed). Please arrive 15 minutes prior to your appointment time.    Follow-Up: At [facility], you and your health needs are our priority.  As part of our continuing mission to provide you with exceptional heart care, we have created designated Provider Care Teams.  These Care Teams include your primary Cardiologist (physician) and Advanced Practice Providers (APPs -  Physician Assistants and Nurse Practitioners) who all work together to provide you with the care you need, when you need it.   Your next appointment:   6 month(s)  Provider:   Robet Leu, PA-C

## 2023-02-15 LAB — BASIC METABOLIC PANEL
BUN/Creatinine Ratio: 17 (ref 10–24)
BUN: 18 mg/dL (ref 8–27)
CO2: 23 mmol/L (ref 20–29)
Calcium: 9.2 mg/dL (ref 8.6–10.2)
Chloride: 104 mmol/L (ref 96–106)
Creatinine, Ser: 1.05 mg/dL (ref 0.76–1.27)
Glucose: 76 mg/dL (ref 70–99)
Potassium: 4.3 mmol/L (ref 3.5–5.2)
Sodium: 142 mmol/L (ref 134–144)
eGFR: 69 mL/min/{1.73_m2} (ref >59)

## 2023-02-15 LAB — CBC
Hematocrit: 45 % (ref 37.5–51.0)
Hemoglobin: 14.8 g/dL (ref 13.0–17.7)
MCH: 28.2 pg (ref 26.6–33.0)
MCHC: 32.9 g/dL (ref 31.5–35.7)
MCV: 86 fL (ref 79–97)
Platelets: 203 10*3/uL (ref 150–450)
RBC: 5.24 x10E6/uL (ref 4.14–5.80)
RDW: 14.7 % (ref 11.6–15.4)
WBC: 6.8 10*3/uL (ref 3.4–10.8)

## 2023-02-15 LAB — BRAIN NATRIURETIC PEPTIDE: BNP: 190.7 pg/mL — ABNORMAL HIGH (ref 0.0–100.0)

## 2023-02-16 ENCOUNTER — Ambulatory Visit (INDEPENDENT_AMBULATORY_CARE_PROVIDER_SITE_OTHER): Payer: PPO | Admitting: Family Medicine

## 2023-02-16 ENCOUNTER — Encounter: Payer: Self-pay | Admitting: Family Medicine

## 2023-02-16 VITALS — BP 135/68 | HR 62 | Ht 66.0 in | Wt 205.0 lb

## 2023-02-16 DIAGNOSIS — R051 Acute cough: Secondary | ICD-10-CM | POA: Insufficient documentation

## 2023-02-16 MED ORDER — OMEPRAZOLE 20 MG PO CPDR: 20.0000 mg | DELAYED_RELEASE_CAPSULE | Freq: Every day | ORAL | 0 refills | Status: DC

## 2023-02-16 NOTE — Progress Notes (Signed)
   Acute Office Visit  Subjective:     Patient ID: Glenn Ortega, male    DOB: December 30, 1933, 89 y.o.   MRN: [id number]  Chief Complaint  Patient presents with   Cough    HPI Patient is in today for cough.  Also has concerns of a swollen gland on the side of his neck.  Review of Systems  Constitutional:  Negative for chills and fever.  Respiratory:  Positive for cough. Negative for shortness of breath.   Cardiovascular:  Negative for chest pain.  Neurological:  Negative for headaches.        Objective:    BP 135/68   Pulse 62   Ht 5\' 6"  (1.676 m)   Wt 205 lb (93 kg)   SpO2 95%   BMI 33.09 kg/m    Physical Exam Vitals and nursing note reviewed.  Constitutional:      General: He is not in acute distress.    Appearance: Normal appearance.  HENT:     Head: Normocephalic and atraumatic.     Right Ear: External ear normal.     Left Ear: External ear normal.     Nose: Nose normal.  Eyes:     Conjunctiva/sclera: Conjunctivae normal.  Cardiovascular:     Rate and Rhythm: Normal rate and regular rhythm.  Pulmonary:     Effort: Pulmonary effort is normal.     Breath sounds: Normal breath sounds.  Neurological:     General: No focal deficit present.     Mental Status: He is alert and oriented to person, place, and time.  Psychiatric:        Mood and Affect: Mood normal.        Behavior: Behavior normal.        Thought Content: Thought content normal.        Judgment: Judgment normal.     No results found for any visits on 02/16/23.      Assessment & Plan:   Problem List Items Addressed This Visit       Other   Acute cough - Primary    Patient presents with acute cough that happens only when he eats, watches TV, or drinks something cold. We will go ahead and try two weeks of a ppi  - follow up in two weeks.  If no better we can consider chest x-ray.  Foot cough is only been present for about 2 weeks now.  I am also concerned that differential can include EOS  and patient may need EGD -Gland on side of his neck is a swollen lymph node       Meds ordered this encounter  Medications   omeprazole (PRILOSEC) 20 MG capsule    Sig: Take 1 capsule (20 mg total) by mouth daily for 14 days.    Dispense:  14 capsule    Refill:  0    Return in about 1 week (around 02/23/2023).  Charlton Amor, DO

## 2023-02-16 NOTE — Assessment & Plan Note (Addendum)
Patient presents with acute cough that happens only when he eats, watches TV, or drinks something cold. We will go ahead and try two weeks of a ppi  - follow up in two weeks.  If no better we can consider chest x-ray.  Foot cough is only been present for about 2 weeks now.  I am also concerned that differential can include EOS and patient may need EGD -Gland on side of his neck is a swollen lymph node

## 2023-02-28 ENCOUNTER — Ambulatory Visit (INDEPENDENT_AMBULATORY_CARE_PROVIDER_SITE_OTHER): Payer: PPO | Admitting: Sports Medicine

## 2023-02-28 ENCOUNTER — Ambulatory Visit (INDEPENDENT_AMBULATORY_CARE_PROVIDER_SITE_OTHER): Payer: PPO

## 2023-02-28 DIAGNOSIS — M48061 Spinal stenosis, lumbar region without neurogenic claudication: Secondary | ICD-10-CM | POA: Diagnosis not present

## 2023-02-28 DIAGNOSIS — S32020A Wedge compression fracture of second lumbar vertebra, initial encounter for closed fracture: Secondary | ICD-10-CM | POA: Diagnosis not present

## 2023-02-28 MED ORDER — PREDNISONE 50 MG PO TABS
ORAL_TABLET | ORAL | 0 refills | Status: DC
Start: 2023-02-28 — End: 2023-03-07

## 2023-02-28 MED ORDER — PREGABALIN 75 MG PO CAPS
ORAL_CAPSULE | ORAL | 3 refills | Status: DC
Start: 2023-02-28 — End: 2023-04-04

## 2023-02-28 NOTE — Assessment & Plan Note (Signed)
Glenn Ortega returns, he is a pleasant 89 year old male, he is status post L3-L5 fusion/decompression in the distant past. More recently he is having increasing pain better with flexion with occasional radiation down the right leg. I explained to him the treatment protocol including 5 days of prednisone, Lyrica, he did not tolerate gabapentin, formal PT and x-rays, return to see me in 6 weeks, MR for interventional planning if not better.

## 2023-02-28 NOTE — Progress Notes (Signed)
    Procedures performed today:    None.  Independent interpretation of notes and tests performed by another provider:   None.  Brief History, Exam, Impression, and Recommendations:    Lumbar spinal stenosis Glenn Ortega returns, he is a pleasant 89 year old male, he is status post L3-L5 fusion/decompression in the distant past. More recently he is having increasing pain better with flexion with occasional radiation down the right leg. I explained to him the treatment protocol including 5 days of prednisone, Lyrica, he did not tolerate gabapentin, formal PT and x-rays, return to see me in 6 weeks, MR for interventional planning if not better.    ____________________________________________ Glenn Ortega. Glenn Ortega, M.D., ABFM., CAQSM., AME. Primary Care and Sports Medicine [facility]  Adjunct Professor of Family Medicine  [employer]  Restaurant manager, fast food

## 2023-03-07 ENCOUNTER — Encounter: Payer: Self-pay | Admitting: Family Medicine

## 2023-03-07 ENCOUNTER — Ambulatory Visit (INDEPENDENT_AMBULATORY_CARE_PROVIDER_SITE_OTHER): Payer: PPO | Admitting: Family Medicine

## 2023-03-07 VITALS — BP 140/58 | HR 56 | Ht 66.0 in | Wt 208.0 lb

## 2023-03-07 DIAGNOSIS — I10 Essential (primary) hypertension: Secondary | ICD-10-CM | POA: Diagnosis not present

## 2023-03-07 DIAGNOSIS — S41112A Laceration without foreign body of left upper arm, initial encounter: Secondary | ICD-10-CM | POA: Diagnosis not present

## 2023-03-07 DIAGNOSIS — R7301 Impaired fasting glucose: Secondary | ICD-10-CM | POA: Diagnosis not present

## 2023-03-07 DIAGNOSIS — E039 Hypothyroidism, unspecified: Secondary | ICD-10-CM

## 2023-03-07 DIAGNOSIS — I480 Paroxysmal atrial fibrillation: Secondary | ICD-10-CM | POA: Diagnosis not present

## 2023-03-07 NOTE — Patient Instructions (Signed)
2 WEEKS FOR BP CHECK WITH NURSE

## 2023-03-07 NOTE — Progress Notes (Signed)
   Established Patient Office Visit  Subjective   Patient ID: Glenn Ortega, male    DOB: [birth year]  Age: 89 y.o. MRN: [id number]  Chief Complaint  Patient presents with   Hypertension    HPI  Hypertension- Pt denies chest pain, SOB, dizziness, or heart palpitations.  Taking meds as directed w/o problems.  Denies medication side effects.    Impaired fasting glucose-no increased thirst or urination. No symptoms consistent with hypoglycemia.  Tripped on the curb a week ago and has been dealing with an  abrasion to the left lower outer arm.  Is been putting an over-the-counter antibacterial ointment on it but it still been bleeding and oozing a little bit.  He is just been keeping it covered with a Band-Aid.    ROS    Objective:     BP (!) 140/58   Pulse (!) 56   Ht 5\' 6"  (1.676 m)   Wt 208 lb (94.3 kg)   SpO2 99%   BMI 33.57 kg/m    Physical Exam Vitals reviewed.  Constitutional:      Appearance: He is well-developed.  HENT:     Head: Normocephalic and atraumatic.  Eyes:     Conjunctiva/sclera: Conjunctivae normal.  Cardiovascular:     Rate and Rhythm: Normal rate and regular rhythm.     Heart sounds: Normal heart sounds.  Pulmonary:     Effort: Pulmonary effort is normal.     Breath sounds: Normal breath sounds.  Skin:    General: Skin is warm and dry.     Coloration: Skin is not pale.     Comments: He has a torn skin abrasion on his left outer arm near the elbow.  It is actively bleeding.  There are some significant bruising around it but no sign of active cellulitis.  Neurological:     Mental Status: He is alert and oriented to person, place, and time.  Psychiatric:        Behavior: Behavior normal.      No results found for any visits on 03/07/23.    The ASCVD Risk score (Arnett DK, et al., 2019) failed to calculate for the following reasons:   The 2019 ASCVD risk score is only valid for ages 20 to 38    Assessment & Plan:   Problem List Items  Addressed This Visit       Cardiovascular and Mediastinum   HYPERTENSION, BENIGN - Primary    PLan to recheck blood pressure for discharge today.      Relevant Orders   TSH   COMPLETE METABOLIC PANEL WITH GFR   Hemoglobin A1c   AF (paroxysmal atrial fibrillation) (HCC)    As he is scheduled for an echocardiogram next month.        Endocrine   IFG (impaired fasting glucose)   Relevant Orders   Hemoglobin A1c   Hypothyroidism    Due to rechech TSH.       Relevant Orders   TSH   Other Visit Diagnoses     Arm laceration, left, initial encounter          Abrasion of the left arm -bandage removed.  Xeroform placed with gauze and then Coban.  I think a little bit of compression would help with the bleeding.  Can change dressing tomorrow.  Return in about 6 months (around 09/07/2023) for Pre-diabetes, Hypertension.    Nani Gasser, MD

## 2023-03-07 NOTE — Assessment & Plan Note (Signed)
As he is scheduled for an echocardiogram next month.

## 2023-03-07 NOTE — Assessment & Plan Note (Signed)
Due to rechech TSH.

## 2023-03-07 NOTE — Assessment & Plan Note (Signed)
PLan to recheck blood pressure for discharge today.

## 2023-03-08 ENCOUNTER — Ambulatory Visit (INDEPENDENT_AMBULATORY_CARE_PROVIDER_SITE_OTHER): Payer: PPO | Admitting: Sports Medicine

## 2023-03-08 ENCOUNTER — Other Ambulatory Visit (INDEPENDENT_AMBULATORY_CARE_PROVIDER_SITE_OTHER): Payer: PPO

## 2023-03-08 DIAGNOSIS — M170 Bilateral primary osteoarthritis of knee: Secondary | ICD-10-CM | POA: Diagnosis not present

## 2023-03-08 LAB — COMPLETE METABOLIC PANEL WITHOUT GFR
AG Ratio: 1.6 (calc) (ref 1.0–2.5)
ALT: 13 U/L (ref 9–46)
AST: 17 U/L (ref 10–35)
Albumin: 4 g/dL (ref 3.6–5.1)
Alkaline phosphatase (APISO): 43 U/L (ref 35–144)
BUN: 23 mg/dL (ref 7–25)
CO2: 24 mmol/L (ref 20–32)
Calcium: 8.9 mg/dL (ref 8.6–10.3)
Chloride: 106 mmol/L (ref 98–110)
Creat: 1.03 mg/dL (ref 0.70–1.22)
Globulin: 2.5 g/dL (ref 1.9–3.7)
Glucose, Bld: 90 mg/dL (ref 65–99)
Potassium: 3.8 mmol/L (ref 3.5–5.3)
Sodium: 144 mmol/L (ref 135–146)
Total Bilirubin: 0.8 mg/dL (ref 0.2–1.2)
Total Protein: 6.5 g/dL (ref 6.1–8.1)
eGFR: 71 mL/min/1.73m2 (ref >=60)

## 2023-03-08 LAB — HEMOGLOBIN A1C
Hgb A1c MFr Bld: 5.9 %{Hb} — ABNORMAL HIGH (ref <5.7)
Mean Plasma Glucose: 123 mg/dL
eAG (mmol/L): 6.8 mmol/L

## 2023-03-08 LAB — TSH: TSH: 2.73 m[IU]/L (ref 0.40–4.50)

## 2023-03-08 NOTE — Assessment & Plan Note (Signed)
Known bilateral knee osteoarthritis, last injected 3 months ago, increasing pain, bilateral knee joint injections today, adding physical therapy and bilateral reaction knee braces, return in 6 weeks.

## 2023-03-08 NOTE — Progress Notes (Signed)
Nadine Counts, A1c is up a little bit to 5.9 it was 5.7 last time so just a slight shift upward but still in the prediabetes range.  Just continue to work on healthy diet and regular exercise.  Metabolic panel and thyroid are at goal.  Please make sure to schedule your Medicare wellness exam with [facility] our Medicare wellness nurse, that she can even do it via phone or in person.

## 2023-03-08 NOTE — Progress Notes (Signed)
    Procedures performed today:    Procedure: Real-time Ultrasound Guided injection of the left knee Device: Samsung HS60  Verbal informed consent obtained.  Time-out conducted.  Noted no overlying erythema, induration, or other signs of local infection.  Skin prepped in a sterile fashion.  Local anesthesia: Topical Ethyl chloride.  With sterile technique and under real time ultrasound guidance: Mild effusion noted, 1 cc Kenalog 40, 2 cc lidocaine, 2 cc bupivacaine injected easily Completed without difficulty  Advised to call if fevers/chills, erythema, induration, drainage, or persistent bleeding.  Images permanently stored and available for review in PACS.  Impression: Technically successful ultrasound guided injection.  Procedure: Real-time Ultrasound Guided injection of the right knee Device: Samsung HS60  Verbal informed consent obtained.  Time-out conducted.  Noted no overlying erythema, induration, or other signs of local infection.  Skin prepped in a sterile fashion.  Local anesthesia: Topical Ethyl chloride.  With sterile technique and under real time ultrasound guidance: Mild effusion noted, 1 cc Kenalog 40, 2 cc lidocaine, 2 cc bupivacaine injected easily Completed without difficulty  Advised to call if fevers/chills, erythema, induration, drainage, or persistent bleeding.  Images permanently stored and available for review in PACS.  Impression: Technically successful ultrasound guided injection.  Independent interpretation of notes and tests performed by another provider:   None.  Brief History, Exam, Impression, and Recommendations:    Primary osteoarthritis of both knees Known bilateral knee osteoarthritis, last injected 3 months ago, increasing pain, bilateral knee joint injections today, adding physical therapy and bilateral reaction knee braces, return in 6 weeks.    ____________________________________________ Ihor Austin. Benjamin Stain, M.D., ABFM., CAQSM.,  AME. Primary Care and Sports Medicine [facility]  Adjunct Professor of Family Medicine  [employer]  Restaurant manager, fast food

## 2023-03-17 ENCOUNTER — Ambulatory Visit (INDEPENDENT_AMBULATORY_CARE_PROVIDER_SITE_OTHER): Payer: PPO | Admitting: Family Medicine

## 2023-03-17 DIAGNOSIS — Z0000 Encounter for general adult medical examination without abnormal findings: Secondary | ICD-10-CM | POA: Diagnosis not present

## 2023-03-17 NOTE — Patient Instructions (Signed)
MEDICARE ANNUAL WELLNESS VISIT Health Maintenance Summary and Written Plan of Care  Glenn Ortega ,  Thank you for allowing me to perform your Medicare Annual Wellness Visit and for your ongoing commitment to your health.   Health Maintenance & Immunization History Health Maintenance  Topic Date Due   INFLUENZA VACCINE  05/19/2023   Medicare Annual Wellness (AWV)  03/16/2024   DTaP/Tdap/Td (2 - Td or Tdap) 04/08/2024   Pneumonia Vaccine 53+ Years old  Completed   COVID-19 Vaccine  Completed   Zoster Vaccines- Shingrix  Completed   HPV VACCINES  Aged Out   Immunization History  Administered Date(s) Administered   Covid-19, Mrna,Vaccine(Spikevax)41yrs and older 07/26/2022   Fluad Quad(high Dose 65+) 06/18/2020, 08/11/2021, 07/12/2022   Influenza Whole 08/07/2008, 08/26/2009, 07/11/2010   Influenza, High Dose Seasonal PF 08/02/2017, 08/05/2017, 08/27/2017, 08/02/2018   Influenza, Seasonal, Injecte, Preservative Fre 07/22/2016   Influenza,inj,Quad PF,6+ Mos 06/18/2014, 08/18/2015, 10/08/2019   Influenza-Unspecified 09/17/2016, 06/18/2020   Moderna Covid-19 Vaccine Bivalent Booster 20yrs & up 08/11/2021   PFIZER(Purple Top)SARS-COV-2 Vaccination 11/11/2019, 12/03/2019, 07/18/2020   Pneumococcal Conjugate-13 04/08/2014   Pneumococcal Polysaccharide-23 10/28/2015, 07/22/2016   Tdap 04/08/2014   Zoster Recombinat (Shingrix) 08/15/2020, 09/08/2021    These are the patient goals that we discussed:  Goals Addressed               This Visit's Progress     Patient Stated (pt-stated)        Patient stated that he would like to keep working.         This is a list of Health Maintenance Items that are overdue or due now: There are no preventive care reminders to display for this patient.    Orders/Referrals Placed Today: No orders of the defined types were placed in this encounter.  (Contact our referral department at [phone] if you have not spoken with someone about your  referral appointment within the next 5 days)    Follow-up Plan Follow-up with Agapito Games, MD as planned Medicare wellness visit in one year.  Patient will access AVS on my chart.      Health Maintenance, Male Adopting a healthy lifestyle and getting preventive care are important in promoting health and wellness. Ask your health care provider about: The right schedule for you to have regular tests and exams. Things you can do on your own to prevent diseases and keep yourself healthy. What should I know about diet, weight, and exercise? Eat a healthy diet  Eat a diet that includes plenty of vegetables, fruits, low-fat dairy products, and lean protein. Do not eat a lot of foods that are high in solid fats, added sugars, or sodium. Maintain a healthy weight Body mass index (BMI) is a measurement that can be used to identify possible weight problems. It estimates body fat based on height and weight. Your health care provider can help determine your BMI and help you achieve or maintain a healthy weight. Get regular exercise Get regular exercise. This is one of the most important things you can do for your health. Most adults should: Exercise for at least 150 minutes each week. The exercise should increase your heart rate and make you sweat (moderate-intensity exercise). Do strengthening exercises at least twice a week. This is in addition to the moderate-intensity exercise. Spend less time sitting. Even light physical activity can be beneficial. Watch cholesterol and blood lipids Have your blood tested for lipids and cholesterol at 87 years of age, then have this  test every 5 years. You may need to have your cholesterol levels checked more often if: Your lipid or cholesterol levels are high. You are older than 87 years of age. You are at high risk for heart disease. What should I know about cancer screening? Many types of cancers can be detected early and may often be  prevented. Depending on your health history and family history, you may need to have cancer screening at various ages. This may include screening for: Colorectal cancer. Prostate cancer. Skin cancer. Lung cancer. What should I know about heart disease, diabetes, and high blood pressure? Blood pressure and heart disease High blood pressure causes heart disease and increases the risk of stroke. This is more likely to develop in people who have high blood pressure readings or are overweight. Talk with your health care provider about your target blood pressure readings. Have your blood pressure checked: Every 3-5 years if you are 66-93 years of age. Every year if you are 43 years old or older. If you are between the ages of 103 and 62 and are a current or former smoker, ask your health care provider if you should have a one-time screening for abdominal aortic aneurysm (AAA). Diabetes Have regular diabetes screenings. This checks your fasting blood sugar level. Have the screening done: Once every three years after age 67 if you are at a normal weight and have a low risk for diabetes. More often and at a younger age if you are overweight or have a high risk for diabetes. What should I know about preventing infection? Hepatitis B If you have a higher risk for hepatitis B, you should be screened for this virus. Talk with your health care provider to find out if you are at risk for hepatitis B infection. Hepatitis C Blood testing is recommended for: Everyone born from [birth year] through 1965. Anyone with known risk factors for hepatitis C. Sexually transmitted infections (STIs) You should be screened each year for STIs, including gonorrhea and chlamydia, if: You are sexually active and are younger than 87 years of age. You are older than 87 years of age and your health care provider tells you that you are at risk for this type of infection. Your sexual activity has changed since you were last screened,  and you are at increased risk for chlamydia or gonorrhea. Ask your health care provider if you are at risk. Ask your health care provider about whether you are at high risk for HIV. Your health care provider may recommend a prescription medicine to help prevent HIV infection. If you choose to take medicine to prevent HIV, you should first get tested for HIV. You should then be tested every 3 months for as long as you are taking the medicine. Follow these instructions at home: Alcohol use Do not drink alcohol if your health care provider tells you not to drink. If you drink alcohol: Limit how much you have to 0-2 drinks a day. Know how much alcohol is in your drink. In the [place] one drink equals one 12 oz bottle of beer (355 mL), one 5 oz glass of wine (148 mL), or one 1 oz glass of hard liquor (44 mL). Lifestyle Do not use any products that contain nicotine or tobacco. These products include cigarettes, chewing tobacco, and vaping devices, such as e-cigarettes. If you need help quitting, ask your health care provider. Do not use street drugs. Do not share needles. Ask your health care provider for help if you need  support or information about quitting drugs. General instructions Schedule regular health, dental, and eye exams. Stay current with your vaccines. Tell your health care provider if: You often feel depressed. You have ever been abused or do not feel safe at home. Summary Adopting a healthy lifestyle and getting preventive care are important in promoting health and wellness. Follow your health care provider's instructions about healthy diet, exercising, and getting tested or screened for diseases. Follow your health care provider's instructions on monitoring your cholesterol and blood pressure. This information is not intended to replace advice given to you by your health care provider. Make sure you discuss any questions you have with your health care provider. Document Revised:  02/23/2021 Document Reviewed: 02/23/2021 Elsevier Patient Education  2024 [employer].

## 2023-03-17 NOTE — Progress Notes (Signed)
MEDICARE ANNUAL WELLNESS VISIT  03/17/2023  Telephone Visit Disclaimer This Medicare AWV was conducted by telephone due to national recommendations for restrictions regarding the COVID-19 Pandemic (e.g. social distancing).  I verified, using two identifiers, that I am speaking with Glenn Ortega or their authorized healthcare agent. I discussed the limitations, risks, security, and privacy concerns of performing an evaluation and management service by telephone and the potential availability of an in-person appointment in the future. The patient expressed understanding and agreed to proceed.  Location of Patient: Home Location of Provider (nurse):  In the office.  Subjective:    Glenn Ortega is a 89 y.o. male patient of Metheney, Barbarann Ehlers, MD who had a Medicare Annual Wellness Visit today via telephone. Glenn Ortega is part-time and lives alone. he has 2 children. he reports that he is socially active and does interact with friends/family regularly. he is moderately physically active and enjoys fishing.  Patient Care Team: Agapito Games, MD as PCP - General (Family Medicine) Lennette Bihari, MD as PCP - Cardiology (Cardiology)     03/17/2023    8:28 AM 02/22/2022    4:07 PM 06/28/2021    4:50 AM 06/27/2021   10:10 PM 03/22/2021   11:00 PM 03/22/2021    1:13 PM 02/17/2021    3:35 PM  Advanced Directives  Does Patient Have a Medical Advance Directive? No No  No No No Yes  Type of Tax inspector;Living will  Does patient want to make changes to medical advance directive?       No - Patient declined  Copy of Healthcare Power of Attorney in Chart?       No - copy requested  Would patient like information on creating a medical advance directive? No - Patient declined  No - Patient declined  No - Patient declined No - Patient declined     Hospital Utilization Over the Past 12 Months: # of hospitalizations or ER visits: 0 # of surgeries:  0  Review of Systems    Patient reports that his overall health is unchanged compared to last year.  History obtained from chart review and the patient  Patient Reported Readings (BP, Pulse, CBG, Weight, etc) none  Pain Assessment Pain : No/denies pain     Current Medications & Allergies (verified) Allergies as of 03/17/2023       Reactions   Penicillins Swelling   CHILDHOOD ALLERGY Has patient had a PCN reaction causing immediate rash, facial/tongue/throat swelling, SOB or lightheadedness with hypotension: Yes Has patient had a PCN reaction causing severe rash involving mucus membranes or skin necrosis: No Has patient had a PCN reaction that required hospitalization: No Has patient had a PCN reaction occurring within the last 10 years: No If all of the above answers are "NO", then may proceed with Cephalosporin use.   Gabapentin Other (See Comments)   "MADE ME FEEL CRAZY"   Lisinopril Cough   Penicillin G Hives        Medication List        Accurate as of Mar 17, 2023  8:36 AM. If you have any questions, ask your nurse or doctor.          acetaminophen 650 MG CR tablet Commonly known as: TYLENOL Take 1 tablet (650 mg total) by mouth every 8 (eight) hours as needed for pain.   apixaban 5 MG Tabs tablet Commonly known as: Eliquis Take 1  tablet (5 mg total) by mouth 2 (two) times daily.   atorvastatin 10 MG tablet Commonly known as: LIPITOR Take 1 tablet (10 mg total) by mouth daily.   furosemide 40 MG tablet Commonly known as: LASIX Take 1 tablet (40 mg total) by mouth daily as needed.   latanoprost 0.005 % ophthalmic solution Commonly known as: XALATAN Place 1 drop into both eyes at bedtime.   levothyroxine 50 MCG tablet Commonly known as: SYNTHROID Take 1 tablet by mouth every morning.   losartan 25 MG tablet Commonly known as: COZAAR TAKE ONE TABLET BY MOUTH DAILY   metoprolol succinate 25 MG 24 hr tablet Commonly known as: TOPROL-XL Take 1/2  tablet by mouth daily. Take with or immediately following a meal.   pregabalin 75 MG capsule Commonly known as: Lyrica 1 capsule p.o. nightly for a week then twice daily for a week then 3 times daily if needed   PRESERVISION AREDS 2 PO Take 1 capsule by mouth 2 (two) times daily.        History (reviewed): Past Medical History:  Diagnosis Date   Atrial fibrillation (HCC)    Back pain    GERD (gastroesophageal reflux disease)    Hyperlipidemia    Hypertension    EKG and chest x ray 8/12 EPIC/states had stress test 2 yrs ago- doesnt remember where- not in EPIC   Spinal stenosis    lumbar   Thyroid trouble    Past Surgical History:  Procedure Laterality Date   CARPAL TUNNEL RELEASE Right    09/2021   COLONOSCOPY     CYSTOSCOPY  12/30/2011   Procedure: CYSTOSCOPY;  Surgeon: Martina Sinner, MD;  Location: [facility];  Service: Urology;  Laterality: N/A;   EYE SURGERY Bilateral 06/18/2017   cataract sx with lens implant   HERNIA REPAIR  2013   HERNIA REPAIR Right    age  89   LUMBAR LAMINECTOMY/DECOMPRESSION MICRODISCECTOMY N/A 11/09/2013   Procedure: Lumbar Laminectomy/Decompression, Microdiscectomy Lumbar Three-Four, Four-Five, with Coflex;  Surgeon: Reinaldo Meeker, MD;  Location: [facility];  Service: Neurosurgery;  Laterality: N/A;  Lumbar Laminectomy/Decompression, Microdiscectomy Lumbar Three-Four, Four-Five, with Coflex   PAROTIDECTOMY Left 11/17/2015   Procedure: LEFT PAROTIDECTOMY;  Surgeon: Serena Colonel, MD;  Location: [facility];  Service: ENT;  Laterality: Left;   PROSTATE SURGERY  2013   SALIVARY GLAND SURGERY     TONSILLECTOMY     trigger thumb  1980   right   Family History  Problem Relation Age of Onset   Hypertension Other    Cancer Mother        bone?   Myasthenia gravis Father    Obesity Father    Heart disease Brother    Social History   Socioeconomic History   Marital status: Widowed    Spouse name: Lupita Leash   Number of  children: 2   Years of education: 14   Highest education level: Some college, no degree  Occupational History   Occupation: Retired  Tobacco Use   Smoking status: Never   Smokeless tobacco: Never   Tobacco comments:    per patient he never smoke. 11/25/2014 /rc  Vaping Use   Vaping Use: Never used  Substance and Sexual Activity   Alcohol use: Yes    Alcohol/week: 2.0 standard drinks of alcohol    Types: 2 Standard drinks or equivalent per week    Comment: socially- occ beer   Drug use: No   Sexual activity: Not  Currently  Other Topics Concern   Not on file  Social History Narrative   Works 3 nights a week at [employer].    Previously worked as a Production designer, theatre/television/film for [employer].     Lives in a one story home.     Has 2 children.    Moved from [place]   Wife passed from cancer in 2021.    Social Determinants of Health   Financial Resource Strain: Low Risk  (03/17/2023)   Overall Financial Resource Strain (CARDIA)    Difficulty of Paying Living Expenses: Not hard at all  Food Insecurity: No Food Insecurity (03/17/2023)   Hunger Vital Sign    Worried About Running Out of Food in the Last Year: Never true    Ran Out of Food in the Last Year: Never true  Transportation Needs: No Transportation Needs (03/17/2023)   PRAPARE - Administrator, Civil Service (Medical): No    Lack of Transportation (Non-Medical): No  Physical Activity: Sufficiently Active (03/17/2023)   Exercise Vital Sign    Days of Exercise per Week: 4 days    Minutes of Exercise per Session: 90 min  Stress: No Stress Concern Present (03/17/2023)   [employer] of Occupational Health - Occupational Stress Questionnaire    Feeling of Stress : Not at all  Social Connections: Moderately Isolated (03/17/2023)   Social Connection and Isolation Panel [NHANES]    Frequency of Communication with Friends and Family: More than three times a week    Frequency of Social Gatherings with Friends and Family: Three  times a week    Attends Religious Services: More than 4 times per year    Active Member of Clubs or Organizations: No    Attends Banker Meetings: Never    Marital Status: Widowed    Activities of Daily Living    03/17/2023    8:30 AM  In your present state of health, do you have any difficulty performing the following activities:  Hearing? 0  Vision? 0  Difficulty concentrating or making decisions? 0  Walking or climbing stairs? 0  Dressing or bathing? 0  Doing errands, shopping? 0  Preparing Food and eating ? N  Using the Toilet? N  In the past six months, have you accidently leaked urine? N  Do you have problems with loss of bowel control? N  Managing your Medications? N  Managing your Finances? N  Housekeeping or managing your Housekeeping? N    Patient Education/ Literacy How often do you need to have someone help you when you read instructions, pamphlets, or other written materials from your doctor or pharmacy?: 1 - Never What is the last grade level you completed in school?: 2 years of college  Exercise Current Exercise Habits: The patient has a physically strenuous job, but has no regular exercise apart from work., Exercise limited by: None identified  Diet Patient reports consuming 3 meals a day and 1 snack(s) a day Patient reports that his primary diet is: Regular Patient reports that she does have regular access to food.   Depression Screen    03/17/2023    8:28 AM 11/22/2022    8:52 AM 09/06/2022    7:25 AM 02/22/2022    4:08 PM 02/17/2021    3:36 PM 07/15/2020    7:45 AM 06/18/2020   10:02 AM  PHQ 2/9 Scores  PHQ - 2 Score 0 2 0 0 0 1 0  PHQ- 9 Score  5      Fall Risk    03/17/2023    8:28 AM 03/07/2023    8:44 AM 11/22/2022    8:51 AM 09/06/2022    7:25 AM 02/22/2022    4:08 PM  Fall Risk   Falls in the past year? 1 1 0 0 1  Comment     slipped on the ice  Number falls in past yr: 0 0 0 0 1  Injury with Fall? 1 0 0 0 1  Risk for fall  due to : History of fall(s) Other (Comment) No Fall Risks No Fall Risks Medication side effect  Follow up Falls evaluation completed;Education provided;Falls prevention discussed Falls evaluation completed Falls evaluation completed Falls evaluation completed Falls evaluation completed;Education provided;Falls prevention discussed     Objective:  Glenn Ortega seemed alert and oriented and he participated appropriately during our telephone visit.  Blood Pressure Weight BMI  BP Readings from Last 3 Encounters:  03/07/23 (!) 140/58  02/16/23 135/68  02/14/23 130/74   Wt Readings from Last 3 Encounters:  03/07/23 208 lb (94.3 kg)  02/16/23 205 lb (93 kg)  02/14/23 204 lb 3.2 oz (92.6 kg)   BMI Readings from Last 1 Encounters:  03/07/23 33.57 kg/m    *Unable to obtain current vital signs, weight, and BMI due to telephone visit type  Hearing/Vision  Glenn Ortega did not seem to have difficulty with hearing/understanding during the telephone conversation Reports that he has had a formal eye exam by an eye care professional within the past year Reports that he has not had a formal hearing evaluation within the past year *Unable to fully assess hearing and vision during telephone visit type  Cognitive Function:    03/17/2023    8:31 AM 02/22/2022    4:10 PM  6CIT Screen  What Year? 0 points 0 points  What month? 0 points 0 points  What time? 0 points 0 points  Count back from 20 0 points 0 points  Months in reverse 2 points 2 points  Repeat phrase 4 points 2 points  Total Score 6 points 4 points   (Normal:0-7, Significant for Dysfunction: >8)  Normal Cognitive Function Screening: Yes   Immunization & Health Maintenance Record Immunization History  Administered Date(s) Administered   Covid-19, Mrna,Vaccine(Spikevax)48yrs and older 07/26/2022   Fluad Quad(high Dose 65+) 06/18/2020, 08/11/2021, 07/12/2022   Influenza Whole 08/07/2008, 08/26/2009, 07/11/2010   Influenza, High Dose  Seasonal PF 08/02/2017, 08/05/2017, 08/27/2017, 08/02/2018   Influenza, Seasonal, Injecte, Preservative Fre 07/22/2016   Influenza,inj,Quad PF,6+ Mos 06/18/2014, 08/18/2015, 10/08/2019   Influenza-Unspecified 09/17/2016, 06/18/2020   Moderna Covid-19 Vaccine Bivalent Booster 33yrs & up 08/11/2021   PFIZER(Purple Top)SARS-COV-2 Vaccination 11/11/2019, 12/03/2019, 07/18/2020   Pneumococcal Conjugate-13 04/08/2014   Pneumococcal Polysaccharide-23 10/28/2015, 07/22/2016   Tdap 04/08/2014   Zoster Recombinat (Shingrix) 08/15/2020, 09/08/2021    Health Maintenance  Topic Date Due   INFLUENZA VACCINE  05/19/2023   Medicare Annual Wellness (AWV)  03/16/2024   DTaP/Tdap/Td (2 - Td or Tdap) 04/08/2024   Pneumonia Vaccine 69+ Years old  Completed   COVID-19 Vaccine  Completed   Zoster Vaccines- Shingrix  Completed   HPV VACCINES  Aged Out       Assessment  This is a routine wellness examination for Glenn Ortega.  Health Maintenance: Due or Overdue There are no preventive care reminders to display for this patient.   Glenn Ortega does not need a referral for Community Assistance: Care Management:  no Social Work:    no Prescription Assistance:  no Nutrition/Diabetes Education:  no   Plan:  Personalized Goals  Goals Addressed               This Visit's Progress     Patient Stated (pt-stated)        Patient stated that he would like to keep working.       Personalized Health Maintenance & Screening Recommendations  There are no preventive care reminders to display for this patient.  Lung Cancer Screening Recommended: no (Low Dose CT Chest recommended if Age 29-80 years, 30 pack-year currently smoking OR have quit w/in past 15 years) Hepatitis C Screening recommended: no HIV Screening recommended: no  Advanced Directives: Written information was not prepared per patient's request.  Referrals & Orders No orders of the defined types were placed in this  encounter.   Follow-up Plan Follow-up with Agapito Games, MD as planned Medicare wellness visit in one year.  Patient will access AVS on my chart.   I have personally reviewed and noted the following in the patient's chart:   Medical and social history Use of alcohol, tobacco or illicit drugs  Current medications and supplements Functional ability and status Nutritional status Physical activity Advanced directives List of other physicians Hospitalizations, surgeries, and ER visits in previous 12 months Vitals Screenings to include cognitive, depression, and falls Referrals and appointments  In addition, I have reviewed and discussed with Glenn Ortega certain preventive protocols, quality metrics, and best practice recommendations. A written personalized care plan for preventive services as well as general preventive health recommendations is available and can be mailed to the patient at his request.      Modesto Charon, RN BSN  03/17/2023

## 2023-03-19 ENCOUNTER — Other Ambulatory Visit: Payer: Self-pay | Admitting: Cardiovascular Disease

## 2023-03-21 ENCOUNTER — Encounter: Payer: Self-pay | Admitting: Physical Therapy

## 2023-03-21 ENCOUNTER — Other Ambulatory Visit: Payer: Self-pay

## 2023-03-21 ENCOUNTER — Ambulatory Visit (HOSPITAL_COMMUNITY): Payer: PPO | Attending: Cardiology

## 2023-03-21 ENCOUNTER — Ambulatory Visit: Payer: PPO

## 2023-03-21 ENCOUNTER — Ambulatory Visit: Payer: PPO | Attending: Sports Medicine | Admitting: Physical Therapy

## 2023-03-21 DIAGNOSIS — M48061 Spinal stenosis, lumbar region without neurogenic claudication: Secondary | ICD-10-CM | POA: Insufficient documentation

## 2023-03-21 DIAGNOSIS — R262 Difficulty in walking, not elsewhere classified: Secondary | ICD-10-CM | POA: Insufficient documentation

## 2023-03-21 DIAGNOSIS — M6281 Muscle weakness (generalized): Secondary | ICD-10-CM | POA: Insufficient documentation

## 2023-03-21 DIAGNOSIS — I361 Nonrheumatic tricuspid (valve) insufficiency: Secondary | ICD-10-CM | POA: Diagnosis not present

## 2023-03-21 DIAGNOSIS — I071 Rheumatic tricuspid insufficiency: Secondary | ICD-10-CM | POA: Insufficient documentation

## 2023-03-21 LAB — ECHOCARDIOGRAM COMPLETE
Area-P 1/2: 3.44 cm2
Calc EF: 62.9 %
MV M vel: 4.46 m/s
MV Peak grad: 79.6 mmHg
S' Lateral: 3.2 cm
Single Plane A2C EF: 67.6 %
Single Plane A4C EF: 56.7 %

## 2023-03-21 NOTE — Therapy (Signed)
OUTPATIENT PHYSICAL THERAPY LOWER EXTREMITY EVALUATION   Patient Name: Glenn Ortega MRN: [id number] DOB:28-Sep-1934, 89 y.o., male Today's Date: 03/21/2023  END OF SESSION:  PT End of Session - 03/21/23 1520     Visit Number 1    Number of Visits 12    Date for PT Re-Evaluation 05/02/23    Authorization Type health team advantage    PT Start Time 1440    PT Stop Time 1518    PT Time Calculation (min) 38 min    Activity Tolerance Patient tolerated treatment well    Behavior During Therapy [facility] for tasks assessed/performed             Past Medical History:  Diagnosis Date   Atrial fibrillation (HCC)    Back pain    GERD (gastroesophageal reflux disease)    Hyperlipidemia    Hypertension    EKG and chest x ray 8/12 EPIC/states had stress test 2 yrs ago- doesnt remember where- not in EPIC   Spinal stenosis    lumbar   Thyroid trouble    Past Surgical History:  Procedure Laterality Date   CARPAL TUNNEL RELEASE Right    09/2021   COLONOSCOPY     CYSTOSCOPY  12/30/2011   Procedure: CYSTOSCOPY;  Surgeon: Martina Sinner, MD;  Location: [facility];  Service: Urology;  Laterality: N/A;   EYE SURGERY Bilateral 06/18/2017   cataract sx with lens implant   HERNIA REPAIR  2013   HERNIA REPAIR Right    age  89   LUMBAR LAMINECTOMY/DECOMPRESSION MICRODISCECTOMY N/A 11/09/2013   Procedure: Lumbar Laminectomy/Decompression, Microdiscectomy Lumbar Three-Four, Four-Five, with Coflex;  Surgeon: Reinaldo Meeker, MD;  Location: [facility];  Service: Neurosurgery;  Laterality: N/A;  Lumbar Laminectomy/Decompression, Microdiscectomy Lumbar Three-Four, Four-Five, with Coflex   PAROTIDECTOMY Left 11/17/2015   Procedure: LEFT PAROTIDECTOMY;  Surgeon: Serena Colonel, MD;  Location: [facility];  Service: ENT;  Laterality: Left;   PROSTATE SURGERY  2013   SALIVARY GLAND SURGERY     TONSILLECTOMY     trigger thumb  1980   right   Patient Active Problem List   Diagnosis Date  Noted   IFG (impaired fasting glucose) 03/07/2023   Primary osteoarthritis of first carpometacarpal joint of left hand 11/29/2022   Stenosing tenosynovitis of finger of left hand 08/16/2022   Age-related macular degeneration 06/28/2021   Sleep apnea 06/28/2021   Hypokalemia 06/28/2021   Right elbow pain 03/19/2021   Depressed mood, not depression 08/18/2020   Senile purpura (HCC) 08/14/2020   Vitamin D deficiency 07/29/2020   Hypothyroidism 07/15/2020   Hyperglycemia 07/15/2020   BMI 38.0-38.9,adult 06/18/2020   Bilateral lower extremity edema 06/18/2020   AF (paroxysmal atrial fibrillation) (HCC) 06/17/2020   BPPV (benign paroxysmal positional vertigo) 06/17/2020   Primary osteoarthritis of both knees 09/17/2019   Mixed hyperlipidemia 09/27/2018   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 05/03/2016   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   Parotid mass 11/17/2015   Carpal tunnel syndrome, bilateral 08/18/2015   Unspecified hereditary and idiopathic peripheral neuropathy 06/18/2014   Lumbar spinal stenosis 11/09/2013   ESOPHAGEAL REFLUX 04/10/2010   HYPERTENSION, PULMONARY 04/07/2010   UNSPECIFIED HEARING LOSS 08/07/2008   HYPERTENSION, BENIGN 08/07/2008   BPH (benign prostatic hyperplasia) 08/07/2008   SCIATICA 08/07/2008    PCP: Linford Arnold  REFERRING PROVIDER: Benjamin Stain  REFERRING DIAG: primary OA of bilat knees  THERAPY DIAG:  Difficulty in walking, not elsewhere classified  Muscle weakness (generalized)  Rationale for  Evaluation and Treatment: Rehabilitation  ONSET DATE: 02/2023 (MD appointment)  SUBJECTIVE:   SUBJECTIVE STATEMENT: Pt states he has had knee pain in both knees for years and has been getting shots every 3 months. MD now has recommended physical therapy. Pt states his Rt knee is "going backwards" and it will "give out". Pt states knee stiffness is worse in the mornings and that stiffness increases with prolonged walking, eases with use of knee brace but  he states the brace is too bulky and difficult to put on.  PERTINENT HISTORY: History of lumbar surgery PAIN:  Are you having pain?  Pt reports no knee pain, just "Stiffness, like it's not working right"  PRECAUTIONS: Fall  WEIGHT BEARING RESTRICTIONS: No  FALLS:  Has patient fallen in last 6 months? 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 Works out at gym and in pool  PLOF: Independent  PATIENT GOALS: "feel like I can walk normally"  NEXT MD VISIT: PRN  OBJECTIVE:   EDEMA:  +2 edema bilat LEs  MUSCLE LENGTH: Hamstrings: Right 70 deg; Left 70 deg Thomas test: positive bilat   PALPATION: Patella mobility WFL  LOWER EXTREMITY ROM:  Active ROM Right eval Left eval  Hip flexion    Hip extension    Hip abduction    Hip adduction    Hip internal rotation    Hip external rotation    Knee flexion 115 115  Knee extension 0 0  Ankle dorsiflexion    Ankle plantarflexion    Ankle inversion    Ankle eversion     (Blank rows = not tested)  LOWER EXTREMITY MMT:  MMT Right eval Left eval  Hip flexion 4 4-  Hip extension  3+  Hip abduction 4 4-  Hip adduction    Hip internal rotation    Hip external rotation    Knee flexion 4- 3+  Knee extension 4 4  Ankle dorsiflexion    Ankle plantarflexion 4- 4-  Ankle inversion    Ankle eversion     (Blank rows = not tested)   FUNCTIONAL TESTS:  5 times sit to stand: 12.59 sec SLS: Lt 3 sec, Rt unable  Stairs: Rt LE able to step up 6'' without UE support, Lt UE requires UE support to step up 6'' step  TODAY'S TREATMENT:                                                                                                                              DATE: 03/21/23 See HEP    PATIENT EDUCATION:  Education details: PT POC and goals, HEP Person educated: Patient Education method: Explanation, Demonstration, and  Handouts Education comprehension: verbalized understanding and returned demonstration  HOME EXERCISE PROGRAM: Access Code: [email or web address] URL: [email or web address] Date: 03/21/2023 Prepared by: Reggy Eye  Exercises - Hip Abduction with Resistance Loop  - 1 x  daily - 7 x weekly - 3 sets - 10 reps - Hip Extension with Resistance Loop  - 1 x daily - 7 x weekly - 3 sets - 10 reps - Standing Hamstring Curl with Resistance  - 1 x daily - 7 x weekly - 3 sets - 10 reps - Marching with Resistance  - 1 x daily - 7 x weekly - 3 sets - 10 reps  ASSESSMENT:  CLINICAL IMPRESSION: Patient is a 89 y.o. male who was seen today for physical therapy evaluation and treatment for primary OA of both knees. Pt presents with impaired gait and balance, decreased hip, knee and ankle strength and will benefit from skilled PT to address deficits and improve functional mobility and activity tolerance.   OBJECTIVE IMPAIRMENTS: decreased activity tolerance, difficulty walking, and decreased strength.   ACTIVITY LIMITATIONS: locomotion level  PARTICIPATION LIMITATIONS: community activity and occupation  PERSONAL FACTORS: Age and Time since onset of injury/illness/exacerbation are also affecting patient's functional outcome.   REHAB POTENTIAL: Good  CLINICAL DECISION MAKING: Evolving/moderate complexity  EVALUATION COMPLEXITY: Moderate   GOALS: Goals reviewed with patient? Yes  SHORT TERM GOALS: Target date: 04/04/2023   Pt will be independent with initial HEP Baseline: Goal status: INITIAL   LONG TERM GOALS: Target date: 05/02/2023    Pt will be independent with advanced HEP Baseline:  Goal status: INITIAL  2.  Pt will improve bilat LE strength to 4+/5 to improve standing and walking tolerance Baseline:  Goal status: INITIAL  3.  Pt will be able to step up 6'' step with Lt LE without UE support Baseline:  Goal status: INITIAL  4.  Pt will perform SLS >= 10 sec  bilat Baseline:  Goal status: INITIAL    PLAN:  PT FREQUENCY: 2x/week  PT DURATION: 6 weeks  PLANNED INTERVENTIONS: Therapeutic exercises, Therapeutic activity, Neuromuscular re-education, Balance training, Gait training, Patient/Family education, Self Care, Joint mobilization, Stair training, Aquatic Therapy, Dry Needling, Electrical stimulation, Cryotherapy, Moist heat, Taping, Ultrasound, Ionotophoresis 4mg /ml Dexamethasone, Manual therapy, and Re-evaluation  PLAN FOR NEXT SESSION: assess response to HEP, resisted walking, balance, knee, hip, ankle strength   Anjenette Gerbino, PT 03/21/2023, 3:21 PM

## 2023-03-27 NOTE — Therapy (Addendum)
 OUTPATIENT PHYSICAL THERAPY TREATMENT AND DISCHARGE SUMMARY   Patient Name: Glenn Ortega MRN: [id number] DOB:[birth year], 89 y.o., male Today's Date: 03/28/2023  END OF SESSION:  PT End of Session - 03/28/23 0752     Visit Number 2    Number of Visits 12    Date for PT Re-Evaluation 05/02/23    Authorization Type health team advantage    PT Start Time 0758    PT Stop Time 0844    PT Time Calculation (min) 46 min    Activity Tolerance Patient tolerated treatment well    Behavior During Therapy [facility] for tasks assessed/performed              Past Medical History:  Diagnosis Date   Atrial fibrillation (HCC)    Back pain    GERD (gastroesophageal reflux disease)    Hyperlipidemia    Hypertension    EKG and chest x ray 8/12 EPIC/states had stress test 2 yrs ago- doesnt remember where- not in EPIC   Spinal stenosis    lumbar   Thyroid trouble    Past Surgical History:  Procedure Laterality Date   CARPAL TUNNEL RELEASE Right    09/2021   COLONOSCOPY     CYSTOSCOPY  12/30/2011   Procedure: CYSTOSCOPY;  Surgeon: Martina Sinner, MD;  Location: [facility];  Service: Urology;  Laterality: N/A;   EYE SURGERY Bilateral 06/18/2017   cataract sx with lens implant   HERNIA REPAIR  2013   HERNIA REPAIR Right    age  34   LUMBAR LAMINECTOMY/DECOMPRESSION MICRODISCECTOMY N/A 11/09/2013   Procedure: Lumbar Laminectomy/Decompression, Microdiscectomy Lumbar Three-Four, Four-Five, with Coflex;  Surgeon: Reinaldo Meeker, MD;  Location: [facility];  Service: Neurosurgery;  Laterality: N/A;  Lumbar Laminectomy/Decompression, Microdiscectomy Lumbar Three-Four, Four-Five, with Coflex   PAROTIDECTOMY Left 11/17/2015   Procedure: LEFT PAROTIDECTOMY;  Surgeon: Serena Colonel, MD;  Location: [facility];  Service: ENT;  Laterality: Left;   PROSTATE SURGERY  2013   SALIVARY GLAND SURGERY     TONSILLECTOMY     trigger thumb  1980   right   Patient Active Problem List   Diagnosis  Date Noted   IFG (impaired fasting glucose) 03/07/2023   Primary osteoarthritis of first carpometacarpal joint of left hand 11/29/2022   Stenosing tenosynovitis of finger of left hand 08/16/2022   Age-related macular degeneration 06/28/2021   Sleep apnea 06/28/2021   Hypokalemia 06/28/2021   Right elbow pain 03/19/2021   Depressed mood, not depression 08/18/2020   Senile purpura (HCC) 08/14/2020   Vitamin D deficiency 07/29/2020   Hypothyroidism 07/15/2020   Hyperglycemia 07/15/2020   BMI 38.0-38.9,adult 06/18/2020   Bilateral lower extremity edema 06/18/2020   AF (paroxysmal atrial fibrillation) (HCC) 06/17/2020   BPPV (benign paroxysmal positional vertigo) 06/17/2020   Primary osteoarthritis of both knees 09/17/2019   Mixed hyperlipidemia 09/27/2018   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 05/03/2016   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   Parotid mass 11/17/2015   Carpal tunnel syndrome, bilateral 08/18/2015   Unspecified hereditary and idiopathic peripheral neuropathy 06/18/2014   Lumbar spinal stenosis 11/09/2013   ESOPHAGEAL REFLUX 04/10/2010   HYPERTENSION, PULMONARY 04/07/2010   UNSPECIFIED HEARING LOSS 08/07/2008   HYPERTENSION, BENIGN 08/07/2008   BPH (benign prostatic hyperplasia) 08/07/2008   SCIATICA 08/07/2008    PCP: Linford Arnold  REFERRING PROVIDER: Benjamin Stain  REFERRING DIAG: primary OA of bilat knees  THERAPY DIAG:  Difficulty in walking, not elsewhere classified  Muscle weakness (generalized)  Rationale for Evaluation and Treatment: Rehabilitation  ONSET DATE: 02/2023 (MD appointment)  SUBJECTIVE:   SUBJECTIVE STATEMENT: I have arthritis in my low back. I can only come on Mondays.  PERTINENT HISTORY: History of lumbar surgery PAIN:  Are you having pain?  Pt reports no knee pain, just "Stiffness, like it's not working right"  PRECAUTIONS: Fall  WEIGHT BEARING RESTRICTIONS: No  FALLS:  Has patient fallen in last 6 months? 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 Works out at gym and in pool  PLOF: Independent  PATIENT GOALS: "feel like I can walk normally"  NEXT MD VISIT: PRN  OBJECTIVE:   EDEMA:  +2 edema bilat LEs  MUSCLE LENGTH: Hamstrings: Right 70 deg; Left 70 deg Thomas test: positive bilat   PALPATION: Patella mobility WFL  LOWER EXTREMITY ROM:  Active ROM Right eval Left eval  Hip flexion    Hip extension    Hip abduction    Hip adduction    Hip internal rotation    Hip external rotation    Knee flexion 115 115  Knee extension 0 0  Ankle dorsiflexion    Ankle plantarflexion    Ankle inversion    Ankle eversion     (Blank rows = not tested)  LOWER EXTREMITY MMT:  MMT Right eval Left eval  Hip flexion 4 4-  Hip extension  3+  Hip abduction 4 4-  Hip adduction    Hip internal rotation    Hip external rotation    Knee flexion 4- 3+  Knee extension 4 4  Ankle dorsiflexion    Ankle plantarflexion 4- 4-  Ankle inversion    Ankle eversion     (Blank rows = not tested)   FUNCTIONAL TESTS:  5 times sit to stand: 12.59 sec SLS: Lt 3 sec, Rt unable  Stairs: Rt LE able to step up 6'' without UE support, Lt UE requires UE support to step up 6'' step  TODAY'S TREATMENT:                                                                                                                              DATE:  03/28/23 Bike L2 x 4 min Seated hip flexor stretch B x 60 sec Hip ABD and Ext with RTB 2x 10 ea Standing HS curl with RTBx 10 Marching with RTB resistance x 10  Heel raises x 20 Squats at stairs x 10 Step ups 6 inch steps x 10 B Resisted walking 10# x 5 ea 4 way with intermittent CGA LAQ 3# 2x 10 B Sidestepping GTB 5 steps x 4 reps Semi tandem balance B, one side with head turns  03/21/23 See HEP    PATIENT EDUCATION:  Education details: HEP UPDATE Person educated:  Patient Education method: Explanation, Demonstration, and Handouts Education comprehension: verbalized understanding and returned demonstration  HOME  EXERCISE PROGRAM: Access Code: [email or web address] URL: [email or web address] Date: 03/28/2023 Prepared by: Raynelle Fanning  Exercises - Hip Abduction with Resistance Loop  - 1 x daily - 7 x weekly - 3 sets - 10 reps - Hip Extension with Resistance Loop  - 1 x daily - 7 x weekly - 3 sets - 10 reps - Standing Hamstring Curl with Resistance  - 1 x daily - 7 x weekly - 3 sets - 10 reps - Marching with Resistance  - 1 x daily - 7 x weekly - 3 sets - 10 reps - Side Stepping with Resistance at Ankles and Counter Support  - 1 x daily - 3-4 x weekly - 1 sets - 10 reps  ASSESSMENT:  CLINICAL IMPRESSION: Nadine Counts tolerated TE well at first treatment visit. Resisted walking was most challenging and semi tandem stance. HEP progressed. Nadine Counts continues to demonstrate potential for improvement and would benefit from continued skilled therapy to address impairments.    OBJECTIVE IMPAIRMENTS: decreased activity tolerance, difficulty walking, and decreased strength.   ACTIVITY LIMITATIONS: locomotion level  PARTICIPATION LIMITATIONS: community activity and occupation  PERSONAL FACTORS: Age and Time since onset of injury/illness/exacerbation are also affecting patient's functional outcome.   REHAB POTENTIAL: Good  CLINICAL DECISION MAKING: Evolving/moderate complexity  EVALUATION COMPLEXITY: Moderate   GOALS: Goals reviewed with patient? Yes  SHORT TERM GOALS: Target date: 04/04/2023   Pt will be independent with initial HEP Baseline: Goal status: INITIAL   LONG TERM GOALS: Target date: 05/02/2023    Pt will be independent with advanced HEP Baseline:  Goal status: INITIAL  2.  Pt will improve bilat LE strength to 4+/5 to improve standing and walking tolerance Baseline:  Goal status: INITIAL  3.  Pt will be able to step up 6'' step with Lt LE  without UE support Baseline:  Goal status: INITIAL  4.  Pt will perform SLS >= 10 sec bilat Baseline:  Goal status: INITIAL    PLAN:  PT FREQUENCY: 2x/week  PT DURATION: 6 weeks  PLANNED INTERVENTIONS: Therapeutic exercises, Therapeutic activity, Neuromuscular re-education, Balance training, Gait training, Patient/Family education, Self Care, Joint mobilization, Stair training, Aquatic Therapy, Dry Needling, Electrical stimulation, Cryotherapy, Moist heat, Taping, Ultrasound, Ionotophoresis 4mg /ml Dexamethasone, Manual therapy, and Re-evaluation  PLAN FOR NEXT SESSION: assess response to HEP, resisted walking, balance, knee, hip, ankle strength   Solon Palm, PT  03/28/2023, 8:45 AM   PHYSICAL THERAPY DISCHARGE SUMMARY  Visits from Start of Care: 2  Current functional level related to goals / functional outcomes: Unable to assess as pt did not return for f/u visits.    Remaining deficits: Unable to assess as pt did not return for f/u visits.    Education / Equipment: HEP   Patient agrees to discharge. Patient goals were not met. Patient is being discharged due to not returning since the last visit.   Solon Palm, PT 01/05/24 9:57 AM  [facility] at [facility] [address] [place], [place] [address]  [phone] (office) [phone] (fax)

## 2023-03-28 ENCOUNTER — Encounter: Payer: Self-pay | Admitting: Physical Therapy

## 2023-03-28 ENCOUNTER — Ambulatory Visit: Payer: PPO | Admitting: Physical Therapy

## 2023-03-28 DIAGNOSIS — R262 Difficulty in walking, not elsewhere classified: Secondary | ICD-10-CM

## 2023-03-28 DIAGNOSIS — M6281 Muscle weakness (generalized): Secondary | ICD-10-CM

## 2023-04-01 ENCOUNTER — Telehealth: Payer: Self-pay | Admitting: Family Medicine

## 2023-04-01 NOTE — Telephone Encounter (Signed)
Made in error

## 2023-04-04 ENCOUNTER — Encounter: Payer: Self-pay | Admitting: Sports Medicine

## 2023-04-04 ENCOUNTER — Ambulatory Visit: Payer: PPO | Admitting: Rehabilitative and Restorative Service Providers"

## 2023-04-04 ENCOUNTER — Telehealth: Payer: Self-pay | Admitting: Sports Medicine

## 2023-04-04 ENCOUNTER — Ambulatory Visit (INDEPENDENT_AMBULATORY_CARE_PROVIDER_SITE_OTHER): Payer: PPO | Admitting: Sports Medicine

## 2023-04-04 DIAGNOSIS — R21 Rash and other nonspecific skin eruption: Secondary | ICD-10-CM | POA: Diagnosis not present

## 2023-04-04 DIAGNOSIS — M48061 Spinal stenosis, lumbar region without neurogenic claudication: Secondary | ICD-10-CM

## 2023-04-04 DIAGNOSIS — M792 Neuralgia and neuritis, unspecified: Secondary | ICD-10-CM | POA: Diagnosis not present

## 2023-04-04 DIAGNOSIS — M170 Bilateral primary osteoarthritis of knee: Secondary | ICD-10-CM

## 2023-04-04 DIAGNOSIS — B351 Tinea unguium: Secondary | ICD-10-CM | POA: Diagnosis not present

## 2023-04-04 DIAGNOSIS — L603 Nail dystrophy: Secondary | ICD-10-CM | POA: Diagnosis not present

## 2023-04-04 DIAGNOSIS — I739 Peripheral vascular disease, unspecified: Secondary | ICD-10-CM | POA: Diagnosis not present

## 2023-04-04 MED ORDER — PREGABALIN 25 MG PO CAPS
ORAL_CAPSULE | ORAL | 3 refills | Status: DC
Start: 2023-04-04 — End: 2023-04-25

## 2023-04-04 NOTE — Assessment & Plan Note (Addendum)
Nadine Counts also has a history of an L3-L5 fusion/decompression, he has at this point failed 5 days of steroids, 6 weeks of physical therapy, he did not tolerate Lyrica 75 mg, x-rays did show degenerative changes but were nondiagnostic. He did have an MRI last year, we will go and set him up for an epidural. The MRI did show multiple areas of spondylosis, severe spinal stenosis L1-L2, there is also effacement and mass effect on the right descending S1 nerve roots from a disc herniation at L5-S1. We will start with an L1-L2 interlaminar epidural. We will also drop him down to 25 mg of Lyrica nightly to be taken up to twice daily to 3 times daily if desired. He would also like to try some CBD Gummies.

## 2023-04-04 NOTE — Telephone Encounter (Signed)
[employer] approval please, left knee, x-ray confirmed osteoarthritis and failed everything including 6 weeks of physical therapy, medications, injections.

## 2023-04-04 NOTE — Assessment & Plan Note (Signed)
This pleasant 89 year old male returns, we injected both of his knees at the end of May, right knee is doing well, left knee still having some pain, we will go and get him approved for viscosupplementation on his left knee, he has failed greater than 6 weeks of physical therapy, steroid injections, analgesics.

## 2023-04-04 NOTE — Progress Notes (Signed)
    Procedures performed today:    None.  Independent interpretation of notes and tests performed by another provider:   None.  Brief History, Exam, Impression, and Recommendations:    Primary osteoarthritis of both knees This pleasant 89 year old male returns, we injected both of his knees at the end of May, right knee is doing well, left knee still having some pain, we will go and get him approved for viscosupplementation on his left knee, he has failed greater than 6 weeks of physical therapy, steroid injections, analgesics.   Lumbar spinal stenosis Nadine Counts also has a history of an L3-L5 fusion/decompression, he has at this point failed 5 days of steroids, 6 weeks of physical therapy, he did not tolerate Lyrica 75 mg, x-rays did show degenerative changes but were nondiagnostic. He did have an MRI last year, we will go and set him up for an epidural. The MRI did show multiple areas of spondylosis, severe spinal stenosis L1-L2, there is also effacement and mass effect on the right descending S1 nerve roots from a disc herniation at L5-S1. We will start with an L1-L2 interlaminar epidural. We will also drop him down to 25 mg of Lyrica nightly to be taken up to twice daily to 3 times daily if desired. He would also like to try some CBD Gummies.    ____________________________________________ Glenn Ortega. Benjamin Stain, M.D., ABFM., CAQSM., AME. Primary Care and Sports Medicine [facility]  Adjunct Professor of Family Medicine  [employer]  Restaurant manager, fast food

## 2023-04-05 NOTE — Telephone Encounter (Signed)
PA information submitted via [email or web address] for Orthovisc Paperwork has been printed and given to Dr. T for signatures. Once obtained, information will be faxed to [employer] at [phone]  

## 2023-04-06 ENCOUNTER — Telehealth: Payer: Self-pay | Admitting: *Deleted

## 2023-04-06 NOTE — Telephone Encounter (Signed)
   Pre-operative Risk Assessment    Patient Name: Glenn Ortega  DOB: 19-Feb-1934 MRN: [id number]      Request for Surgical Clearance    Procedure:   LUMBAR EPIDURAL  Date of Surgery:  Clearance TBD                                 Surgeon:   Surgeon's Group or Practice Name:  [facility] Phone number:  [phone] Fax number:  [phone]   Type of Clearance Requested:   - Pharmacy:  Hold Apixaban (Eliquis) PER NOTTE, 4 DOSES IT STATES   Type of Anesthesia:  Not Indicated   Additional requests/questions:    Wilhemina Cash   04/06/2023, 1:58 PM

## 2023-04-06 NOTE — Telephone Encounter (Signed)
Patient with diagnosis of afib on Eliquis for anticoagulation.    Procedure: lumbar epidural Date of procedure: TBD   CHA2DS2-VASc Score = 3   This indicates a 3.2% annual risk of stroke. The patient's score is based upon: CHF History: 0 HTN History: 1 Diabetes History: 0 Stroke History: 0 Vascular Disease History: 0 Age Score: 2 Gender Score: 0      CrCl 89ml/min  Per office protocol, patient can hold Eliquis for 3 days prior to procedure.    **This guidance is not considered finalized until pre-operative APP has relayed final recommendations.**

## 2023-04-06 NOTE — Telephone Encounter (Signed)
Benefits Investigation Details received from [employer] Injection: Orthovisc May fill through: Buy and Bill [facility] Specialty Pharmacy [facility] Copay/Coinsurance: 0% Product Copay: 20% Administration Coinsurance: 0% Administration Copay: $20 Out of Pocket Max: $3200 (met: $423.40)    Called and notified patient he states to expensive right now and will do them in the future.

## 2023-04-06 NOTE — Telephone Encounter (Signed)
   Patient Name: Glenn Ortega  DOB: 1934/09/29 MRN: [id number]  Primary Cardiologist: Nicki Guadalajara, MD  Clinical pharmacists have reviewed the patient's past medical history, labs, and current medications as part of preoperative protocol coverage. The following recommendations have been made:   Patient with diagnosis of afib on Eliquis for anticoagulation.     Procedure: lumbar epidural Date of procedure: TBD     CHA2DS2-VASc Score = 3   This indicates a 3.2% annual risk of stroke. The patient's score is based upon: CHF History: 0 HTN History: 1 Diabetes History: 0 Stroke History: 0 Vascular Disease History: 0 Age Score: 2 Gender Score: 0       CrCl 76ml/min   Per office protocol, patient can hold Eliquis for 3 days prior to procedure. Please resume Eliquis as soon as possible postprocedure, at the discretion of the surgeon.   I will route this recommendation to the requesting party via Epic fax function and remove from pre-op pool.  Please call with questions.  Joylene Grapes, NP 04/06/2023, 5:05 PM

## 2023-04-08 NOTE — Discharge Instructions (Addendum)
Post Procedure Spinal Discharge Instruction Sheet  You may resume a regular diet and any medications that you routinely take (including pain medications) unless otherwise noted by MD.  No driving day of procedure.  Light activity throughout the rest of the day.  Do not do any strenuous work, exercise, bending or lifting.  The day following the procedure, you can resume normal physical activity but you should refrain from exercising or physical therapy for at least three days thereafter.  You may apply ice to the injection site, 20 minutes on, 20 minutes off, as needed. Do not apply ice directly to skin.    Common Side Effects:  Headaches- take your usual medications as directed by your physician.  Increase your fluid intake.  Caffeinated beverages may be helpful.  Lie flat in bed until your headache resolves.  Restlessness or inability to sleep- you may have trouble sleeping for the next few days.  Ask your referring physician if you need any medication for sleep.  Facial flushing or redness- should subside within a few days.  Increased pain- a temporary increase in pain a day or two following your procedure is not unusual.  Take your pain medication as prescribed by your referring physician.  Leg cramps  Please contact our office at [phone] for the following symptoms: Fever greater than 100 degrees. Headaches unresolved with medication after 2-3 days. Increased swelling, pain, or redness at injection site.   Thank you for visiting [facility] today.   You may resume your Eliquis 24 hours after procedure

## 2023-04-09 ENCOUNTER — Other Ambulatory Visit: Payer: Self-pay | Admitting: Cardiology

## 2023-04-09 DIAGNOSIS — I4891 Unspecified atrial fibrillation: Secondary | ICD-10-CM

## 2023-04-11 ENCOUNTER — Ambulatory Visit: Payer: PPO | Admitting: Sports Medicine

## 2023-04-11 ENCOUNTER — Ambulatory Visit
Admission: RE | Admit: 2023-04-11 | Discharge: 2023-04-11 | Disposition: A | Discharge status: Home / Self Care | Payer: PPO | Source: Ambulatory Visit | Attending: Sports Medicine | Admitting: Sports Medicine

## 2023-04-11 ENCOUNTER — Encounter: Payer: PPO | Admitting: Rehabilitative and Restorative Service Providers"

## 2023-04-11 DIAGNOSIS — M48061 Spinal stenosis, lumbar region without neurogenic claudication: Secondary | ICD-10-CM

## 2023-04-11 DIAGNOSIS — M4727 Other spondylosis with radiculopathy, lumbosacral region: Secondary | ICD-10-CM | POA: Diagnosis not present

## 2023-04-11 MED ORDER — METHYLPREDNISOLONE ACETATE 40 MG/ML INJ SUSP (RADIOLOG
80.0000 mg | Freq: Once | INTRAMUSCULAR | Status: AC
  Administered 2023-04-11: 80 mg via EPIDURAL

## 2023-04-11 MED ORDER — IOPAMIDOL (ISOVUE-M 200) INJECTION 41%
1.0000 mL | Freq: Once | INTRAMUSCULAR | Status: AC
  Administered 2023-04-11: 1 mL via EPIDURAL

## 2023-04-11 NOTE — Telephone Encounter (Signed)
Eliquis 5mg  refill request received. Patient is 89 years old, weight-94.3kg, Crea-1.03 on 03/07/23, Diagnosis-Afib, and last seen by Robet Leu, NP on 02/14/23. Dose is appropriate based on dosing criteria. Will send in refill to requested pharmacy.

## 2023-04-22 ENCOUNTER — Emergency Department (HOSPITAL_COMMUNITY): Payer: PPO

## 2023-04-22 ENCOUNTER — Emergency Department (HOSPITAL_BASED_OUTPATIENT_CLINIC_OR_DEPARTMENT_OTHER)
Admission: EM | Admit: 2023-04-22 | Discharge: 2023-04-22 | Disposition: A | Discharge status: Home / Self Care | Payer: PPO | Source: Home / Self Care | Attending: Emergency Medicine | Admitting: Emergency Medicine

## 2023-04-22 ENCOUNTER — Emergency Department (HOSPITAL_BASED_OUTPATIENT_CLINIC_OR_DEPARTMENT_OTHER): Payer: PPO

## 2023-04-22 ENCOUNTER — Telehealth: Payer: Self-pay

## 2023-04-22 ENCOUNTER — Inpatient Hospital Stay (HOSPITAL_COMMUNITY): Payer: PPO

## 2023-04-22 ENCOUNTER — Other Ambulatory Visit: Payer: Self-pay

## 2023-04-22 ENCOUNTER — Inpatient Hospital Stay (HOSPITAL_COMMUNITY)
Admission: EM | Admit: 2023-04-22 | Discharge: 2023-05-19 | DRG: 085 | Disposition: E | Payer: PPO | Attending: Pulmonary Disease | Admitting: Pulmonary Disease

## 2023-04-22 ENCOUNTER — Observation Stay (HOSPITAL_COMMUNITY): Payer: PPO

## 2023-04-22 ENCOUNTER — Encounter (HOSPITAL_BASED_OUTPATIENT_CLINIC_OR_DEPARTMENT_OTHER): Payer: Self-pay

## 2023-04-22 DIAGNOSIS — S0003XA Contusion of scalp, initial encounter: Secondary | ICD-10-CM | POA: Insufficient documentation

## 2023-04-22 DIAGNOSIS — Z515 Encounter for palliative care: Secondary | ICD-10-CM | POA: Diagnosis not present

## 2023-04-22 DIAGNOSIS — S066X1A Traumatic subarachnoid hemorrhage with loss of consciousness of 30 minutes or less, initial encounter: Principal | ICD-10-CM

## 2023-04-22 DIAGNOSIS — S06320A Contusion and laceration of left cerebrum without loss of consciousness, initial encounter: Secondary | ICD-10-CM | POA: Diagnosis not present

## 2023-04-22 DIAGNOSIS — S066X9A Traumatic subarachnoid hemorrhage with loss of consciousness of unspecified duration, initial encounter: Secondary | ICD-10-CM

## 2023-04-22 DIAGNOSIS — S065X1A Traumatic subdural hemorrhage with loss of consciousness of 30 minutes or less, initial encounter: Secondary | ICD-10-CM | POA: Diagnosis not present

## 2023-04-22 DIAGNOSIS — E039 Hypothyroidism, unspecified: Secondary | ICD-10-CM | POA: Diagnosis not present

## 2023-04-22 DIAGNOSIS — R402142 Coma scale, eyes open, spontaneous, at arrival to emergency department: Secondary | ICD-10-CM | POA: Diagnosis not present

## 2023-04-22 DIAGNOSIS — G4733 Obstructive sleep apnea (adult) (pediatric): Secondary | ICD-10-CM | POA: Diagnosis present

## 2023-04-22 DIAGNOSIS — E669 Obesity, unspecified: Secondary | ICD-10-CM | POA: Diagnosis present

## 2023-04-22 DIAGNOSIS — R110 Nausea: Secondary | ICD-10-CM | POA: Diagnosis not present

## 2023-04-22 DIAGNOSIS — R569 Unspecified convulsions: Secondary | ICD-10-CM | POA: Diagnosis not present

## 2023-04-22 DIAGNOSIS — I609 Nontraumatic subarachnoid hemorrhage, unspecified: Secondary | ICD-10-CM

## 2023-04-22 DIAGNOSIS — G629 Polyneuropathy, unspecified: Secondary | ICD-10-CM | POA: Diagnosis not present

## 2023-04-22 DIAGNOSIS — R066 Hiccough: Secondary | ICD-10-CM

## 2023-04-22 DIAGNOSIS — I10 Essential (primary) hypertension: Secondary | ICD-10-CM

## 2023-04-22 DIAGNOSIS — R4182 Altered mental status, unspecified: Secondary | ICD-10-CM | POA: Diagnosis not present

## 2023-04-22 DIAGNOSIS — Z8249 Family history of ischemic heart disease and other diseases of the circulatory system: Secondary | ICD-10-CM

## 2023-04-22 DIAGNOSIS — Z888 Allergy status to other drugs, medicaments and biological substances status: Secondary | ICD-10-CM | POA: Diagnosis not present

## 2023-04-22 DIAGNOSIS — Z809 Family history of malignant neoplasm, unspecified: Secondary | ICD-10-CM

## 2023-04-22 DIAGNOSIS — J984 Other disorders of lung: Secondary | ICD-10-CM | POA: Diagnosis not present

## 2023-04-22 DIAGNOSIS — W228XXA Striking against or struck by other objects, initial encounter: Secondary | ICD-10-CM | POA: Insufficient documentation

## 2023-04-22 DIAGNOSIS — R55 Syncope and collapse: Secondary | ICD-10-CM

## 2023-04-22 DIAGNOSIS — Z7901 Long term (current) use of anticoagulants: Secondary | ICD-10-CM | POA: Diagnosis not present

## 2023-04-22 DIAGNOSIS — S06A0XA Traumatic brain compression without herniation, initial encounter: Secondary | ICD-10-CM | POA: Diagnosis present

## 2023-04-22 DIAGNOSIS — K920 Hematemesis: Secondary | ICD-10-CM | POA: Diagnosis not present

## 2023-04-22 DIAGNOSIS — Z452 Encounter for adjustment and management of vascular access device: Secondary | ICD-10-CM | POA: Diagnosis not present

## 2023-04-22 DIAGNOSIS — F0781 Postconcussional syndrome: Secondary | ICD-10-CM | POA: Insufficient documentation

## 2023-04-22 DIAGNOSIS — R402242 Coma scale, best verbal response, confused conversation, at arrival to emergency department: Secondary | ICD-10-CM | POA: Diagnosis not present

## 2023-04-22 DIAGNOSIS — N400 Enlarged prostate without lower urinary tract symptoms: Secondary | ICD-10-CM | POA: Diagnosis present

## 2023-04-22 DIAGNOSIS — I480 Paroxysmal atrial fibrillation: Secondary | ICD-10-CM

## 2023-04-22 DIAGNOSIS — Y92001 Dining room of unspecified non-institutional (private) residence as the place of occurrence of the external cause: Secondary | ICD-10-CM | POA: Insufficient documentation

## 2023-04-22 DIAGNOSIS — S0990XA Unspecified injury of head, initial encounter: Secondary | ICD-10-CM

## 2023-04-22 DIAGNOSIS — Z6838 Body mass index (BMI) 38.0-38.9, adult: Secondary | ICD-10-CM | POA: Diagnosis not present

## 2023-04-22 DIAGNOSIS — Z880 Allergy status to penicillin: Secondary | ICD-10-CM

## 2023-04-22 DIAGNOSIS — R402362 Coma scale, best motor response, obeys commands, at arrival to emergency department: Secondary | ICD-10-CM | POA: Diagnosis present

## 2023-04-22 DIAGNOSIS — K219 Gastro-esophageal reflux disease without esophagitis: Secondary | ICD-10-CM | POA: Diagnosis not present

## 2023-04-22 DIAGNOSIS — S065XAA Traumatic subdural hemorrhage with loss of consciousness status unknown, initial encounter: Secondary | ICD-10-CM | POA: Diagnosis not present

## 2023-04-22 DIAGNOSIS — Z79890 Hormone replacement therapy (postmenopausal): Secondary | ICD-10-CM | POA: Diagnosis not present

## 2023-04-22 DIAGNOSIS — G4730 Sleep apnea, unspecified: Secondary | ICD-10-CM | POA: Diagnosis present

## 2023-04-22 DIAGNOSIS — Z79899 Other long term (current) drug therapy: Secondary | ICD-10-CM | POA: Insufficient documentation

## 2023-04-22 DIAGNOSIS — R0989 Other specified symptoms and signs involving the circulatory and respiratory systems: Secondary | ICD-10-CM | POA: Diagnosis not present

## 2023-04-22 DIAGNOSIS — R1111 Vomiting without nausea: Secondary | ICD-10-CM | POA: Diagnosis not present

## 2023-04-22 DIAGNOSIS — E782 Mixed hyperlipidemia: Secondary | ICD-10-CM | POA: Diagnosis not present

## 2023-04-22 DIAGNOSIS — I700 Atherosclerosis of aorta: Secondary | ICD-10-CM | POA: Diagnosis not present

## 2023-04-22 DIAGNOSIS — G9389 Other specified disorders of brain: Secondary | ICD-10-CM | POA: Diagnosis not present

## 2023-04-22 DIAGNOSIS — S065X9A Traumatic subdural hemorrhage with loss of consciousness of unspecified duration, initial encounter: Secondary | ICD-10-CM | POA: Diagnosis not present

## 2023-04-22 DIAGNOSIS — I517 Cardiomegaly: Secondary | ICD-10-CM | POA: Diagnosis not present

## 2023-04-22 DIAGNOSIS — S0219XA Other fracture of base of skull, initial encounter for closed fracture: Secondary | ICD-10-CM | POA: Diagnosis present

## 2023-04-22 DIAGNOSIS — S199XXA Unspecified injury of neck, initial encounter: Secondary | ICD-10-CM | POA: Diagnosis not present

## 2023-04-22 DIAGNOSIS — K760 Fatty (change of) liver, not elsewhere classified: Secondary | ICD-10-CM | POA: Diagnosis not present

## 2023-04-22 DIAGNOSIS — Z66 Do not resuscitate: Secondary | ICD-10-CM | POA: Diagnosis not present

## 2023-04-22 DIAGNOSIS — R0902 Hypoxemia: Secondary | ICD-10-CM | POA: Diagnosis not present

## 2023-04-22 DIAGNOSIS — S06310A Contusion and laceration of right cerebrum without loss of consciousness, initial encounter: Secondary | ICD-10-CM | POA: Diagnosis not present

## 2023-04-22 DIAGNOSIS — I6200 Nontraumatic subdural hemorrhage, unspecified: Secondary | ICD-10-CM | POA: Diagnosis not present

## 2023-04-22 DIAGNOSIS — W19XXXA Unspecified fall, initial encounter: Secondary | ICD-10-CM | POA: Diagnosis not present

## 2023-04-22 DIAGNOSIS — R918 Other nonspecific abnormal finding of lung field: Secondary | ICD-10-CM | POA: Diagnosis not present

## 2023-04-22 DIAGNOSIS — S066X0A Traumatic subarachnoid hemorrhage without loss of consciousness, initial encounter: Secondary | ICD-10-CM | POA: Diagnosis not present

## 2023-04-22 LAB — CBC WITH DIFFERENTIAL/PLATELET
Abs Immature Granulocytes: 0.05 10*3/uL (ref 0.00–0.07)
Abs Immature Granulocytes: 0.09 10*3/uL — ABNORMAL HIGH (ref 0.00–0.07)
Basophils Absolute: 0 10*3/uL (ref 0.0–0.1)
Basophils Absolute: 0 10*3/uL (ref 0.0–0.1)
Basophils Relative: 0 %
Basophils Relative: 0 %
Eosinophils Absolute: 0.2 10*3/uL (ref 0.0–0.5)
Eosinophils Absolute: 0.1 10*3/uL (ref 0.0–0.5)
Eosinophils Relative: 2 %
Eosinophils Relative: 1 %
HCT: 43.3 % (ref 39.0–52.0)
HCT: 43.3 % (ref 39.0–52.0)
Hemoglobin: 14.3 g/dL (ref 13.0–17.0)
Hemoglobin: 14.4 g/dL (ref 13.0–17.0)
Immature Granulocytes: 1 %
Immature Granulocytes: 1 %
Lymphocytes Relative: 18 %
Lymphocytes Relative: 21 %
Lymphs Abs: 1.5 10*3/uL (ref 0.7–4.0)
Lymphs Abs: 2.1 10*3/uL (ref 0.7–4.0)
MCH: 28.3 pg (ref 26.0–34.0)
MCH: 29.8 pg (ref 26.0–34.0)
MCHC: 33 g/dL (ref 30.0–36.0)
MCHC: 33.3 g/dL (ref 30.0–36.0)
MCV: 85.6 fL (ref 80.0–100.0)
MCV: 89.5 fL (ref 80.0–100.0)
Monocytes Absolute: 0.8 10*3/uL (ref 0.1–1.0)
Monocytes Absolute: 0.8 10*3/uL (ref 0.1–1.0)
Monocytes Relative: 10 %
Monocytes Relative: 8 %
Neutro Abs: 5.7 10*3/uL (ref 1.7–7.7)
Neutro Abs: 6.6 10*3/uL (ref 1.7–7.7)
Neutrophils Relative %: 69 %
Neutrophils Relative %: 69 %
Platelets: 163 10*3/uL (ref 150–400)
Platelets: 157 10*3/uL (ref 150–400)
RBC: 5.06 MIL/uL (ref 4.22–5.81)
RBC: 4.84 MIL/uL (ref 4.22–5.81)
RDW: 15.9 % — ABNORMAL HIGH (ref 11.5–15.5)
RDW: 15.6 % — ABNORMAL HIGH (ref 11.5–15.5)
WBC: 8.3 10*3/uL (ref 4.0–10.5)
WBC: 9.8 10*3/uL (ref 4.0–10.5)
nRBC: 0 % (ref 0.0–0.2)
nRBC: 0 % (ref 0.0–0.2)

## 2023-04-22 LAB — LACTIC ACID, PLASMA
Lactic Acid, Venous: 1.9 mmol/L (ref 0.5–1.9)
Lactic Acid, Venous: 2.5 mmol/L (ref 0.5–1.9)

## 2023-04-22 LAB — TROPONIN I (HIGH SENSITIVITY)
Troponin I (High Sensitivity): 15 ng/L (ref <18)
Troponin I (High Sensitivity): 16 ng/L (ref <18)
Troponin I (High Sensitivity): 20 ng/L — ABNORMAL HIGH (ref <18)

## 2023-04-22 LAB — COMPREHENSIVE METABOLIC PANEL
ALT: 12 U/L (ref 0–44)
ALT: 16 U/L (ref 0–44)
AST: 23 U/L (ref 15–41)
AST: 26 U/L (ref 15–41)
Albumin: 3.9 g/dL (ref 3.5–5.0)
Albumin: 3.7 g/dL (ref 3.5–5.0)
Alkaline Phosphatase: 44 U/L (ref 38–126)
Alkaline Phosphatase: 44 U/L (ref 38–126)
Anion gap: 10 (ref 5–15)
Anion gap: 12 (ref 5–15)
BUN: 17 mg/dL (ref 8–23)
BUN: 15 mg/dL (ref 8–23)
CO2: 22 mmol/L (ref 22–32)
CO2: 19 mmol/L — ABNORMAL LOW (ref 22–32)
Calcium: 8.5 mg/dL — ABNORMAL LOW (ref 8.9–10.3)
Calcium: 8.5 mg/dL — ABNORMAL LOW (ref 8.9–10.3)
Chloride: 107 mmol/L (ref 98–111)
Chloride: 109 mmol/L (ref 98–111)
Creatinine, Ser: 0.96 mg/dL (ref 0.61–1.24)
Creatinine, Ser: 1.18 mg/dL (ref 0.61–1.24)
GFR, Estimated: >60 mL/min (ref >60)
GFR, Estimated: >60 mL/min (ref >60)
Glucose, Bld: 94 mg/dL (ref 70–99)
Glucose, Bld: 113 mg/dL — ABNORMAL HIGH (ref 70–99)
Potassium: 4.4 mmol/L (ref 3.5–5.1)
Potassium: 4 mmol/L (ref 3.5–5.1)
Sodium: 139 mmol/L (ref 135–145)
Sodium: 140 mmol/L (ref 135–145)
Total Bilirubin: 1.6 mg/dL — ABNORMAL HIGH (ref 0.3–1.2)
Total Bilirubin: 1.6 mg/dL — ABNORMAL HIGH (ref 0.3–1.2)
Total Protein: 7.2 g/dL (ref 6.5–8.1)
Total Protein: 6.7 g/dL (ref 6.5–8.1)

## 2023-04-22 LAB — GLUCOSE, CAPILLARY
Glucose-Capillary: 137 mg/dL — ABNORMAL HIGH (ref 70–99)
Glucose-Capillary: 137 mg/dL — ABNORMAL HIGH (ref 70–99)

## 2023-04-22 LAB — PROTIME-INR
INR: 1.4 — ABNORMAL HIGH (ref 0.8–1.2)
Prothrombin Time: 17.7 seconds — ABNORMAL HIGH (ref 11.4–15.2)

## 2023-04-22 LAB — CBG MONITORING, ED: Glucose-Capillary: 133 mg/dL — ABNORMAL HIGH (ref 70–99)

## 2023-04-22 LAB — LIPASE, BLOOD: Lipase: 45 U/L (ref 11–51)

## 2023-04-22 LAB — MAGNESIUM: Magnesium: 1.9 mg/dL (ref 1.7–2.4)

## 2023-04-22 LAB — HEPARIN LEVEL (UNFRACTIONATED): Heparin Unfractionated: >1.1 IU/mL — ABNORMAL HIGH (ref 0.30–0.70)

## 2023-04-22 LAB — MRSA NEXT GEN BY PCR, NASAL: MRSA by PCR Next Gen: NOT DETECTED

## 2023-04-22 MED ORDER — LABETALOL HCL 5 MG/ML IV SOLN
10.0000 mg | INTRAVENOUS | Status: DC | PRN
  Administered 2023-04-22 – 2023-04-24 (×7): 10 mg via INTRAVENOUS
  Filled 2023-04-22 (×4): qty 4

## 2023-04-22 MED ORDER — METOPROLOL SUCCINATE ER 25 MG PO TB24: 12.5000 mg | ORAL_TABLET | Freq: Every day | ORAL | Status: DC

## 2023-04-22 MED ORDER — PANTOPRAZOLE SODIUM 40 MG IV SOLR
40.0000 mg | INTRAVENOUS | Status: DC
  Administered 2023-04-22 – 2023-04-24 (×3): 40 mg via INTRAVENOUS
  Filled 2023-04-22 (×3): qty 10

## 2023-04-22 MED ORDER — ATORVASTATIN CALCIUM 10 MG PO TABS: 10.0000 mg | ORAL_TABLET | Freq: Every day | ORAL | Status: DC

## 2023-04-22 MED ORDER — IOHEXOL 350 MG/ML SOLN
75.0000 mL | Freq: Once | INTRAVENOUS | Status: AC | PRN
  Administered 2023-04-22: 75 mL via INTRAVENOUS

## 2023-04-22 MED ORDER — LABETALOL HCL 5 MG/ML IV SOLN
INTRAVENOUS | Status: AC
  Administered 2023-04-22: 20 mg
  Filled 2023-04-22: qty 4

## 2023-04-22 MED ORDER — LOSARTAN POTASSIUM 50 MG PO TABS: 25.0000 mg | ORAL_TABLET | Freq: Every day | ORAL | Status: DC

## 2023-04-22 MED ORDER — ACETAMINOPHEN 325 MG PO TABS: 650.0000 mg | ORAL_TABLET | Freq: Four times a day (QID) | ORAL | Status: DC | PRN

## 2023-04-22 MED ORDER — LATANOPROST 0.005 % OP SOLN
1.0000 [drp] | Freq: Every day | OPHTHALMIC | Status: DC
  Administered 2023-04-22 – 2023-04-23 (×2): 1 [drp] via OPHTHALMIC
  Filled 2023-04-22: qty 2.5

## 2023-04-22 MED ORDER — PANTOPRAZOLE SODIUM 40 MG PO TBEC: 40.0000 mg | DELAYED_RELEASE_TABLET | Freq: Every day | ORAL | Status: DC

## 2023-04-22 MED ORDER — SODIUM CHLORIDE 0.9 % IV BOLUS
500.0000 mL | Freq: Once | INTRAVENOUS | Status: AC
  Administered 2023-04-22: 500 mL via INTRAVENOUS

## 2023-04-22 MED ORDER — SODIUM CHLORIDE 0.9% FLUSH
3.0000 mL | Freq: Two times a day (BID) | INTRAVENOUS | Status: DC
  Administered 2023-04-22 – 2023-04-24 (×4): 3 mL via INTRAVENOUS

## 2023-04-22 MED ORDER — ORAL CARE MOUTH RINSE
15.0000 mL | OROMUCOSAL | Status: DC | PRN
  Administered 2023-04-23: 15 mL via OROMUCOSAL

## 2023-04-22 MED ORDER — CHLORHEXIDINE GLUCONATE CLOTH 2 % EX PADS
6.0000 | MEDICATED_PAD | Freq: Every day | CUTANEOUS | Status: DC
  Administered 2023-04-23 – 2023-04-24 (×2): 6 via TOPICAL

## 2023-04-22 MED ORDER — DOCUSATE SODIUM 100 MG PO CAPS: 100.0000 mg | ORAL_CAPSULE | Freq: Two times a day (BID) | ORAL | Status: DC | PRN

## 2023-04-22 MED ORDER — PANTOPRAZOLE SODIUM 40 MG PO TBEC: 40.0000 mg | DELAYED_RELEASE_TABLET | Freq: Every day | ORAL | 0 refills | Status: DC

## 2023-04-22 MED ORDER — EMPTY CONTAINERS FLEXIBLE MISC
900.0000 mg | Freq: Once | Status: AC
  Administered 2023-04-22: 900 mg via INTRAVENOUS
  Filled 2023-04-22: qty 90

## 2023-04-22 MED ORDER — PANTOPRAZOLE SODIUM 40 MG IV SOLR
40.0000 mg | Freq: Once | INTRAVENOUS | Status: AC
  Administered 2023-04-22: 40 mg via INTRAVENOUS
  Filled 2023-04-22: qty 10

## 2023-04-22 MED ORDER — POLYETHYLENE GLYCOL 3350 17 G PO PACK: 17.0000 g | PACK | Freq: Every day | ORAL | Status: DC | PRN

## 2023-04-22 MED ORDER — CHLORHEXIDINE GLUCONATE CLOTH 2 % EX PADS: 6.0000 | MEDICATED_PAD | Freq: Every day | CUTANEOUS | Status: DC

## 2023-04-22 MED ORDER — ONDANSETRON HCL 4 MG/2ML IJ SOLN
4.0000 mg | Freq: Once | INTRAMUSCULAR | Status: AC
  Administered 2023-04-22: 4 mg via INTRAVENOUS
  Filled 2023-04-22: qty 2

## 2023-04-22 MED ORDER — LEVETIRACETAM IN NACL 500 MG/100ML IV SOLN
500.0000 mg | Freq: Two times a day (BID) | INTRAVENOUS | Status: DC
  Administered 2023-04-22 – 2023-04-24 (×4): 500 mg via INTRAVENOUS
  Filled 2023-04-22 (×4): qty 100

## 2023-04-22 MED ORDER — ACETAMINOPHEN 650 MG RE SUPP
650.0000 mg | Freq: Four times a day (QID) | RECTAL | Status: DC | PRN
  Administered 2023-04-22 – 2023-04-23 (×2): 650 mg via RECTAL
  Filled 2023-04-22 (×2): qty 1

## 2023-04-22 MED ORDER — LEVETIRACETAM IN NACL 1000 MG/100ML IV SOLN
1000.0000 mg | Freq: Once | INTRAVENOUS | Status: AC
  Administered 2023-04-22: 1000 mg via INTRAVENOUS
  Filled 2023-04-22: qty 100

## 2023-04-22 MED ORDER — INSULIN ASPART 100 UNIT/ML IJ SOLN
0.0000 [IU] | INTRAMUSCULAR | Status: DC
  Administered 2023-04-22 – 2023-04-23 (×4): 1 [IU] via SUBCUTANEOUS
  Administered 2023-04-23: 2 [IU] via SUBCUTANEOUS
  Administered 2023-04-23 – 2023-04-24 (×2): 1 [IU] via SUBCUTANEOUS

## 2023-04-22 MED ORDER — LEVOTHYROXINE SODIUM 50 MCG PO TABS: 50.0000 ug | ORAL_TABLET | Freq: Every day | ORAL | Status: DC

## 2023-04-22 MED ORDER — ONDANSETRON 4 MG PO TBDP: 4.0000 mg | ORAL_TABLET | Freq: Three times a day (TID) | ORAL | 0 refills | Status: DC | PRN

## 2023-04-22 NOTE — ED Notes (Signed)
ED Provider at bedside. 

## 2023-04-22 NOTE — Discharge Instructions (Addendum)
You were seen in the emergency department for head injury after a probable fainting spell.  You had a CAT scan of your head that did not show any signs of bleeding.  You were also having hiccups.  We are starting you on some acid medication and nausea medication.  Please follow-up with your primary care doctor for further evaluation.  Return if any worsening or concerning symptoms.

## 2023-04-22 NOTE — Progress Notes (Signed)
Pharmacy Note - Andexanet alfa (Andexxa) For Reversal of Oral Anticoagulant   Patient known to take Apixaban 5 mg Twice Daily Last dose of above taken on 04/21/2023 at approximately 07:30-08:30 (less than 18 hrs ago). This is assumed since no one lives with family and patient's daughter reports a history of good compliance with home meds. Unable to confirm last dose.   *Would like to add labs including Heparin AntiXa  The above anti-Xa level may help determine if FXa inhibiting drug is present but will not determine how much.  This patient has been administered Coagulation Factor Xa (Recombinat), andexanet alfa (ANDEXXA)  The patient was ordered and given a dose of 900 mg which was administered Peripherally (ICD-10-PCS Code: LO75643)   Wilburn Cornelia, PharmD, BCPS Clinical Pharmacist 05/11/2023 1:59 PM  Please refer to AMION for pharmacy phone number

## 2023-04-22 NOTE — Progress Notes (Signed)
LTM EEG hooked up and running - no initial skin breakdown - push button tested - [facility] monitoring.  

## 2023-04-22 NOTE — Telephone Encounter (Signed)
Glenn Ortega had hiccups and then started vomiting. He then had a loss of consciousness and woke up on the floor. I advised him to have someone drive him to the ED for evaluation.

## 2023-04-22 NOTE — ED Provider Notes (Signed)
[place] EMERGENCY DEPARTMENT AT [facility] Provider Note   CSN: [id number] Arrival date & time: 04/30/2023  1234     History {Add pertinent medical, surgical, social history, OB history to HPI:1} Chief Complaint  Patient presents with   Fall on thinners    Glenn Ortega is a 89 y.o. male.  HPI    Patient is an 89 year old male, has a history of hypertension on losartan and metoprolol, he had actually presented to another hospital this morning after having some type of head injury, syncopal event, he had been having hiccups and feeling poorly, CT scan was negative and the patient had a reassuring exam he was discharged home.  Paramedics were called to that area today after that happened that he got home because he had another witnessed syncopal event in the kitchen where he was standing, was not feeling well, went to the ground and was unresponsive.  He was called out for a cardiac arrest but he was clearly having pulses breathing and responsive when they got there.  The patient denies chest pain but states he is nauseated, he does not know where he is.  Was evidence of hematoma to the posterior scalp but the patient does not remember falling   Home Medications Prior to Admission medications   Medication Sig Start Date End Date Taking? Authorizing Provider  acetaminophen (TYLENOL) 650 MG CR tablet Take 1 tablet (650 mg total) by mouth every 8 (eight) hours as needed for pain. 11/08/22   Monica Becton, MD  apixaban (ELIQUIS) 5 MG TABS tablet Take 1 tablet by mouth twice daily. 04/11/23   Jonita Albee, PA-C  atorvastatin (LIPITOR) 10 MG tablet Take 1 tablet (10 mg total) by mouth daily. 03/23/22   Nafziger, Kandee Keen, NP  furosemide (LASIX) 40 MG tablet Take 1 tablet (40 mg total) by mouth daily as needed. 02/14/23   Jonita Albee, PA-C  latanoprost (XALATAN) 0.005 % ophthalmic solution Place 1 drop into both eyes at bedtime. 03/04/23   [provider]   levothyroxine (SYNTHROID) 50 MCG tablet Take 1 tablet by mouth every morning. 12/03/22   Monica Becton, MD  losartan (COZAAR) 25 MG tablet Take 1 tablet by mouth daily. 03/21/23   Jonita Albee, PA-C  metoprolol succinate (TOPROL-XL) 25 MG 24 hr tablet Take 1/2 tablet by mouth daily. Take with or immediately following a meal. 12/03/22   Monica Becton, MD  Multiple Vitamins-Minerals (PRESERVISION AREDS 2 PO) Take 1 capsule by mouth 2 (two) times daily.    [provider]  ondansetron (ZOFRAN-ODT) 4 MG disintegrating tablet Take 1 tablet (4 mg total) by mouth every 8 (eight) hours as needed for nausea or vomiting. 05/07/2023   Terrilee Files, MD  pantoprazole (PROTONIX) 40 MG tablet Take 1 tablet (40 mg total) by mouth daily. 04/28/2023   Terrilee Files, MD  pregabalin (LYRICA) 25 MG capsule 1 capsule p.o. nightly for a week then twice daily for a week then 3 times daily if needed 04/04/23   Monica Becton, MD      Allergies    Penicillins, Gabapentin, Lisinopril, and Penicillin g    Review of Systems   Review of Systems  All other systems reviewed and are negative.   Physical Exam Updated Vital Signs BP (S) 126/78   Temp (S) (!) 97.5 F (36.4 C) (Oral)   Resp (S) 20   Ht 1.702 m (5\' 7" )   Wt 92.1 kg  SpO2 (S) 97%   BMI 31.79 kg/m  Physical Exam Vitals and nursing note reviewed.  Constitutional:      General: He is not in acute distress.    Appearance: He is well-developed.  HENT:     Head: Normocephalic.     Comments: Posterior scalp hematoma    Mouth/Throat:     Pharynx: No oropharyngeal exudate.  Eyes:     General: No scleral icterus.       Right eye: No discharge.        Left eye: No discharge.     Conjunctiva/sclera: Conjunctivae normal.     Pupils: Pupils are equal, round, and reactive to light.  Neck:     Thyroid: No thyromegaly.     Vascular: No JVD.  Cardiovascular:     Rate and Rhythm: Normal rate and regular rhythm.      Heart sounds: Normal heart sounds. No murmur heard.    No friction rub. No gallop.  Pulmonary:     Effort: Pulmonary effort is normal. No respiratory distress.     Breath sounds: Normal breath sounds. No wheezing or rales.  Abdominal:     General: Bowel sounds are normal. There is no distension.     Palpations: Abdomen is soft. There is no mass.     Tenderness: There is no abdominal tenderness.  Musculoskeletal:        General: No tenderness. Normal range of motion.     Cervical back: Normal range of motion and neck supple.     Right lower leg: No edema.     Left lower leg: No edema.  Lymphadenopathy:     Cervical: No cervical adenopathy.  Skin:    General: Skin is warm and dry.     Findings: No erythema or rash.  Neurological:     Mental Status: He is alert.     Comments: The patient has no pronator drift, able to lift both arms and both legs, seems confused, does not know where he is, he does know his name and his date of birth, he does not know the date today.  He does appear mildly somnolent but is easily arousable  Psychiatric:        Behavior: Behavior normal.     ED Results / Procedures / Treatments   Labs (all labs ordered are listed, but only abnormal results are displayed) Labs Reviewed  CBC WITH DIFFERENTIAL/PLATELET - Abnormal; Notable for the following components:      Result Value   RDW 15.6 (*)    Abs Immature Granulocytes 0.09 (*)    All other components within normal limits  COMPREHENSIVE METABOLIC PANEL - Abnormal; Notable for the following components:   CO2 19 (*)    Glucose, Bld 113 (*)    Calcium 8.5 (*)    Total Bilirubin 1.6 (*)    All other components within normal limits  PROTIME-INR - Abnormal; Notable for the following components:   Prothrombin Time 17.7 (*)    INR 1.4 (*)    All other components within normal limits  LACTIC ACID, PLASMA  MAGNESIUM  LACTIC ACID, PLASMA  TROPONIN I (HIGH SENSITIVITY)    EKG EKG  Interpretation Date/Time:  Friday April 22 2023 12:39:36 EDT Ventricular Rate:  69 PR Interval:  163 QRS Duration:  102 QT Interval:  424 QTC Calculation: 455 R Axis:   -2  Text Interpretation: Sinus rhythm Paired ventricular premature complexes Low voltage, extremity and precordial leads Minimal ST depression, inferior leads  Baseline wander in lead(s) V6 Confirmed by Eber Hong [phone]) on 04/21/2023 12:53:39 PM  Radiology DG Chest Port 1 View  Result Date: 05/16/2023 CLINICAL DATA:  Altered mental status EXAM: PORTABLE CHEST 1 VIEW COMPARISON:  05/08/2023 FINDINGS: No consolidation, pneumothorax or effusion. Underinflated. Enlarged cardiopericardial silhouette with some prominence of the central vasculature. Overlapping cardiac leads. Film is rotated to the left. Degenerative changes of the spine IMPRESSION: Underinflation. Enlarged heart with some central vascular congestion. Electronically Signed   By: Karen Kays M.D.   On: 04/19/2023 13:07   DG Chest Port 1 View  Result Date: 05/09/2023 CLINICAL DATA:  syncope, hiccups EXAM: PORTABLE CHEST 1 VIEW COMPARISON:  CXR 06/28/21 FINDINGS: No pleural effusion. No pneumothorax. No focal airspace opacity. Cardiomegaly. Compared to prior exam there is increased prominence of the right hilum, which may be secondary to pulmonary venous congestion or atypical infection. No radiographically apparent displaced rib fractures. Visualized upper abdomen is unremarkable. IMPRESSION: Increased prominence of the right hilum, which may be secondary to pulmonary venous congestion or atypical infection. If clinically indicated, this could be further assessed with a chest CT. Otherwise recommend follow-up chest radiograph in 6-8 weeks to ensure resolution. Electronically Signed   By: Lorenza Cambridge M.D.   On: 05/09/2023 10:09   CT Head Wo Contrast  Result Date: 05/18/2023 CLINICAL DATA:  Provided history: Head trauma, minor. EXAM: CT HEAD WITHOUT CONTRAST TECHNIQUE:  Contiguous axial images were obtained from the base of the skull through the vertex without intravenous contrast. RADIATION DOSE REDUCTION: This exam was performed according to the departmental dose-optimization program which includes automated exposure control, adjustment of the mA and/or kV according to patient size and/or use of iterative reconstruction technique. COMPARISON:  Brain MRI 06/28/2021.  Head CT 06/27/2021. FINDINGS: Brain: Mild generalized cerebral atrophy. Mild chronic small vessel ischemic changes within the cerebral white matter, better appreciated on the prior brain MRI of 06/28/2021. There is no acute intracranial hemorrhage. No demarcated cortical infarct. No extra-axial fluid collection. No evidence of an intracranial mass. No midline shift. Vascular: No hyperdense vessel.  Atherosclerotic calcifications. Skull: No calvarial fracture or aggressive osseous lesion. Sinuses/Orbits: No mass or acute finding within the imaged orbits. No significant paranasal sinus disease. Other: Right parietal scalp hematoma. Prior left parotidectomy. Indeterminate 11 mm cystic lesion within the parotidectomy bed, unchanged in size from the prior brain MRI of 06/28/2021. IMPRESSION: 1.  No evidence of an acute intracranial abnormality. 2. Right parietal scalp hematoma. 3. Mild chronic small vessel ischemic disease within the cerebral white matter. 4. Mild generalized cerebral atrophy. 5. Prior left parotidectomy. Indeterminate 11 mm cystic lesion within the parotidectomy bed, unchanged in size from the prior brain MRI of 06/28/2021. Please refer to the prior brain MRI for further description and for recommendations. Electronically Signed   By: Jackey Loge D.O.   On: 04/21/2023 10:05    Procedures .Critical Care  Performed by: Eber Hong, MD Authorized by: Eber Hong, MD   Critical care provider statement:    Critical care time (minutes):  45   Critical care time was exclusive of:  Separately  billable procedures and treating other patients and teaching time   Critical care was necessary to treat or prevent imminent or life-threatening deterioration of the following conditions:  Trauma   Critical care was time spent personally by me on the following activities:  Development of treatment plan with patient or surrogate, discussions with consultants, evaluation of patient's response to treatment, examination of patient, obtaining history  from patient or surrogate, review of old charts, re-evaluation of patient's condition, pulse oximetry, ordering and review of radiographic studies, ordering and review of laboratory studies and ordering and performing treatments and interventions   I assumed direction of critical care for this patient from another provider in my specialty: no     Care discussed with: admitting provider   Comments:         {Document cardiac monitor, telemetry assessment procedure when appropriate:1}  Medications Ordered in ED Medications  ondansetron (ZOFRAN) injection 4 mg (has no administration in time range)  coag fact Xa recombinant (ANDEXXA) low dose infusion 900 mg (has no administration in time range)  iohexol (OMNIPAQUE) 350 MG/ML injection 75 mL (75 mLs Intravenous Contrast Given 04/30/2023 1330)    ED Course/ Medical Decision Making/ A&P   {   Click here for ABCD2, HEART and other calculatorsREFRESH Note before signing :1}                          Medical Decision Making Amount and/or Complexity of Data Reviewed Labs: ordered. Radiology: ordered. ECG/medicine tests: ordered.  Risk Prescription drug management.    This patient presents to the ED for concern of altered mental status after a fall, this involves an extensive number of treatment options, and is a complaint that carries with it a high risk of complications and morbidity.  The differential diagnosis includes cardiac, neurologic, seizure, syncope, trauma   Co morbidities that complicate the  patient evaluation  Atrial fibrillation, anticoagulated   Additional history obtained:  Additional history obtained from medical record and the paramedic report External records from outside source obtained and reviewed including CT scan reviewed from earlier in the day which was unremarkable   Lab Tests:  I Ordered, and personally interpreted labs.  The pertinent results include: CBC without a leukocytosis   Imaging Studies ordered:  I ordered imaging studies including CT head - SAH present bilateral hemispheres, possibly aneurysmal,   I independently visualized and interpreted imaging which showed new tremporal bone fracture and bilateral SAH I agree with the radiologist interpretation   Cardiac Monitoring: / EKG:  The patient was maintained on a cardiac monitor.  I personally viewed and interpreted the cardiac monitored which showed an underlying rhythm of: afib   Consultations Obtained:  I requested consultation with the neurosurgery team, trauma teama nd radiologist.,  and discussed lab and imaging findings as well as pertinent plan - they recommend: admission - and reversal of eliquis Discussed the case with the neurosurgeon who will see the patient in consultation but believe that this is likely more traumatic, they recommend that the patient be admitted to medical service for their workup for syncope and that they will address the issue of the bleeding, they do request reversal Discussed with pharmacist who will coordinate reversal with the appropriate medications, orders signed Discussed with trauma surgery who come to see the patient in consultation as well   Problem List / ED Course / Critical interventions / Medication management  Patient is critically ill with what appears to be an acute traumatic subarachnoid hemorrhage, recurrent syncope this morning, needs workup for syncope. I ordered medication including anticoagulation reversal for subarachnoid  hemorrhage Reevaluation of the patient after these medicines showed that the patient critically ill I have reviewed the patients home medicines and have made adjustments as needed   Social Determinants of Health:  Critically ill   Test / Admission - Considered:  Admit  to higher level of care   {Document critical care time when appropriate:1} {Document review of labs and clinical decision tools ie heart score, Chads2Vasc2 etc:1}  {Document your independent review of radiology images, and any outside records:1} {Document your discussion with family members, caretakers, and with consultants:1} {Document social determinants of health affecting pt's care:1} {Document your decision making why or why not admission, treatments were needed:1} Final Clinical Impression(s) / ED Diagnoses Final diagnoses:  Traumatic subarachnoid hemorrhage with loss of consciousness of 30 minutes or less, initial encounter (HCC)  Syncope, unspecified syncope type    Rx / DC Orders ED Discharge Orders     None

## 2023-04-22 NOTE — ED Notes (Signed)
Family updated as to patient's status per DR. MIller

## 2023-04-22 NOTE — ED Provider Notes (Signed)
[place] [facility] AT [facility] Provider Note   CSN: [id number] Arrival date & time: 05/13/2023  5621     History {Add pertinent medical, surgical, social history, OB history to HPI:1} Chief Complaint  Patient presents with   Hiccups    Glenn Ortega is a 89 y.o. male.  He has a history of A-fib and is on Eliquis.  He said he started having hiccups at dinnertime last night and with a hiccup he would vomit up some clear frothy phlegm.  Was unable to finish dinner due to this.  He said he has had hiccups before so this was not unusual.  He was getting up overnight to use the bathroom had some more hookups and then woke up on the floor.  He thinks he might of passed out and hit the back of his head.  He does not know how long he might of passed out.  He has a little bit of pain in the back of his head and a lump.  He is also still having hiccups although they seem a little bit better right now.  No chest pain shortness of breath abdominal pain fevers chills.  No numbness or weakness.  The history is provided by the patient.  Loss of Consciousness Episode history:  Single Most recent episode:  Today Progression:  Resolved Chronicity:  New Context: normal activity   Witnessed: no   Relieved by:  None tried Worsened by:  Nothing Ineffective treatments:  None tried Associated symptoms: nausea and vomiting   Associated symptoms: no chest pain, no difficulty breathing, no fever and no shortness of breath        Home Medications Prior to Admission medications   Medication Sig Start Date End Date Taking? Authorizing Provider  acetaminophen (TYLENOL) 650 MG CR tablet Take 1 tablet (650 mg total) by mouth every 8 (eight) hours as needed for pain. 11/08/22   Monica Becton, MD  apixaban (ELIQUIS) 5 MG TABS tablet Take 1 tablet by mouth twice daily. 04/11/23   Jonita Albee, PA-C  atorvastatin (LIPITOR) 10 MG tablet Take 1 tablet (10 mg total) by mouth  daily. 03/23/22   Nafziger, Kandee Keen, NP  furosemide (LASIX) 40 MG tablet Take 1 tablet (40 mg total) by mouth daily as needed. 02/14/23   Jonita Albee, PA-C  latanoprost (XALATAN) 0.005 % ophthalmic solution Place 1 drop into both eyes at bedtime. 03/04/23   [provider]  levothyroxine (SYNTHROID) 50 MCG tablet Take 1 tablet by mouth every morning. 12/03/22   Monica Becton, MD  losartan (COZAAR) 25 MG tablet Take 1 tablet by mouth daily. 03/21/23   Jonita Albee, PA-C  metoprolol succinate (TOPROL-XL) 25 MG 24 hr tablet Take 1/2 tablet by mouth daily. Take with or immediately following a meal. 12/03/22   Monica Becton, MD  Multiple Vitamins-Minerals (PRESERVISION AREDS 2 PO) Take 1 capsule by mouth 2 (two) times daily.    [provider]  pregabalin (LYRICA) 25 MG capsule 1 capsule p.o. nightly for a week then twice daily for a week then 3 times daily if needed 04/04/23   Monica Becton, MD      Allergies    Penicillins, Gabapentin, Lisinopril, and Penicillin g    Review of Systems   Review of Systems  Constitutional:  Negative for fever.  Eyes:  Negative for visual disturbance.  Respiratory:  Negative for shortness of breath.   Cardiovascular:  Positive for syncope. Negative  for chest pain.  Gastrointestinal:  Positive for nausea and vomiting.  Genitourinary:  Negative for dysuria.    Physical Exam Updated Vital Signs BP (!) 171/89   Pulse (!) 57   Temp 97.7 F (36.5 C) (Oral)   Resp 18   Ht 5\' 7"  (1.702 m)   Wt 92.1 kg   SpO2 99%   BMI 31.79 kg/m  Physical Exam Vitals and nursing note reviewed.  Constitutional:      General: He is not in acute distress.    Appearance: Normal appearance. He is well-developed.  HENT:     Head: Normocephalic.     Comments: He has a contusion and some tenderness on the right posterior scalp Eyes:     Conjunctiva/sclera: Conjunctivae normal.  Cardiovascular:     Rate and Rhythm: Normal rate.  Rhythm irregular.     Heart sounds: No murmur heard. Pulmonary:     Effort: Pulmonary effort is normal. No respiratory distress.     Breath sounds: Normal breath sounds.  Abdominal:     Palpations: Abdomen is soft.     Tenderness: There is no abdominal tenderness. There is no guarding or rebound.  Musculoskeletal:        General: No deformity. Normal range of motion.     Cervical back: Neck supple.  Skin:    General: Skin is warm and dry.     Capillary Refill: Capillary refill takes less than 2 seconds.  Neurological:     General: No focal deficit present.     Mental Status: He is alert and oriented to person, place, and time.     Cranial Nerves: No cranial nerve deficit.     Sensory: No sensory deficit.     Motor: No weakness.     ED Results / Procedures / Treatments   Labs (all labs ordered are listed, but only abnormal results are displayed) Labs Reviewed  COMPREHENSIVE METABOLIC PANEL  LIPASE, BLOOD  CBC WITH DIFFERENTIAL/PLATELET  TROPONIN I (HIGH SENSITIVITY)    EKG None  Radiology No results found.  Procedures Procedures  {Document cardiac monitor, telemetry assessment procedure when appropriate:1}  Medications Ordered in ED Medications  sodium chloride 0.9 % bolus 500 mL (has no administration in time range)  pantoprazole (PROTONIX) injection 40 mg (has no administration in time range)    ED Course/ Medical Decision Making/ A&P   {   Click here for ABCD2, HEART and other calculatorsREFRESH Note before signing :1}                          Medical Decision Making Amount and/or Complexity of Data Reviewed Labs: ordered. Radiology: ordered.  Risk Prescription drug management.   This patient complains of ***; this involves an extensive number of treatment Options and is a complaint that carries with it a high risk of complications and morbidity. The differential includes ***  I ordered, reviewed and interpreted labs, which included *** I ordered  medication *** and reviewed PMP when indicated. I ordered imaging studies which included *** and I independently    visualized and interpreted imaging which showed *** Additional history obtained from *** Previous records obtained and reviewed *** I consulted *** and discussed lab and imaging findings and discussed disposition.  Cardiac monitoring reviewed, *** Social determinants considered, *** Critical Interventions: ***  After the interventions stated above, I reevaluated the patient and found *** Admission and further testing considered, ***   {Document critical care time  when appropriate:1} {Document review of labs and clinical decision tools ie heart score, Chads2Vasc2 etc:1}  {Document your independent review of radiology images, and any outside records:1} {Document your discussion with family members, caretakers, and with consultants:1} {Document social determinants of health affecting pt's care:1} {Document your decision making why or why not admission, treatments were needed:1} Final Clinical Impression(s) / ED Diagnoses Final diagnoses:  None    Rx / DC Orders ED Discharge Orders     None

## 2023-04-22 NOTE — ED Triage Notes (Signed)
Pt reports that he has had hiccups since last night dinner. States that he thinks he passed out last night and woke up in the dining room floor. States he has a knot on his head and that he takes blood thinner. States that he is also vomiting a clear phlegm.

## 2023-04-22 NOTE — Progress Notes (Signed)
Orthopedic Tech Progress Note Patient Details:  Glenn Ortega 1934-08-18 [id number]  Level 2 Trauma. Patient ID: Glenn Ortega, male   DOB: Dec 31, 1933, 89 y.o.   MRN: [id number]  Sherilyn Banker 04/29/2023, 12:55 PM

## 2023-04-22 NOTE — Progress Notes (Addendum)
CT CAP reviewed. Recommend clinical exam of back when more participatory due to age indeterminate L2/L5 superior endplate fx. NSGY already engaged due to ICH, defer to their recs re: LSO brace vs amublation as toelrated. Trauma to sign off.   Diamantina Monks, MD General and Trauma Surgery [facility]

## 2023-04-22 NOTE — Consult Note (Signed)
Chief Complaint   Chief Complaint  Patient presents with   Fall on thinners    History of Present Illness  Glenn Ortega is a 89 y.o. male seen in the resuscitation bay in the emergency department after being brought in by EMS having passed out at home.  Patient apparently had a similar episode where there was an unwitnessed fall, found by family and taken to one of the freestanding urgent cares where CT scan did not reveal any significant intracranial hemorrhage.  The patient was discharged home.  This morning, per family, he apparently fell while in the dining room.  Upon his arrival to the emergency department, per ED personnel, the patient was apparently awake, alert, and oriented.  He was carrying on full conversations.  He is apparently become much less responsive again.  Of note, the patient is on Eliquis for atrial fibrillation.  He also has a history of hypertension.  Past Medical History   Past Medical History:  Diagnosis Date   Atrial fibrillation (HCC)    Back pain    GERD (gastroesophageal reflux disease)    Hyperlipidemia    Hypertension    EKG and chest x ray 8/12 [facility]/states had stress test 2 yrs ago- doesnt remember where- not in EPIC   Spinal stenosis    lumbar   Thyroid trouble     Past Surgical History   Past Surgical History:  Procedure Laterality Date   CARPAL TUNNEL RELEASE Right    09/2021   COLONOSCOPY     CYSTOSCOPY  12/30/2011   Procedure: CYSTOSCOPY;  Surgeon: Martina Sinner, MD;  Location: [facility];  Service: Urology;  Laterality: N/A;   EYE SURGERY Bilateral 06/18/2017   cataract sx with lens implant   HERNIA REPAIR  2013   HERNIA REPAIR Right    age  69   LUMBAR LAMINECTOMY/DECOMPRESSION MICRODISCECTOMY N/A 11/09/2013   Procedure: Lumbar Laminectomy/Decompression, Microdiscectomy Lumbar Three-Four, Four-Five, with Coflex;  Surgeon: Reinaldo Meeker, MD;  Location: [facility];  Service: Neurosurgery;  Laterality: N/A;  Lumbar  Laminectomy/Decompression, Microdiscectomy Lumbar Three-Four, Four-Five, with Coflex   PAROTIDECTOMY Left 11/17/2015   Procedure: LEFT PAROTIDECTOMY;  Surgeon: Serena Colonel, MD;  Location: [facility];  Service: ENT;  Laterality: Left;   PROSTATE SURGERY  2013   SALIVARY GLAND SURGERY     TONSILLECTOMY     trigger thumb  1980   right    Social History   Social History   Tobacco Use   Smoking status: Never   Smokeless tobacco: Never   Tobacco comments:    per patient he never smoke. 11/25/2014 /rc  Vaping Use   Vaping Use: Never used  Substance Use Topics   Alcohol use: Yes    Alcohol/week: 2.0 standard drinks of alcohol    Types: 2 Standard drinks or equivalent per week    Comment: socially- occ beer   Drug use: No    Medications   Prior to Admission medications   Medication Sig Start Date End Date Taking? Authorizing Provider  acetaminophen (TYLENOL) 650 MG CR tablet Take 1 tablet (650 mg total) by mouth every 8 (eight) hours as needed for pain. 11/08/22   Monica Becton, MD  apixaban (ELIQUIS) 5 MG TABS tablet Take 1 tablet by mouth twice daily. 04/11/23   Jonita Albee, PA-C  atorvastatin (LIPITOR) 10 MG tablet Take 1 tablet (10 mg total) by mouth daily. 03/23/22   Nafziger, Kandee Keen, NP  furosemide (LASIX) 40 MG  tablet Take 1 tablet (40 mg total) by mouth daily as needed. 02/14/23   Jonita Albee, PA-C  latanoprost (XALATAN) 0.005 % ophthalmic solution Place 1 drop into both eyes at bedtime. 03/04/23   [provider]  levothyroxine (SYNTHROID) 50 MCG tablet Take 1 tablet by mouth every morning. 12/03/22   Monica Becton, MD  losartan (COZAAR) 25 MG tablet Take 1 tablet by mouth daily. 03/21/23   Jonita Albee, PA-C  metoprolol succinate (TOPROL-XL) 25 MG 24 hr tablet Take 1/2 tablet by mouth daily. Take with or immediately following a meal. 12/03/22   Monica Becton, MD  Multiple Vitamins-Minerals (PRESERVISION AREDS 2  PO) Take 1 capsule by mouth 2 (two) times daily.    [provider]  ondansetron (ZOFRAN-ODT) 4 MG disintegrating tablet Take 1 tablet (4 mg total) by mouth every 8 (eight) hours as needed for nausea or vomiting. 04/20/2023   Terrilee Files, MD  pantoprazole (PROTONIX) 40 MG tablet Take 1 tablet (40 mg total) by mouth daily. 05/02/2023   Terrilee Files, MD  pregabalin (LYRICA) 25 MG capsule 1 capsule p.o. nightly for a week then twice daily for a week then 3 times daily if needed 04/04/23   Monica Becton, MD    Allergies   Allergies  Allergen Reactions   Penicillins Swelling    CHILDHOOD ALLERGY Has patient had a PCN reaction causing immediate rash, facial/tongue/throat swelling, SOB or lightheadedness with hypotension: Yes Has patient had a PCN reaction causing severe rash involving mucus membranes or skin necrosis: No Has patient had a PCN reaction that required hospitalization: No Has patient had a PCN reaction occurring within the last 10 years: No If all of the above answers are "NO", then may proceed with Cephalosporin use.    Gabapentin Other (See Comments)    "MADE ME FEEL CRAZY"   Lisinopril Cough   Penicillin G Hives    Review of Systems  ROS  Neurologic Exam  Arouses to pain and voice Pupils 3mm, reactive Moans, attempts to answer questions but unintelligibly Moves all extremities purposefully, not following commands  Imaging  Noncontrast CT scan of the head was personally reviewed and does demonstrate relatively diffuse basal subarachnoid hemorrhage, with extension into the convexities bilaterally.  There is also a anterior falcine subdural hematoma, and likely small epidural hematoma over the right parietal convexity.  There is no hydrocephalus.  Interestingly, CT scan from earlier this morning at 9 AM is essentially unremarkable, certainly without any evidence of intracranial hemorrhage.  CT angiogram of the head was also personally reviewed.  There is  no intracranial aneurysm or arteriovenous malformation.  There is calcific atherosclerotic disease primarily involving the cavernous carotid arteries bilaterally.  Impression  - 89 y.o. male with multiple witnessed syncopal events today, of unclear etiology.  While the diffuse nature of subarachnoid hemorrhage certainly raises the possibility of underlying aneurysm, picture is complicated by his anticoagulation for A-fib.  Reassuringly, the CT angiogram is negative for intracranial aneurysm.  While he deserves further syncopal workup, one possibility is seizures, especially with the episodic nature of his mental status waxing and waning.  Plan  -Patient's Eliquis is currently being reversed -We will plan on admission to the medical service for further syncopal workup -I will start Keppra 1000 mg IV x 1 followed by 500 mg every 12 -We will plan on repeat CT scan of the head without contrast in 8 hours -I will also order EEG.   Lisbeth Renshaw,  MD [facility]

## 2023-04-22 NOTE — Progress Notes (Signed)
Pt has bed assignments for 4N.He will be moving shortly holding off on EEG at this time. Will try back as schedule permits.

## 2023-04-22 NOTE — TOC CAGE-AID Note (Signed)
Transition of Care [facility]) - CAGE-AID Screening   Patient Details  Name: Glenn Ortega MRN: [id number] Date of Birth: 06/09/1934   Hewitt Shorts, RN Trauma Response Nurse Phone Number: [phone] 05/10/2023, 5:52 PM   Clinical Narrative:  To ED with fall today, was seen at [facility] for same this morning. On Eliquis.   CAGE-AID Screening: Substance Abuse Screening unable to be completed due to: : Patient unable to participate (pt is confused with decreasing level of consciousness throughout time in ED)

## 2023-04-22 NOTE — H&P (Signed)
NAME:  Glenn Ortega, MRN:  [id number], DOB:  Feb 17, 1934, LOS: 0 ADMISSION DATE:  05/17/2023 CONSULTATION DATE:  05/16/2023 REFERRING MD:  Alinda Money - TRH CHIEF COMPLAINT:  Syncope with fall on [facility]  History of Present Illness:  89 year old man who presented to [facility] ED 7/5 after fall at home 2/2 syncope. PMHx significant for HTN, HLD, AFib (on Eliquis), OSA, hypothyroidism, GERD, lumbar spinal stenosis.  Patient initially presented to [facility] 7/5 after a fall at home, likely 2/2 syncopal event. Noted "knot"/lump on the back of his head, on blood thinners for AFib. Initial CT Head was negative for AICA and patient was discharged home. Patient unfortunately had another syncopal event at home after discharge for which EMS was called; patient was reportedly not feeling well, went to the ground and then became unresponsive. On ED arrival, patient reported nausea, denied CP/SOB. Was breathing/did have a pulse. Confused on arrival and reportedly did not remember falling.   On ED arrival, patient was afebrile, bradycardic in 50s, hypertensive with SBP 160-170s, SpO2 99% on RA. Increasingly less responsive. Labs were notable for normal CBC, Na 140, K 4.0, CO2 19, BUN/Cr 15/1.18 (baseline), LFTs WNL with exception of Tbili 1.6. LA 1.9. Trop 15 > 16. INR 1.4. Repeat CT Head in ED 7/5 with SDH 8mm and bilateral SAH. CTA without aneurysm/LVO. NSGY was consulted and recommended AC reversal, Andexxa was administered in the ED. Trauma was consulted as well (signed off as event r/t syncope).  PCCM consulted for ICU admission.  Pertinent Medical History:   Past Medical History:  Diagnosis Date   Atrial fibrillation (HCC)    Back pain    GERD (gastroesophageal reflux disease)    Hyperlipidemia    Hypertension    EKG and chest x ray 8/12 [facility]/states had stress test 2 yrs ago- doesnt remember where- not in EPIC   Spinal stenosis    lumbar   Thyroid trouble    Significant Hospital Events: Including procedures, antibiotic  start and stop dates in addition to other pertinent events   7/5 - Presented to [facility] for syncope/fall on [facility]. CT Head NAICA. Discharged home. Recurrent syncopal episode/fall at home with decreased responsiveness. BIB EMS for reevaluation. CT Head with right SDH 8mm, bilateral SAH. NSGY consulted, Andexxa given. PCCM consulted for ICU admission.  Interim History / Subjective:  PCCM consulted for ICU admission  Objective:  Blood pressure 135/66, pulse 73, temperature (S) (!) 97.5 F (36.4 C), temperature source (S) Oral, resp. rate 20, height 5\' 7"  (1.702 m), weight 92.1 kg, SpO2 96 %.        Intake/Output Summary (Last 24 hours) at 04/23/2023 1612 Last data filed at 04/24/2023 1556 Gross per 24 hour  Intake 100 ml  Output --  Net 100 ml   Filed Weights   05/16/2023 1250  Weight: 92.1 kg   Physical Examination: General: Acutely ill-appearing elderly man in NAD. Awake/not alert, flailing arms. HEENT: Posterior R scalp hematoma, no laceration. Anicteric sclera, PERRL, dry mucous membranes. +Dentures Neuro: Lethargic. Responds intermittently/briefly to persistent verbal/tactile stimuli. Withdraws to pain in all 4 extremities. Not following commands. Moves all 4 extremities spontaneously.   CV: Irregularly irregular rhythm, rate 70s-90s, no m/g/r. PULM: Breathing even and unlabored on RA. Lung fields CTAB. GI: Soft, nontender, nondistended. Normoactive bowel sounds. Extremities: No LE edema noted. Skin: Warm/dry, no rashes, scattered small areas of ecchymosis.  Resolved Hospital Problem List:    Assessment & Plan:  Right subdural hematoma and bilateral subarachnoid hemorrhage S/p  syncopal episode x 2 with fall Presented to [facility] for syncope/fall on AC. CT Head NAICA. Discharged home. Recurrent syncopal episode/fall at home with decreased responsiveness. BIB EMS for reevaluation. CT Head with right SDH 8mm, bilateral SAH. - Admit to ICU for close monitoring - NSGY consulted, appreciate  recommendations - S/p Andexxa AC reversal - Keppra for seizure ppx - F/u EEG - Repeat CT ~2200 per NSGY - Frequent neurochecks - Neuroprotective measures: HOB > 30 degrees, normoglycemia, normothermia, electrolytes WNL - High risk for intubation if mental status continues to worsen  Atrial fibrillation, on Eliquis - Cardiac monitoring - Optimize electrolytes for K > 4, Mg > 2 - Hold AC at present, given acute SDH/SAH - F/u Echo  HTN HLD - Resume home antihypertensives when clinically appropriate - Resume statin  OSA - Supplemental O2 support as needed for O2 sat > 90% - CPAP PRN - Pulmonary hygiene  GERD - PPI  Hypothyroidism - Continue levothyroxine  Best Practice: (right click and "Reselect all SmartList Selections" daily)   Diet/type: NPO DVT prophylaxis: SCDs, AC contraindicated in the setting of SDH/SAH GI prophylaxis: PPI Lines: N/A Foley:  N/A Code Status:  full code Last date of multidisciplinary goals of care discussion [Pending]  Labs:  CBC: Recent Labs  Lab 04/29/2023 0910 05/04/2023 1249  WBC 8.3 9.8  NEUTROABS 5.7 6.6  HGB 14.3 14.4  HCT 43.3 43.3  MCV 85.6 89.5  PLT 163 157   Basic Metabolic Panel: Recent Labs  Lab 05/11/2023 0910 04/29/2023 1249  NA 139 140  K 4.4 4.0  CL 107 109  CO2 22 19*  GLUCOSE 94 113*  BUN 17 15  CREATININE 0.96 1.18  CALCIUM 8.5* 8.5*  MG  --  1.9   GFR: Estimated Creatinine Clearance: 48.6 mL/min (by C-G formula based on SCr of 1.18 mg/dL). Recent Labs  Lab 05/04/2023 0910 05/07/2023 1249  WBC 8.3 9.8  LATICACIDVEN  --  1.9   Liver Function Tests: Recent Labs  Lab 05/13/2023 0910 04/27/2023 1249  AST 23 26  ALT 12 16  ALKPHOS 44 44  BILITOT 1.6* 1.6*  PROT 7.2 6.7  ALBUMIN 3.9 3.7   Recent Labs  Lab 05/13/2023 0910  LIPASE 45   No results for input(s): "AMMONIA" in the last 168 hours.  ABG:    Component Value Date/Time   TCO2 25 05/31/2011 1938    Coagulation Profile: Recent Labs  Lab  04/27/2023 1249  INR 1.4*   Cardiac Enzymes: No results for input(s): "CKTOTAL", "CKMB", "CKMBINDEX", "TROPONINI" in the last 168 hours.  HbA1C: Hgb A1c MFr Bld  Date/Time Value Ref Range Status  03/07/2023 09:17 AM 5.9 (H) <5.7 % of total Hgb Final    Comment:    For someone without known diabetes, a hemoglobin  A1c value between 5.7% and 6.4% is consistent with prediabetes and should be confirmed with a  follow-up test. . For someone with known diabetes, a value <7% indicates that their diabetes is well controlled. A1c targets should be individualized based on duration of diabetes, age, comorbid conditions, and other considerations. . This assay result is consistent with an increased risk of diabetes. . Currently, no consensus exists regarding use of hemoglobin A1c for diagnosis of diabetes for children. .   09/06/2022 12:00 AM 5.7 (H) <5.7 % of total Hgb Final    Comment:    For someone without known diabetes, a hemoglobin  A1c value between 5.7% and 6.4% is consistent with prediabetes and should be  confirmed with a  follow-up test. . For someone with known diabetes, a value <7% indicates that their diabetes is well controlled. A1c targets should be individualized based on duration of diabetes, age, comorbid conditions, and other considerations. . This assay result is consistent with an increased risk of diabetes. . Currently, no consensus exists regarding use of hemoglobin A1c for diagnosis of diabetes for children. .    CBG: Recent Labs  Lab 05/18/2023 1539  GLUCAP 133*    Review of Systems:   Patient is encephalopathic and/or intubated; therefore, history has been obtained from chart review.   Past Medical History:  He,  has a past medical history of Atrial fibrillation (HCC), Back pain, GERD (gastroesophageal reflux disease), Hyperlipidemia, Hypertension, Spinal stenosis, and Thyroid trouble.   Surgical History:   Past Surgical History:  Procedure  Laterality Date   CARPAL TUNNEL RELEASE Right    09/2021   COLONOSCOPY     CYSTOSCOPY  12/30/2011   Procedure: CYSTOSCOPY;  Surgeon: Martina Sinner, MD;  Location: [facility];  Service: Urology;  Laterality: N/A;   EYE SURGERY Bilateral 06/18/2017   cataract sx with lens implant   HERNIA REPAIR  2013   HERNIA REPAIR Right    age  47   LUMBAR LAMINECTOMY/DECOMPRESSION MICRODISCECTOMY N/A 11/09/2013   Procedure: Lumbar Laminectomy/Decompression, Microdiscectomy Lumbar Three-Four, Four-Five, with Coflex;  Surgeon: Reinaldo Meeker, MD;  Location: [facility];  Service: Neurosurgery;  Laterality: N/A;  Lumbar Laminectomy/Decompression, Microdiscectomy Lumbar Three-Four, Four-Five, with Coflex   PAROTIDECTOMY Left 11/17/2015   Procedure: LEFT PAROTIDECTOMY;  Surgeon: Serena Colonel, MD;  Location: [facility];  Service: ENT;  Laterality: Left;   PROSTATE SURGERY  2013   SALIVARY GLAND SURGERY     TONSILLECTOMY     trigger thumb  1980   right   Social History:   reports that he has never smoked. He has never used smokeless tobacco. He reports current alcohol use of about 2.0 standard drinks of alcohol per week. He reports that he does not use drugs.   Family History:  His family history includes Cancer in his mother; Heart disease in his brother; Hypertension in an other family member; Myasthenia gravis in his father; Obesity in his father.   Allergies: Allergies  Allergen Reactions   Penicillins Swelling    CHILDHOOD ALLERGY Has patient had a PCN reaction causing immediate rash, facial/tongue/throat swelling, SOB or lightheadedness with hypotension: Yes Has patient had a PCN reaction causing severe rash involving mucus membranes or skin necrosis: No Has patient had a PCN reaction that required hospitalization: No Has patient had a PCN reaction occurring within the last 10 years: No If all of the above answers are "NO", then may proceed with Cephalosporin use.    Gabapentin  Other (See Comments)    "MADE ME FEEL CRAZY"   Lisinopril Cough   Penicillin G Hives   Home Medications: Prior to Admission medications   Medication Sig Start Date End Date Taking? Authorizing Provider  acetaminophen (TYLENOL) 650 MG CR tablet Take 1 tablet (650 mg total) by mouth every 8 (eight) hours as needed for pain. 11/08/22   Monica Becton, MD  apixaban (ELIQUIS) 5 MG TABS tablet Take 1 tablet by mouth twice daily. 04/11/23   Jonita Albee, PA-C  atorvastatin (LIPITOR) 10 MG tablet Take 1 tablet (10 mg total) by mouth daily. 03/23/22   Nafziger, Kandee Keen, NP  furosemide (LASIX) 40 MG tablet Take 1 tablet (40 mg total) by  mouth daily as needed. 02/14/23   Jonita Albee, PA-C  latanoprost (XALATAN) 0.005 % ophthalmic solution Place 1 drop into both eyes at bedtime. 03/04/23   [provider]  levothyroxine (SYNTHROID) 50 MCG tablet Take 1 tablet by mouth every morning. 12/03/22   Monica Becton, MD  losartan (COZAAR) 25 MG tablet Take 1 tablet by mouth daily. 03/21/23   Jonita Albee, PA-C  metoprolol succinate (TOPROL-XL) 25 MG 24 hr tablet Take 1/2 tablet by mouth daily. Take with or immediately following a meal. 12/03/22   Monica Becton, MD  Multiple Vitamins-Minerals (PRESERVISION AREDS 2 PO) Take 1 capsule by mouth 2 (two) times daily.    [provider]  ondansetron (ZOFRAN-ODT) 4 MG disintegrating tablet Take 1 tablet (4 mg total) by mouth every 8 (eight) hours as needed for nausea or vomiting. 05/09/2023   Terrilee Files, MD  pantoprazole (PROTONIX) 40 MG tablet Take 1 tablet (40 mg total) by mouth daily. 04/21/2023   Terrilee Files, MD  pregabalin (LYRICA) 25 MG capsule 1 capsule p.o. nightly for a week then twice daily for a week then 3 times daily if needed 04/04/23   Monica Becton, MD   Critical care time:    The patient is critically ill with multiple organ system failure and requires high complexity decision making for  assessment and support, frequent evaluation and titration of therapies, advanced monitoring, review of radiographic studies and interpretation of complex data.   Critical Care Time devoted to patient care services, exclusive of separately billable procedures, described in this note is 34 minutes.  Tim Lair, PA-C [facility] 05/09/2023 4:12 PM  Please see [email or web address] for pager details.  From 7A-7P if no response, please call [phone] After hours, please call ELink [phone]

## 2023-04-22 NOTE — Progress Notes (Signed)
Called nurse. Patient going to CT. Will check back in 30 minutes after CT.

## 2023-04-22 NOTE — Progress Notes (Signed)
LTM to be completed later. STAT EEG came in before hook up.

## 2023-04-22 NOTE — ED Triage Notes (Signed)
Trauma Response Nurse Documentation   Glenn Ortega is a 89 y.o. male arriving to [facility] ED via EMS  On Eliquis (apixaban) daily. Trauma was activated as a Level 2 by charge RN based on the following trauma criteria Elderly patients > 65 with head trauma on anti-coagulation (excluding ASA).  Patient cleared for CT by Dr. Hyacinth Meeker. Pt transported to CT with trauma response nurse present to monitor. RN remained with the patient throughout their absence from the department for clinical observation.   GCS 14.  History   Past Medical History:  Diagnosis Date   Atrial fibrillation (HCC)    Back pain    GERD (gastroesophageal reflux disease)    Hyperlipidemia    Hypertension    EKG and chest x ray 8/12 EPIC/states had stress test 2 yrs ago- doesnt remember where- not in EPIC   Spinal stenosis    lumbar   Thyroid trouble      Past Surgical History:  Procedure Laterality Date   CARPAL TUNNEL RELEASE Right    09/2021   COLONOSCOPY     CYSTOSCOPY  12/30/2011   Procedure: CYSTOSCOPY;  Surgeon: Martina Sinner, MD;  Location: [facility];  Service: Urology;  Laterality: N/A;   EYE SURGERY Bilateral 06/18/2017   cataract sx with lens implant   HERNIA REPAIR  2013   HERNIA REPAIR Right    age  40   LUMBAR LAMINECTOMY/DECOMPRESSION MICRODISCECTOMY N/A 11/09/2013   Procedure: Lumbar Laminectomy/Decompression, Microdiscectomy Lumbar Three-Four, Four-Five, with Coflex;  Surgeon: Reinaldo Meeker, MD;  Location: [facility];  Service: Neurosurgery;  Laterality: N/A;  Lumbar Laminectomy/Decompression, Microdiscectomy Lumbar Three-Four, Four-Five, with Coflex   PAROTIDECTOMY Left 11/17/2015   Procedure: LEFT PAROTIDECTOMY;  Surgeon: Serena Colonel, MD;  Location: [facility];  Service: ENT;  Laterality: Left;   PROSTATE SURGERY  2013   SALIVARY GLAND SURGERY     TONSILLECTOMY     trigger thumb  1980   right       Initial Focused Assessment (If applicable, or please see trauma  documentation):   CT's Completed:   CT Head and CT C-SpineCTA of brain/c-spine  Interventions:   Labs Xrays CT scans Admit  Plan for disposition:  Admission to ICU   Consults completed:  Neurosurgeon at 1350.  Event Summary: TO ED via [facility] after having a witnessed syncopal episode falling backwards and hitting head-- pt's daughter was at the house, stated "he didn't have a pulse, I started CPR" When EMS arrived, pt had a pulse.  Pt was seen at [facility] for a fall earlier this morning with a negative Head CT.   On arrival- GCS -14-- only oriented to person. Has repetitive questioning-- "I don't know where I am. What happened?"      Bedside handoff with ED RN Josh.    Glenn Ortega  Trauma Response RN  Please call TRN at [phone] for further assistance.

## 2023-04-22 NOTE — Telephone Encounter (Signed)
I agree.  Thank you.  He could have suffered a stroke.  Thank you.

## 2023-04-22 NOTE — H&P (Addendum)
History and Physical   Glenn Ortega JXB:[id number] DOB: December 11, 1933 DOA: 05/18/2023  PCP: Agapito Games, MD   Patient coming from: Home  Chief Complaint: Syncope and collapse, Confusion  HPI: Glenn Ortega is a 89 y.o. male with medical history significant of sleep apnea, hypertension, hypothyroidism, hyperlipidemia, GERD, BPH, obesity, atrial fibrillation, neuropathy, macular degeneration, osteoarthritis presenting after episode of syncope with collapse.  Patient actually presented to freestanding ED earlier today after an episode of syncope with some associated hiccups.  He was having some hiccups during dinner yesterday evening and would produce phlegm during the hiccups.  Unable to finish dinner, has had similar hiccups in the past.  He got up overnight to go to the bathroom and had some more hiccups and then he woke up on the floor.  Believed to have syncopized and hit the back of his head.  Not sure how long he might have been down.  Workup in the ED at that time was reassuring in terms of lab workup and negative CT head.  Patient was discharged home.  After returning home patient had another episode of syncope that was witnessed.  Was standing in the kitchen he felt unwell and then he syncopized and was unresponsive for period of time.  EMS was called and patient was responsive on arrival.  Did have nausea and confusion.  Patient unable to participate in review of systems due to altered mentation.  ED Course: Vital signs in the ED notable for blood pressure in the 120s to 170 systolic, heart rate in the 50s to 70s, respiratory rate in the teens to 20s.  Lab workup included CMP with bicarb 19, glucose 113, calcium 8.5, T. bili 1.6.  CBC within normal limits.  Troponin negative x 2 today.  Lipase normal earlier today.  Lactic acid negative.  Magnesium normal.  Chest x-ray today both times showed venous congestion but no other acute abnormality.  Initial CT head during previous  presentation showed no acute abnormality other than scalp hematoma and patient being status post parotid ectomy.  Repeat CT head today showed 8 mm subdural hematoma and bilateral subarachnoid hemorrhage.  CTA showed no evidence of aneurysm nor large vessel stenosis or occlusion.  Also showed no evidence of traumatic injury to the neck.  Trauma surgery and neurosurgery were consulted.  Trauma surgery will see the patient.  Neurosurgery will also see the patient and recommended anticoagulation reversal.  Patient received and Andexxa and Zofran in the ED.  Review of Systems: Patient unable to participate in review of systems due to altered mentation.  Past Medical History:  Diagnosis Date   Atrial fibrillation (HCC)    Back pain    GERD (gastroesophageal reflux disease)    Hyperlipidemia    Hypertension    EKG and chest x ray 8/12 [facility]/states had stress test 2 yrs ago- doesnt remember where- not in EPIC   Spinal stenosis    lumbar   Thyroid trouble     Past Surgical History:  Procedure Laterality Date   CARPAL TUNNEL RELEASE Right    09/2021   COLONOSCOPY     CYSTOSCOPY  12/30/2011   Procedure: CYSTOSCOPY;  Surgeon: Martina Sinner, MD;  Location: [facility];  Service: Urology;  Laterality: N/A;   EYE SURGERY Bilateral 06/18/2017   cataract sx with lens implant   HERNIA REPAIR  2013   HERNIA REPAIR Right    age  30   LUMBAR LAMINECTOMY/DECOMPRESSION MICRODISCECTOMY N/A 11/09/2013   Procedure: Lumbar  Laminectomy/Decompression, Microdiscectomy Lumbar Three-Four, Four-Five, with Coflex;  Surgeon: Reinaldo Meeker, MD;  Location: [facility];  Service: Neurosurgery;  Laterality: N/A;  Lumbar Laminectomy/Decompression, Microdiscectomy Lumbar Three-Four, Four-Five, with Coflex   PAROTIDECTOMY Left 11/17/2015   Procedure: LEFT PAROTIDECTOMY;  Surgeon: Serena Colonel, MD;  Location: [facility];  Service: ENT;  Laterality: Left;   PROSTATE SURGERY  2013   SALIVARY GLAND SURGERY      TONSILLECTOMY     trigger thumb  1980   right    Social History  reports that he has never smoked. He has never used smokeless tobacco. He reports current alcohol use of about 2.0 standard drinks of alcohol per week. He reports that he does not use drugs.  Allergies  Allergen Reactions   Penicillins Swelling    CHILDHOOD ALLERGY Has patient had a PCN reaction causing immediate rash, facial/tongue/throat swelling, SOB or lightheadedness with hypotension: Yes Has patient had a PCN reaction causing severe rash involving mucus membranes or skin necrosis: No Has patient had a PCN reaction that required hospitalization: No Has patient had a PCN reaction occurring within the last 10 years: No If all of the above answers are "NO", then may proceed with Cephalosporin use.    Gabapentin Other (See Comments)    "MADE ME FEEL CRAZY"   Lisinopril Cough   Penicillin G Hives    Family History  Problem Relation Age of Onset   Hypertension Other    Cancer Mother        bone?   Myasthenia gravis Father    Obesity Father    Heart disease Brother   Reviewed on admission  Prior to Admission medications   Medication Sig Start Date End Date Taking? Authorizing Provider  acetaminophen (TYLENOL) 650 MG CR tablet Take 1 tablet (650 mg total) by mouth every 8 (eight) hours as needed for pain. 11/08/22   Monica Becton, MD  apixaban (ELIQUIS) 5 MG TABS tablet Take 1 tablet by mouth twice daily. 04/11/23   Jonita Albee, PA-C  atorvastatin (LIPITOR) 10 MG tablet Take 1 tablet (10 mg total) by mouth daily. 03/23/22   Nafziger, Kandee Keen, NP  furosemide (LASIX) 40 MG tablet Take 1 tablet (40 mg total) by mouth daily as needed. 02/14/23   Jonita Albee, PA-C  latanoprost (XALATAN) 0.005 % ophthalmic solution Place 1 drop into both eyes at bedtime. 03/04/23   [provider]  levothyroxine (SYNTHROID) 50 MCG tablet Take 1 tablet by mouth every morning. 12/03/22   Monica Becton, MD   losartan (COZAAR) 25 MG tablet Take 1 tablet by mouth daily. 03/21/23   Jonita Albee, PA-C  metoprolol succinate (TOPROL-XL) 25 MG 24 hr tablet Take 1/2 tablet by mouth daily. Take with or immediately following a meal. 12/03/22   Monica Becton, MD  Multiple Vitamins-Minerals (PRESERVISION AREDS 2 PO) Take 1 capsule by mouth 2 (two) times daily.    [provider]  ondansetron (ZOFRAN-ODT) 4 MG disintegrating tablet Take 1 tablet (4 mg total) by mouth every 8 (eight) hours as needed for nausea or vomiting. 05/06/2023   Terrilee Files, MD  pantoprazole (PROTONIX) 40 MG tablet Take 1 tablet (40 mg total) by mouth daily. 05/11/2023   Terrilee Files, MD  pregabalin (LYRICA) 25 MG capsule 1 capsule p.o. nightly for a week then twice daily for a week then 3 times daily if needed 04/04/23   Monica Becton, MD    Physical Exam: Vitals:  04/18/2023 1244 04/27/2023 1250  BP: (S) 126/78   Resp: (S) 20   Temp: (S) (!) 97.5 F (36.4 C)   TempSrc: (S) Oral   SpO2: (S) 97%   Weight:  92.1 kg  Height:  5\' 7"  (1.702 m)    Physical Exam Constitutional:      General: He is not in acute distress.    Appearance: Normal appearance. He is obese. He is ill-appearing.  HENT:     Head: Normocephalic and atraumatic.     Mouth/Throat:     Mouth: Mucous membranes are moist.     Pharynx: Oropharynx is clear.  Eyes:     Extraocular Movements: Extraocular movements intact.     Pupils: Pupils are equal, round, and reactive to light.  Cardiovascular:     Rate and Rhythm: Normal rate and regular rhythm.     Pulses: Normal pulses.     Heart sounds: Normal heart sounds.  Pulmonary:     Effort: Pulmonary effort is normal. No respiratory distress.     Breath sounds: Normal breath sounds.  Abdominal:     General: Bowel sounds are normal. There is no distension.     Palpations: Abdomen is soft.     Tenderness: There is no abdominal tenderness.  Musculoskeletal:        General: No  swelling or deformity.  Skin:    General: Skin is warm and dry.  Neurological:     Comments: Patient is awake but not alert and unable to participate in neurologic exam.    Labs on Admission: I have personally reviewed following labs and imaging studies  CBC: Recent Labs  Lab 04/27/2023 0910 04/21/2023 1249  WBC 8.3 9.8  NEUTROABS 5.7 6.6  HGB 14.3 14.4  HCT 43.3 43.3  MCV 85.6 89.5  PLT 163 157    Basic Metabolic Panel: Recent Labs  Lab 04/30/2023 0910 05/18/2023 1249  NA 139 140  K 4.4 4.0  CL 107 109  CO2 22 19*  GLUCOSE 94 113*  BUN 17 15  CREATININE 0.96 1.18  CALCIUM 8.5* 8.5*  MG  --  1.9   GFR: Estimated Creatinine Clearance: 48.6 mL/min (by C-G formula based on SCr of 1.18 mg/dL).  Liver Function Tests: Recent Labs  Lab 04/24/2023 0910 05/03/2023 1249  AST 23 26  ALT 12 16  ALKPHOS 44 44  BILITOT 1.6* 1.6*  PROT 7.2 6.7  ALBUMIN 3.9 3.7    Urine analysis:    Component Value Date/Time   COLORURINE YELLOW (A) 06/28/2021 0030   APPEARANCEUR CLEAR (A) 06/28/2021 0030   LABSPEC >=1.030 06/28/2021 0030   PHURINE 5.5 06/28/2021 0030   GLUCOSEU NEGATIVE 06/28/2021 0030   HGBUR SMALL (A) 06/28/2021 0030   BILIRUBINUR NEGATIVE 06/28/2021 0030   KETONESUR NEGATIVE 06/28/2021 0030   PROTEINUR 30 (A) 06/28/2021 0030   NITRITE NEGATIVE 06/28/2021 0030   LEUKOCYTESUR NEGATIVE 06/28/2021 0030    Radiological Exams on Admission: DG Chest Port 1 View  Result Date: 05/18/2023 CLINICAL DATA:  Altered mental status EXAM: PORTABLE CHEST 1 VIEW COMPARISON:  04/18/2023 FINDINGS: No consolidation, pneumothorax or effusion. Underinflated. Enlarged cardiopericardial silhouette with some prominence of the central vasculature. Overlapping cardiac leads. Film is rotated to the left. Degenerative changes of the spine IMPRESSION: Underinflation. Enlarged heart with some central vascular congestion. Electronically Signed   By: Karen Kays M.D.   On: 05/01/2023 13:07   DG Chest  Port 1 View  Result Date: 05/09/2023 CLINICAL DATA:  syncope, hiccups EXAM:  PORTABLE CHEST 1 VIEW COMPARISON:  CXR 06/28/21 FINDINGS: No pleural effusion. No pneumothorax. No focal airspace opacity. Cardiomegaly. Compared to prior exam there is increased prominence of the right hilum, which may be secondary to pulmonary venous congestion or atypical infection. No radiographically apparent displaced rib fractures. Visualized upper abdomen is unremarkable. IMPRESSION: Increased prominence of the right hilum, which may be secondary to pulmonary venous congestion or atypical infection. If clinically indicated, this could be further assessed with a chest CT. Otherwise recommend follow-up chest radiograph in 6-8 weeks to ensure resolution. Electronically Signed   By: Lorenza Cambridge M.D.   On: 04/21/2023 10:09   CT Head Wo Contrast  Result Date: 05/12/2023 CLINICAL DATA:  Provided history: Head trauma, minor. EXAM: CT HEAD WITHOUT CONTRAST TECHNIQUE: Contiguous axial images were obtained from the base of the skull through the vertex without intravenous contrast. RADIATION DOSE REDUCTION: This exam was performed according to the departmental dose-optimization program which includes automated exposure control, adjustment of the mA and/or kV according to patient size and/or use of iterative reconstruction technique. COMPARISON:  Brain MRI 06/28/2021.  Head CT 06/27/2021. FINDINGS: Brain: Mild generalized cerebral atrophy. Mild chronic small vessel ischemic changes within the cerebral white matter, better appreciated on the prior brain MRI of 06/28/2021. There is no acute intracranial hemorrhage. No demarcated cortical infarct. No extra-axial fluid collection. No evidence of an intracranial mass. No midline shift. Vascular: No hyperdense vessel.  Atherosclerotic calcifications. Skull: No calvarial fracture or aggressive osseous lesion. Sinuses/Orbits: No mass or acute finding within the imaged orbits. No significant  paranasal sinus disease. Other: Right parietal scalp hematoma. Prior left parotidectomy. Indeterminate 11 mm cystic lesion within the parotidectomy bed, unchanged in size from the prior brain MRI of 06/28/2021. IMPRESSION: 1.  No evidence of an acute intracranial abnormality. 2. Right parietal scalp hematoma. 3. Mild chronic small vessel ischemic disease within the cerebral white matter. 4. Mild generalized cerebral atrophy. 5. Prior left parotidectomy. Indeterminate 11 mm cystic lesion within the parotidectomy bed, unchanged in size from the prior brain MRI of 06/28/2021. Please refer to the prior brain MRI for further description and for recommendations. Electronically Signed   By: Jackey Loge D.O.   On: 04/24/2023 10:05    EKG: Independently reviewed.  Sinus rhythm at 69 bpm.  Low voltage multiple leads.  Nonspecific ST changes.  Assessment/Plan Active Problems:   HYPERTENSION, BENIGN   Esophageal reflux   BPH (benign prostatic hyperplasia)   Mixed hyperlipidemia   AF (paroxysmal atrial fibrillation) (HCC)   BMI 38.0-38.9,adult   Hypothyroidism   Sleep apnea   Syncope and collapse   Traumatic encephalopathy   Subarachnoid hemorrhage (HCC)   Subdural hematoma (HCC)   Subdural Hematoma Subarachnoid Hematoma Traumatic encephalopathy > Presented after repeat syncope, leading to confusion as per HPI. > Now with worsening Encephalopathy prior to my evaluation, declining GCS/ > For now family would like patient to be full code / trial of intubation if indicated, but son who I spoke state he will need to talk to his sister about it. > Based on patient's current clincial trajectroy and risk for decompensation, will consult critical care for consideration of monitoring in ICU. > Trauma surgery consulted in the ED and have seen the patient and appeared to have signed off awaiting final note. > Neurosurgery consulted and have seen the patient, awaiting recommendations, sounds like current etiology  may be nonoperative. - Appreciate trauma surgery and neurosurgery recommendations - Consult critical care, appreciate recommendations/assistance. - Hold off  on progressive admission for now, possible ICU admission - Continue with anticoagulation reversal - N.p.o. given altered mental status - EEG - Seizure prophylaxis - Neuro Checks - Repeat CT Head in 8 hours  Syncope and  Collapse > Presented with syncope as above. > Unclear if seizure could have a role in this along with mentation changes (though may be more likely related to worsening bleed. - Will be on telemetry - Hold off on echo as this was done a month ago and working to determine clinical course/goals of care - Able to in the future, obtain orthostatic vital signs - Trend renal function and electrolytes - EEG  Atrial fibrillation - Continue metoprolol when able - Holding Eliquis as above  Hypertension - Continue home losartan, metoprolol when able  Hyperlipidemia - Continue home atorvastatin when able  Hypothyroidism - Continue home Synthroid when able  Neuropathy - Consider resumption of Lyrica when able  Obesity - Noted  OSA - History of this listed in chart but also request for renewal listed  GERD - Request for removal listed in chart  DVT prophylaxis: SCDs for now Code Status:   Full for now, family to discuss.  Currently appears to be at risk for needing intubation for airway protection. Family Communication:  Updated at bedside. Disposition Plan:   Patient is from:  Home  Anticipated DC to:  Pending clinical course  Anticipated DC date:  Pending clinical course  Anticipated DC barriers: None  Consults called:  Trauma surgery, neurosurgery consulted in the ED.  I consulted critical care for consideration of ICU monitoring due to worsening mental status and high risk for inability to protect airway. Admission status:  Inpatient, likely ICU (if not will go to progressive unit)  Severity of  Illness: The appropriate patient status for this patient is INPATIENT. Inpatient status is judged to be reasonable and necessary in order to provide the required intensity of service to ensure the patient's safety. The patient's presenting symptoms, physical exam findings, and initial radiographic and laboratory data in the context of their chronic comorbidities is felt to place them at high risk for further clinical deterioration. Furthermore, it is not anticipated that the patient will be medically stable for discharge from the hospital within 2 midnights of admission.   * I certify that at the point of admission it is my clinical judgment that the patient will require inpatient hospital care spanning beyond 2 midnights from the point of admission due to high intensity of service, high risk for further deterioration and high frequency of surveillance required.Synetta Fail MD [facility]  How to contact the [facility] Attending or Consulting provider 7A - 7P or covering provider during after hours 7P -7A, for this patient?   Check the care team in [facility] and look for a) attending/consulting [facility] provider listed and b) the [facility] team listed Log into [email or web address] and use [place]'s universal password to access. If you do not have the password, please contact the hospital operator. Locate the [facility] provider you are looking for under [facility] and page to a number that you can be directly reached. If you still have difficulty reaching the provider, please page the [facility] (Director on Call) for the Hospitalists listed on amion for assistance.  05/08/2023, 3:05 PM

## 2023-04-22 NOTE — H&P (Signed)
[name] Oct 06, 1934  [id number].    Requesting MD: Eber Hong, MD Chief Complaint/Reason for Consult: Syncope, head injury on blood thinners, level 2 trauma  HPI: Glenn Ortega is a 89 year old male with a history of A. Fib on Eliquis (unsure of last done - family at bedside unsure, pt only oriented to self), HTN and HLD who presented to the ED after syncopal episode.  Per chart patient thinks he had a syncopal episode last night and woke up on the dining room floor with a knot on the side of his head. He is unsure how long he was out for.  Associated HA and n/v.  Patient underwent workup including CTH that showed no acute intra-cranial abnormality. He was d/c'd home.  Paramedics were called to the his home today because he had another witnessed syncopal event. Patient found to be disorientated and amnestic to the event. Continues to have nausea and is now vomiting blood.  In the ED he has been afebrile and without hypotension. Noted to be bradycardic (on 12.5mg  Metoprolol XL at home, last dose ). Labs reassuring with normal wbc, lactic, tn x 2, electrolytes and creatinine. INR 1.4. No hypoglycemia. Imaging reveals R parietal SDH, prepontine cisterns SAH  extending into the bilateral Sylvian fissures as well as the bilateral frontal lobe sulci. CTA head and neck obtained that showed no evidence of an intracranial aneurysm, LVO, or evidence of acute traumatic injury in the neck. He was noted to also have nondisplaced fracture through the left temporal bone at the level of the sigmoid plate with small venous epidural hematoma.   ROS: Review of Systems  Unable to perform ROS: Mental status change   As above.   Family History  Problem Relation Age of Onset   Hypertension Other    Cancer Mother        bone?   Myasthenia gravis Father    Obesity Father    Heart disease Brother     Past Medical History:  Diagnosis Date   Atrial fibrillation (HCC)    Back pain    GERD  (gastroesophageal reflux disease)    Hyperlipidemia    Hypertension    EKG and chest x ray 8/12 [facility]/states had stress test 2 yrs ago- doesnt remember where- not in EP[facility]   Spinal stenosis    lumbar   Thyroid trouble     Past Surgical History:  Procedure Laterality Date   CARPAL TUNNEL RELEASE Right    09/2021   COLONOSCOPY     CYSTOSCOPY  12/30/2011   Procedure: CYSTOSCOPY;  Surgeon: Martina Sinner, MD;  Location: [facility];  Service: Urology;  Laterality: N/A;   EYE SURGERY Bilateral 06/18/2017   cataract sx with lens implant   HERNIA REPAIR  2013   HERNIA REPAIR Right    age  37   LUMBAR LAMINECTOMY/DECOMPRESSION MICRODISCECTOMY N/A 11/09/2013   Procedure: Lumbar Laminectomy/Decompression, Microdiscectomy Lumbar Three-Four, Four-Five, with Coflex;  Surgeon: Reinaldo Meeker, MD;  Location: [facility];  Service: Neurosurgery;  Laterality: N/A;  Lumbar Laminectomy/Decompression, Microdiscectomy Lumbar Three-Four, Four-Five, with Coflex   PAROTIDECTOMY Left 11/17/2015   Procedure: LEFT PAROTIDECTOMY;  Surgeon: Serena Colonel, MD;  Location: [facility];  Service: ENT;  Laterality: Left;   PROSTATE SURGERY  2013   SALIVARY GLAND SURGERY     TONSILLECTOMY     trigger thumb  1980   right    Social History:  reports that he has never smoked. He  has never used smokeless tobacco. He reports current alcohol use of about 2.0 standard drinks of alcohol per week. He reports that he does not use drugs.  Allergies:  Allergies  Allergen Reactions   Penicillins Swelling    CHILDHOOD ALLERGY Has patient had a PCN reaction causing immediate rash, facial/tongue/throat swelling, SOB or lightheadedness with hypotension: Yes Has patient had a PCN reaction causing severe rash involving mucus membranes or skin necrosis: No Has patient had a PCN reaction that required hospitalization: No Has patient had a PCN reaction occurring within the last 10 years: No If all of the above answers  are "NO", then may proceed with Cephalosporin use.    Gabapentin Other (See Comments)    "MADE ME FEEL CRAZY"   Lisinopril Cough   Penicillin G Hives    (Not in a hospital admission)    Physical Exam: Blood pressure (S) 126/78, temperature (S) (!) 97.5 F (36.4 C), temperature source (S) Oral, resp. rate (S) 20, height 5\' 7"  (1.702 m), weight 92.1 kg, SpO2 (S) 97 %. General: pleasant, WD/WN male who is laying in bed in NAD, pulling at leads, confused  HEENT: Scalp hematoma noted. Sclera are noninjected.  PERRLA.  Ears and nose without any masses or lesions.  Mouth is pink and moist - no blood noted in oropharynx.  Neck: C-Collar removed.  Heart:tachycardic with irregular rhythm.  Palpable radial and pedal pulses bilaterally  Lungs: respiratory effort non-labored. Mild expiratory wheezes bilaterally, rhonchi on right. No chest wall pain or hematoma. Abd:  Soft, protuberant, NT/ND, +BS MS: No obvious deformity to BUE or BLE. MAE's without reported pain. No ttp over major joints of BUE and BLE's.  Skin: warm and dry  Psych: A&Ox1  Neuro: non-focal exam. Following commands. Inappropriate speech.    Results for orders placed or performed during the hospital encounter of 04/28/2023 (from the past 48 hour(s))  Troponin I (High Sensitivity)     Status: None   Collection Time: 04/19/2023 12:49 PM  Result Value Ref Range   Troponin I (High Sensitivity) 16 <18 ng/L    Comment: (NOTE) Elevated high sensitivity troponin I (hsTnI) values and significant  changes across serial measurements may suggest ACS but many other  chronic and acute conditions are known to elevate hsTnI results.  Refer to the "Links" section for chest pain algorithms and additional  guidance. Performed at [facility], [address] [place], [place] [address]   CBC with Differential     Status: Abnormal   Collection Time: 05/04/2023 12:49 PM  Result Value Ref Range   WBC 9.8 4.0 - 10.5 K/uL   RBC 4.84 4.22 -  5.81 MIL/uL   Hemoglobin 14.4 13.0 - 17.0 g/dL   HCT 60.4 54.0 - 98.1 %   MCV 89.5 80.0 - 100.0 fL   MCH 29.8 26.0 - 34.0 pg   MCHC 33.3 30.0 - 36.0 g/dL   RDW 19.1 (H) 47.8 - 29.5 %   Platelets 157 150 - 400 K/uL   nRBC 0.0 0.0 - 0.2 %   Neutrophils Relative % 69 %   Neutro Abs 6.6 1.7 - 7.7 K/uL   Lymphocytes Relative 21 %   Lymphs Abs 2.1 0.7 - 4.0 K/uL   Monocytes Relative 8 %   Monocytes Absolute 0.8 0.1 - 1.0 K/uL   Eosinophils Relative 1 %   Eosinophils Absolute 0.1 0.0 - 0.5 K/uL   Basophils Relative 0 %   Basophils Absolute 0.0 0.0 - 0.1 K/uL  Immature Granulocytes 1 %   Abs Immature Granulocytes 0.09 (H) 0.00 - 0.07 K/uL    Comment: Performed at [facility], [address], [place], [place] [address]  Comprehensive metabolic panel     Status: Abnormal   Collection Time: 04/23/2023 12:49 PM  Result Value Ref Range   Sodium 140 135 - 145 mmol/L   Potassium 4.0 3.5 - 5.1 mmol/L   Chloride 109 98 - 111 mmol/L   CO2 19 (L) 22 - 32 mmol/L   Glucose, Bld 113 (H) 70 - 99 mg/dL    Comment: Glucose reference range applies only to samples taken after fasting for at least 8 hours.   BUN 15 8 - 23 mg/dL   Creatinine, Ser 6.04 0.61 - 1.24 mg/dL   Calcium 8.5 (L) 8.9 - 10.3 mg/dL   Total Protein 6.7 6.5 - 8.1 g/dL   Albumin 3.7 3.5 - 5.0 g/dL   AST 26 15 - 41 U/L   ALT 16 0 - 44 U/L   Alkaline Phosphatase 44 38 - 126 U/L   Total Bilirubin 1.6 (H) 0.3 - 1.2 mg/dL   GFR, Estimated >54 >09 mL/min    Comment: (NOTE) Calculated using the CKD-EPI Creatinine Equation (2021)    Anion gap 12 5 - 15    Comment: Performed at [facility], [address] [place], [place] [address]  Magnesium     Status: None   Collection Time: 05/04/2023 12:49 PM  Result Value Ref Range   Magnesium 1.9 1.7 - 2.4 mg/dL    Comment: Performed at [facility], [address], [place], [place] [address]  Protime-INR     Status: Abnormal   Collection Time: 05/05/2023 12:49 PM  Result  Value Ref Range   Prothrombin Time 17.7 (H) 11.4 - 15.2 seconds   INR 1.4 (H) 0.8 - 1.2    Comment: (NOTE) INR goal varies based on device and disease states. Performed at [facility], [address] [place], [place] [address]    DG Chest Port 1 View  Result Date: 05/08/2023 CLINICAL DATA:  Altered mental status EXAM: PORTABLE CHEST 1 VIEW COMPARISON:  04/20/2023 FINDINGS: No consolidation, pneumothorax or effusion. Underinflated. Enlarged cardiopericardial silhouette with some prominence of the central vasculature. Overlapping cardiac leads. Film is rotated to the left. Degenerative changes of the spine IMPRESSION: Underinflation. Enlarged heart with some central vascular congestion. Electronically Signed   By: Karen Kays M.D.   On: 05/15/2023 13:07   DG Chest Port 1 View  Result Date: 05/16/2023 CLINICAL DATA:  syncope, hiccups EXAM: PORTABLE CHEST 1 VIEW COMPARISON:  CXR 06/28/21 FINDINGS: No pleural effusion. No pneumothorax. No focal airspace opacity. Cardiomegaly. Compared to prior exam there is increased prominence of the right hilum, which may be secondary to pulmonary venous congestion or atypical infection. No radiographically apparent displaced rib fractures. Visualized upper abdomen is unremarkable. IMPRESSION: Increased prominence of the right hilum, which may be secondary to pulmonary venous congestion or atypical infection. If clinically indicated, this could be further assessed with a chest CT. Otherwise recommend follow-up chest radiograph in 6-8 weeks to ensure resolution. Electronically Signed   By: Lorenza Cambridge M.D.   On: 04/30/2023 10:09   CT Head Wo Contrast  Result Date: 04/27/2023 CLINICAL DATA:  Provided history: Head trauma, minor. EXAM: CT HEAD WITHOUT CONTRAST TECHNIQUE: Contiguous axial images were obtained from the base of the skull through the vertex without intravenous contrast. RADIATION DOSE REDUCTION: This  exam was performed according to the departmental  dose-optimization program which includes automated exposure control, adjustment of the mA and/or kV according to patient size and/or use of iterative reconstruction technique. COMPARISON:  Brain MRI 06/28/2021.  Head CT 06/27/2021. FINDINGS: Brain: Mild generalized cerebral atrophy. Mild chronic small vessel ischemic changes within the cerebral white matter, better appreciated on the prior brain MRI of 06/28/2021. There is no acute intracranial hemorrhage. No demarcated cortical infarct. No extra-axial fluid collection. No evidence of an intracranial mass. No midline shift. Vascular: No hyperdense vessel.  Atherosclerotic calcifications. Skull: No calvarial fracture or aggressive osseous lesion. Sinuses/Orbits: No mass or acute finding within the imaged orbits. No significant paranasal sinus disease. Other: Right parietal scalp hematoma. Prior left parotidectomy. Indeterminate 11 mm cystic lesion within the parotidectomy bed, unchanged in size from the prior brain MRI of 06/28/2021. IMPRESSION: 1.  No evidence of an acute intracranial abnormality. 2. Right parietal scalp hematoma. 3. Mild chronic small vessel ischemic disease within the cerebral white matter. 4. Mild generalized cerebral atrophy. 5. Prior left parotidectomy. Indeterminate 11 mm cystic lesion within the parotidectomy bed, unchanged in size from the prior brain MRI of 06/28/2021. Please refer to the prior brain MRI for further description and for recommendations. Electronically Signed   By: Jackey Loge D.O.   On: 04/27/2023 10:05    Anti-infectives (From admission, onward)    None       Assessment/Plan Syncope leading to [facility] 7/5 TBI/R parietal SDH/prepontine cisterns SAH - CTA with no evidence of intracranial aneurysm or LVO. Reversing with andexxa. NSGY, Dr. Conchita Paris, consulted. Await recs L temporal bone fx with small venous EDH - Per NSGY. Defer to them if needs dedicated CT temporal bone and/or CTV of the head as recommended by  Radiology.  Scalp hematoma - ice C-Spine - CT C-spine and CTA neck negative for acute injury Hematemesis - CT chest/abd/pelvis. Also suspect he has an aspiration PNA given emesis, rhonchi on exam, intermittent hypoxia.  Hx A. Fib on Eliquis (last dose unclear) - reverse with andexxa as above Hx HTN - Defer to NSGY on BP goals. Adjust per their recs.  Hx HLD FEN - NPO, IVF  VTE - SCDs, on hold for TBI ID - None Foley - None currently  Dispo - Admit to CCM vs TRH. We will get CT CAP for completeness. If this is negative, trauma will likely sign off . No evidence of extremity trauma.  I reviewed nursing notes, ED provider notes, last 24 h vitals and pain scores, last 48 h intake and output, last 24 h labs and trends, and last 24 h imaging results  Hosie Spangle, [facility] Surgery 05/16/2023, 1:54 PM Please see Amion for pager number during day hours 7:00am-4:30pm

## 2023-04-23 ENCOUNTER — Other Ambulatory Visit: Payer: Self-pay

## 2023-04-23 ENCOUNTER — Inpatient Hospital Stay (HOSPITAL_COMMUNITY): Payer: PPO

## 2023-04-23 DIAGNOSIS — I609 Nontraumatic subarachnoid hemorrhage, unspecified: Secondary | ICD-10-CM | POA: Diagnosis not present

## 2023-04-23 DIAGNOSIS — R569 Unspecified convulsions: Secondary | ICD-10-CM

## 2023-04-23 LAB — PHOSPHORUS: Phosphorus: 3 mg/dL (ref 2.5–4.6)

## 2023-04-23 LAB — COMPREHENSIVE METABOLIC PANEL
ALT: 15 U/L (ref 0–44)
AST: 20 U/L (ref 15–41)
Albumin: 3.5 g/dL (ref 3.5–5.0)
Alkaline Phosphatase: 39 U/L (ref 38–126)
Anion gap: 10 (ref 5–15)
BUN: 15 mg/dL (ref 8–23)
CO2: 21 mmol/L — ABNORMAL LOW (ref 22–32)
Calcium: 8.5 mg/dL — ABNORMAL LOW (ref 8.9–10.3)
Chloride: 106 mmol/L (ref 98–111)
Creatinine, Ser: 1.07 mg/dL (ref 0.61–1.24)
GFR, Estimated: >60 mL/min (ref >60)
Glucose, Bld: 158 mg/dL — ABNORMAL HIGH (ref 70–99)
Potassium: 3.8 mmol/L (ref 3.5–5.1)
Sodium: 137 mmol/L (ref 135–145)
Total Bilirubin: 1.5 mg/dL — ABNORMAL HIGH (ref 0.3–1.2)
Total Protein: 6.5 g/dL (ref 6.5–8.1)

## 2023-04-23 LAB — GLUCOSE, CAPILLARY
Glucose-Capillary: 158 mg/dL — ABNORMAL HIGH (ref 70–99)
Glucose-Capillary: 137 mg/dL — ABNORMAL HIGH (ref 70–99)
Glucose-Capillary: 134 mg/dL — ABNORMAL HIGH (ref 70–99)
Glucose-Capillary: 107 mg/dL — ABNORMAL HIGH (ref 70–99)
Glucose-Capillary: 96 mg/dL (ref 70–99)
Glucose-Capillary: 107 mg/dL — ABNORMAL HIGH (ref 70–99)

## 2023-04-23 LAB — CBC
HCT: 39.7 % (ref 39.0–52.0)
Hemoglobin: 13.7 g/dL (ref 13.0–17.0)
MCH: 29.1 pg (ref 26.0–34.0)
MCHC: 34.5 g/dL (ref 30.0–36.0)
MCV: 84.5 fL (ref 80.0–100.0)
Platelets: 162 10*3/uL (ref 150–400)
RBC: 4.7 MIL/uL (ref 4.22–5.81)
RDW: 15.4 % (ref 11.5–15.5)
WBC: 13.3 10*3/uL — ABNORMAL HIGH (ref 4.0–10.5)
nRBC: 0 % (ref 0.0–0.2)

## 2023-04-23 LAB — APTT: aPTT: 34 seconds (ref 24–36)

## 2023-04-23 LAB — SODIUM
Sodium: 140 mmol/L (ref 135–145)
Sodium: 146 mmol/L — ABNORMAL HIGH (ref 135–145)

## 2023-04-23 LAB — PROTIME-INR
INR: 1.5 — ABNORMAL HIGH (ref 0.8–1.2)
Prothrombin Time: 17.9 seconds — ABNORMAL HIGH (ref 11.4–15.2)

## 2023-04-23 LAB — MAGNESIUM: Magnesium: 1.9 mg/dL (ref 1.7–2.4)

## 2023-04-23 MED ORDER — SODIUM CHLORIDE 0.9% FLUSH
10.0000 mL | Freq: Two times a day (BID) | INTRAVENOUS | Status: DC
  Administered 2023-04-23: 10 mL
  Administered 2023-04-24: 20 mL

## 2023-04-23 MED ORDER — SODIUM CHLORIDE 3 % IV SOLN
INTRAVENOUS | Status: DC
  Filled 2023-04-23 (×3): qty 500

## 2023-04-23 MED ORDER — SODIUM CHLORIDE 3 % IV SOLN
INTRAVENOUS | Status: DC
  Filled 2023-04-23: qty 500

## 2023-04-23 MED ORDER — SODIUM CHLORIDE 3 % IV BOLUS
250.0000 mL | Freq: Once | INTRAVENOUS | Status: AC
  Administered 2023-04-23: 250 mL via INTRAVENOUS
  Filled 2023-04-23: qty 500

## 2023-04-23 MED ORDER — SODIUM CHLORIDE 3 % IV BOLUS
250.0000 mL | Freq: Once | INTRAVENOUS | Status: AC
  Administered 2023-04-23: 250 mL via INTRAVENOUS

## 2023-04-23 MED ORDER — SODIUM CHLORIDE 0.9% FLUSH: 10.0000 mL | INTRAVENOUS | Status: DC | PRN

## 2023-04-23 NOTE — Progress Notes (Signed)
PT Cancellation Note  Patient Details Name: Glenn Ortega MRN: [id number] DOB: 1934/07/22   Cancelled Treatment:    Reason Eval/Treat Not Completed: Patient not medically ready  Noted neurosurgery said pt does not need a brace for his lumbar spine. RN reports pt not following commands and noted incr size of SDH per imaging. Recommended defer PT eval at this time. (Pt also getting PICC line). Will reattempt 7/8.    Jerolyn Center, PT Acute Rehabilitation Services  Office [phone]  Glenn Ortega 04/23/2023, 12:49 PM

## 2023-04-23 NOTE — Progress Notes (Addendum)
NAME:  Glenn Ortega, MRN:  [id number], DOB:  Oct 20, 1933, LOS: 1 ADMISSION DATE:  04/18/2023 CONSULTATION DATE:  04/27/2023 REFERRING MD:  Alinda Money - TRH CHIEF COMPLAINT:  Syncope with fall on [facility]  History of Present Illness:  89 year old man who presented to [facility] ED 7/5 after fall at home 2/2 syncope. PMHx significant for HTN, HLD, AFib (on Eliquis), OSA, hypothyroidism, GERD, lumbar spinal stenosis.  Patient initially presented to [facility] 7/5 after a fall at home, likely 2/2 syncopal event. Noted "knot"/lump on the back of his head, on blood thinners for AFib. Initial CT Head was negative for AICA and patient was discharged home. Patient unfortunately had another syncopal event at home after discharge for which EMS was called; patient was reportedly not feeling well, went to the ground and then became unresponsive. On ED arrival, patient reported nausea, denied CP/SOB. Was breathing/did have a pulse. Confused on arrival and reportedly did not remember falling.   On ED arrival, patient was afebrile, bradycardic in 50s, hypertensive with SBP 160-170s, SpO2 99% on RA. Increasingly less responsive. Labs were notable for normal CBC, Na 140, K 4.0, CO2 19, BUN/Cr 15/1.18 (baseline), LFTs WNL with exception of Tbili 1.6. LA 1.9. Trop 15 > 16. INR 1.4. Repeat CT Head in ED 7/5 with SDH 8mm and bilateral SAH. CTA without aneurysm/LVO. NSGY was consulted and recommended AC reversal, Andexxa was administered in the ED. Trauma was consulted as well (signed off as event r/t syncope).  PCCM consulted for ICU admission.  Pertinent Medical History:   Past Medical History:  Diagnosis Date   Atrial fibrillation (HCC)    Back pain    GERD (gastroesophageal reflux disease)    Hyperlipidemia    Hypertension    EKG and chest x ray 8/12 [facility]/states had stress test 2 yrs ago- doesnt remember where- not in EPIC   Spinal stenosis    lumbar   Thyroid trouble    Significant Hospital Events: Including procedures, antibiotic  start and stop dates in addition to other pertinent events   7/5 - Presented to [facility] for syncope/fall on [facility]. CT Head NAICA. Discharged home. Recurrent syncopal episode/fall at home with decreased responsiveness. BIB EMS for reevaluation. CT Head with right SDH 8mm, bilateral SAH. NSGY consulted, Andexxa given. PCCM consulted for ICU admission.  Interim History / Subjective:   CT Head scan over night with new 6mm right convexity subdural hematoma Multifocal intraparenchymal hematoma within both temporal lobes and the right frontal lobe with leftward shift of 4mm. Persistent biconvexity subarachnoid blood.  No other acute events overnight, patient  Objective:  Blood pressure (!) 148/86, pulse 89, temperature 98.4 F (36.9 C), resp. rate (!) 25, height 5\' 7"  (1.702 m), weight 92.1 kg, SpO2 93 %.        Intake/Output Summary (Last 24 hours) at 04/23/2023 1052 Last data filed at 04/23/2023 0640 Gross per 24 hour  Intake 289.83 ml  Output 525 ml  Net -235.17 ml   Filed Weights   05/13/2023 1250  Weight: 92.1 kg   Physical Examination: General: Acutely ill-appearing elderly man in NAD HEENT: Posterior R scalp hematoma, no laceration. Anicteric sclera, PERRL Neuro: PERRL. Withdraws to pain in all 4 extremities. Not following commands.  CV: Irregularly irregular rhythm, rate 70s-90s, no m/g/r. PULM: Breathing even and unlabored on RA. Lung fields CTAB. GI: Soft, nontender, nondistended. Normoactive bowel sounds. Extremities: No LE edema noted. Skin: Warm/dry, no rashes, scattered small areas of ecchymosis.  Resolved Hospital Problem List:  Assessment & Plan:  Right subdural hematoma and bilateral subarachnoid hemorrhage and multifocal intraparenchymal hematoma with brain compression S/p syncopal episode x 2 with fall Presented to [facility] for syncope/fall on AC. CT Head NAICA. Discharged home. Recurrent syncopal episode/fall at home with decreased responsiveness. BIB EMS for reevaluation.  CT Head with right SDH 8mm, bilateral SAH. - NSGY consulted, appreciate recommendations - S/p Andexxa AC reversal - Keppra for seizure ppx - F/u EEG, no seizure activity noted to date - Repeat CT head with new multifocal intraparenchymal hematomas and midline shift - start hypertonic saline protocol, goal Na 150-155 - Frequent neurochecks - Neuroprotective measures: HOB > 30 degrees, normoglycemia, normothermia, electrolytes WNL - High risk for intubation if mental status continues to worsen  Atrial fibrillation, on Eliquis - Cardiac monitoring - Optimize electrolytes for K > 4, Mg > 2 - Hold AC at present, given acute SDH/SAH - F/u Echo  HTN HLD - Resume home antihypertensives when clinically appropriate - Resume statin  OSA - Supplemental O2 support as needed for O2 sat > 90% - CPAP PRN - Pulmonary hygiene  GERD - PPI  Hypothyroidism - Continue levothyroxine  Best Practice: (right click and "Reselect all SmartList Selections" daily)   Diet/type: NPO DVT prophylaxis: SCDs, AC contraindicated in the setting of SDH/SAH GI prophylaxis: PPI Lines: N/A Foley:  N/A Code Status:  full code Last date of multidisciplinary goals of care discussion [Pending]  Labs:  CBC: Recent Labs  Lab 04/21/2023 0910 04/27/2023 1249 04/23/23 0202  WBC 8.3 9.8 13.3*  NEUTROABS 5.7 6.6  --   HGB 14.3 14.4 13.7  HCT 43.3 43.3 39.7  MCV 85.6 89.5 84.5  PLT 163 157 162   Basic Metabolic Panel: Recent Labs  Lab 05/09/2023 0910 05/12/2023 1249 04/23/23 0202  NA 139 140 137  K 4.4 4.0 3.8  CL 107 109 106  CO2 22 19* 21*  GLUCOSE 94 113* 158*  BUN 17 15 15   CREATININE 0.96 1.18 1.07  CALCIUM 8.5* 8.5* 8.5*  MG  --  1.9 1.9  PHOS  --   --  3.0   GFR: Estimated Creatinine Clearance: 53.6 mL/min (by C-G formula based on SCr of 1.07 mg/dL). Recent Labs  Lab 05/08/2023 0910 05/09/2023 1249 05/01/2023 1717 04/23/23 0202  WBC 8.3 9.8  --  13.3*  LATICACIDVEN  --  1.9 2.5*  --    Liver  Function Tests: Recent Labs  Lab 05/06/2023 0910 05/12/2023 1249 04/23/23 0202  AST 23 26 20   ALT 12 16 15   ALKPHOS 44 44 39  BILITOT 1.6* 1.6* 1.5*  PROT 7.2 6.7 6.5  ALBUMIN 3.9 3.7 3.5   Recent Labs  Lab 04/30/2023 0910  LIPASE 45   No results for input(s): "AMMONIA" in the last 168 hours.    Critical care time:    The patient is critically ill with multiple organ system failure and requires high complexity decision making for assessment and support, frequent evaluation and titration of therapies, advanced monitoring, review of radiographic studies and interpretation of complex data.   Critical Care Time devoted to patient care services, exclusive of separately billable procedures, described in this note is 40 minutes.  Melody Comas, MD [facility] Office: [phone]   See Amion for personal pager PCCM on call pager [phone] until 7pm. Please call Elink 7p-7a. [phone]

## 2023-04-23 NOTE — Plan of Care (Addendum)
Progressing toward goals.   Drew sodium level and sent down to lab at 1830, has not resulted yet.

## 2023-04-23 NOTE — Progress Notes (Signed)
Peripherally Inserted Central Catheter Placement  The IV Nurse has discussed with the patient and/or persons authorized to consent for the patient, the purpose of this procedure and the potential benefits and risks involved with this procedure.  The benefits include less needle sticks, lab draws from the catheter, and the patient may be discharged home with the catheter. Risks include, but not limited to, infection, bleeding, blood clot (thrombus formation), and puncture of an artery; nerve damage and irregular heartbeat and possibility to perform a PICC exchange if needed/ordered by physician.  Alternatives to this procedure were also discussed.  Bard Power PICC patient education guide, fact sheet on infection prevention and patient information card has been provided to patient /or left at bedside. Consent signed by daughter at bedside.   PICC Placement Documentation  PICC Double Lumen 04/23/23 Right Basilic 41 cm 0 cm (Active)  Indication for Insertion or Continuance of Line Administration of hyperosmolar/irritating solutions (i.e. TPN, Vancomycin, etc.) 04/23/23 1359  Exposed Catheter (cm) 0 cm 04/23/23 1359  Site Assessment Clean, Dry, Intact 04/23/23 1359  Lumen #1 Status Saline locked;Flushed;Blood return noted 04/23/23 1359  Lumen #2 Status Saline locked;Flushed;Blood return noted 04/23/23 1359  Dressing Type Transparent;Securing device 04/23/23 1359  Dressing Status Antimicrobial disc in place;Clean, Dry, Intact 04/23/23 1359  Safety Lock Not Applicable 04/23/23 1359  Line Care Connections checked and tightened 04/23/23 1359  Line Adjustment (NICU/IV Team Only) No 04/23/23 1359  Dressing Intervention New dressing 04/23/23 1359  Dressing Change Due 04/30/23 04/23/23 1359       Burnard Bunting Chenice 04/23/2023, 2:00 PM

## 2023-04-23 NOTE — Progress Notes (Signed)
   Providing Compassionate, Quality Care - Together  NEUROSURGERY PROGRESS NOTE   S: No issues overnight.  Repeat CT shows significant enlargement of contusions with subdural hematoma and now some midline shift  O: EXAM:  BP (!) 148/86 (BP Location: Right Arm)   Pulse 89   Temp 98.4 F (36.9 C)   Resp (!) 25   Ht 5\' 7"  (1.702 m)   Wt 92.1 kg   SpO2 93%   BMI 31.79 kg/m   Eyes closed, Pupils equally round reactive to light Does not follow commands Speaks individual words, somewhat incomprehensible Moves all extremities  ASSESSMENT:  89 y.o. male with   Traumatic subdural hematoma, contusions, subarachnoid hemorrhage L2, L5 superior endplate fracture, age-indeterminate  PLAN: -Had a discussion with the family today.  I discussed the findings of the worsening CT brain.  Again do not recommend any surgical intervention even if there is progression of his intracranial pathology given his age and comorbidities -Continue supportive care -No bracing needed for lumbar superior plate fractures    Thank you for allowing me to participate in this patient's care.  Please do not hesitate to call with questions or concerns.   Monia Pouch, DO Neurosurgeon [facility] Cell: [phone]

## 2023-04-23 NOTE — Procedures (Signed)
Patient Name: CAESYN WARNE  MRN: [id number]  Epilepsy Attending: Charlsie Quest  Referring Physician/Provider: Lisbeth Renshaw, MD  Duration: 05/11/2023 2053 to 76/2024 0730  Patient history: 89yo M with h/o syncope. CTH showed SAH, SDH and EDH. EEG to evaluate for seizure.  Level of alertness: Awake, asleep  AEDs during EEG study: LEV  Technical aspects: This EEG study was done with scalp electrodes positioned according to the 10-20 International system of electrode placement. Electrical activity was reviewed with band pass filter of 1-70Hz , sensitivity of 7 uV/mm, display speed of 14mm/sec with a 60Hz  notched filter applied as appropriate. EEG data were recorded continuously and digitally stored.  Video monitoring was available and reviewed as appropriate.  Description: The posterior dominant rhythm consists of 8-9 Hz activity of moderate voltage (25-35 uV) seen predominantly in posterior head regions, symmetric and reactive to eye opening and eye closing. Sleep was characterized by vertex waves, sleep spindles (12 to 14 Hz), maximal frontocentral region. EEG showed continuous 3 to 6 Hz theta-delta slowing in right temporal region. Intermittent generalized 3-6hz  theta-delta slowing was also noted.  Hyperventilation and photic stimulation were not performed.     ABNORMALITY - Continuous slow, right temporal region - Intermittent slow, generalized  IMPRESSION: This study is suggestive of cortical dysfunction arising from right temporal region likely secondary to underlying structural abnormality. Additionally there is mild to moderate diffuse encephalopathy. No seizures or epileptiform discharges were seen throughout the recording.  Mahayla Haddaway Annabelle Harman

## 2023-04-23 NOTE — Evaluation (Signed)
Clinical/Bedside Swallow Evaluation Patient Details  Name: Glenn Ortega MRN: [id number] Date of Birth: 01/29/1934  Today's Date: 04/23/2023 Time: SLP Start Time (ACUTE ONLY): 4098 SLP Stop Time (ACUTE ONLY): 1015 SLP Time Calculation (min) (ACUTE ONLY): 19 min  Past Medical History:  Past Medical History:  Diagnosis Date   Atrial fibrillation (HCC)    Back pain    GERD (gastroesophageal reflux disease)    Hyperlipidemia    Hypertension    EKG and chest x ray 8/12 [facility]/states had stress test 2 yrs ago- doesnt remember where- not in EPIC   Spinal stenosis    lumbar   Thyroid trouble    Past Surgical History:  Past Surgical History:  Procedure Laterality Date   CARPAL TUNNEL RELEASE Right    09/2021   COLONOSCOPY     CYSTOSCOPY  12/30/2011   Procedure: CYSTOSCOPY;  Surgeon: Martina Sinner, MD;  Location: [facility];  Service: Urology;  Laterality: N/A;   EYE SURGERY Bilateral 06/18/2017   cataract sx with lens implant   HERNIA REPAIR  2013   HERNIA REPAIR Right    age  6   LUMBAR LAMINECTOMY/DECOMPRESSION MICRODISCECTOMY N/A 11/09/2013   Procedure: Lumbar Laminectomy/Decompression, Microdiscectomy Lumbar Three-Four, Four-Five, with Coflex;  Surgeon: Reinaldo Meeker, MD;  Location: [facility];  Service: Neurosurgery;  Laterality: N/A;  Lumbar Laminectomy/Decompression, Microdiscectomy Lumbar Three-Four, Four-Five, with Coflex   PAROTIDECTOMY Left 11/17/2015   Procedure: LEFT PAROTIDECTOMY;  Surgeon: Serena Colonel, MD;  Location: [facility];  Service: ENT;  Laterality: Left;   PROSTATE SURGERY  2013   SALIVARY GLAND SURGERY     TONSILLECTOMY     trigger thumb  1980   right   HPI:  89 year old male s/p fall with Traumatic subdural hematoma, contusions, subarachnoid hemorrhage and L2, L5 superior endplate fracture, age-indeterminate. Neurosurgery consulted "discussed the findings of the worsening CT brain.  Again do not recommend any surgical intervention even if  there is progression of his intracranial pathology given his age and comorbidities. Recommending continue supportive care, no bracing needed for lumbar superior plate fractures."    Assessment / Plan / Recommendation  Clinical Impression  Pt presents with a cognitive based dysphagia presently. Pt unable to open eyes with cueing, however pt responsive to oral care and able to move extremities some as well as vocalize (though garbled word level language currently). Provided diligent oral care. Pt with baseline upper and lower dentures in place (removed, cleaned, labeled at bedside). Recommend leaving dentures out to assist in improving oral hygiene until mentation improves for PO readiness. Reduced management of saliva exhibited with dried blood tinged secretions in oral cavity and xerostomia (posterior tongue, palatal, bilateral buccal regions). Oral hygiene, improved post oral care by SLP. Attempted single ice chip to anterior oral cavity and 1/4 tsp puree to anterior oral cavity. Pt without active bolus manipulation (unable to propel to posterior oral cavity for swallow sequening. Extracted ice chip and puree with suction, cleaned mouth. Recommend continue NPO with oral care QID. Per RN, pt likely to begin short term alternative nutrition with lack of readiness for PO this date.  SLP to closely monitor for PO readiness.  SLP Visit Diagnosis: Dysphagia, unspecified (R13.10)    Aspiration Risk  Mild aspiration risk;Moderate aspiration risk;Risk for inadequate nutrition/hydration    Diet Recommendation   NPO  Medication Administration: Via alternative means    Other  Recommendations Oral Care Recommendations: Oral care QID    Recommendations for follow  up therapy are one component of a multi-disciplinary discharge planning process, led by the attending physician.  Recommendations may be updated based on patient status, additional functional criteria and insurance authorization.  Follow up  Recommendations Other (comment) (TBD)      Assistance Recommended at Discharge    Functional Status Assessment Patient has had a recent decline in their functional status and/or demonstrates limited ability to make significant improvements in function in a reasonable and predictable amount of time  Frequency and Duration min 2x/week  2 weeks       Prognosis Prognosis for improved oropharyngeal function: Fair Barriers to Reach Goals: Cognitive deficits;Time post onset      Swallow Study   General Date of Onset: 04/29/2023 HPI: 89 year old male s/p fall with Traumatic subdural hematoma, contusions, subarachnoid hemorrhage and L2, L5 superior endplate fracture, age-indeterminate. Neurosurgery consulted "discussed the findings of the worsening CT brain.  Again do not recommend any surgical intervention even if there is progression of his intracranial pathology given his age and comorbidities. Recommending continue supportive care, no bracing needed for lumbar superior plate fractures." Type of Study: Bedside Swallow Evaluation Previous Swallow Assessment: none on file Diet Prior to this Study: NPO Temperature Spikes Noted: No Respiratory Status: Nasal cannula History of Recent Intubation: No Behavior/Cognition: Lethargic/Drowsy;Doesn't follow directions Oral Cavity Assessment: Dry;Dried secretions Oral Care Completed by SLP: Yes Oral Cavity - Dentition: Dentures, top;Dentures, bottom (removed for cleaning, labeled left at bedside; RN & family aware) Vision: Impaired for self-feeding Self-Feeding Abilities: Total assist Patient Positioning: Upright in bed Baseline Vocal Quality: Low vocal intensity (attempts to communicate word level, not comprehensible) Volitional Cough: Cognitively unable to elicit Volitional Swallow: Unable to elicit    Oral/Motor/Sensory Function Overall Oral Motor/Sensory Function: Other (comment) (unable to follow commands for oral motor examination)   Ice Chips  Ice chips: Impaired Presentation: Spoon Oral Phase Impairments: Poor awareness of bolus;Reduced lingual movement/coordination Oral Phase Functional Implications: Oral holding (extracted single ice chip in anterior oral cavity following no bolus manipulation from patient) Pharyngeal Phase Impairments: Unable to trigger swallow   Thin Liquid Thin Liquid: Not tested    Nectar Thick Nectar Thick Liquid: Not tested   Honey Thick Honey Thick Liquid: Not tested   Puree Puree: Impaired Presentation: Spoon Oral Phase Impairments: Poor awareness of bolus;Reduced lingual movement/coordination Oral Phase Functional Implications: Oral holding (suctioned 1/4 tsp puree from anterior oral cavity) Pharyngeal Phase Impairments: Unable to trigger swallow   Solid     Solid: Not tested      Ardyth Gal MA, CCC-SLP Acute Rehabilitation Services   04/23/2023,10:34 AM

## 2023-04-23 NOTE — Progress Notes (Signed)
PT Cancellation Note  Patient Details Name: Glenn Ortega MRN: [id number] DOB: [birth year]   Cancelled Treatment:    Reason Eval/Treat Not Completed: Other (comment)  Noted trauma service deferred to neurosurgery whether pt should be mobilizing and whether he needs a LSO due to L2-5 fxs. Awaiting neurosurgery input prior to mobilizing with PT.    Jerolyn Center, PT Acute Rehabilitation Services  Office [phone]  Zena Amos 04/23/2023, 7:44 AM

## 2023-04-23 NOTE — Progress Notes (Signed)
OT Cancellation Note  Patient Details Name: Glenn Ortega MRN: [id number] DOB: October 05, 1934   Cancelled Treatment:    Reason Eval/Treat Not Completed: Medical issues which prohibited therapy.  Neurosurgery said pt does not need a brace for his lumbar spine. RN reports pt not following commands and noted incr size of SDH per imaging. Will reattempt 7/8.   Johann Santone D Delonda Coley 04/23/2023, 6:47 PM 04/23/2023  RP, OTR/L  Acute Rehabilitation Services  Office:  [phone]

## 2023-04-24 ENCOUNTER — Inpatient Hospital Stay (HOSPITAL_COMMUNITY): Payer: PPO

## 2023-04-24 DIAGNOSIS — F0781 Postconcussional syndrome: Secondary | ICD-10-CM

## 2023-04-24 LAB — CBC WITH DIFFERENTIAL/PLATELET
Abs Immature Granulocytes: 0.11 10*3/uL — ABNORMAL HIGH (ref 0.00–0.07)
Basophils Absolute: 0 10*3/uL (ref 0.0–0.1)
Basophils Relative: 0 %
Eosinophils Absolute: 0 10*3/uL (ref 0.0–0.5)
Eosinophils Relative: 0 %
HCT: 35.6 % — ABNORMAL LOW (ref 39.0–52.0)
Hemoglobin: 11.5 g/dL — ABNORMAL LOW (ref 13.0–17.0)
Immature Granulocytes: 1 %
Lymphocytes Relative: 5 %
Lymphs Abs: 0.8 10*3/uL (ref 0.7–4.0)
MCH: 28.3 pg (ref 26.0–34.0)
MCHC: 32.3 g/dL (ref 30.0–36.0)
MCV: 87.5 fL (ref 80.0–100.0)
Monocytes Absolute: 1.7 10*3/uL — ABNORMAL HIGH (ref 0.1–1.0)
Monocytes Relative: 11 %
Neutro Abs: 13.3 10*3/uL — ABNORMAL HIGH (ref 1.7–7.7)
Neutrophils Relative %: 83 %
Platelets: 123 10*3/uL — ABNORMAL LOW (ref 150–400)
RBC: 4.07 MIL/uL — ABNORMAL LOW (ref 4.22–5.81)
RDW: 15.8 % — ABNORMAL HIGH (ref 11.5–15.5)
WBC: 16 10*3/uL — ABNORMAL HIGH (ref 4.0–10.5)
nRBC: 0 % (ref 0.0–0.2)

## 2023-04-24 LAB — GLUCOSE, CAPILLARY
Glucose-Capillary: 115 mg/dL — ABNORMAL HIGH (ref 70–99)
Glucose-Capillary: 140 mg/dL — ABNORMAL HIGH (ref 70–99)
Glucose-Capillary: 111 mg/dL — ABNORMAL HIGH (ref 70–99)
Glucose-Capillary: 117 mg/dL — ABNORMAL HIGH (ref 70–99)
Glucose-Capillary: 102 mg/dL — ABNORMAL HIGH (ref 70–99)

## 2023-04-24 LAB — BASIC METABOLIC PANEL
Anion gap: 8 (ref 5–15)
BUN: 21 mg/dL (ref 8–23)
CO2: 19 mmol/L — ABNORMAL LOW (ref 22–32)
Calcium: 8.2 mg/dL — ABNORMAL LOW (ref 8.9–10.3)
Chloride: 129 mmol/L — ABNORMAL HIGH (ref 98–111)
Creatinine, Ser: 1.17 mg/dL (ref 0.61–1.24)
GFR, Estimated: >60 mL/min (ref >60)
Glucose, Bld: 134 mg/dL — ABNORMAL HIGH (ref 70–99)
Potassium: 3.6 mmol/L (ref 3.5–5.1)
Sodium: 156 mmol/L — ABNORMAL HIGH (ref 135–145)

## 2023-04-24 LAB — SODIUM
Sodium: 149 mmol/L — ABNORMAL HIGH (ref 135–145)
Sodium: 151 mmol/L — ABNORMAL HIGH (ref 135–145)
Sodium: 160 mmol/L — ABNORMAL HIGH (ref 135–145)

## 2023-04-24 LAB — MAGNESIUM: Magnesium: 2.1 mg/dL (ref 1.7–2.4)

## 2023-04-24 LAB — PHOSPHORUS: Phosphorus: 2.9 mg/dL (ref 2.5–4.6)

## 2023-04-24 MED ORDER — HYDRALAZINE HCL 20 MG/ML IJ SOLN
10.0000 mg | Freq: Once | INTRAMUSCULAR | Status: AC
  Administered 2023-04-24: 10 mg via INTRAVENOUS
  Filled 2023-04-24: qty 1

## 2023-04-24 MED ORDER — NICARDIPINE HCL IN NACL 20-0.86 MG/200ML-% IV SOLN
0.0000 mg/h | INTRAVENOUS | Status: DC
  Filled 2023-04-24: qty 200

## 2023-04-24 MED ORDER — HALOPERIDOL LACTATE 5 MG/ML IJ SOLN: 2.5000 mg | INTRAMUSCULAR | Status: DC | PRN

## 2023-04-24 MED ORDER — IPRATROPIUM-ALBUTEROL 0.5-2.5 (3) MG/3ML IN SOLN
3.0000 mL | Freq: Four times a day (QID) | RESPIRATORY_TRACT | Status: DC | PRN
  Administered 2023-04-24: 3 mL via RESPIRATORY_TRACT
  Filled 2023-04-24: qty 3

## 2023-04-24 MED ORDER — ONDANSETRON HCL 4 MG/2ML IJ SOLN: 4.0000 mg | Freq: Four times a day (QID) | INTRAMUSCULAR | Status: DC | PRN

## 2023-04-24 MED ORDER — ACETAMINOPHEN 325 MG PO TABS: 650.0000 mg | ORAL_TABLET | Freq: Four times a day (QID) | ORAL | Status: DC | PRN

## 2023-04-24 MED ORDER — FENTANYL CITRATE PF 50 MCG/ML IJ SOSY: 25.0000 ug | PREFILLED_SYRINGE | INTRAMUSCULAR | Status: DC | PRN

## 2023-04-24 MED ORDER — GLYCOPYRROLATE 0.2 MG/ML IJ SOLN: 0.3000 mg | Freq: Three times a day (TID) | INTRAMUSCULAR | Status: DC

## 2023-04-24 MED ORDER — MIDAZOLAM HCL 2 MG/2ML IJ SOLN: 2.0000 mg | INTRAMUSCULAR | Status: DC | PRN

## 2023-04-24 MED ORDER — SODIUM CHLORIDE 0.9 % IV SOLN: INTRAVENOUS | Status: DC

## 2023-04-24 MED ORDER — ONDANSETRON 4 MG PO TBDP: 4.0000 mg | ORAL_TABLET | Freq: Four times a day (QID) | ORAL | Status: DC | PRN

## 2023-04-24 MED ORDER — MORPHINE 100MG IN NS 100ML (1MG/ML) PREMIX INFUSION
1.0000 mg/h | INTRAVENOUS | Status: DC
  Administered 2023-04-24: 1 mg/h via INTRAVENOUS
  Filled 2023-04-24: qty 100

## 2023-04-24 MED ORDER — DIPHENHYDRAMINE HCL 50 MG/ML IJ SOLN: 25.0000 mg | INTRAMUSCULAR | Status: DC | PRN

## 2023-04-24 MED ORDER — ACETAMINOPHEN 650 MG RE SUPP: 650.0000 mg | Freq: Four times a day (QID) | RECTAL | Status: DC | PRN

## 2023-04-24 MED ORDER — POLYVINYL ALCOHOL 1.4 % OP SOLN: 1.0000 [drp] | Freq: Four times a day (QID) | OPHTHALMIC | Status: DC | PRN

## 2023-04-24 MED ORDER — SODIUM CHLORIDE 0.9 % IV SOLN
2.0000 g | Freq: Once | INTRAVENOUS | Status: AC
  Administered 2023-04-24: 2 g via INTRAVENOUS
  Filled 2023-04-24: qty 20

## 2023-04-24 NOTE — Progress Notes (Signed)
Dr. Francine Graven in patients room to speak with patients family. Nurse dicussed with him the need for more oxygen on the Nasal Cannula from 2L to 4L. Physician added nebulizer treatments and nasopharyngeal suctioning as necessary. Physician also shut off the 3% hypertonic saline. Will place on back on if sodium falls below 150

## 2023-04-24 NOTE — Procedures (Addendum)
Patient Name: REHAN HINDMON  MRN: [id number]  Epilepsy Attending: Charlsie Quest  Referring Physician/Provider: Lisbeth Renshaw, MD  Duration: 04/23/2023 2053 to 04/24/2023 1434   Patient history: 89yo M with h/o syncope. CTH showed SAH, SDH and EDH. EEG to evaluate for seizure.   Level of alertness: Awake, asleep   AEDs during EEG study: LEV   Technical aspects: This EEG study was done with scalp electrodes positioned according to the 10-20 International system of electrode placement. Electrical activity was reviewed with band pass filter of 1-70Hz , sensitivity of 7 uV/mm, display speed of 8mm/sec with a 60Hz  notched filter applied as appropriate. EEG data were recorded continuously and digitally stored.  Video monitoring was available and reviewed as appropriate.   Description: The posterior dominant rhythm consists of 8-9 Hz activity of moderate voltage (25-35 uV) seen predominantly in posterior head regions, symmetric and reactive to eye opening and eye closing. Sleep was characterized by vertex waves, sleep spindles (12 to 14 Hz), maximal frontocentral region. EEG showed continuous 3 to 6 Hz theta-delta slowing in right temporal region. Intermittent generalized 3-6hz  theta-delta slowing was also noted.  Hyperventilation and photic stimulation were not performed.      ABNORMALITY - Continuous slow, right temporal region - Intermittent slow, generalized   IMPRESSION: This study is suggestive of cortical dysfunction arising from right temporal region likely secondary to underlying structural abnormality. Additionally there is mild to moderate diffuse encephalopathy. No seizures or epileptiform discharges were seen throughout the recording.   Dayonna Selbe Annabelle Harman

## 2023-04-24 NOTE — Progress Notes (Signed)
eLink Physician-Brief Progress Note Patient Name: Glenn Ortega DOB: 1934/06/25 MRN: [id number]   Date of Service  04/24/2023  HPI/Events of Note  Pt with HTN 182/104 (128) even after getting PRN Labetolol 10 mg Q2.  eICU Interventions  Hydralazine x 1. HR is 92.  If this also does not help then will start Cardene drip. Order placed      Intervention Category Major Interventions: Intracranial hypertension - evaluation and management  Shady Bradish G Kaiah Hosea 04/24/2023, 3:11 AM

## 2023-04-24 NOTE — Progress Notes (Signed)
SLP Cancellation Note  Patient Details Name: Glenn Ortega MRN: [id number] DOB: May 26, 1934   Cancelled treatment:       Reason Eval/Treat Not Completed: Fatigue/lethargy limiting ability to participate (Pt's case discussed with [name], RN who advised that the pt is currently inadequately alert for p.o. trials, but suggested that tomorrow may be better. SLP will therefore follow up on a subsequent date unless contacted sooner due to improvement in arousal.)  Jaycion Treml I. Vear Clock, MS, CCC-SLP Acute Rehabilitation Services Office number [phone]  Scheryl Marten 04/24/2023, 11:38 AM

## 2023-04-24 NOTE — Progress Notes (Addendum)
eLink Physician-Brief Progress Note Patient Name: Glenn Ortega DOB: 07-08-1934 MRN: [id number]   Date of Service  04/24/2023  HPI/Events of Note  89 year old male with a history of syncopal episodes with fall on chronic Eliquis therapy who developed a traumatic right subdural hematoma, subarachnoid hemorrhages and intraparenchymal hematoma with brain compression evidenced on imaging.  Developed progressive hypoxemia and has had difficulty expectorating his secretions.  His mentation was declined and is having limited response to nasotracheal suctioning with no cough and no gag.  I discussed the case with the patient's daughter and son who are primary medical power of attorney.  They stated that he had previously expressed that he would not wish to be on life support and felt that he would be better served as DO NOT RESUSCITATE.  We discussed supportive measures such as high flow nasal cannula, medications, and comfort measures.  They were okay with proceeding with supportive measures for the time being.   eICU Interventions  Discussed with bedside staff, will place the patient on high flow nasal cannula for low level positive pressure.   Continue nasotracheal suctioning as needed  Family is open to transitioning to comfort measures only, but I feel that they would benefit from having another discussion with critical care-would defer this to an in person conversation with the team in the morning.  Patient's daughter would like to be at bedside tonight, but son will not be available till tomorrow morning.  Add glycopyrrolate  Hold oral medications for the time being.   2256 -patient's daughter is at bedside requesting to transition to full comfort measures.  Added as needed Versed/fentanyl/morphine drip orders.  Minimal respiratory distress at this time.  Intervention Category Major Interventions: Respiratory failure - evaluation and management  Glenn Ortega 04/24/2023, 8:34 PM

## 2023-04-24 NOTE — Progress Notes (Addendum)
   Providing Compassionate, Quality Care - Together  NEUROSURGERY PROGRESS NOTE   S: No issues overnight.   O: EXAM:  BP (!) 159/92   Pulse 79   Temp 97.9 F (36.6 C) (Axillary)   Resp (!) 25   Ht 5\' 7"  (1.702 m)   Wt 92.1 kg   SpO2 98%   BMI 31.79 kg/m   Eyes closed, Pupils equally round reactive to light No grimace to pain Localizes right upper extremity, otherwise withdraws to pain Not following commands  ASSESSMENT:  89 y.o. male with   Traumatic subdural hematoma, contusions, subarachnoid hemorrhage L2, L5 superior endplate fracture, age-indeterminate   PLAN: -Guarded prognosis -Continue supportive care -Hypertonic saline, sodium goal 1 50-1 55 -Updated family at bedside  Thank you for allowing me to participate in this patient's care.  Please do not hesitate to call with questions or concerns.   Monia Pouch, DO Neurosurgeon [facility] Cell: [phone]

## 2023-04-24 NOTE — Progress Notes (Signed)
LTM EEG disconnected - no skin breakdown at unhook.  

## 2023-04-24 NOTE — Progress Notes (Signed)
NAME:  Glenn Ortega, MRN:  [id number], DOB:  [birth year], LOS: 2 ADMISSION DATE:  05/09/2023 CONSULTATION DATE:  05/09/2023 REFERRING MD:  Alinda Money - TRH CHIEF COMPLAINT:  Syncope with fall on [facility]  History of Present Illness:  89 year old man who presented to [facility] ED 7/5 after fall at home 2/2 syncope. PMHx significant for HTN, HLD, AFib (on Eliquis), OSA, hypothyroidism, GERD, lumbar spinal stenosis.  Patient initially presented to [facility] 7/5 after a fall at home, likely 2/2 syncopal event. Noted "knot"/lump on the back of his head, on blood thinners for AFib. Initial CT Head was negative for AICA and patient was discharged home. Patient unfortunately had another syncopal event at home after discharge for which EMS was called; patient was reportedly not feeling well, went to the ground and then became unresponsive. On ED arrival, patient reported nausea, denied CP/SOB. Was breathing/did have a pulse. Confused on arrival and reportedly did not remember falling.   On ED arrival, patient was afebrile, bradycardic in 50s, hypertensive with SBP 160-170s, SpO2 99% on RA. Increasingly less responsive. Labs were notable for normal CBC, Na 140, K 4.0, CO2 19, BUN/Cr 15/1.18 (baseline), LFTs WNL with exception of Tbili 1.6. LA 1.9. Trop 15 > 16. INR 1.4. Repeat CT Head in ED 7/5 with SDH 8mm and bilateral SAH. CTA without aneurysm/LVO. NSGY was consulted and recommended AC reversal, Andexxa was administered in the ED. Trauma was consulted as well (signed off as event r/t syncope).  PCCM consulted for ICU admission.  Pertinent Medical History:   Past Medical History:  Diagnosis Date   Atrial fibrillation (HCC)    Back pain    GERD (gastroesophageal reflux disease)    Hyperlipidemia    Hypertension    EKG and chest x ray 8/12 [facility]/states had stress test 2 yrs ago- doesnt remember where- not in EPIC   Spinal stenosis    lumbar   Thyroid trouble    Significant Hospital Events: Including procedures, antibiotic  start and stop dates in addition to other pertinent events   7/5 - Presented to [facility] for syncope/fall on [facility]. CT Head NAICA. Discharged home. Recurrent syncopal episode/fall at home with decreased responsiveness. BIB EMS for reevaluation. CT Head with right SDH 8mm, bilateral SAH. NSGY consulted, Andexxa given. PCCM consulted for ICU admission. 7/6 hypertonic saline protocol started  Interim History / Subjective:   No seizures on cEEG Given hydralazine overnight for HTN Tmax 102.83F in past 24 hours Na 149  Objective:  Blood pressure (!) 156/81, pulse 74, temperature 97.9 F (36.6 C), temperature source Axillary, resp. rate (!) 25, height 5\' 7"  (1.702 m), weight 92.1 kg, SpO2 98 %.        Intake/Output Summary (Last 24 hours) at 04/24/2023 0756 Last data filed at 04/24/2023 0700 Gross per 24 hour  Intake 1610.43 ml  Output 1150 ml  Net 460.43 ml   Filed Weights   05/03/2023 1250  Weight: 92.1 kg   Physical Examination: General: Acutely ill-appearing elderly man in NAD HEENT: Posterior R scalp hematoma, no laceration. Anicteric sclera, PERRL Neuro: PERRL. Withdraws to pain in all 4 extremities. Not following commands.  CV: Irregularly irregular rhythm, rate 70s-90s, no m/g/r. PULM: Breathing even and unlabored. Lung fields CTAB. GI: Soft, nontender, nondistended. Normoactive bowel sounds. Extremities: No LE edema noted. Skin: Warm/dry, no rashes, scattered small areas of ecchymosis.  Resolved Hospital Problem List:    Assessment & Plan:  Right subdural hematoma and bilateral subarachnoid hemorrhage and multifocal intraparenchymal hematoma with  brain compression S/p syncopal episode x 2 with fall Presented to [facility] for syncope/fall on AC. CT Head NAICA. Discharged home. Recurrent syncopal episode/fall at home with decreased responsiveness. BIB EMS for reevaluation. CT Head with right SDH 8mm, bilateral SAH. - NSGY consulted, appreciate recommendations - S/p Andexxa AC reversal -  Keppra for seizure ppx - F/u EEG, no seizure activity noted to date - Repeat CT head on 7/5 with new multifocal intraparenchymal hematomas and midline shift - start hypertonic saline protocol, goal Na 150-155 - Frequent neurochecks - Neuroprotective measures: HOB > 30 degrees, normoglycemia, normothermia, electrolytes WNL - High risk for intubation if mental status continues to worsen - hypertonic saline protocol, goal Na 150-155. Increase hypertonic saline to 59mL/hr. Q6hr Na checks  Atrial fibrillation, on Eliquis - Cardiac monitoring - Optimize electrolytes for K > 4, Mg > 2 - Hold AC at present, given acute SDH/SAH - F/u Echo  HTN HLD - Resume home antihypertensives when clinically appropriate - Resume statin  OSA - Supplemental O2 support as needed for O2 sat > 90% - CPAP PRN - Pulmonary hygiene  GERD - PPI  Hypothyroidism - Continue levothyroxine  Best Practice: (right click and "Reselect all SmartList Selections" daily)   Diet/type: NPO, cortrak tomorrow for TF DVT prophylaxis: SCDs, AC contraindicated in the setting of SDH/SAH GI prophylaxis: PPI Lines: N/A Foley:  N/A Code Status:  full code Last date of multidisciplinary goals of care discussion [family updated 7/7 at bedside by neurosurgery]  Labs:  CBC: Recent Labs  Lab 04/21/2023 0910 04/24/2023 1249 04/23/23 0202  WBC 8.3 9.8 13.3*  NEUTROABS 5.7 6.6  --   HGB 14.3 14.4 13.7  HCT 43.3 43.3 39.7  MCV 85.6 89.5 84.5  PLT 163 157 162   Basic Metabolic Panel: Recent Labs  Lab 05/12/2023 0910 04/21/2023 1249 04/23/23 0202 04/23/23 1250 04/23/23 1806 04/23/23 2356 04/24/23 0600  NA 139 140 137 140 146* 151* 149*  K 4.4 4.0 3.8  --   --   --   --   CL 107 109 106  --   --   --   --   CO2 22 19* 21*  --   --   --   --   GLUCOSE 94 113* 158*  --   --   --   --   BUN 17 15 15   --   --   --   --   CREATININE 0.96 1.18 1.07  --   --   --   --   CALCIUM 8.5* 8.5* 8.5*  --   --   --   --   MG  --  1.9  1.9  --   --   --   --   PHOS  --   --  3.0  --   --   --   --    GFR: Estimated Creatinine Clearance: 53.6 mL/min (by C-G formula based on SCr of 1.07 mg/dL). Recent Labs  Lab 04/24/2023 0910 04/21/2023 1249 05/09/2023 1717 04/23/23 0202  WBC 8.3 9.8  --  13.3*  LATICACIDVEN  --  1.9 2.5*  --    Liver Function Tests: Recent Labs  Lab 04/18/2023 0910 05/13/2023 1249 04/23/23 0202  AST 23 26 20   ALT 12 16 15   ALKPHOS 44 44 39  BILITOT 1.6* 1.6* 1.5*  PROT 7.2 6.7 6.5  ALBUMIN 3.9 3.7 3.5   Recent Labs  Lab 05/01/2023 0910  LIPASE 45   No results  for input(s): "AMMONIA" in the last 168 hours.    Critical care time:    The patient is critically ill with multiple organ system failure and requires high complexity decision making for assessment and support, frequent evaluation and titration of therapies, advanced monitoring, review of radiographic studies and interpretation of complex data.   Critical Care Time devoted to patient care services, exclusive of separately billable procedures, described in this note is 35 minutes.  Melody Comas, MD [facility] Office: [phone]   See Amion for personal pager PCCM on call pager [phone] until 7pm. Please call Elink 7p-7a. [phone]

## 2023-04-24 NOTE — Progress Notes (Addendum)
PT Cancellation Note  Patient Details Name: DEMICO DREWETT MRN: [id number] DOB: [birth year]   Cancelled Treatment:    Reason Eval/Treat Not Completed: Patient's level of consciousness  Spoke with RN and pt currently lethargic. Reports he was more alert later in the day yesterday. Will reattempt in pm as schedule permits.   Addendum (1324)-RN reached out to say pt is still too lethargic to participate with PT evaluation. Will continue to monitor for appropriateness.   Jerolyn Center, PT Acute Rehabilitation Services  Office [phone]  Zena Amos 04/24/2023, 8:19 AM

## 2023-04-24 NOTE — Progress Notes (Signed)
Patient NT suctioned for large amount of thick tan, blood tinged secretions and one large old bloody plug. Prior to suction, patient sats were in the low 80s on 100% NRB. After suction, sats increased to 95%. Vitals stable throughout procedure. RN at bedside and MD notified that patient is minimally responsive. Awaiting orders. HR 104, 125/80, RR 20

## 2023-04-24 NOTE — Progress Notes (Signed)
Spoke with RN and patient has now been made DNR. RN placed patient on 15L salter HFNC, sat 95%. Since we are not escalating care, we are not obtaining ABG at this time.  Nelda Marseille

## 2023-04-25 ENCOUNTER — Ambulatory Visit: Payer: PPO | Admitting: Sports Medicine

## 2023-05-19 NOTE — Progress Notes (Signed)
Time of death 0530. Announced with Varney Daily, RN. MD notified.

## 2023-05-19 NOTE — Death Summary Note (Signed)
DEATH SUMMARY   Patient Details  Name: Glenn Ortega MRN: [id number] DOB: 1934/04/14  Admission/Discharge Information   Admit Date:  2023-05-12  Date of Death: Date of Death: 05/15/23  Time of Death: Time of Death: 0530  Length of Stay: 3  Referring Physician: Agapito Games, MD   Reason(s) for Hospitalization  Right Subdural Hematoma Bilateral Subarachnoid Hemorrhage  Diagnoses  Preliminary cause of death:  Subarachnoid hemorrhage  Secondary Diagnoses (including complications and co-morbidities):  Principal Problem:   Syncope Active Problems:   HYPERTENSION, BENIGN   Esophageal reflux   BPH (benign prostatic hyperplasia)   Mixed hyperlipidemia   AF (paroxysmal atrial fibrillation) (HCC)   BMI 38.0-38.9,adult   Hypothyroidism   Sleep apnea   Syncope and collapse   Traumatic encephalopathy   Subarachnoid hemorrhage (HCC)   Subdural hematoma (HCC)   Traumatic subarachnoid hemorrhage with loss of consciousness of 30 minutes or less [facility])   Brief Hospital Course (including significant findings, care, treatment, and services provided and events leading to death)  Glenn Ortega is a 89 y.o. year old male who presented to [facility] ED May 12, 2023 after fall at home 2/2 syncope. PMHx significant for HTN, HLD, AFib (on Eliquis), OSA, hypothyroidism, GERD, lumbar spinal stenosis. On ED arrival, patient was afebrile, bradycardic in 50s, hypertensive with SBP 160-170s, SpO2 99% on RA. Increasingly less responsive. Labs were notable for normal CBC, Na 140, K 4.0, CO2 19, BUN/Cr 15/1.18 (baseline), LFTs WNL with exception of Tbili 1.6. LA 1.9. Trop 15 > 16. INR 1.4. Repeat CT Head in ED May 12, 2023 with SDH 8mm and bilateral SAH. CTA without aneurysm/LVO. NSGY was consulted and recommended AC reversal, Andexxa was administered in the ED. Trauma was consulted as well (signed off as event r/t syncope). He was admitted to the ICU for close monitoring of his neurologic status. Repeat CT head on 05/12/2023 with new  multifocal intraparenchymal hematomas and midline shift. He was started on hypertonic saline protocol. He developed progressive respiratory failure and distress the night of 7/7 to 7/8. Decision was made to transition him to comfort care and he died at 0530 on 05/15/2023.   Pertinent Labs and Studies  Significant Diagnostic Studies DG CHEST PORT 1 VIEW  Result Date: 04/24/2023 CLINICAL DATA:  Hypoxia. EXAM: PORTABLE CHEST 1 VIEW COMPARISON:  04/23/2023. FINDINGS: The heart size and mediastinal contours are stable. There is atherosclerotic calcification of the aorta. Mild airspace disease is noted at the left lung base. No effusion or pneumothorax. A right PICC line appears stable in position. No acute osseous abnormality. IMPRESSION: Mild airspace disease at the left lung base, possible atelectasis, edema, or infiltrate. Electronically Signed   By: Thornell Sartorius M.D.   On: 04/24/2023 20:58   DG CHEST PORT 1 VIEW  Result Date: 04/23/2023 CLINICAL DATA:  PICC placement EXAM: PORTABLE CHEST 1 VIEW COMPARISON:  05/12/2023 FINDINGS: Cardiomegaly. Right upper extremity PICC, tip positioned over the superior vena cava. Diffuse bilateral interstitial pulmonary opacity. The visualized skeletal structures are unremarkable. IMPRESSION: 1. Right upper extremity PICC, tip positioned over the superior vena cava. 2. Cardiomegaly with diffuse bilateral interstitial pulmonary opacity, consistent with edema or atypical/viral infection. Electronically Signed   By: Jearld Lesch M.D.   On: 04/23/2023 15:00   [place] EKG SITE RITE  Result Date: 04/23/2023 If Site Rite image not attached, placement could not be confirmed due to current cardiac rhythm.  Overnight EEG with video  Result Date: 04/23/2023 Charlsie Quest, MD     04/24/2023  7:56 AM Patient Name: Glenn Ortega MRN: [id number] Epilepsy Attending: Charlsie Quest Referring Physician/Provider: Lisbeth Renshaw, MD Duration: 04/20/2023 2053 to 04/23/2023 2053 Patient  history: 89yo M with h/o syncope. CTH showed SAH, SDH and EDH. EEG to evaluate for seizure. Level of alertness: Awake, asleep AEDs during EEG study: LEV Technical aspects: This EEG study was done with scalp electrodes positioned according to the 10-20 International system of electrode placement. Electrical activity was reviewed with band pass filter of 1-70Hz , sensitivity of 7 uV/mm, display speed of 20mm/sec with a 60Hz  notched filter applied as appropriate. EEG data were recorded continuously and digitally stored.  Video monitoring was available and reviewed as appropriate. Description: The posterior dominant rhythm consists of 8-9 Hz activity of moderate voltage (25-35 uV) seen predominantly in posterior head regions, symmetric and reactive to eye opening and eye closing. Sleep was characterized by vertex waves, sleep spindles (12 to 14 Hz), maximal frontocentral region. EEG showed continuous 3 to 6 Hz theta-delta slowing in right temporal region. Intermittent generalized 3-6hz  theta-delta slowing was also noted.  Hyperventilation and photic stimulation were not performed.   ABNORMALITY - Continuous slow, right temporal region - Intermittent slow, generalized IMPRESSION: This study is suggestive of cortical dysfunction arising from right temporal region likely secondary to underlying structural abnormality. Additionally there is mild to moderate diffuse encephalopathy. No seizures or epileptiform discharges were seen throughout the recording. Priyanka Annabelle Harman   CT HEAD WO CONTRAST ( )  Result Date: 05/09/2023 CLINICAL DATA:  Subarachnoid hemorrhage follow up EXAM: CT HEAD WITHOUT CONTRAST TECHNIQUE: Contiguous axial images were obtained from the base of the skull through the vertex without intravenous contrast. RADIATION DOSE REDUCTION: This exam was performed according to the departmental dose-optimization program which includes automated exposure control, adjustment of the mA and/or kV according to patient  size and/or use of iterative reconstruction technique. COMPARISON:  None Available. FINDINGS: Brain: There is now multifocal intraparenchymal hematoma within both temporal lobes and the right frontal lobe. The largest area measures 3.8 x 2.9 cm. There is leftward midline shift of 4 mm. The basal cisterns are effaced. There is also now a 6mm right convexity subdural hematoma. Persistent biconvexity subarachnoid blood. Vascular: Carotid calcification Skull: Normal. Negative for fracture or focal lesion. Sinuses/Orbits: Left mastoid fluid Other: None. IMPRESSION: 1. Multifocal intraparenchymal hematoma within both temporal lobes and the right frontal lobe, measuring up to 3.8 x 2.9 cm. 2. Leftward midline shift of 4 mm with effacement of the basal cisterns. 3. New 6 mm right convexity subdural hematoma. 4. Persistent biconvexity subarachnoid blood. Critical Value/emergent results were called by telephone at the time of interpretation on 05/09/2023 at 10:26 pm to provider [facility] , who verbally acknowledged these results. Electronically Signed   By: Deatra Robinson M.D.   On: 05/17/2023 22:27   CT CHEST ABDOMEN PELVIS W CONTRAST  Result Date: 05/12/2023 CLINICAL DATA:  Fall, on Eliquis, hematemesis EXAM: CT CHEST, ABDOMEN, AND PELVIS WITH CONTRAST TECHNIQUE: Multidetector CT imaging of the chest, abdomen and pelvis was performed following the standard protocol during bolus administration of intravenous contrast. RADIATION DOSE REDUCTION: This exam was performed according to the departmental dose-optimization program which includes automated exposure control, adjustment of the mA and/or kV according to patient size and/or use of iterative reconstruction technique. CONTRAST:  75mL OMNIPAQUE IOHEXOL 350 MG/ML SOLN COMPARISON:  None Available. FINDINGS: CT CHEST FINDINGS Cardiovascular: Aortic atherosclerosis. Cardiomegaly. Left coronary artery calcifications. No pericardial effusion. Mediastinum/Nodes: No enlarged  mediastinal, hilar, or axillary lymph  nodes. Fluid-filled esophagus. Thyroid gland and trachea demonstrate no significant findings Lungs/Pleura: Dependent bibasilar scarring or atelectasis. No pleural effusion or pneumothorax. Musculoskeletal: No chest wall abnormality. No acute osseous findings. CT ABDOMEN PELVIS FINDINGS Hepatobiliary: No solid liver abnormality is seen. Hepatic steatosis. No gallstones, gallbladder wall thickening, or biliary dilatation. Pancreas: Unremarkable. No pancreatic ductal dilatation or surrounding inflammatory changes. Spleen: Normal in size without significant abnormality. Adrenals/Urinary Tract: Adrenal glands are unremarkable. Simple, benign bilateral renal cortical cysts and renal cortical scarring. No calculi or hydronephrosis. Bladder is unremarkable. Stomach/Bowel: Stomach is within normal limits. Appendix appears normal. No evidence of bowel wall thickening, distention, or inflammatory changes. Sigmoid diverticula Vascular/Lymphatic: Aortic atherosclerosis. No enlarged abdominal or pelvic lymph nodes. Reproductive: No mass.  Status post TURP. Other: Status post bilateral inguinal hernia repair.  No ascites. Musculoskeletal: Age-indeterminate superior endplate deformities of L2 and L5 (series 8, image 82). IMPRESSION: 1. Age-indeterminate superior endplate deformities of L2 and L5. Correlate for point tenderness. 2. No other CT evidence of acute traumatic injury to the chest, abdomen, or pelvis. 3. Fluid-filled esophagus, consistent with reported hematemesis. No specific findings to localize source of hemorrhage. 4. Hepatic steatosis. 5. Coronary artery disease. Aortic Atherosclerosis (ICD10-I70.0). Electronically Signed   By: Jearld Lesch M.D.   On: 05/18/2023 15:43   CT Cervical Spine Wo Contrast  Result Date: 05/18/2023 CLINICAL DATA:  Head trauma, abnormal mental status (Age 58-64y); Polytrauma, blunt EXAM: CT HEAD WITHOUT CONTRAST CT CERVICAL SPINE WITHOUT CONTRAST  TECHNIQUE: Multidetector CT imaging of the head and cervical spine was performed following the standard protocol without intravenous contrast. Multiplanar CT image reconstructions of the cervical spine were also generated. RADIATION DOSE REDUCTION: This exam was performed according to the departmental dose-optimization program which includes automated exposure control, adjustment of the mA and/or kV according to patient size and/or use of iterative reconstruction technique. COMPARISON:  CT Head 05/10/2023 FINDINGS: CT HEAD FINDINGS Brain: There is a new 8 mm right parietal convexity subdural hematoma. Is also subarachnoid hemorrhage in the prepontine cisterns extending into the bilateral sylvian fissures as well as the bilateral frontal lobe sulci. Small volume subdural hematoma is also present along the cerebral falx. No CT evidence of an acute cortical infarct. No hydrocephalus. Vascular: No hyperdense vessel or unexpected calcification. Skull: Redemonstrated soft tissue hematoma along the right parietal scalp. Compared to prior exam there is a new soft tissue hematoma along the left occipital scalp (series 3, image 24). Compared to prior exam there is a new nondisplaced fracture through the left temporal bone at the level of the sigmoid plate (cervical spine series 7, image 50). There is small volume pneumocephalus in this region. This is also in the region of the sigmoid/transverse sinus junction, which raises the possibility for a dural venous sinus injury. Sinuses/Orbits: There is a left-sided mastoid effusion as well as the middle ear effusion. Other: None CT CERVICAL SPINE FINDINGS Alignment: Normal. Skull base and vertebrae: No acute fracture. No primary bone lesion or focal pathologic process. Soft tissues and spinal canal: No prevertebral fluid or swelling. No visible canal hematoma. Disc levels:  Evidence of high-grade spinal canal stenosis. Upper chest: Incompletely imaged Other: None IMPRESSION: 1. New 8  mm right parietal convexity subdural hematoma. 2. New subarachnoid hemorrhage in the prepontine cisterns extending into the bilateral Sylvian fissures as well as the bilateral frontal lobe sulci. While this may be posttraumatic, further evaluation with a CTA head and neck angiogram is recommended to exclude the possibility of aneurysmal subarachnoid hemorrhage.  3. New nondisplaced fracture through the left temporal bone at the level of the sigmoid plate. This is also in the region of the sigmoid/transverse sinus junction, which raises the possibility for a dural venous sinus injury. Recommend further evaluation with a CT of the temporal bone and a CTV of the head. 4. New soft tissue hematoma along the left occipital scalp. 5. Redemonstrated soft tissue hematoma along the right parietal scalp. 6. No acute cervical spine fracture. Findings were discussed with Dr. Hyacinth Meeker on 05/18/2023 at 1:25 PM. Electronically Signed   By: Lorenza Cambridge M.D.   On: 04/21/2023 13:52   CT Head Wo Contrast  Result Date: 05/11/2023 CLINICAL DATA:  Head trauma, abnormal mental status (Age 22-64y); Polytrauma, blunt EXAM: CT HEAD WITHOUT CONTRAST CT CERVICAL SPINE WITHOUT CONTRAST TECHNIQUE: Multidetector CT imaging of the head and cervical spine was performed following the standard protocol without intravenous contrast. Multiplanar CT image reconstructions of the cervical spine were also generated. RADIATION DOSE REDUCTION: This exam was performed according to the departmental dose-optimization program which includes automated exposure control, adjustment of the mA and/or kV according to patient size and/or use of iterative reconstruction technique. COMPARISON:  CT Head 05/02/2023 FINDINGS: CT HEAD FINDINGS Brain: There is a new 8 mm right parietal convexity subdural hematoma. Is also subarachnoid hemorrhage in the prepontine cisterns extending into the bilateral sylvian fissures as well as the bilateral frontal lobe sulci. Small volume subdural  hematoma is also present along the cerebral falx. No CT evidence of an acute cortical infarct. No hydrocephalus. Vascular: No hyperdense vessel or unexpected calcification. Skull: Redemonstrated soft tissue hematoma along the right parietal scalp. Compared to prior exam there is a new soft tissue hematoma along the left occipital scalp (series 3, image 24). Compared to prior exam there is a new nondisplaced fracture through the left temporal bone at the level of the sigmoid plate (cervical spine series 7, image 50). There is small volume pneumocephalus in this region. This is also in the region of the sigmoid/transverse sinus junction, which raises the possibility for a dural venous sinus injury. Sinuses/Orbits: There is a left-sided mastoid effusion as well as the middle ear effusion. Other: None CT CERVICAL SPINE FINDINGS Alignment: Normal. Skull base and vertebrae: No acute fracture. No primary bone lesion or focal pathologic process. Soft tissues and spinal canal: No prevertebral fluid or swelling. No visible canal hematoma. Disc levels:  Evidence of high-grade spinal canal stenosis. Upper chest: Incompletely imaged Other: None IMPRESSION: 1. New 8 mm right parietal convexity subdural hematoma. 2. New subarachnoid hemorrhage in the prepontine cisterns extending into the bilateral Sylvian fissures as well as the bilateral frontal lobe sulci. While this may be posttraumatic, further evaluation with a CTA head and neck angiogram is recommended to exclude the possibility of aneurysmal subarachnoid hemorrhage. 3. New nondisplaced fracture through the left temporal bone at the level of the sigmoid plate. This is also in the region of the sigmoid/transverse sinus junction, which raises the possibility for a dural venous sinus injury. Recommend further evaluation with a CT of the temporal bone and a CTV of the head. 4. New soft tissue hematoma along the left occipital scalp. 5. Redemonstrated soft tissue hematoma along  the right parietal scalp. 6. No acute cervical spine fracture. Findings were discussed with Dr. Hyacinth Meeker on 05/13/2023 at 1:25 PM. Electronically Signed   By: Lorenza Cambridge M.D.   On: 04/21/2023 13:52   CT ANGIO HEAD NECK W WO CM  Result Date: 05/02/2023 CLINICAL  DATA:  Trauma EXAM: CT ANGIOGRAPHY HEAD AND NECK WITH AND WITHOUT CONTRAST TECHNIQUE: Multidetector CT imaging of the head and neck was performed using the standard protocol during bolus administration of intravenous contrast. Multiplanar CT image reconstructions and MIPs were obtained to evaluate the vascular anatomy. Carotid stenosis measurements (when applicable) are obtained utilizing NASCET criteria, using the distal internal carotid diameter as the denominator. RADIATION DOSE REDUCTION: This exam was performed according to the departmental dose-optimization program which includes automated exposure control, adjustment of the mA and/or kV according to patient size and/or use of iterative reconstruction technique. CONTRAST:  75mL OMNIPAQUE IOHEXOL 350 MG/ML SOLN COMPARISON:  Same-day CT head and CT cervical spine FINDINGS: CT HEAD FINDINGS See same day CT head for intracranial findings. CTA NECK FINDINGS Aortic arch: Standard branching. Imaged portion shows no evidence of aneurysm or dissection. No significant stenosis of the major arch vessel origins. Right carotid system: No evidence of dissection, stenosis (50% or greater), or occlusion. Left carotid system: No evidence of dissection, stenosis (50% or greater), or occlusion. Vertebral arteries: Codominant. No evidence of dissection, stenosis (50% or greater), or occlusion. Skeleton: See same-day cervical spine CT. Other neck: Negative. Upper chest: There is large moderate debris in the visualized thoracic esophagus. This puts the patient at risk for aspiration. Review of the MIP images confirms the above findings CTA HEAD FINDINGS Anterior circulation: No significant stenosis, proximal occlusion,  aneurysm, or vascular malformation. Posterior circulation: No significant stenosis, proximal occlusion, aneurysm, or vascular malformation. Venous sinuses: There is likely a small venous epidural hematoma along the left sigmoid plate (series 8, 170). Anatomic variants: None Review of the MIP images confirms the above findings IMPRESSION: 1. No evidence of an intracranial aneurysm. 2. No evidence of acute traumatic injury in the neck. 3. No intracranial large vessel occlusion or significant stenosis. 4. There is likely a small venous epidural hematoma along the left sigmoid plate. 5. There is debris in the visualized thoracic esophagus. This puts the patient at risk for aspiration. Findings were discussed with Dr. Hyacinth Meeker on 05/11/2023 at 1:51 PM. Electronically Signed   By: Lorenza Cambridge M.D.   On: 05/18/2023 13:52   DG Chest Port 1 View  Result Date: 05/14/2023 CLINICAL DATA:  Altered mental status EXAM: PORTABLE CHEST 1 VIEW COMPARISON:  05/02/2023 FINDINGS: No consolidation, pneumothorax or effusion. Underinflated. Enlarged cardiopericardial silhouette with some prominence of the central vasculature. Overlapping cardiac leads. Film is rotated to the left. Degenerative changes of the spine IMPRESSION: Underinflation. Enlarged heart with some central vascular congestion. Electronically Signed   By: Karen Kays M.D.   On: 04/27/2023 13:07   DG Chest Port 1 View  Result Date: 04/21/2023 CLINICAL DATA:  syncope, hiccups EXAM: PORTABLE CHEST 1 VIEW COMPARISON:  CXR 06/28/21 FINDINGS: No pleural effusion. No pneumothorax. No focal airspace opacity. Cardiomegaly. Compared to prior exam there is increased prominence of the right hilum, which may be secondary to pulmonary venous congestion or atypical infection. No radiographically apparent displaced rib fractures. Visualized upper abdomen is unremarkable. IMPRESSION: Increased prominence of the right hilum, which may be secondary to pulmonary venous congestion or  atypical infection. If clinically indicated, this could be further assessed with a chest CT. Otherwise recommend follow-up chest radiograph in 6-8 weeks to ensure resolution. Electronically Signed   By: Lorenza Cambridge M.D.   On: 05/08/2023 10:09   CT Head Wo Contrast  Result Date: 04/19/2023 CLINICAL DATA:  Provided history: Head trauma, minor. EXAM: CT HEAD WITHOUT CONTRAST TECHNIQUE: Contiguous  axial images were obtained from the base of the skull through the vertex without intravenous contrast. RADIATION DOSE REDUCTION: This exam was performed according to the departmental dose-optimization program which includes automated exposure control, adjustment of the mA and/or kV according to patient size and/or use of iterative reconstruction technique. COMPARISON:  Brain MRI 06/28/2021.  Head CT 06/27/2021. FINDINGS: Brain: Mild generalized cerebral atrophy. Mild chronic small vessel ischemic changes within the cerebral white matter, better appreciated on the prior brain MRI of 06/28/2021. There is no acute intracranial hemorrhage. No demarcated cortical infarct. No extra-axial fluid collection. No evidence of an intracranial mass. No midline shift. Vascular: No hyperdense vessel.  Atherosclerotic calcifications. Skull: No calvarial fracture or aggressive osseous lesion. Sinuses/Orbits: No mass or acute finding within the imaged orbits. No significant paranasal sinus disease. Other: Right parietal scalp hematoma. Prior left parotidectomy. Indeterminate 11 mm cystic lesion within the parotidectomy bed, unchanged in size from the prior brain MRI of 06/28/2021. IMPRESSION: 1.  No evidence of an acute intracranial abnormality. 2. Right parietal scalp hematoma. 3. Mild chronic small vessel ischemic disease within the cerebral white matter. 4. Mild generalized cerebral atrophy. 5. Prior left parotidectomy. Indeterminate 11 mm cystic lesion within the parotidectomy bed, unchanged in size from the prior brain MRI of  06/28/2021. Please refer to the prior brain MRI for further description and for recommendations. Electronically Signed   By: Jackey Loge D.O.   On: 05/17/2023 10:05   DG INJECT DIAG/THERA/INC NEEDLE/CATH/PLC EPI/LUMB/SAC W/IMG  Result Date: 04/11/2023 CLINICAL DATA:  Lumbosacral spondylosis without myelopathy. Low back and right hip pain. Spinal stenosis at L1-2. FLUOROSCOPY: Radiation Exposure Index (as provided by the fluoroscopic device): 5.10 mGy Kerma PROCEDURE: The procedure, risks, benefits, and alternatives were explained to the patient. Questions regarding the procedure were encouraged and answered. The patient understands and consents to the procedure. LUMBAR EPIDURAL INJECTION: An interlaminar approach was performed on the right at L1-2. The overlying skin was cleansed and anesthetized. A 3.5 inch 20 gauge epidural needle was advanced using loss-of-resistance technique. DIAGNOSTIC EPIDURAL INJECTION Injection of Isovue-M 200 shows a good epidural pattern with spread above and below the level of needle placement primarily on the right. No vascular opacification is seen. THERAPEUTIC EPIDURAL INJECTION: 80 mg of Depo-Medrol mixed with 3 mL of 1% lidocaine were instilled. The procedure was well-tolerated, and the patient was discharged thirty minutes following the injection in good condition. COMPLICATIONS: None immediate IMPRESSION: Technically successful interlaminar epidural injection on the right at L1-2. Electronically Signed   By: Sebastian Ache M.D.   On: 04/11/2023 10:42    Microbiology Recent Results (from the past 240 hour(s))  MRSA Next Gen by PCR, Nasal     Status: None   Collection Time: 04/26/2023  8:04 PM   Specimen: Nasal Mucosa; Nasal Swab  Result Value Ref Range Status   MRSA by PCR Next Gen NOT DETECTED NOT DETECTED Final    Comment: (NOTE) The GeneXpert MRSA Assay (FDA approved for NASAL specimens only), is one component of a comprehensive MRSA colonization  surveillance program. It is not intended to diagnose MRSA infection nor to guide or monitor treatment for MRSA infections. Test performance is not FDA approved in patients less than 23 years old. Performed at [facility], [address], [place], [place] [address]     Lab Basic Metabolic Panel: Recent Labs  Lab 05/13/2023 0910 05/07/2023 1249 04/23/23 0202 04/23/23 1250 04/23/23 1806 04/23/23 2356 04/24/23 0600 04/24/23 1535 04/24/23 1740  NA  139 140 137   < > 146* 151* 149* 156* 160*  K 4.4 4.0 3.8  --   --   --   --  3.6  --   CL 107 109 106  --   --   --   --  129*  --   CO2 22 19* 21*  --   --   --   --  19*  --   GLUCOSE 94 113* 158*  --   --   --   --  134*  --   BUN 17 15 15   --   --   --   --  21  --   CREATININE 0.96 1.18 1.07  --   --   --   --  1.17  --   CALCIUM 8.5* 8.5* 8.5*  --   --   --   --  8.2*  --   MG  --  1.9 1.9  --   --   --   --  2.1  --   PHOS  --   --  3.0  --   --   --   --  2.9  --    < > = values in this interval not displayed.   Liver Function Tests: Recent Labs  Lab 04/29/2023 0910 05/09/2023 1249 04/23/23 0202  AST 23 26 20   ALT 12 16 15   ALKPHOS 44 44 39  BILITOT 1.6* 1.6* 1.5*  PROT 7.2 6.7 6.5  ALBUMIN 3.9 3.7 3.5   Recent Labs  Lab 04/19/2023 0910  LIPASE 45   No results for input(s): "AMMONIA" in the last 168 hours. CBC: Recent Labs  Lab 04/21/2023 0910 05/14/2023 1249 04/23/23 0202 04/24/23 1206  WBC 8.3 9.8 13.3* 16.0*  NEUTROABS 5.7 6.6  --  13.3*  HGB 14.3 14.4 13.7 11.5*  HCT 43.3 43.3 39.7 35.6*  MCV 85.6 89.5 84.5 87.5  PLT 163 157 162 123*   Cardiac Enzymes: No results for input(s): "CKTOTAL", "CKMB", "CKMBINDEX", "TROPONINI" in the last 168 hours. Sepsis Labs: Recent Labs  Lab 04/26/2023 0910 04/20/2023 1249 05/14/2023 1717 04/23/23 0202 04/24/23 1206  WBC 8.3 9.8  --  13.3* 16.0*  LATICACIDVEN  --  1.9 2.5*  --   --      Martina Sinner 04/18/2023, 1:04 PM

## 2023-05-19 DEATH — deceased

## 2023-09-12 ENCOUNTER — Ambulatory Visit: Payer: PPO | Admitting: Family Medicine

## 2023-11-13 ENCOUNTER — Other Ambulatory Visit: Payer: Self-pay | Admitting: Sports Medicine
